# Patient Record
Sex: Female | Born: 1945
Health system: Southern US, Community
[De-identification: ages and names within clinical notes are randomized; demographics above are authoritative.]

## PROBLEM LIST (undated history)

## (undated) DIAGNOSIS — I8390 Asymptomatic varicose veins of unspecified lower extremity: Secondary | ICD-10-CM

## (undated) DIAGNOSIS — Z8489 Family history of other specified conditions: Secondary | ICD-10-CM

## (undated) DIAGNOSIS — Z9289 Personal history of other medical treatment: Secondary | ICD-10-CM

## (undated) DIAGNOSIS — E785 Hyperlipidemia, unspecified: Secondary | ICD-10-CM

## (undated) DIAGNOSIS — M810 Age-related osteoporosis without current pathological fracture: Secondary | ICD-10-CM

## (undated) DIAGNOSIS — I5189 Other ill-defined heart diseases: Secondary | ICD-10-CM

## (undated) DIAGNOSIS — M1990 Unspecified osteoarthritis, unspecified site: Secondary | ICD-10-CM

## (undated) DIAGNOSIS — I10 Essential (primary) hypertension: Secondary | ICD-10-CM

## (undated) DIAGNOSIS — K219 Gastro-esophageal reflux disease without esophagitis: Secondary | ICD-10-CM

## (undated) DIAGNOSIS — T7840XA Allergy, unspecified, initial encounter: Secondary | ICD-10-CM

## (undated) HISTORY — DX: Personal history of other medical treatment: Z92.89

## (undated) HISTORY — DX: Other ill-defined heart diseases: I51.89

## (undated) HISTORY — PX: FRACTURE SURGERY: SHX138

## (undated) HISTORY — DX: Age-related osteoporosis without current pathological fracture: M81.0

## (undated) HISTORY — PX: TOOTH EXTRACTION: SHX859

## (undated) HISTORY — PX: DILATION AND CURETTAGE OF UTERUS: SHX78

## (undated) HISTORY — PX: ABDOMINAL HYSTERECTOMY: SHX81

## (undated) HISTORY — PX: TUBAL LIGATION: SHX77

## (undated) HISTORY — DX: Hyperlipidemia, unspecified: E78.5

## (undated) HISTORY — PX: BACK SURGERY: SHX140

## (undated) HISTORY — DX: Allergy, unspecified, initial encounter: T78.40XA

---

## 1997-12-08 ENCOUNTER — Other Ambulatory Visit: Admission: RE | Admit: 1997-12-08 | Discharge: 1997-12-08 | Payer: Self-pay | Admitting: Gynecology

## 1998-03-08 ENCOUNTER — Ambulatory Visit (HOSPITAL_COMMUNITY): Admission: RE | Admit: 1998-03-08 | Discharge: 1998-03-08 | Payer: Self-pay | Admitting: Gastroenterology

## 2000-01-22 ENCOUNTER — Other Ambulatory Visit: Admission: RE | Admit: 2000-01-22 | Discharge: 2000-01-22 | Payer: Self-pay | Admitting: Gynecology

## 2000-09-19 ENCOUNTER — Encounter: Admission: RE | Admit: 2000-09-19 | Discharge: 2000-09-19 | Payer: Self-pay | Admitting: Family Medicine

## 2000-09-19 ENCOUNTER — Encounter: Payer: Self-pay | Admitting: Family Medicine

## 2000-09-25 ENCOUNTER — Ambulatory Visit (HOSPITAL_COMMUNITY): Admission: RE | Admit: 2000-09-25 | Discharge: 2000-09-25 | Payer: Self-pay | Admitting: Neurosurgery

## 2000-09-25 ENCOUNTER — Encounter: Payer: Self-pay | Admitting: Neurosurgery

## 2000-09-29 ENCOUNTER — Encounter: Payer: Self-pay | Admitting: Neurosurgery

## 2000-09-29 ENCOUNTER — Inpatient Hospital Stay (HOSPITAL_COMMUNITY): Admission: RE | Admit: 2000-09-29 | Discharge: 2000-10-03 | Payer: Self-pay | Admitting: Neurosurgery

## 2001-05-27 ENCOUNTER — Encounter: Payer: Self-pay | Admitting: Gastroenterology

## 2001-05-27 ENCOUNTER — Encounter: Admission: RE | Admit: 2001-05-27 | Discharge: 2001-05-27 | Payer: Self-pay | Admitting: Gastroenterology

## 2002-02-13 IMAGING — CR DG CHEST 2V
2 series · 2 of 2 positions shown · non-contrast
Comparison: none

CLINICAL DATA: Patient has a cough and bronchitis.
 CHEST, TWO VIEWS
 PA and lateral views reveal the heart size to be normal.  Markings are accentuated, particularly in the left base, without active findings.  Old healed fracture noted of the right clavicle. 
 IMPRESSION
 No active disease.

[view not recorded (1 of 2)]
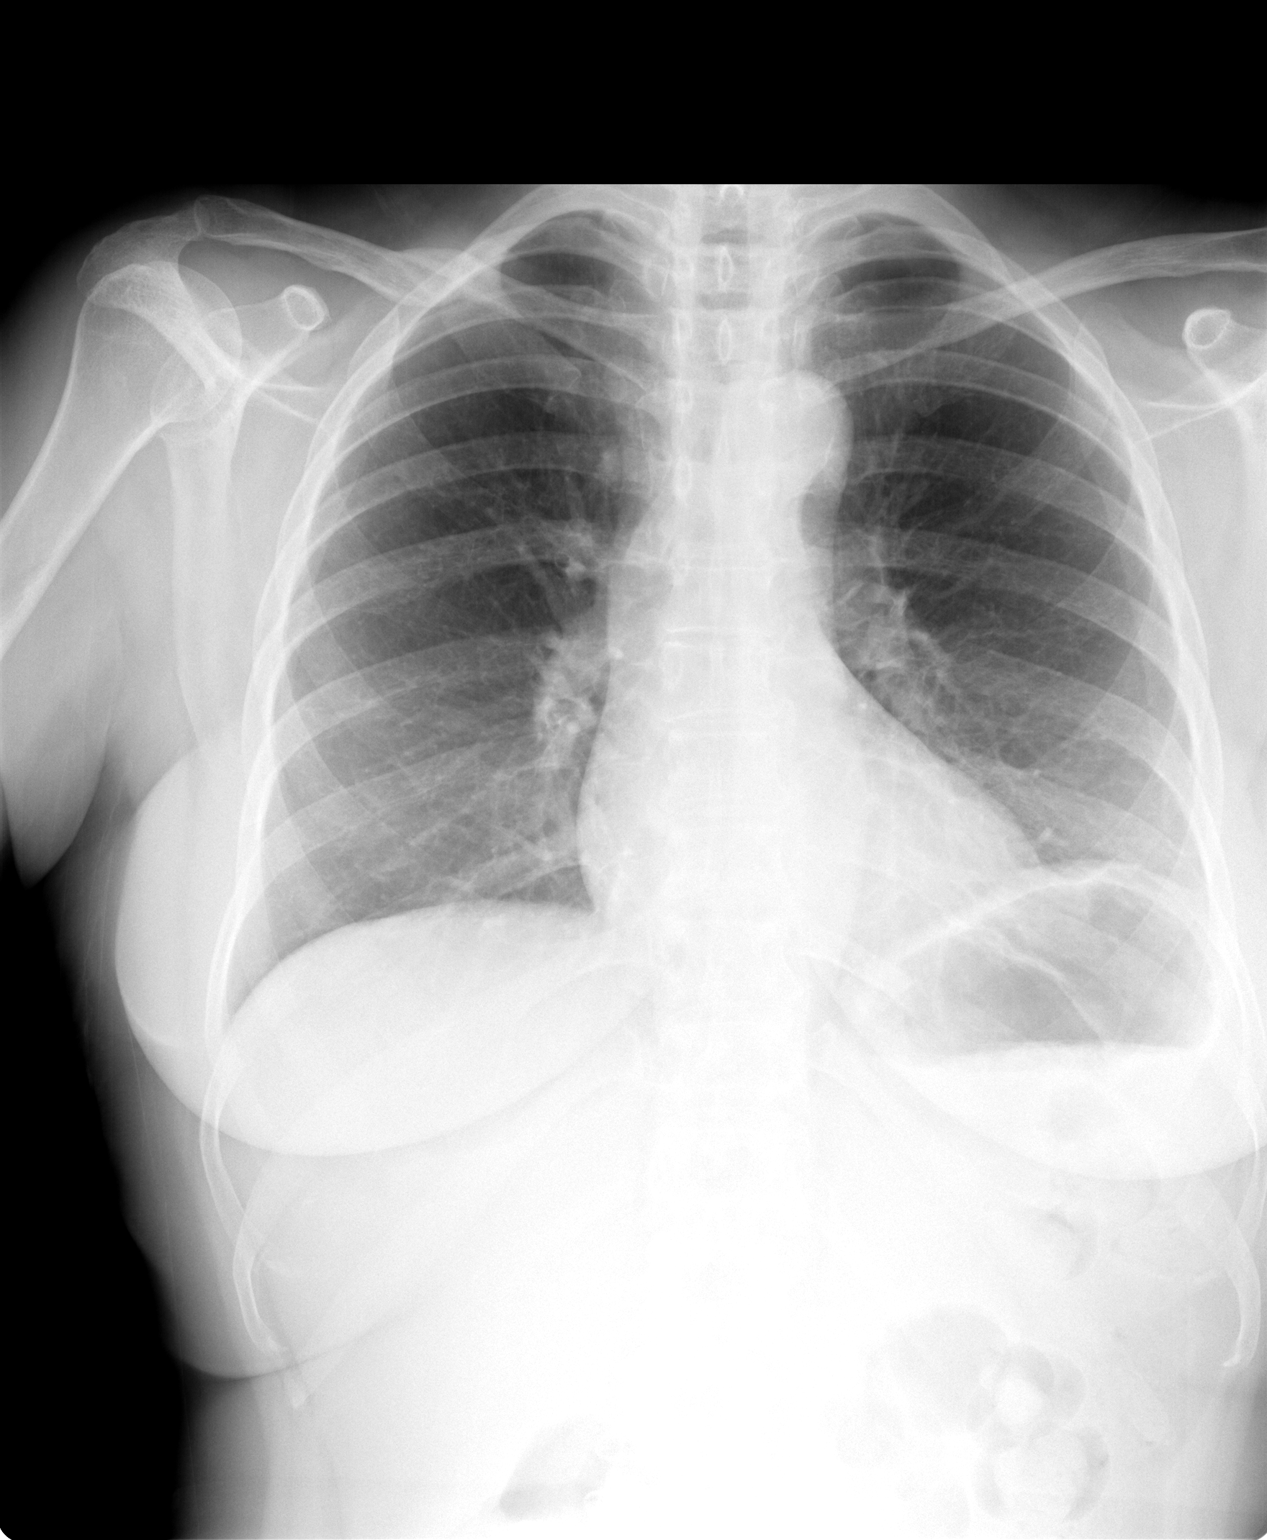

[view not recorded (2 of 2)]
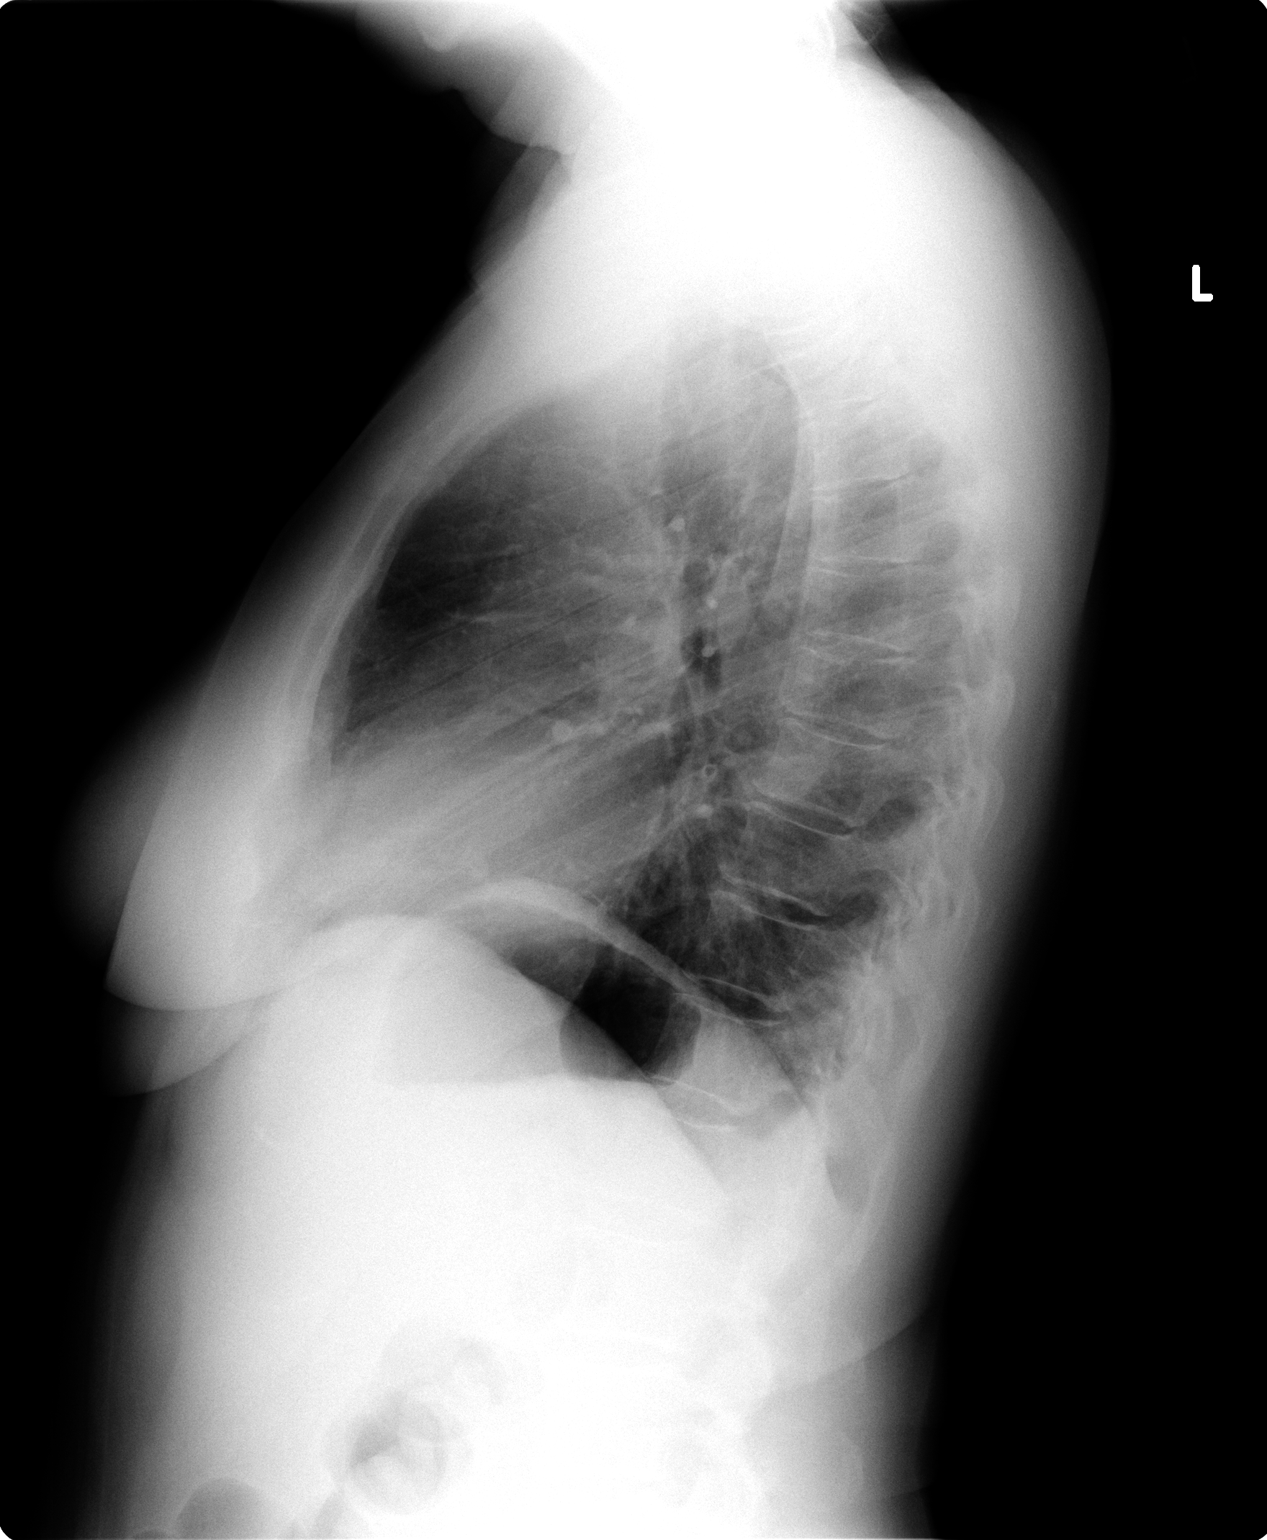

[2 of 2 positions shown; findings below may reference images not displayed]

## 2003-04-11 ENCOUNTER — Other Ambulatory Visit: Admission: RE | Admit: 2003-04-11 | Discharge: 2003-04-11 | Payer: Self-pay | Admitting: Gynecology

## 2003-05-27 ENCOUNTER — Emergency Department (HOSPITAL_COMMUNITY): Admission: EM | Admit: 2003-05-27 | Discharge: 2003-05-27 | Payer: Self-pay | Admitting: Emergency Medicine

## 2003-05-27 ENCOUNTER — Encounter: Payer: Self-pay | Admitting: Emergency Medicine

## 2003-06-01 ENCOUNTER — Emergency Department (HOSPITAL_COMMUNITY): Admission: EM | Admit: 2003-06-01 | Discharge: 2003-06-01 | Payer: Self-pay | Admitting: Emergency Medicine

## 2003-07-15 ENCOUNTER — Encounter: Admission: RE | Admit: 2003-07-15 | Discharge: 2003-08-10 | Payer: Self-pay | Admitting: Nurse Practitioner

## 2003-07-27 ENCOUNTER — Encounter: Admission: RE | Admit: 2003-07-27 | Discharge: 2003-07-27 | Payer: Self-pay | Admitting: Family Medicine

## 2003-10-10 ENCOUNTER — Other Ambulatory Visit: Admission: RE | Admit: 2003-10-10 | Discharge: 2003-10-10 | Payer: Self-pay | Admitting: Gynecology

## 2003-11-03 ENCOUNTER — Encounter: Admission: RE | Admit: 2003-11-03 | Discharge: 2003-11-03 | Payer: Self-pay | Admitting: Family Medicine

## 2004-01-24 ENCOUNTER — Encounter: Admission: RE | Admit: 2004-01-24 | Discharge: 2004-01-24 | Payer: Self-pay | Admitting: Family Medicine

## 2004-04-16 ENCOUNTER — Other Ambulatory Visit: Admission: RE | Admit: 2004-04-16 | Discharge: 2004-04-16 | Payer: Self-pay | Admitting: Gynecology

## 2009-01-02 ENCOUNTER — Inpatient Hospital Stay: Payer: Self-pay | Admitting: *Deleted

## 2009-01-07 ENCOUNTER — Emergency Department: Payer: Self-pay | Admitting: Unknown Physician Specialty

## 2009-01-07 IMAGING — CR DG CHEST 2V
1 series · 2 of 2 positions shown · non-contrast
Comparison: none

REASON FOR EXAM: cp
COMMENTS:

[Series 1: view not recorded · 0.17mm/px · 2 of 2 slices shown]
[im 1/2]
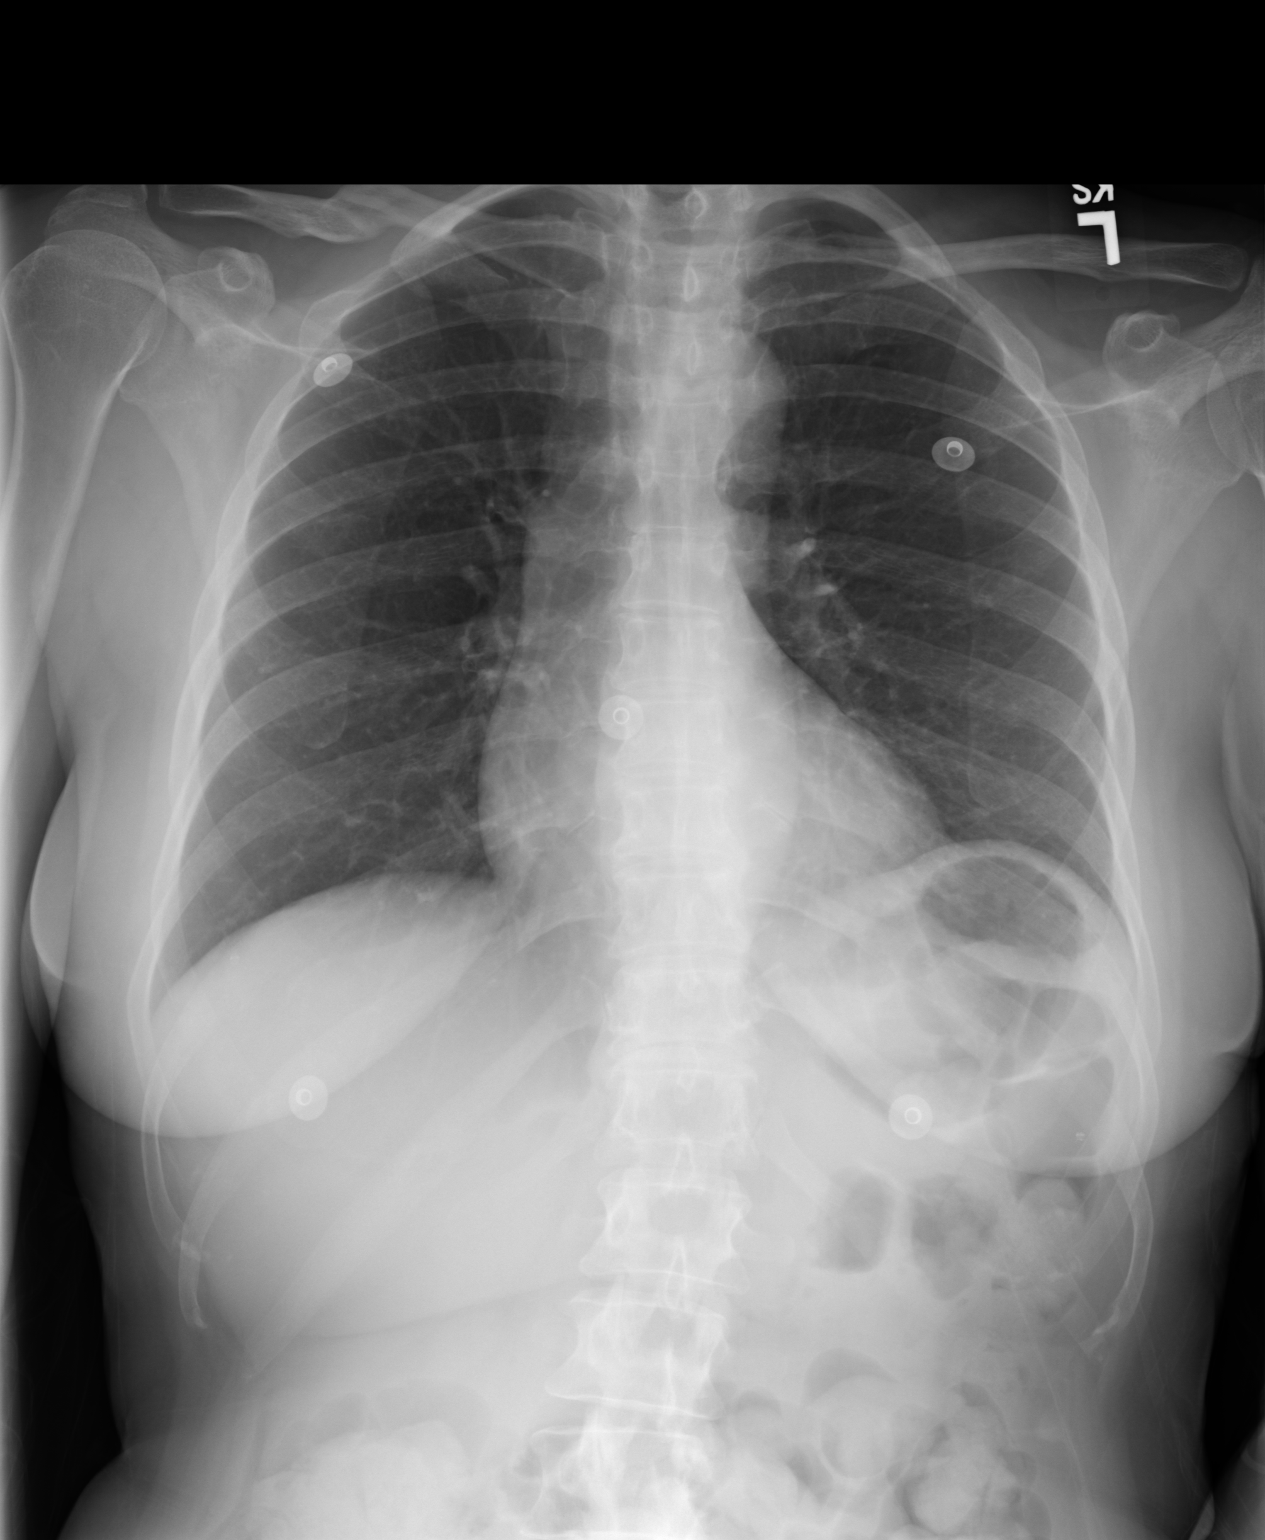
[im 2/2]
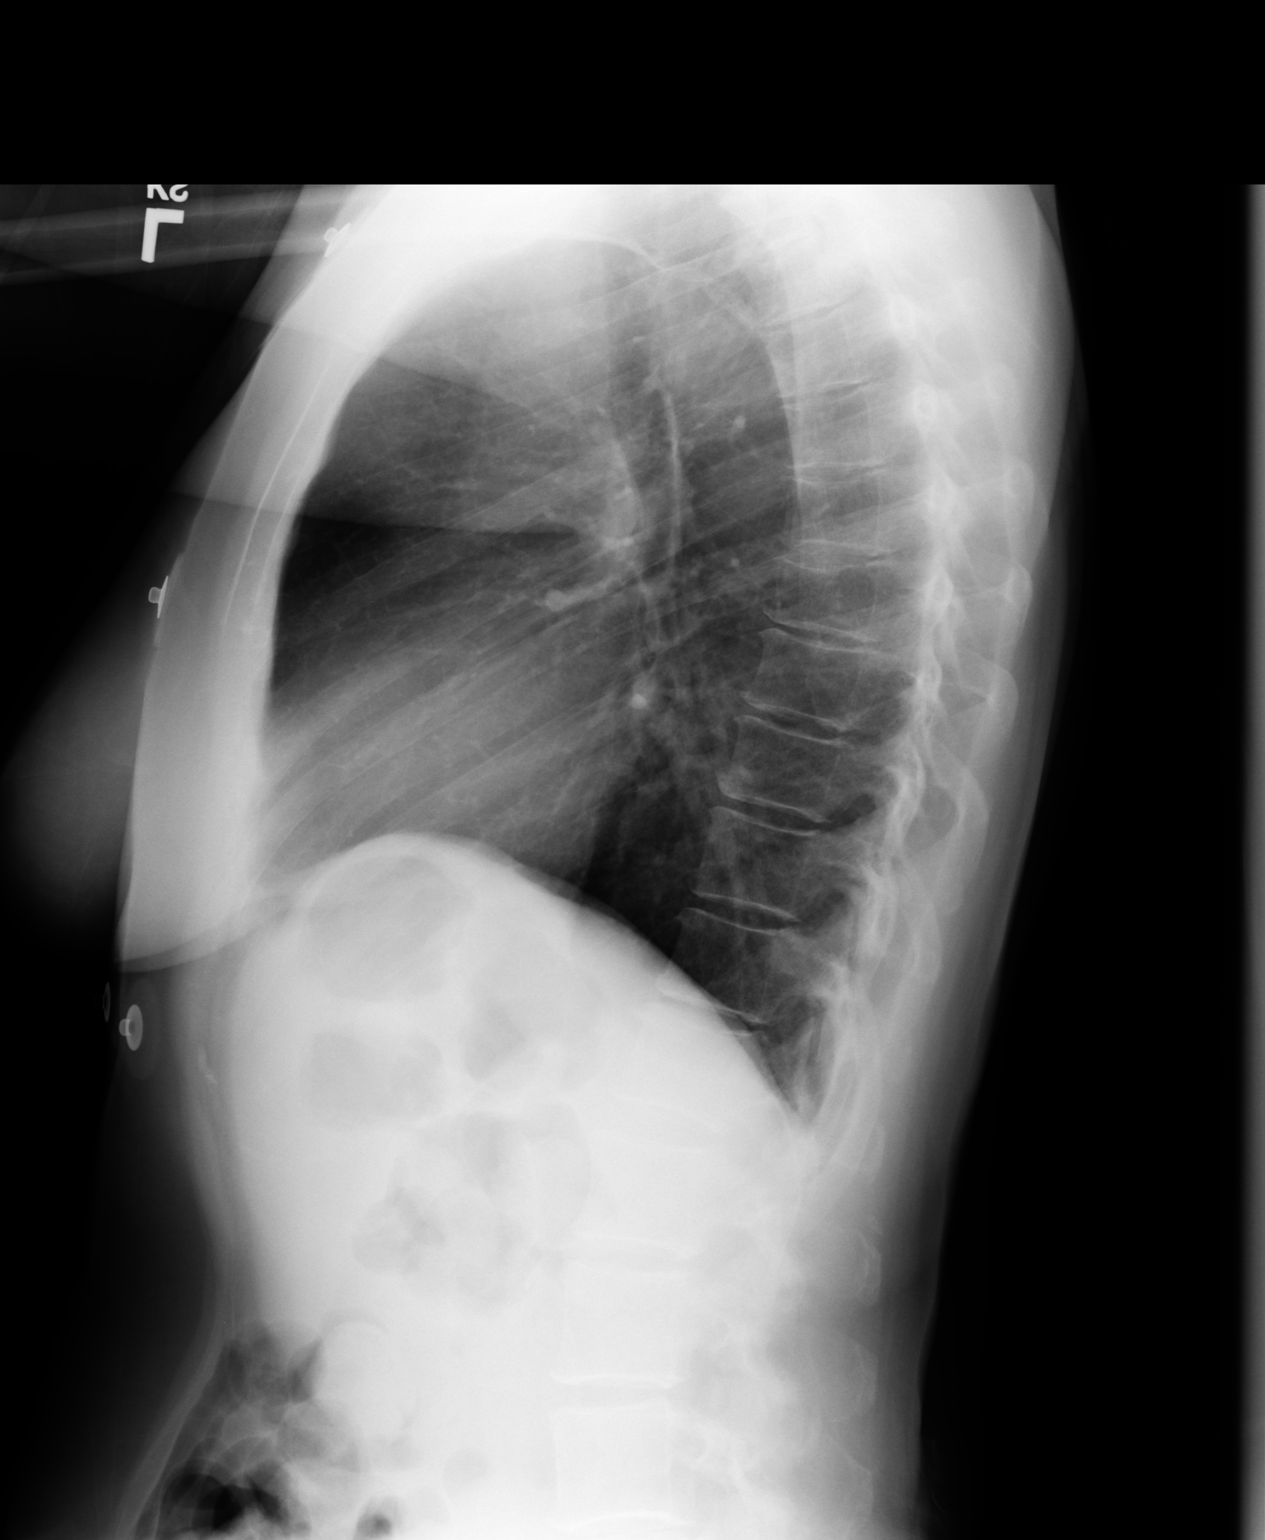

[2 of 2 positions shown; findings below may reference images not displayed]

PROCEDURE:     DXR - DXR CHEST PA (OR AP) AND LATERAL  - [DATE]  [DATE]

RESULT:     There is no previous exam for comparison.

The lungs are clear. The heart and pulmonary vessels are normal. The bony
and mediastinal structures are unremarkable. There is no effusion. There is
no pneumothorax or evidence of congestive failure.
IMPRESSION: No acute cardiopulmonary disease.

## 2009-12-29 IMAGING — CR DG FEMUR 2V*L*
1 series · 4 of 4 positions shown · non-contrast
Comparison: none

REASON FOR EXAM: left upper pain, leg mass
COMMENTS:

PROCEDURE:     DXR - DXR FEMUR LEFT  - [DATE]  [DATE]
RESULT:     AP and lateral views of the left femur demonstrate no evidence
of fracture, dislocation or periosteal reaction. No bony destruction is
evident.

[Series 1: t femur proximal ap left · 0.14mm/px · 4 of 4 slices shown]
[im 1/4]
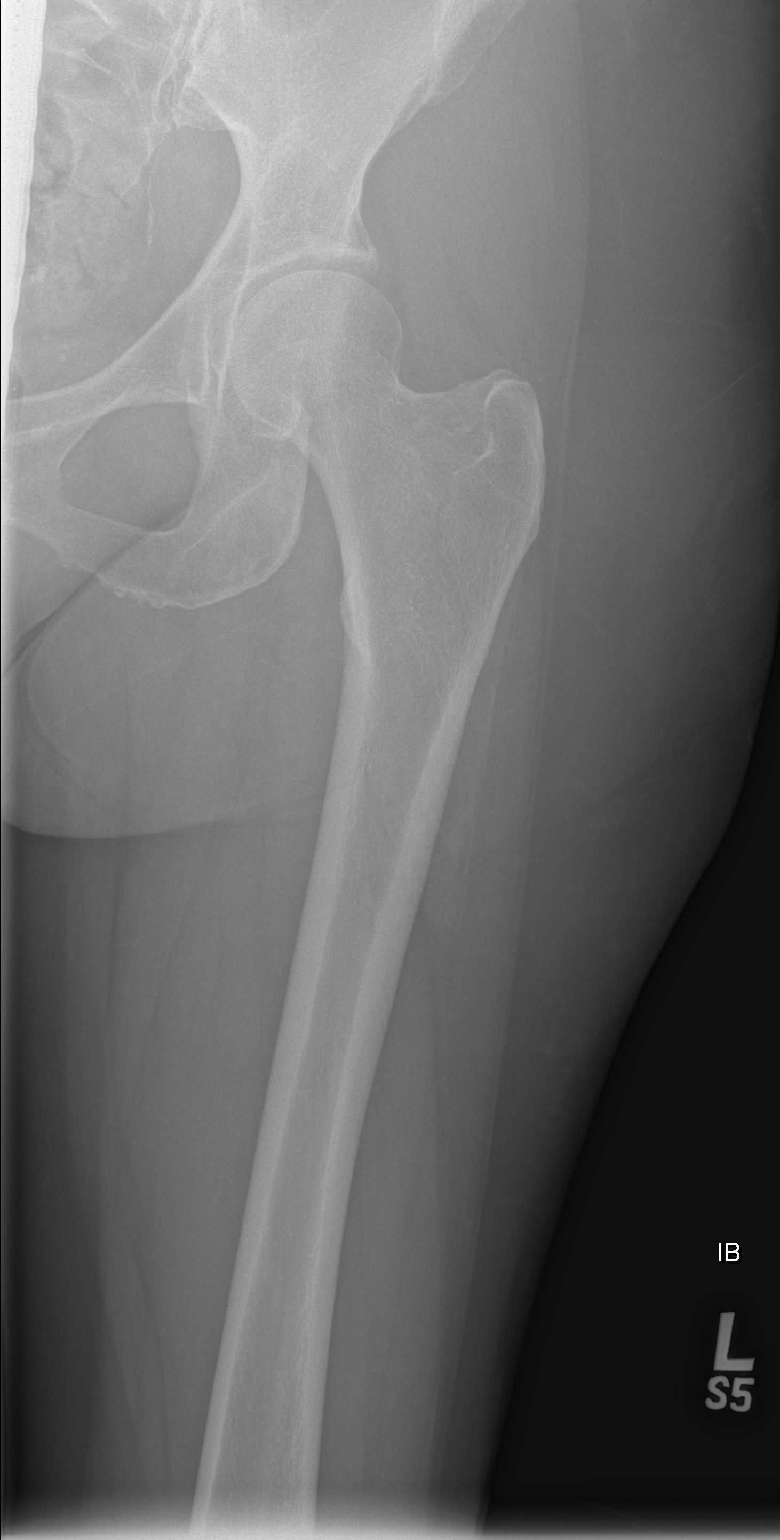
[im 2/4]
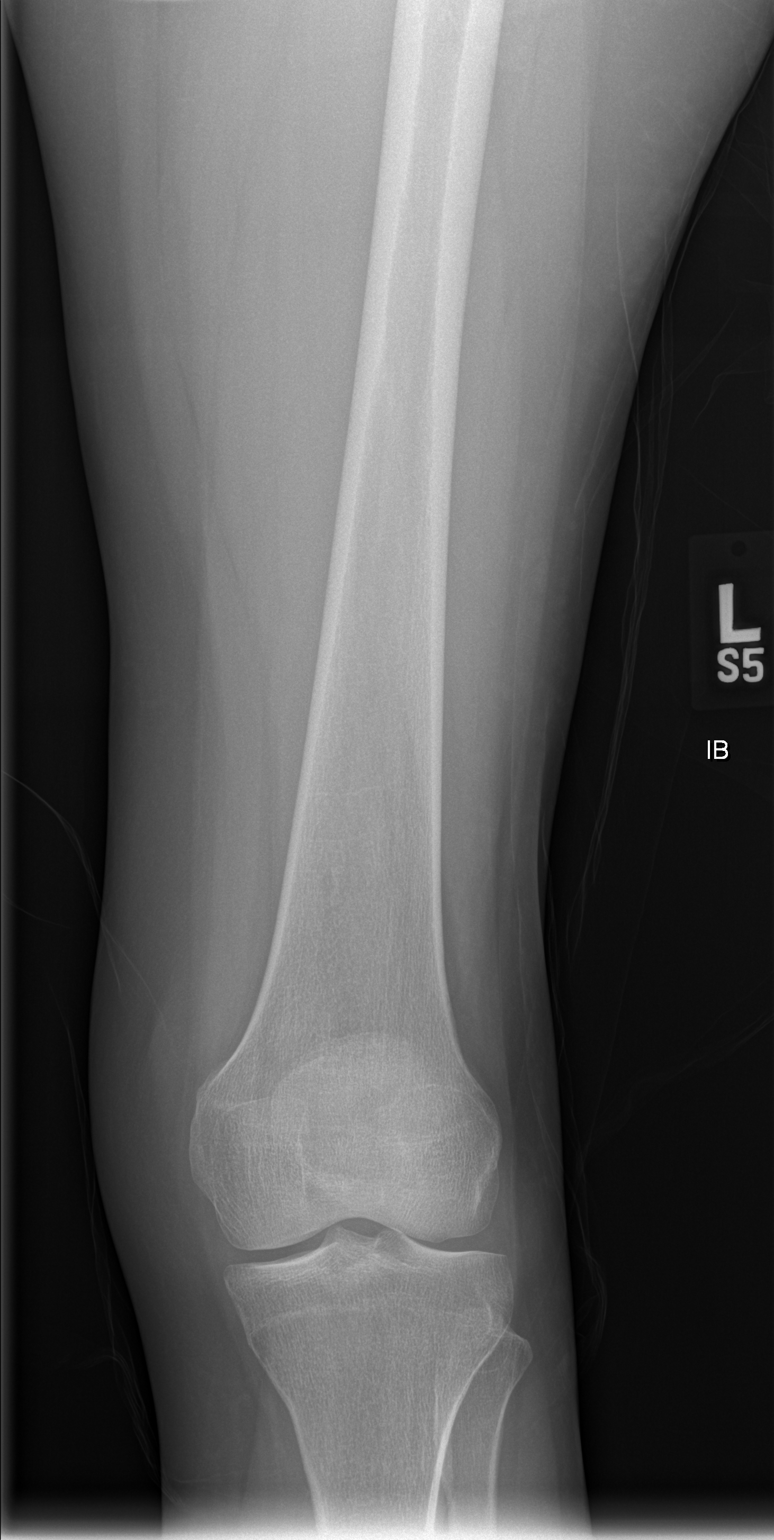
[im 3/4]
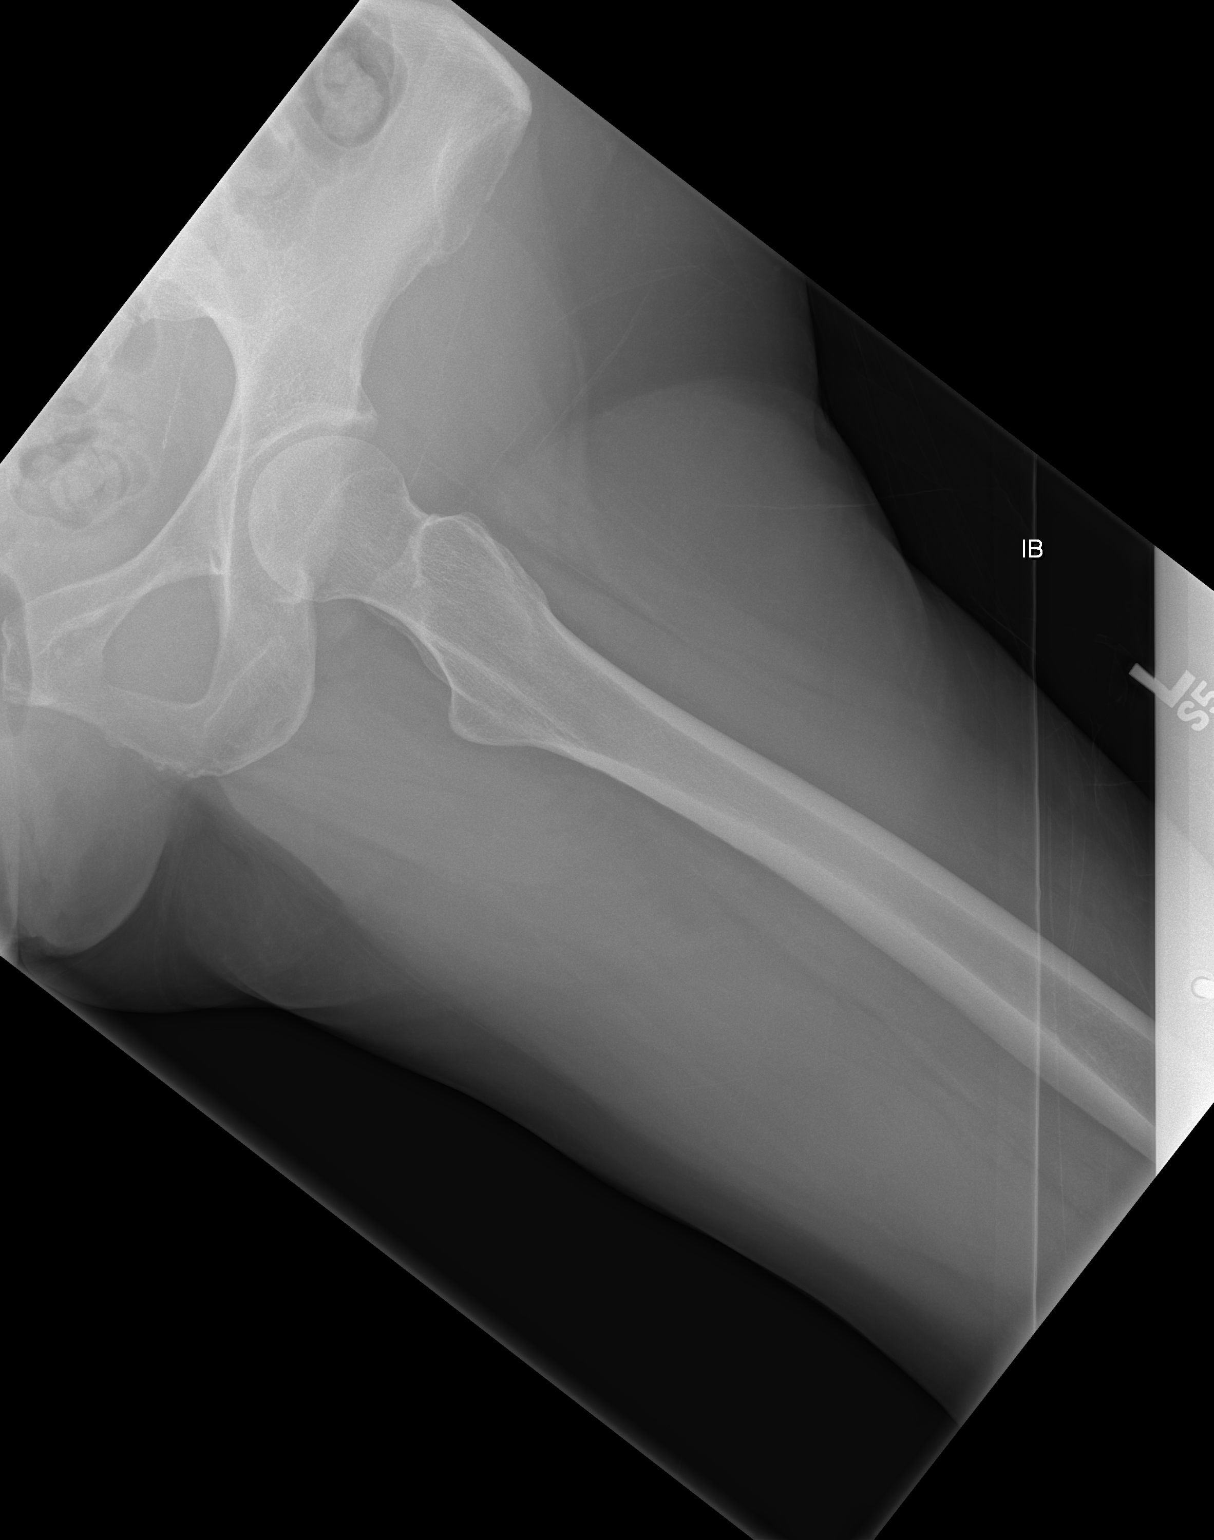
[im 4/4]
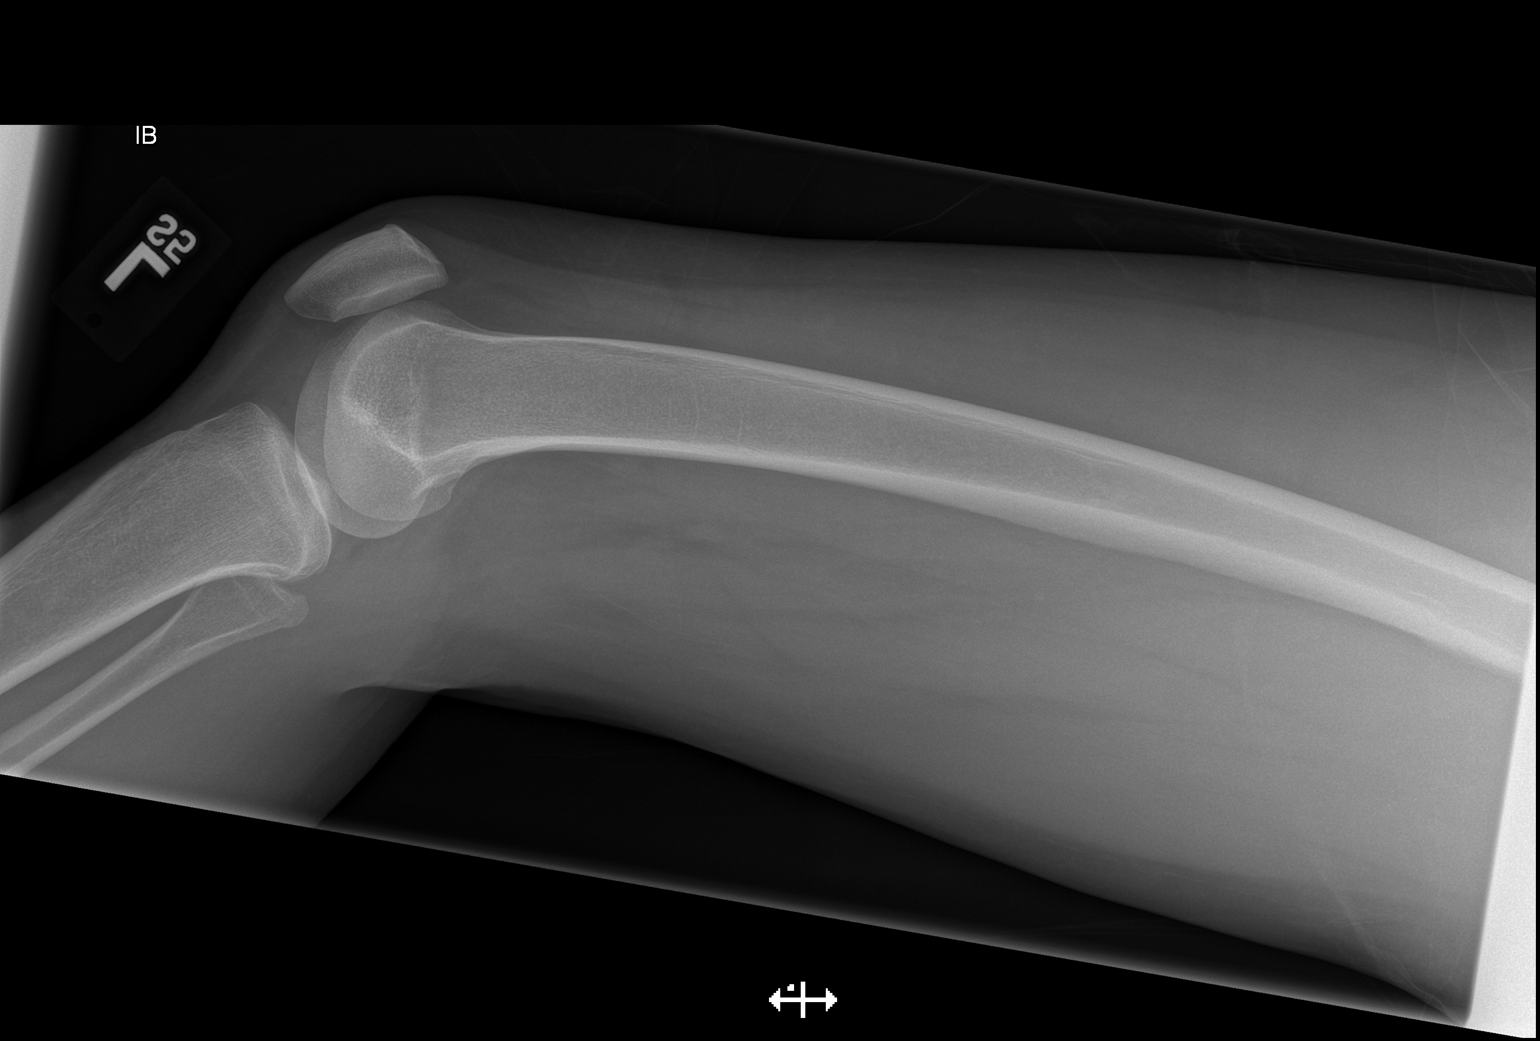

[4 of 4 positions shown; findings below may reference images not displayed]

IMPRESSION: Please see above.

## 2011-09-18 ENCOUNTER — Ambulatory Visit: Payer: Self-pay

## 2013-10-05 ENCOUNTER — Ambulatory Visit: Payer: Self-pay | Admitting: Family Medicine

## 2013-10-15 DIAGNOSIS — M5417 Radiculopathy, lumbosacral region: Secondary | ICD-10-CM | POA: Insufficient documentation

## 2013-10-15 DIAGNOSIS — M545 Low back pain, unspecified: Secondary | ICD-10-CM | POA: Insufficient documentation

## 2014-10-28 DIAGNOSIS — M792 Neuralgia and neuritis, unspecified: Secondary | ICD-10-CM | POA: Insufficient documentation

## 2014-10-28 DIAGNOSIS — M5416 Radiculopathy, lumbar region: Secondary | ICD-10-CM | POA: Insufficient documentation

## 2014-11-17 ENCOUNTER — Ambulatory Visit: Payer: Self-pay | Admitting: Family Medicine

## 2014-11-17 IMAGING — MG MM DIGITAL SCREENING BILAT W/ TOMO W/ CAD
9 of 14 series · 9 of 30 positions shown · non-contrast
Comparison: Previous exam(s).

CLINICAL DATA: Screening.

EXAM:
DIGITAL SCREENING BILATERAL MAMMOGRAM WITH 3D TOMO WITH CAD

[L MLO (1 of 2)]
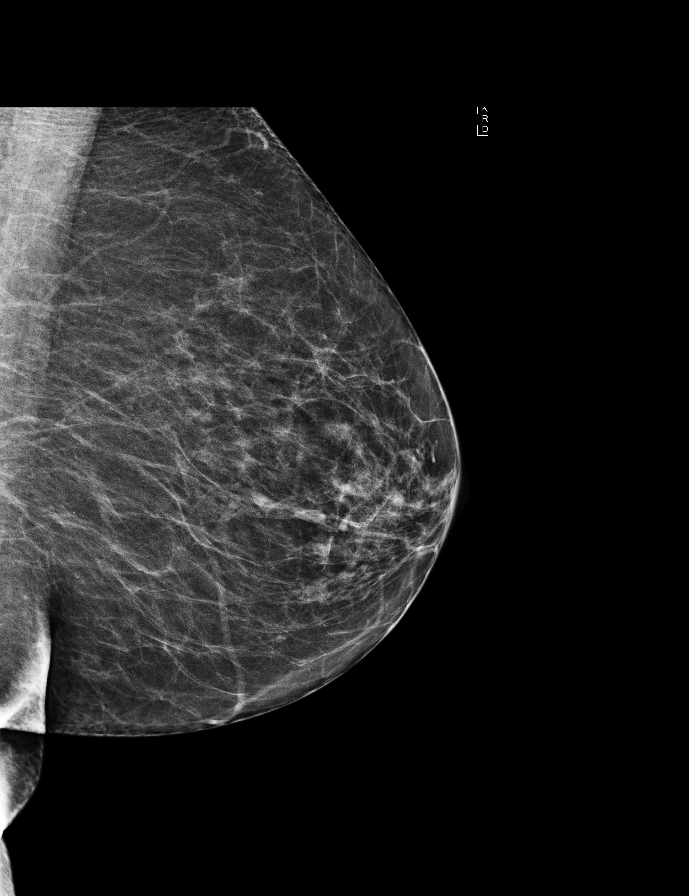

[R MLO (1 of 2)]
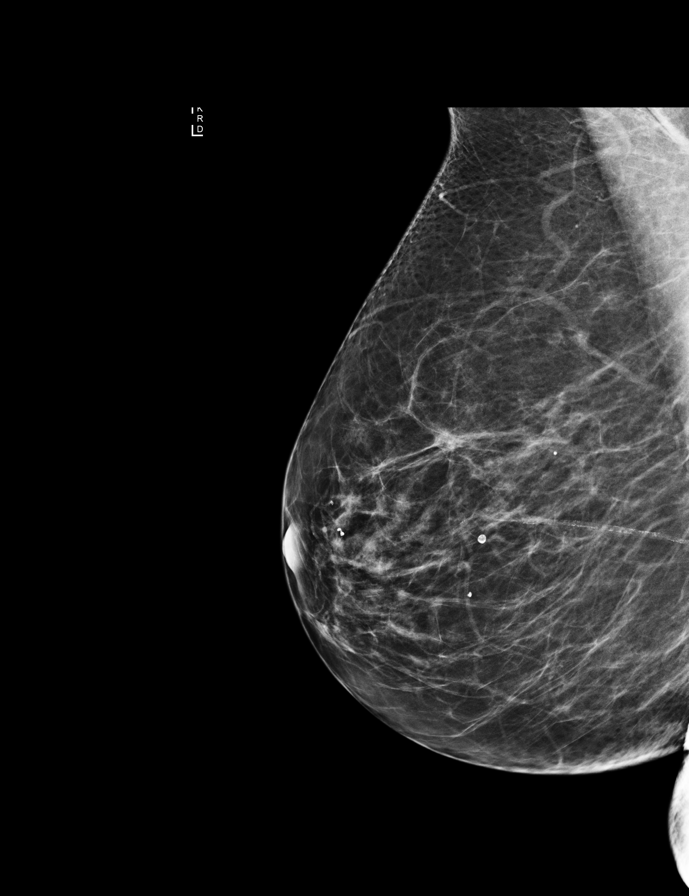

[R MLO synth-2D]
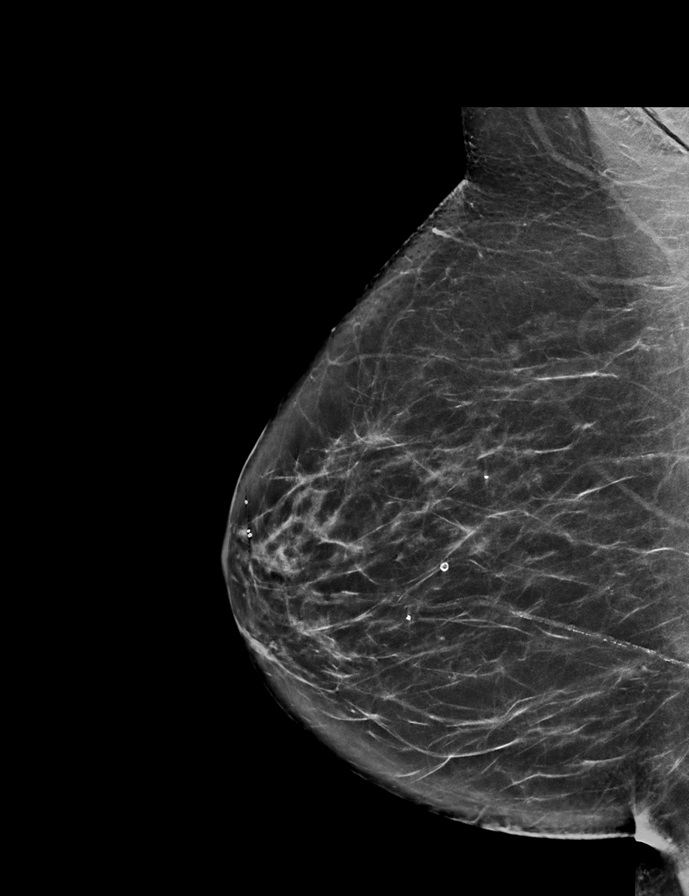

[R CC synth-2D]
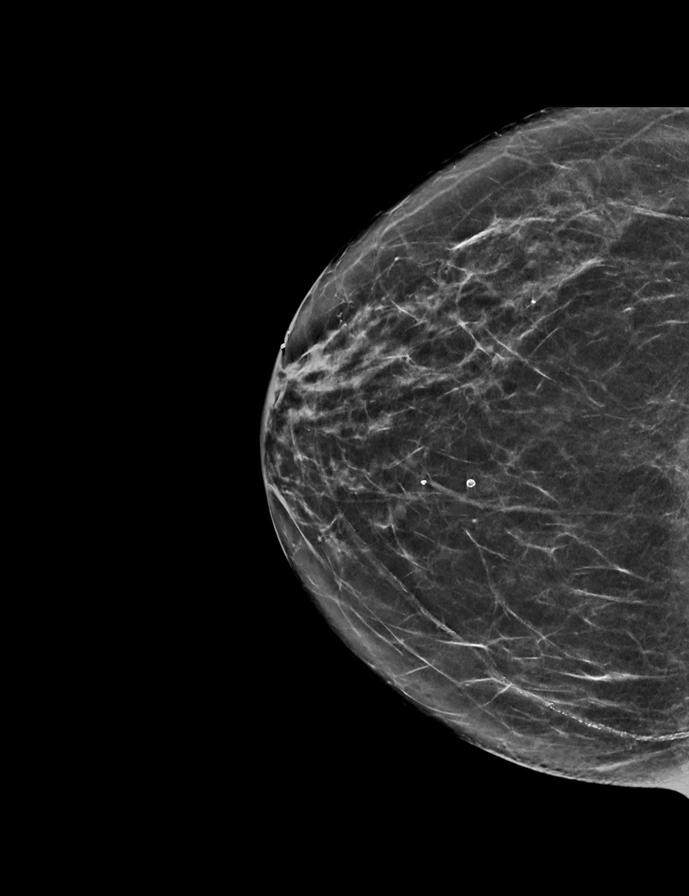

[L MLO (2 of 2)]
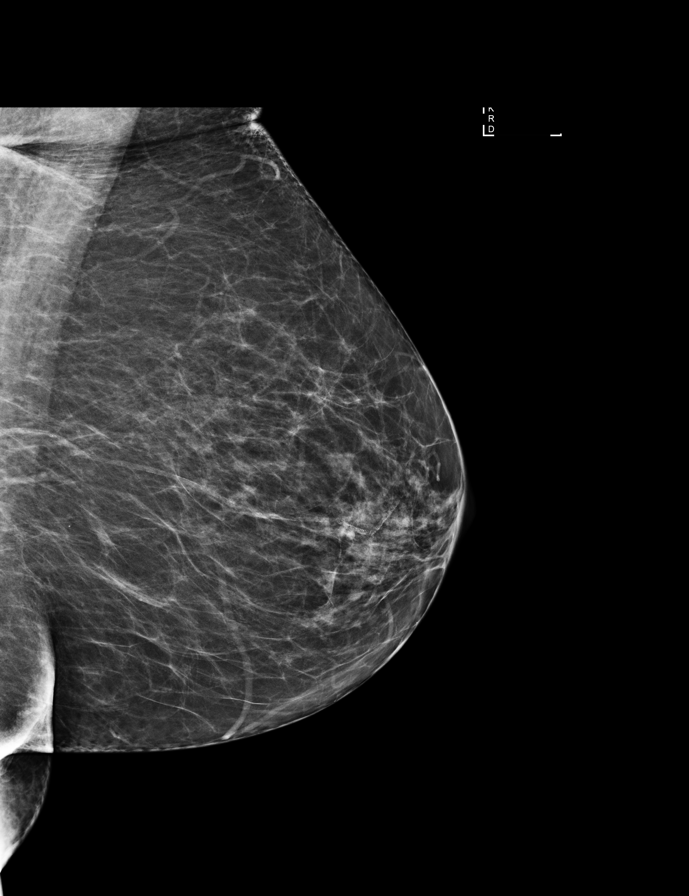

[L CC synth-2D]
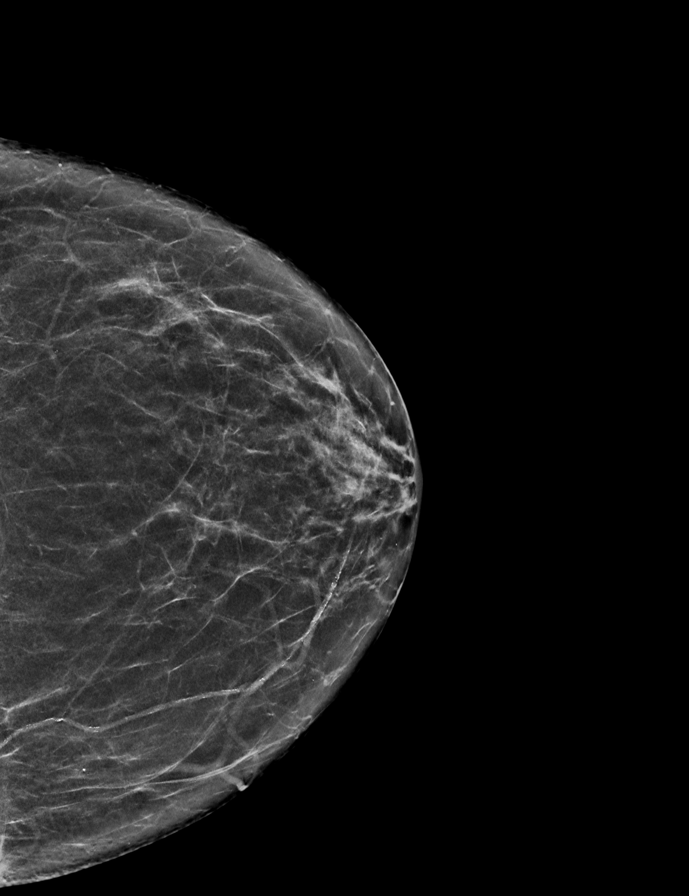

[R MLO (2 of 2)]
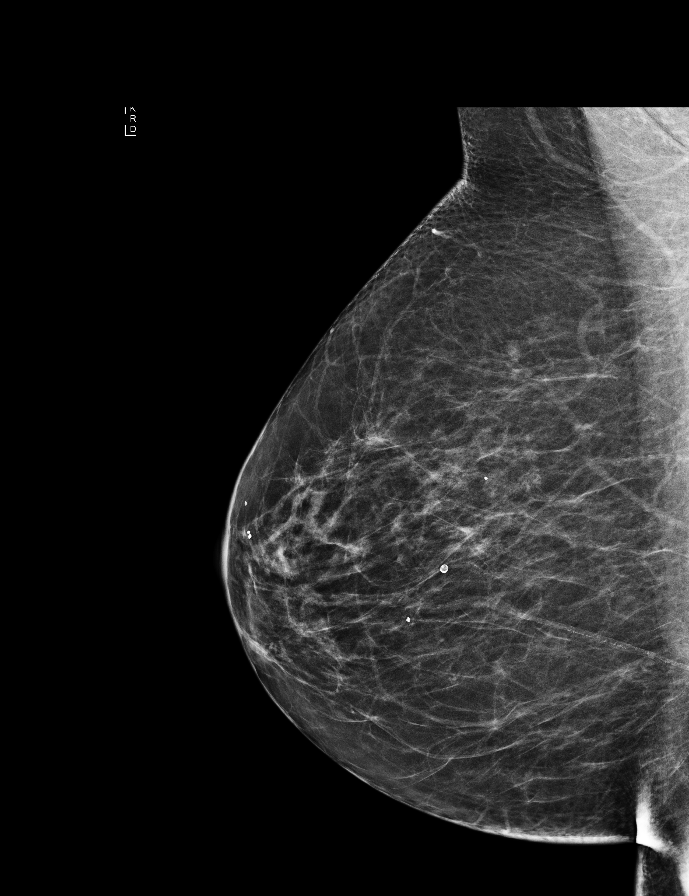

[L MLO synth-2D]
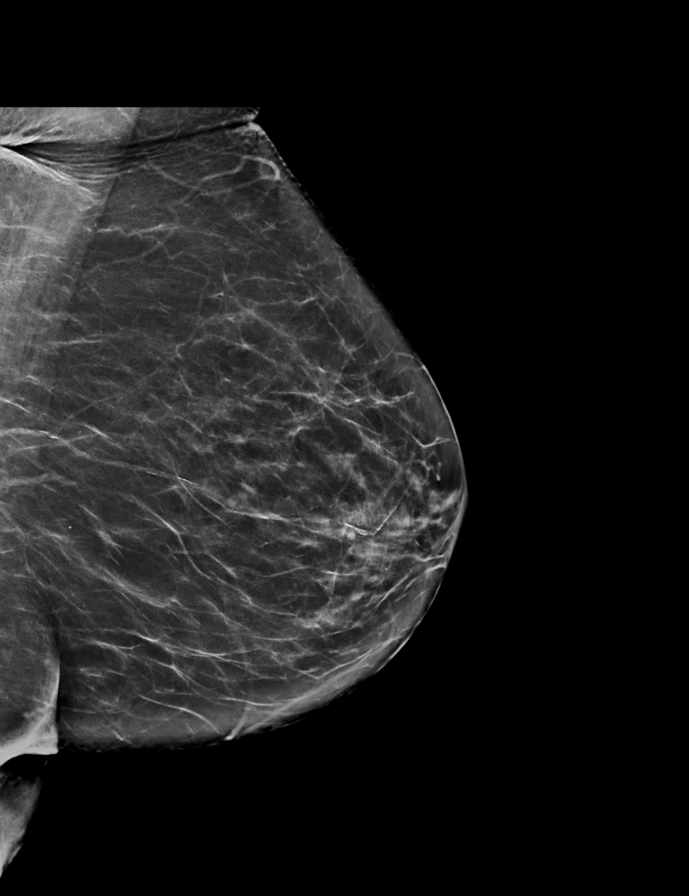

[R CC]
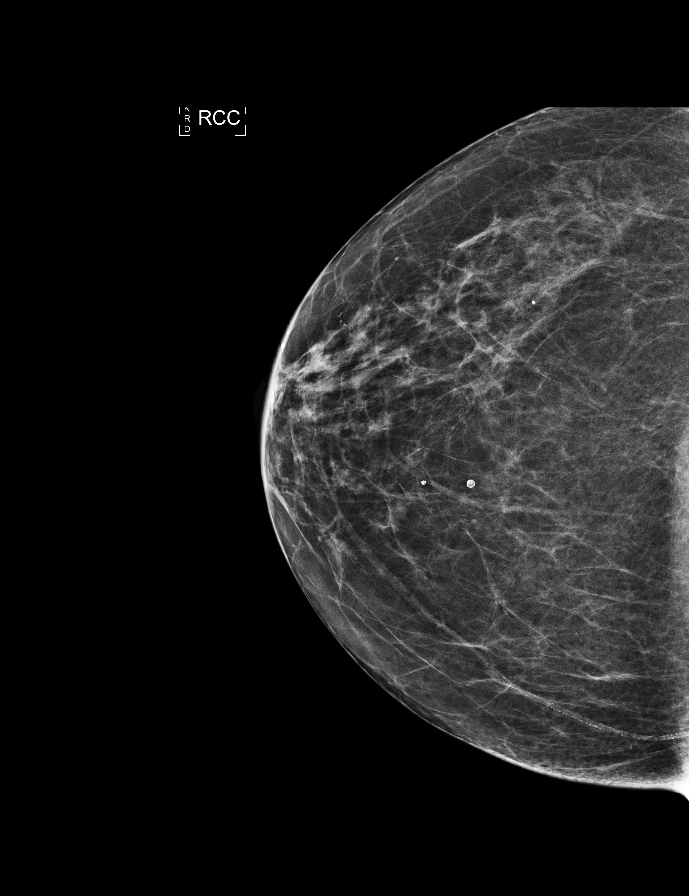

[9 of 30 positions shown; findings below may reference images not displayed]

ACR Breast Density Category b: There are scattered areas of
fibroglandular density.
FINDINGS: There are no findings suspicious for malignancy. Images were
processed with CAD.
IMPRESSION: No mammographic evidence of malignancy. A result letter of this
screening mammogram will be mailed directly to the patient.

RECOMMENDATION:
Screening mammogram in one year. (Code:[F0])

BI-RADS CATEGORY  1: Negative.

## 2015-01-03 ENCOUNTER — Encounter: Payer: Self-pay | Admitting: *Deleted

## 2015-01-03 NOTE — Anesthesia Preprocedure Evaluation (Deleted)

## 2015-01-05 ENCOUNTER — Encounter
Admission: RE | Disposition: A | Discharge status: Home / Self Care | Payer: Self-pay | Source: Ambulatory Visit | Attending: Gastroenterology

## 2015-01-05 ENCOUNTER — Encounter: Payer: Self-pay | Admitting: Anesthesiology

## 2015-01-05 ENCOUNTER — Ambulatory Visit: Payer: PPO | Admitting: Anesthesiology

## 2015-01-05 ENCOUNTER — Ambulatory Visit
Admission: RE | Admit: 2015-01-05 | Discharge: 2015-01-05 | Disposition: A | Discharge status: Home / Self Care | Payer: PPO | Source: Ambulatory Visit | Attending: Gastroenterology | Admitting: Gastroenterology

## 2015-01-05 DIAGNOSIS — Z9889 Other specified postprocedural states: Secondary | ICD-10-CM | POA: Diagnosis not present

## 2015-01-05 DIAGNOSIS — K640 First degree hemorrhoids: Secondary | ICD-10-CM | POA: Diagnosis not present

## 2015-01-05 DIAGNOSIS — Z79899 Other long term (current) drug therapy: Secondary | ICD-10-CM | POA: Insufficient documentation

## 2015-01-05 DIAGNOSIS — Z86010 Personal history of colonic polyps: Secondary | ICD-10-CM | POA: Diagnosis not present

## 2015-01-05 DIAGNOSIS — M1990 Unspecified osteoarthritis, unspecified site: Secondary | ICD-10-CM | POA: Diagnosis not present

## 2015-01-05 DIAGNOSIS — Z7982 Long term (current) use of aspirin: Secondary | ICD-10-CM | POA: Diagnosis not present

## 2015-01-05 DIAGNOSIS — Z1211 Encounter for screening for malignant neoplasm of colon: Secondary | ICD-10-CM | POA: Diagnosis not present

## 2015-01-05 DIAGNOSIS — Z800 Family history of malignant neoplasm of digestive organs: Secondary | ICD-10-CM | POA: Diagnosis not present

## 2015-01-05 DIAGNOSIS — I10 Essential (primary) hypertension: Secondary | ICD-10-CM | POA: Diagnosis not present

## 2015-01-05 DIAGNOSIS — Z90710 Acquired absence of both cervix and uterus: Secondary | ICD-10-CM | POA: Insufficient documentation

## 2015-01-05 DIAGNOSIS — D120 Benign neoplasm of cecum: Secondary | ICD-10-CM | POA: Insufficient documentation

## 2015-01-05 HISTORY — PX: COLONOSCOPY: SHX5424

## 2015-01-05 HISTORY — DX: Asymptomatic varicose veins of unspecified lower extremity: I83.90

## 2015-01-05 HISTORY — DX: Essential (primary) hypertension: I10

## 2015-01-05 HISTORY — DX: Unspecified osteoarthritis, unspecified site: M19.90

## 2015-01-05 SURGERY — COLONOSCOPY
Anesthesia: Monitor Anesthesia Care | Wound class: Contaminated

## 2015-01-05 MED ORDER — LACTATED RINGERS IV SOLN: INTRAVENOUS | Status: DC

## 2015-01-05 MED ORDER — PROPOFOL 10 MG/ML IV BOLUS
INTRAVENOUS | Status: DC | PRN
  Administered 2015-01-05: 80 mg via INTRAVENOUS
  Administered 2015-01-05 (×2): 40 mg via INTRAVENOUS
  Administered 2015-01-05: 20 mg via INTRAVENOUS
  Administered 2015-01-05: 80 mg via INTRAVENOUS
  Administered 2015-01-05: 30 mg via INTRAVENOUS
  Administered 2015-01-05: 40 mg via INTRAVENOUS

## 2015-01-05 MED ORDER — LACTATED RINGERS IV SOLN
INTRAVENOUS | Status: DC
  Administered 2015-01-05 (×3): via INTRAVENOUS

## 2015-01-05 MED ORDER — SIMETHICONE 40 MG/0.6ML PO SUSP
ORAL | Status: DC | PRN
Start: 2015-01-05 — End: 2015-01-05
  Administered 2015-01-05: 11:00:00

## 2015-01-05 MED ORDER — SODIUM CHLORIDE 0.9 % IV SOLN: INTRAVENOUS | Status: DC

## 2015-01-05 MED ORDER — LACTATED RINGERS IV SOLN
INTRAVENOUS | Status: DC
  Administered 2015-01-05: 10:00:00 via INTRAVENOUS

## 2015-01-05 MED ORDER — LIDOCAINE HCL (CARDIAC) 20 MG/ML IV SOLN
INTRAVENOUS | Status: DC | PRN
  Administered 2015-01-05: 30 mg via INTRAVENOUS

## 2015-01-05 SURGICAL SUPPLY — 27 items
CANISTER SUCT 1200ML W/VALVE (MISCELLANEOUS) ×3 IMPLANT
FCP ESCP3.2XJMB 240X2.8X (MISCELLANEOUS) ×1
FORCEPS BIOP RAD 4 LRG CAP 4 (CUTTING FORCEPS) IMPLANT
FORCEPS BIOP RJ4 240 W/NDL (MISCELLANEOUS) ×3
FORCEPS ESCP3.2XJMB 240X2.8X (MISCELLANEOUS) IMPLANT
GOWN CVR UNV OPN BCK APRN NK (MISCELLANEOUS) ×2 IMPLANT
GOWN ISOL THUMB LOOP REG UNIV (MISCELLANEOUS) ×6
HEMOCLIP INSTINCT (CLIP) IMPLANT
INJECTOR VARIJECT VIN23 (MISCELLANEOUS) IMPLANT
KIT CO2 TUBING (TUBING) ×3 IMPLANT
KIT DEFENDO VALVE AND CONN (KITS) IMPLANT
KIT ENDO PROCEDURE OLY (KITS) ×3 IMPLANT
LIGATOR MULTIBAND 6SHOOTER MBL (MISCELLANEOUS) IMPLANT
MARKER SPOT ENDO TATTOO 5ML (MISCELLANEOUS) IMPLANT
PAD GROUND ADULT SPLIT (MISCELLANEOUS) IMPLANT
SNARE SHORT THROW 13M SML OVAL (MISCELLANEOUS) IMPLANT
SNARE SHORT THROW 30M LRG OVAL (MISCELLANEOUS) IMPLANT
SPOT EX ENDOSCOPIC TATTOO (MISCELLANEOUS)
SUCTION POLY TRAP 4CHAMBER (MISCELLANEOUS) IMPLANT
TRAP SUCTION POLY (MISCELLANEOUS) IMPLANT
TUBING CONN 6MMX3.1M (TUBING)
TUBING SUCTION CONN 0.25 STRL (TUBING) IMPLANT
UNDERPAD 30X60 958B10 (PK) (MISCELLANEOUS) IMPLANT
VALVE BIOPSY ENDO (VALVE) IMPLANT
VARIJECT INJECTOR VIN23 (MISCELLANEOUS)
WATER AUXILLARY (MISCELLANEOUS) IMPLANT
WATER STERILE IRR 500ML POUR (IV SOLUTION) ×3 IMPLANT

## 2015-01-05 NOTE — Anesthesia Procedure Notes (Signed)
Procedure Name: MAC Performed by: Porche Steinberger Pre-anesthesia Checklist: Patient identified, Emergency Drugs available, Suction available, Patient being monitored and Timeout performed Patient Re-evaluated:Patient Re-evaluated prior to inductionOxygen Delivery Method: Nasal cannula       

## 2015-01-05 NOTE — Anesthesia Postprocedure Evaluation (Signed)
  Anesthesia Post-op Note  Patient: Sarah Todd  Procedure(s) Performed: Procedure(s): COLONOSCOPY (N/A)  Anesthesia type:MAC  Patient location: PACU  Post pain: Pain level controlled  Post assessment: Post-op Vital signs reviewed, Patient's Cardiovascular Status Stable, Respiratory Function Stable, Patent Airway and No signs of Nausea or vomiting  Post vital signs: Reviewed and stable  Last Vitals:  Filed Vitals:   01/05/15 1124  BP: 107/66  Pulse: 74  Temp:   Resp: 17    Level of consciousness: awake, alert  and patient cooperative  Complications: No apparent anesthesia complications

## 2015-01-05 NOTE — Transfer of Care (Signed)
Immediate Anesthesia Transfer of Care Note  Patient: Sarah Todd  Procedure(s) Performed: Procedure(s): COLONOSCOPY (N/A)  Patient Location: PACU  Anesthesia Type: MAC  Level of Consciousness: awake, alert  and patient cooperative  Airway and Oxygen Therapy: Patient Spontanous Breathing and Patient connected to supplemental oxygen  Post-op Assessment: Post-op Vital signs reviewed, Patient's Cardiovascular Status Stable, Respiratory Function Stable, Patent Airway and No signs of Nausea or vomiting  Post-op Vital Signs: Reviewed and stable  Complications: No apparent anesthesia complications

## 2015-01-05 NOTE — Op Note (Signed)
[facility] Patient Name: Sarah Todd Procedure Date: 01/05/2015 7:20 AM MRN: [id number] Account #: [id number] Date of Birth: 1945/10/09 Admit Type: Outpatient Age: 69 Room: [facility] OR ROOM 01 Gender: Female Note Status: Finalized Procedure:         Colonoscopy Indications:       High risk colon cancer surveillance: Personal history of                     colonic polyps, Family history of colon cancer in a                     first-degree relative Providers:         Lucilla Lame, MD Referring MD:      Jerrell Belfast, MD (Referring MD) Medicines:         Propofol per Anesthesia Complications:     No immediate complications. Procedure:         Pre-Anesthesia Assessment:                    - Prior to the procedure, a History and Physical was                     performed, and patient medications and allergies were                     reviewed. The patient's tolerance of previous anesthesia                     was also reviewed. The risks and benefits of the procedure                     and the sedation options and risks were discussed with the                     patient. All questions were answered, and informed consent                     was obtained. Prior Anticoagulants: The patient has taken                     no previous anticoagulant or antiplatelet agents. ASA                     Grade Assessment: II - A patient with mild systemic                     disease. After reviewing the risks and benefits, the                     patient was deemed in satisfactory condition to undergo                     the procedure.                    After obtaining informed consent, the colonoscope was                     passed under direct vision. Throughout the procedure, the                     patient's blood pressure, pulse, and oxygen saturations  were monitored continuously. The Olympus CF H180AL                     colonoscope  (S#: [id number]) was introduced through the anus                     and advanced to the the cecum, identified by appendiceal                     orifice and ileocecal valve. The colonoscopy was performed                     without difficulty. The patient tolerated the procedure                     well. The quality of the bowel preparation was excellent. Findings:      The perianal and digital rectal examinations were normal.      A 4 mm polyp was found in the cecum. The polyp was sessile. The polyp       was removed with a cold biopsy forceps. Resection and retrieval were       complete.      Non-bleeding internal hemorrhoids were found during retroflexion. The       hemorrhoids were Grade I (internal hemorrhoids that do not prolapse). Impression:        - One 4 mm polyp in the cecum. Resected and retrieved.                    - Non-bleeding internal hemorrhoids. Recommendation:    - Await pathology results.                    - Repeat colonoscopy in 5 years for surveillance. Procedure Code(s): --- Professional ---                    [phone], Colonoscopy, flexible; with biopsy, single or                     multiple Diagnosis Code(s): --- Professional ---                    Z86.010, Personal history of colonic polyps                    Z80.0, Family history of malignant neoplasm of digestive                     organs                    D12.0, Benign neoplasm of cecum CPT copyright 2014 American Medical Association. All rights reserved. The codes documented in this report are preliminary and upon coder review may  be revised to meet current compliance requirements. Lucilla Lame, MD 01/05/2015 11:20:51 AM This report has been signed electronically. Number of Addenda: 0 Note Initiated On: 01/05/2015 7:20 AM Scope Withdrawal Time: 0 hours 6 minutes 33 seconds  Total Procedure Duration: 0 hours 19 minutes 54 seconds       [facility]

## 2015-01-05 NOTE — Anesthesia Preprocedure Evaluation (Signed)
Anesthesia Evaluation  Patient identified by MRN, date of birth, ID band  Reviewed: Allergy & Precautions, H&P , NPO status , Patient's Chart, lab work & pertinent test results  Airway Mallampati: II  TM Distance: >3 FB Neck ROM: full    Dental no notable dental hx.    Pulmonary    Pulmonary exam normal       Cardiovascular Exercise Tolerance: Good hypertension, Normal cardiovascular exam    Neuro/Psych    GI/Hepatic   Endo/Other    Renal/GU      Musculoskeletal  (+) Arthritis -,   Abdominal   Peds  Hematology   Anesthesia Other Findings   Reproductive/Obstetrics                             Anesthesia Physical Anesthesia Plan  ASA: II  Anesthesia Plan: MAC   Post-op Pain Management:    Induction:   Airway Management Planned:   Additional Equipment:   Intra-op Plan:   Post-operative Plan:   Informed Consent: I have reviewed the patients History and Physical, chart, labs and discussed the procedure including the risks, benefits and alternatives for the proposed anesthesia with the patient or authorized representative who has indicated his/her understanding and acceptance.     Plan Discussed with: CRNA  Anesthesia Plan Comments:         Anesthesia Quick Evaluation

## 2015-01-05 NOTE — Discharge Instructions (Addendum)
YOU HAD AN ENDOSCOPIC PROCEDURE TODAY: Refer to the procedure report that was given to you for any specific questions about what was found during the examination.  If the procedure report does not answer your questions, please call your gastroenterologist to clarify.  YOU SHOULD EXPECT: Some feelings of bloating in the abdomen. Passage of more gas than usual.  Walking can help get rid of the air that was put into your GI tract during the procedure and reduce the bloating. If you had a lower endoscopy (such as a colonoscopy or flexible sigmoidoscopy) you may notice spotting of blood in your stool or on the toilet paper.   DIET: Your first meal following the procedure should be a light meal and then it is ok to progress to your normal diet.  A half-sandwich or bowl of soup is an example of a good first meal.  Heavy or fried foods are harder to digest and may make you feel nasueas or bloated.  Drink plenty of fluids but you should avoid alcoholic beverages for 24 hours.  ACTIVITY: Your care partner should take you home directly after the procedure.  You should plan to take it easy, moving slowly for the rest of the day.  You can resume normal activity the day after the procedure however you should NOT DRIVE, make legal decisions or use heavy machinery for 24 hours (because of the sedation medicines used during the test).    SYMPTOMS TO REPORT IMMEDIATELY  A gastroenterologist can be reached at any hour.  Please call your doctor's office for any of the following symptoms:   Following lower endoscopy (colonoscopy, flexible sigmoidoscopy)  Excessive amounts of blood in the stool  Significant tenderness, worsening of abdominal pains  Swelling of the abdomen that is new, acute  Fever of 100 or higher  Following upper endoscopy (EGD, EUS, ERCP)  Vomiting of blood or coffee ground material  New, significant abdominal pain  New, significant chest pain or pain under the shoulder blades  Painful or  persistently difficult swallowing  New shortness of breath  Black, tarry-looking stools  FOLLOW UP: If any biopsies were taken you will be contacted by phone or by letter within the next 1-3 weeks.  Call your gastroenterologist if you have not heard about the biopsies in 3 weeks.   Please also call your gastroenterologist's office with any specific questions about appointments or follow up tests.  General Anesthesia, Care After Refer to this sheet in the next few weeks. These instructions provide you with information on caring for yourself after your procedure. Your health care provider may also give you more specific instructions. Your treatment has been planned according to current medical practices, but problems sometimes occur. Call your health care provider if you have any problems or questions after your procedure. WHAT TO EXPECT AFTER THE PROCEDURE After the procedure, it is typical to experience:  Sleepiness.  Nausea and vomiting. HOME CARE INSTRUCTIONS  For the first 24 hours after general anesthesia:  Have a responsible person with you.  Do not drive a car. If you are alone, do not take public transportation.  Do not drink alcohol.  Do not take medicine that has not been prescribed by your health care provider.  Do not sign important papers or make important decisions.  You may resume a normal diet and activities as directed by your health care provider.  Change bandages (dressings) as directed.  If you have questions or problems that seem related to general anesthesia, call  the hospital and ask for the anesthetist or anesthesiologist on call. SEEK MEDICAL CARE IF:  You have nausea and vomiting that continue the day after anesthesia.  You develop a rash. SEEK IMMEDIATE MEDICAL CARE IF:   You have difficulty breathing.  You have chest pain.  You have any allergic problems. Document Released: 11/25/2000 Document Revised: 08/24/2013 Document Reviewed:  03/04/2013 [facility] Patient Information 2015 [place], [place]. This information is not intended to replace advice given to you by your health care provider. Make sure you discuss any questions you have with your health care provider.

## 2015-01-05 NOTE — H&P (Signed)
@LOGO @  Primary Care Physician:  Margarita Rana, MD Primary Gastroenterologist:  Dr. Allen Norris  Pre-Procedure History & Physical: HPI:  Sarah Todd is a 69 y.o. female is here for an colonoscopy.   Past Medical History  Diagnosis Date  . Hypertension   . Varicose veins     Mild  . Arthritis     hands    Past Surgical History  Procedure Laterality Date  . Abdominal hysterectomy    . Back surgery    . Tubal ligation    . Dilation and curettage of uterus      Prior to Admission medications   Medication Sig Start Date End Date Taking? Authorizing Provider  acetaminophen (TYLENOL) 650 MG CR tablet Take 650 mg by mouth every 4 (four) hours as needed for pain.   Yes Historical Provider, MD  aspirin 81 MG tablet Take 81 mg by mouth daily. AM   Yes Historical Provider, MD  Calcium Citrate-Vitamin D (CALCIUM CITRATE + PO) Take 600 mg by mouth daily. AM   Yes Historical Provider, MD  Cholecalciferol (VITAMIN D-3 PO) Take 1,000 Units by mouth daily. AM   Yes Historical Provider, MD  gabapentin (NEURONTIN) 600 MG tablet Take 600 mg by mouth 3 (three) times daily.   Yes Historical Provider, MD  losartan (COZAAR) 50 MG tablet Take 50 mg by mouth daily. AM   Yes Historical Provider, MD  Multiple Vitamin (MULTIVITAMIN) capsule Take 1 capsule by mouth daily. AM   Yes Historical Provider, MD  nystatin-triamcinolone (MYCOLOG II) cream Apply 1 application topically as needed.   Yes Historical Provider, MD  Omega 3 1000 MG CAPS Take 1,000 mg by mouth daily. AM   Yes Historical Provider, MD  Omega 3 1000 MG CAPS Take 2,000 mg by mouth daily. PM   Yes Historical Provider, MD  vitamin C (ASCORBIC ACID) 500 MG tablet Take 500 mg by mouth daily. AM   Yes Historical Provider, MD    Allergies as of 12/12/2014  . (Not on File)    History reviewed. No pertinent family history.  History   Social History  . Marital Status: Married    Spouse Name: N/A  . Number of Children: N/A  . Years of  Education: N/A   Occupational History  . Not on file.   Social History Main Topics  . Smoking status: Never Smoker   . Smokeless tobacco: Not on file  . Alcohol Use: 0.6 oz/week    1 Glasses of wine per week  . Drug Use: Not on file  . Sexual Activity: Not on file   Other Topics Concern  . Not on file   Social History Narrative    Review of Systems: See HPI, otherwise negative ROS  Physical Exam: BP 123/74 mmHg  Pulse 81  Temp(Src) 97.5 F (36.4 C) (Temporal)  Resp 16  Ht 5' 1.75" (1.568 m)  Wt 139 lb (63.05 kg)  BMI 25.64 kg/m2  SpO2 98% General:   Alert,  pleasant and cooperative in NAD Head:  Normocephalic and atraumatic. Neck:  Supple; no masses or thyromegaly. Lungs:  Clear throughout to auscultation.    Heart:  Regular rate and rhythm. Abdomen:  Soft, nontender and nondistended. Normal bowel sounds, without guarding, and without rebound.   Neurologic:  Alert and  oriented x4;  grossly normal neurologically.  Impression/Plan: Sarah Todd is here for an colonoscopy to be performed for Family history of colon cancerand personal history of colon polyps  Risks, benefits, limitations,  and alternatives regarding  colonoscopy have been reviewed with the patient.  Questions have been answered.  All parties agreeable.   [facility], MD  01/05/2015, 10:49 AM

## 2015-01-10 ENCOUNTER — Encounter: Payer: Self-pay | Admitting: Gastroenterology

## 2015-02-03 ENCOUNTER — Encounter: Payer: Self-pay | Admitting: Gastroenterology

## 2015-02-06 ENCOUNTER — Ambulatory Visit (INDEPENDENT_AMBULATORY_CARE_PROVIDER_SITE_OTHER): Payer: PPO | Admitting: Family Medicine

## 2015-02-06 ENCOUNTER — Encounter: Payer: Self-pay | Admitting: Family Medicine

## 2015-02-06 VITALS — BP 118/70 | HR 72 | Temp 97.5°F | Resp 16 | Ht 61.75 in | Wt 144.0 lb

## 2015-02-06 DIAGNOSIS — B3731 Acute candidiasis of vulva and vagina: Secondary | ICD-10-CM | POA: Insufficient documentation

## 2015-02-06 DIAGNOSIS — B356 Tinea cruris: Secondary | ICD-10-CM | POA: Insufficient documentation

## 2015-02-06 DIAGNOSIS — B373 Candidiasis of vulva and vagina: Secondary | ICD-10-CM

## 2015-02-06 DIAGNOSIS — M1990 Unspecified osteoarthritis, unspecified site: Secondary | ICD-10-CM | POA: Insufficient documentation

## 2015-02-06 DIAGNOSIS — S46919A Strain of unspecified muscle, fascia and tendon at shoulder and upper arm level, unspecified arm, initial encounter: Secondary | ICD-10-CM | POA: Insufficient documentation

## 2015-02-06 DIAGNOSIS — M810 Age-related osteoporosis without current pathological fracture: Secondary | ICD-10-CM | POA: Insufficient documentation

## 2015-02-06 DIAGNOSIS — I10 Essential (primary) hypertension: Secondary | ICD-10-CM | POA: Insufficient documentation

## 2015-02-06 DIAGNOSIS — J309 Allergic rhinitis, unspecified: Secondary | ICD-10-CM | POA: Insufficient documentation

## 2015-02-06 LAB — POCT WET PREP (WET MOUNT)

## 2015-02-06 MED ORDER — TERCONAZOLE 0.4 % VA CREA: 1.0000 | TOPICAL_CREAM | Freq: Every day | VAGINAL | Status: DC

## 2015-02-06 NOTE — Progress Notes (Signed)
   Subjective:    Patient ID: Sarah Todd, female    DOB: [birth year], 69 y.o.   MRN: [id number]  Vaginal Itching The patient's primary symptoms include genital itching and a genital rash. The patient's pertinent negatives include no genital lesions, genital odor, vaginal bleeding or vaginal discharge. This is a recurrent problem. The current episode started 1 to 4 weeks ago. The problem has been gradually improving. Associated symptoms include dysuria and rash. Pertinent negatives include no back pain, chills, discolored urine, flank pain, frequency, hematuria or nausea. She has tried antifungals for the symptoms. The treatment provided mild relief. She is postmenopausal.  Patient reports that she took her medication Fluconazole #2 as prescribed two weeks ago 01/23/2015. Patient reports still having some itching and burning after Fluconazole and still using Nystatin . Patient reports that she has been using scented baby wipes to clean, pt D/C baby wipes to see if that would improve her symptoms but didn't notice a difference.    Review of Systems  Constitutional: Negative for chills.  Gastrointestinal: Negative for nausea.  Genitourinary: Positive for dysuria. Negative for frequency, hematuria, flank pain and vaginal discharge.  Musculoskeletal: Negative for back pain.  Skin: Positive for rash.       Objective:   Physical Exam  Constitutional: She appears well-developed and well-nourished.  Genitourinary: There is no rash on the right labia. There is no rash on the left labia. Erythema: mild  Vaginal discharge (milky) found.    Blood pressure 118/70, pulse 72, temperature 97.5 F (36.4 C), temperature source Oral, resp. rate 16, height 5' 1.75" (1.568 m), weight 144 lb (65.318 kg), SpO2 97 %.       Assessment & Plan:   1. Candida vaginitis Still with yeast on wet prep. Will try Terazol for 2 weeks and then reassess.  Call if does worsens or does not improve.   - POCT Wet Prep  [employer])

## 2015-03-29 ENCOUNTER — Telehealth: Payer: Self-pay | Admitting: Family Medicine

## 2015-03-29 MED ORDER — KETOCONAZOLE 2 % EX CREA: 1.0000 "application " | TOPICAL_CREAM | Freq: Every day | CUTANEOUS | Status: DC

## 2015-03-29 NOTE — Telephone Encounter (Signed)
Please review below. Thanks!  

## 2015-03-29 NOTE — Telephone Encounter (Signed)
Pt stated that she is still having trouble with the yeast infection under her breast and wanted to know what else she could try. Pt stated she has been using the Nystatin but it would make her raw so she switched to  Neosporin. Pharmacy: [facility]. Pt would like a call back today if possible. Thanks TNP

## 2015-04-27 ENCOUNTER — Other Ambulatory Visit: Payer: Self-pay

## 2015-04-27 DIAGNOSIS — I10 Essential (primary) hypertension: Secondary | ICD-10-CM

## 2015-04-27 MED ORDER — LOSARTAN POTASSIUM 50 MG PO TABS: 50.0000 mg | ORAL_TABLET | Freq: Every day | ORAL | Status: DC

## 2015-05-19 ENCOUNTER — Ambulatory Visit (INDEPENDENT_AMBULATORY_CARE_PROVIDER_SITE_OTHER): Payer: PPO | Admitting: Family Medicine

## 2015-05-19 ENCOUNTER — Encounter: Payer: Self-pay | Admitting: Family Medicine

## 2015-05-19 VITALS — BP 128/80 | HR 84 | Temp 98.2°F | Resp 16 | Wt 144.0 lb

## 2015-05-19 DIAGNOSIS — B354 Tinea corporis: Secondary | ICD-10-CM | POA: Diagnosis not present

## 2015-05-19 DIAGNOSIS — B373 Candidiasis of vulva and vagina: Secondary | ICD-10-CM

## 2015-05-19 DIAGNOSIS — B3731 Acute candidiasis of vulva and vagina: Secondary | ICD-10-CM

## 2015-05-19 MED ORDER — TERCONAZOLE 0.4 % VA CREA: 1.0000 | TOPICAL_CREAM | Freq: Every day | VAGINAL | Status: DC

## 2015-05-19 MED ORDER — KETOCONAZOLE 2 % EX CREA: 1.0000 "application " | TOPICAL_CREAM | Freq: Every day | CUTANEOUS | Status: DC

## 2015-05-19 MED ORDER — FLUCONAZOLE 150 MG PO TABS: 150.0000 mg | ORAL_TABLET | Freq: Once | ORAL | Status: DC

## 2015-05-19 NOTE — Progress Notes (Signed)
Patient ID: Sarah Todd, female   DOB: [birth year], 69 y.o.   MRN: [id number]         Patient: Sarah Todd Female    DOB: 08/02/1946   69 y.o.   MRN: [id number] Visit Date: 05/19/2015  Today's Provider: Margarita Rana, MD   Chief Complaint  Patient presents with  . Urinary Tract Infection   Subjective:    Urinary Tract Infection  This is a new problem. The current episode started in the past 7 days. The problem has been unchanged. There has been no fever. Associated symptoms include frequency. Pertinent negatives include no chills, discharge, flank pain, hematuria, nausea, sweats, urgency or vomiting.  Rash This is a recurrent problem. The affected locations include the chest (Left Breast). She was exposed to nothing. Pertinent negatives include no diarrhea, fatigue, fever or vomiting. Past treatments include antibiotic cream. The treatment provided moderate relief.       Allergies  Allergen Reactions  . Lisinopril Anaphylaxis  . Shrimp [Shellfish Allergy] Anaphylaxis   Previous Medications   ACETAMINOPHEN (TYLENOL) 650 MG CR TABLET    Take 650 mg by mouth every 4 (four) hours as needed for pain.   ASPIRIN 81 MG TABLET    Take 81 mg by mouth daily. AM   CALCIUM CITRATE 200 MG TABS    Take by mouth.   CHOLECALCIFEROL (VITAMIN D-3 PO)    Take 1,000 Units by mouth daily. AM   ESTRADIOL (ESTRACE) 1 MG TABLET    Take by mouth.   GABAPENTIN (NEURONTIN) 600 MG TABLET    Take 600 mg by mouth 3 (three) times daily.   KETOCONAZOLE (NIZORAL) 2 % CREAM    Apply 1 application topically daily.   LOSARTAN (COZAAR) 50 MG TABLET    Take 1 tablet (50 mg total) by mouth daily. AM   MULTIPLE VITAMIN (MULTIVITAMIN) CAPSULE    Take 1 capsule by mouth daily. AM   NYSTATIN-TRIAMCINOLONE (MYCOLOG II) CREAM    Apply 1 application topically as needed.   OMEGA 3 1000 MG CAPS    Take 1,000 mg by mouth daily. AM   VITAMIN C (ASCORBIC ACID) 500 MG TABLET    Take by mouth.    Review of  Systems  Constitutional: Negative for fever, chills, diaphoresis, activity change, appetite change, fatigue and unexpected weight change.  Gastrointestinal: Negative for nausea, vomiting, abdominal pain, diarrhea, constipation, blood in stool, abdominal distention, anal bleeding and rectal pain.  Genitourinary: Positive for dysuria and frequency. Negative for urgency, hematuria, flank pain, decreased urine volume, vaginal bleeding, vaginal discharge, enuresis, difficulty urinating, genital sores, vaginal pain, menstrual problem, pelvic pain and dyspareunia.  Musculoskeletal: Negative for back pain.  Skin: Positive for rash (Yeast infection under her Left Breast.).    Social History  Substance Use Topics  . Smoking status: Never Smoker   . Smokeless tobacco: Never Used  . Alcohol Use: 0.6 oz/week    1 Glasses of wine per week   Objective:   BP 128/80 mmHg  Pulse 84  Temp(Src) 98.2 F (36.8 C) (Oral)  Resp 16  Wt 144 lb (65.318 kg)  Physical Exam  Constitutional: She is oriented to person, place, and time. She appears well-developed and well-nourished.  Genitourinary: Vagina normal. No vaginal discharge found.  Neurological: She is alert and oriented to person, place, and time.   Results for orders placed or performed in visit on 02/06/15  POCT Wet Prep [facility])  Result Value Ref Range   [employer]  Prep POC VAGINAL    WBC, Wet Prep HPF POC NONE    Bacteria Wet Prep HPF POC FEW    BACTERIA WET PREP MORPHOLOGY POC     Clue Cells Wet Prep HPF POC None    Clue Cells Wet Prep Whiff POC     Yeast Wet Prep HPF POC Many    KOH Wet Prep POC     Trichomonas Wet Prep HPF POC NONE        Assessment & Plan:     1. Candida vaginitis KOH  Positive. Will treat as below. Condition is worsening. Will start medication for better control.  - fluconazole (DIFLUCAN) 150 MG tablet; Take 1 tablet (150 mg total) by mouth once.  Dispense: 1 tablet; Refill: 0 - terconazole (TERAZOL 7) 0.4 %  vaginal cream; Place 1 applicator vaginally at bedtime. FOR 14 DAYS  Dispense: 90 g; Refill: 0  2. Tinea corporis Condition is stable. Please continue current medication and  plan of care as noted.  Encourages patient to use regularly.  - ketoconazole (NIZORAL) 2 % cream; Apply 1 application topically daily.  Dispense: 30 g; Refill: 0     Margarita Rana, MD  [facility]

## 2015-06-20 ENCOUNTER — Ambulatory Visit (INDEPENDENT_AMBULATORY_CARE_PROVIDER_SITE_OTHER): Payer: PPO | Admitting: Family Medicine

## 2015-06-20 ENCOUNTER — Encounter: Payer: Self-pay | Admitting: Family Medicine

## 2015-06-20 VITALS — BP 112/60 | HR 84 | Temp 98.2°F | Resp 16 | Wt 146.0 lb

## 2015-06-20 DIAGNOSIS — K5909 Other constipation: Secondary | ICD-10-CM

## 2015-06-20 DIAGNOSIS — B3731 Acute candidiasis of vulva and vagina: Secondary | ICD-10-CM

## 2015-06-20 DIAGNOSIS — L989 Disorder of the skin and subcutaneous tissue, unspecified: Secondary | ICD-10-CM | POA: Insufficient documentation

## 2015-06-20 DIAGNOSIS — K5900 Constipation, unspecified: Secondary | ICD-10-CM | POA: Insufficient documentation

## 2015-06-20 DIAGNOSIS — B373 Candidiasis of vulva and vagina: Secondary | ICD-10-CM | POA: Diagnosis not present

## 2015-06-20 DIAGNOSIS — B3789 Other sites of candidiasis: Secondary | ICD-10-CM | POA: Diagnosis not present

## 2015-06-20 LAB — POCT GLYCOSYLATED HEMOGLOBIN (HGB A1C): Hemoglobin A1C: 5.5

## 2015-06-20 MED ORDER — SENNOSIDES-DOCUSATE SODIUM 8.6-50 MG PO TABS: 1.0000 | ORAL_TABLET | Freq: Two times a day (BID) | ORAL | Status: DC

## 2015-06-20 MED ORDER — NYSTATIN 100000 UNIT/GM EX POWD: CUTANEOUS | Status: DC

## 2015-06-20 MED ORDER — TERCONAZOLE 0.4 % VA CREA: 1.0000 | TOPICAL_CREAM | Freq: Every day | VAGINAL | Status: DC

## 2015-06-20 MED ORDER — MAGNESIUM CITRATE PO SOLN: 0.5000 | Freq: Once | ORAL | Status: DC

## 2015-06-20 NOTE — Progress Notes (Signed)
Patient: Sarah Todd Female    DOB: April 10, 1946   69 y.o.   MRN: [id number] Visit Date: 06/20/2015  Today's Provider: Margarita Rana, MD   Chief Complaint  Patient presents with  . Vaginitis  . Rash   Subjective:    Rash This is a recurrent problem. The current episode started in the past 7 days. The problem has been gradually worsening since onset. Location: Left breast, under her breast.  The rash is characterized by redness, itchiness and dryness. She was exposed to nothing. Pertinent negatives include no fatigue, fever or nail changes. Past treatments include topical steroids (Fluconazole). The treatment provided mild relief.  Vaginal Itching The patient's pertinent negatives include no pelvic pain, vaginal bleeding or vaginal discharge. This is a recurrent problem. The current episode started in the past 7 days. The problem occurs constantly. The problem has been gradually worsening (Did improve,  now recurs. ). Associated symptoms include rash. Pertinent negatives include no dysuria, fever, flank pain, frequency, headaches, hematuria or urgency. Nothing aggravates the symptoms. She uses nothing for contraception. She is postmenopausal.   Also with lesion on her face.      Allergies  Allergen Reactions  . Lisinopril Anaphylaxis  . Shrimp [Shellfish Allergy] Anaphylaxis   Previous Medications   ACETAMINOPHEN (TYLENOL) 650 MG CR TABLET    Take 650 mg by mouth every 4 (four) hours as needed for pain.   ASPIRIN 81 MG TABLET    Take 81 mg by mouth daily. AM   CALCIUM CITRATE 200 MG TABS    Take by mouth.   CHOLECALCIFEROL (VITAMIN D-3 PO)    Take 1,000 Units by mouth daily. AM   ESTRADIOL (ESTRACE) 1 MG TABLET    Take by mouth.   FLUCONAZOLE (DIFLUCAN) 150 MG TABLET    Take 1 tablet (150 mg total) by mouth once.   GABAPENTIN (NEURONTIN) 600 MG TABLET    Take 600 mg by mouth 3 (three) times daily.   KETOCONAZOLE (NIZORAL) 2 % CREAM    Apply 1 application topically  daily.   LOSARTAN (COZAAR) 50 MG TABLET    Take 1 tablet (50 mg total) by mouth daily. AM   MULTIPLE VITAMIN (MULTIVITAMIN) CAPSULE    Take 1 capsule by mouth daily. AM   OMEGA 3 1000 MG CAPS    Take 1,000 mg by mouth daily. AM   TERCONAZOLE (TERAZOL 7) 0.4 % VAGINAL CREAM    Place 1 applicator vaginally at bedtime. FOR 14 DAYS   VITAMIN C (ASCORBIC ACID) 500 MG TABLET    Take by mouth.    Review of Systems  Constitutional: Negative.  Negative for fever and fatigue.  Genitourinary: Negative for dysuria, urgency, frequency, hematuria, flank pain, decreased urine volume, vaginal bleeding, vaginal discharge, enuresis, difficulty urinating, genital sores, vaginal pain, menstrual problem, pelvic pain and dyspareunia.  Skin: Positive for rash. Negative for nail changes, color change, pallor (Under left breast) and wound.  Neurological: Negative for dizziness, light-headedness and headaches.    Social History  Substance Use Topics  . Smoking status: Never Smoker   . Smokeless tobacco: Never Used  . Alcohol Use: 0.6 oz/week    1 Glasses of wine per week   Objective:   BP 112/60 mmHg  Pulse 84  Temp(Src) 98.2 F (36.8 C) (Oral)  Resp 16  Wt 146 lb (66.225 kg)  Physical Exam  Constitutional: She is oriented to person, place, and time. She appears well-developed and  well-nourished.  Genitourinary: Vagina normal. No vaginal discharge found.  KOH was positive again.   Neurological: She is alert and oriented to person, place, and time.  Skin: Skin is warm. Rash (mild under left breast. ) noted.  Scaly lesion on left cheek.   Psychiatric: She has a normal mood and affect. Her behavior is normal. Judgment and thought content normal.      Assessment & Plan:     1. Candidiasis of breast Worsening.   Will change to nystatin powder two times daily and call if worsens or does not improve.  - nystatin (MYCOSTATIN/NYSTOP) 100000 UNIT/GM POWD; Apply to affected area two times daily.  Dispense: 60  g; Refill: 2  2. Candidiasis of vagina Positive again today. - POCT Wet Prep with KOH   3. Recurrent candidiasis of vagina Will retreat for one month.  Consider Boric acid capsules if does not improve.  - POCT glycosylated hemoglobin (Hb A1C) - terconazole (TERAZOL 7) 0.4 % vaginal cream; Place 1 applicator vaginally at bedtime. FOR 30 days  Dispense: 180 g; Refill:  4. Other constipation Chronic problem Will treat as below and call if does not improve.  - magnesium citrate SOLN; Take 148 mLs (0.5 Bottles total) by mouth once. 1/2 and repeat in 6 hours.  Dispense: 195 mL; Refill: 0 - senna-docusate (SENOKOT-S) 8.6-50 MG tablet; Take 1 tablet by mouth 2 (two) times daily.  Dispense: 1 tablet; Refill: 0   5. Skin lesion of face New problem. Will refer.   - Ambulatory referral to Dermatology       Margarita Rana, MD  [facility]

## 2015-07-17 ENCOUNTER — Telehealth: Payer: Self-pay | Admitting: Family Medicine

## 2015-07-17 NOTE — Telephone Encounter (Signed)
Advised pt her pneumonia vaccines are UTD. Renaldo Fiddler, CMA

## 2015-07-17 NOTE — Telephone Encounter (Signed)
Pt wants to know if she has had a pneumonia vaccine and if so when and does she need a booster shot.  Her call back is [phone]  Thanks Con Memos

## 2015-07-19 ENCOUNTER — Telehealth: Payer: Self-pay | Admitting: Family Medicine

## 2015-07-19 NOTE — Telephone Encounter (Signed)
Can try Diflucan. Thanks.

## 2015-07-19 NOTE — Telephone Encounter (Signed)
Pt called saying her yeast infection is still pretty bad, vaginally and under her breast.  She has one dose left of the terconazole cream.  She wants to know if there is anything else stronger she can use or anything else she can do to help it.  Please advise.. [phone]  Thanks Con Memos

## 2015-07-19 NOTE — Telephone Encounter (Signed)
Pt has already tried Diflucan several times.  She needs to come in to reassess.    Thanks,   -Mickel Baas

## 2015-07-20 ENCOUNTER — Ambulatory Visit (INDEPENDENT_AMBULATORY_CARE_PROVIDER_SITE_OTHER): Payer: PPO | Admitting: Family Medicine

## 2015-07-20 ENCOUNTER — Encounter: Payer: Self-pay | Admitting: Family Medicine

## 2015-07-20 VITALS — BP 126/84 | HR 68 | Temp 97.4°F | Resp 16 | Wt 144.0 lb

## 2015-07-20 DIAGNOSIS — B3789 Other sites of candidiasis: Secondary | ICD-10-CM | POA: Insufficient documentation

## 2015-07-20 DIAGNOSIS — N952 Postmenopausal atrophic vaginitis: Secondary | ICD-10-CM | POA: Insufficient documentation

## 2015-07-20 DIAGNOSIS — B3731 Acute candidiasis of vulva and vagina: Secondary | ICD-10-CM

## 2015-07-20 DIAGNOSIS — B373 Candidiasis of vulva and vagina: Secondary | ICD-10-CM

## 2015-07-20 MED ORDER — ESTRADIOL 10 MCG VA TABS: 10.0000 ug | ORAL_TABLET | VAGINAL | Status: DC

## 2015-07-20 NOTE — Progress Notes (Signed)
Patient ID: Sarah Todd, female   DOB: Jun 25, 1946, 69 y.o.   MRN: [id number]       Patient: Sarah Todd Female    DOB: [birth year]   69 y.o.   MRN: [id number] Visit Date: 07/20/2015  Today's Provider: Margarita Rana, MD   Chief Complaint  Patient presents with  . Rash  . Vaginal Discharge   Subjective:    Rash This is a recurrent problem. The affected locations include the chest (Under left breast). The rash is characterized by burning and itchiness. She was exposed to nothing. Past treatments include topical steroids and anti-itch cream. The treatment provided no relief.  Vaginal Discharge The patient's primary symptoms include vaginal discharge (and burning). The patient's pertinent negatives include no genital itching, genital rash or vaginal bleeding. This is a recurrent problem. The current episode started in the past 7 days. The problem has been gradually worsening. Associated symptoms include rash. Pertinent negatives include no frequency, hematuria or urgency. The vaginal discharge was clear. There has been no bleeding. She has not been passing clots. She has not been passing tissue. She is sexually active. She uses hysterectomy for contraception. She is postmenopausal.       Allergies  Allergen Reactions  . Lisinopril Anaphylaxis  . Shrimp [Shellfish Allergy] Anaphylaxis   Previous Medications   ACETAMINOPHEN (TYLENOL) 650 MG CR TABLET    Take 650 mg by mouth every 4 (four) hours as needed for pain.   ASPIRIN 81 MG TABLET    Take 81 mg by mouth daily. AM   CALCIUM CITRATE 200 MG TABS    Take by mouth.   CHOLECALCIFEROL (VITAMIN D-3 PO)    Take 1,000 Units by mouth daily. AM   ESTRADIOL (ESTRACE) 1 MG TABLET    Take by mouth.   FLUCONAZOLE (DIFLUCAN) 150 MG TABLET    Take 1 tablet (150 mg total) by mouth once.   GABAPENTIN (NEURONTIN) 600 MG TABLET    Take 600 mg by mouth 3 (three) times daily.   KETOCONAZOLE (NIZORAL) 2 % CREAM    Apply 1 application  topically daily.   LOSARTAN (COZAAR) 50 MG TABLET    Take 1 tablet (50 mg total) by mouth daily. AM   MAGNESIUM CITRATE SOLN    Take 148 mLs (0.5 Bottles total) by mouth once. 1/2 and repeat in 6 hours.   MULTIPLE VITAMIN (MULTIVITAMIN) CAPSULE    Take 1 capsule by mouth daily. AM   NYSTATIN (MYCOSTATIN/NYSTOP) 100000 UNIT/GM POWD    Apply to affected area two times daily.   OMEGA 3 1000 MG CAPS    Take 1,000 mg by mouth daily. AM   SENNA-DOCUSATE (SENOKOT-S) 8.6-50 MG TABLET    Take 1 tablet by mouth 2 (two) times daily.   VITAMIN C (ASCORBIC ACID) 500 MG TABLET    Take by mouth.    Review of Systems  Constitutional: Negative.   Genitourinary: Positive for vaginal discharge (and burning). Negative for urgency, frequency, hematuria, vaginal bleeding, difficulty urinating, genital sores and vaginal pain.  Skin: Positive for rash. Negative for color change, pallor and wound.    Social History  Substance Use Topics  . Smoking status: Never Smoker   . Smokeless tobacco: Never Used  . Alcohol Use: 0.6 oz/week    1 Glasses of wine per week   Objective:   BP 126/84 mmHg  Pulse 68  Temp(Src) 97.4 F (36.3 C) (Oral)  Resp 16  Wt 144 lb (65.318 kg)  Physical Exam  Constitutional: She is oriented to person, place, and time. She appears well-developed and well-nourished.  Abdominal: Soft. Bowel sounds are normal. She exhibits no distension.  Genitourinary: Vagina normal. No vaginal discharge found.  Neurological: She is alert and oriented to person, place, and time.  Skin: Rash (Improved from previous but stilll with area of erythema  excoriation under left breast.  ) noted.  Psychiatric: She has a normal mood and affect.      Assessment & Plan:     1. Candida vaginitis Did not see any yeast under microscope, Saw normal lactobacillus. No clue cells.  Will send to lab to confirm.  Suspect atrophic vaginitis.  - WET PREP FOR [place], YEAST, CLUE  2. Candidiasis of breast  Will try  witch hazel.    3. Atrophic vaginitis New problem. Now that yeast resolved, still with symptoms. Will treat.  Patient instructed to call back if condition worsens or does not improve.    - Estradiol 10 MCG TABS vaginal tablet; Place 1 tablet (10 mcg total) vaginally 3 (three) times a week.  Dispense: 12 tablet; Refill: [place], MD  [facility]

## 2015-07-22 LAB — WET PREP FOR TRICH, YEAST, CLUE
CLUE CELL EXAM: NEGATIVE
TRICHOMONAS EXAM: NEGATIVE
YEAST EXAM: NEGATIVE

## 2015-07-24 ENCOUNTER — Telehealth: Payer: Self-pay

## 2015-07-24 NOTE — Telephone Encounter (Signed)
-----   Message from Margarita Rana, MD sent at 07/22/2015  8:46 AM EST ----- Normal wet prep as suspected. Proceed with vaginal suppositories as discussed.   Give it a few weeks to see if helps. Thanks.

## 2015-07-24 NOTE — Telephone Encounter (Signed)
LMTCB  Thanks,  -Joseline 

## 2015-07-25 NOTE — Telephone Encounter (Signed)
Advised patient as below.  

## 2015-10-13 DIAGNOSIS — I10 Essential (primary) hypertension: Secondary | ICD-10-CM | POA: Diagnosis not present

## 2015-10-13 DIAGNOSIS — Z6825 Body mass index (BMI) 25.0-25.9, adult: Secondary | ICD-10-CM | POA: Diagnosis not present

## 2015-10-13 DIAGNOSIS — Z79899 Other long term (current) drug therapy: Secondary | ICD-10-CM | POA: Diagnosis not present

## 2015-10-13 DIAGNOSIS — M5416 Radiculopathy, lumbar region: Secondary | ICD-10-CM | POA: Diagnosis not present

## 2015-10-26 ENCOUNTER — Encounter: Payer: Self-pay | Admitting: Family Medicine

## 2015-10-26 ENCOUNTER — Ambulatory Visit (INDEPENDENT_AMBULATORY_CARE_PROVIDER_SITE_OTHER): Payer: PPO | Admitting: Family Medicine

## 2015-10-26 VITALS — BP 102/64 | HR 90 | Temp 98.1°F | Resp 16 | Wt 147.0 lb

## 2015-10-26 DIAGNOSIS — M542 Cervicalgia: Secondary | ICD-10-CM

## 2015-10-26 NOTE — Progress Notes (Signed)
   Subjective:    Patient ID: Sarah Todd, female    DOB: 03/02/1946, 70 y.o.   MRN: [id number]  HPI Ear Pain: Patient presents with left ear pain.  Symptoms include left ear pain. Symptoms began 2 days ago and are unchanged since that time. Patient denies fever and nasal congestion. Ear history: 0 previous ear infections. Patient reports that pain radiates down left side of neck and shoulder. Patient reports she takes Tylenol for pain and reports moderate relief. Patient reports that she put peroxide in her left ear and that helped with cleaning.   Review of Systems  Constitutional: Negative.   HENT: Positive for ear discharge and ear pain.   Musculoskeletal: Positive for myalgias and neck pain.  Skin: Negative.   Neurological: Positive for headaches.      Blood pressure 102/64, pulse 90, temperature 98.1 F (36.7 C), temperature source Oral, resp. rate 16, weight 147 lb (66.679 kg), SpO2 98 %. Objective:   Physical Exam  Constitutional: She appears well-developed and well-nourished.  HENT:  Head: Normocephalic and atraumatic.  Right Ear: External ear normal.  Left Ear: External ear normal.  Turbinates swollen  Musculoskeletal: She exhibits tenderness.  Tender over her left sternocleidomastoid      Assessment & Plan:  1. Neck pain, acute  New problem.  Worsening. Will try tylenol three times daily.  Change pillow.  Patient instructed to call back if condition worsens or does not improve.     Patient was seen and examined by Jerrell Belfast, MD, and note scribed by Lynford Humphrey, Sarah Todd.   I have reviewed the document for accuracy and completeness and I agree with above. Jerrell Belfast, MD   Margarita Rana, MD

## 2015-12-13 ENCOUNTER — Encounter: Payer: Self-pay | Admitting: Family Medicine

## 2015-12-13 ENCOUNTER — Ambulatory Visit (INDEPENDENT_AMBULATORY_CARE_PROVIDER_SITE_OTHER): Payer: PPO | Admitting: Family Medicine

## 2015-12-13 VITALS — BP 136/70 | HR 84 | Temp 98.2°F | Resp 16 | Ht 61.75 in | Wt 149.0 lb

## 2015-12-13 DIAGNOSIS — Z0000 Encounter for general adult medical examination without abnormal findings: Secondary | ICD-10-CM

## 2015-12-13 DIAGNOSIS — Z1231 Encounter for screening mammogram for malignant neoplasm of breast: Secondary | ICD-10-CM | POA: Diagnosis not present

## 2015-12-13 DIAGNOSIS — I10 Essential (primary) hypertension: Secondary | ICD-10-CM | POA: Diagnosis not present

## 2015-12-13 DIAGNOSIS — Z126 Encounter for screening for malignant neoplasm of bladder: Secondary | ICD-10-CM

## 2015-12-13 LAB — POCT URINALYSIS DIPSTICK
BILIRUBIN UA: NEGATIVE
Blood, UA: NEGATIVE
Glucose, UA: NEGATIVE
KETONES UA: NEGATIVE
LEUKOCYTES UA: NEGATIVE
Nitrite, UA: NEGATIVE
PROTEIN UA: NEGATIVE
SPEC GRAV UA: 1.01
Urobilinogen, UA: 0.2
pH, UA: 7.5

## 2015-12-13 NOTE — Progress Notes (Signed)
Patient: Sarah Todd, Female    DOB: 05/08/1946, 70 y.o.   MRN: [id number] Visit Date: 12/13/2015  Today's Provider: Margarita Rana, MD   Chief Complaint  Patient presents with  . Medicare Wellness   Subjective:    Annual wellness visit Sarah Todd is a 70 y.o. female. She feels well. She reports exercising never. She reports she is sleeping fairly well.  Last Mammogram- 11/17/2014- BI-RADS 1 Last BMD- 10/05/2013- Normal. Last Colonoscopy- 01/05/2015- Polyp (tubular adenoma)l Recheck 5 years. -----------------------------------------------------------   Review of Systems  Endocrine: Positive for polyuria.  Genitourinary: Positive for urgency.  Musculoskeletal: Positive for back pain and arthralgias.  Allergic/Immunologic: Positive for food allergies.  All other systems reviewed and are negative.   Social History   Social History  . Marital Status: Married    Spouse Name: Sarah Todd  . Number of Children: 3  . Years of Education: 15   Occupational History  . retired    Social History Main Topics  . Smoking status: Never Smoker   . Smokeless tobacco: Never Used  . Alcohol Use: 0.6 oz/week    1 Glasses of wine per week     Comment: occasionally  . Drug Use: No  . Sexual Activity: Yes   Other Topics Concern  . Not on file   Social History Narrative    Past Medical History  Diagnosis Date  . Hypertension   . Varicose veins     Mild  . Arthritis     hands  . Allergy   . Osteoporosis      Patient Active Problem List   Diagnosis Date Noted  . Candidiasis of breast 07/20/2015  . Atrophic vaginitis 07/20/2015  . Constipation 06/20/2015  . Skin lesion of face 06/20/2015  . Tinea corporis 05/19/2015  . Allergic rhinitis 02/06/2015  . Arthritis 02/06/2015  . BP (high blood pressure) 02/06/2015  . OP (osteoporosis) 02/06/2015  . Shoulder strain 02/06/2015  . Dermatophytosis of groin 02/06/2015  . Candida vaginitis  02/06/2015    Past Surgical History  Procedure Laterality Date  . Abdominal hysterectomy    . Back surgery    . Tubal ligation    . Dilation and curettage of uterus    . Colonoscopy N/A 01/05/2015    Procedure: COLONOSCOPY;  Surgeon: Sarah Lame, MD;  Location: [place];  Service: Gastroenterology;  Laterality: N/A;    Her family history includes Alzheimer's disease in her father; Cancer in her mother and sister; Heart disease in her father; Hypertension in her father.    Previous Medications   ACETAMINOPHEN (TYLENOL) 650 MG CR TABLET    Take 650 mg by mouth every 4 (four) hours as needed for pain.   ASPIRIN 81 MG TABLET    Take 81 mg by mouth daily. 1/2 tab po qd   CALCIUM CITRATE 200 MG TABS    Take 1 tablet by mouth daily.    CHOLECALCIFEROL (VITAMIN D-3 PO)    Take 1,000 Units by mouth daily. AM   ESTRADIOL (ESTRACE) 1 MG TABLET    Take by mouth daily. 1/2 tablet daily   GABAPENTIN (NEURONTIN) 600 MG TABLET    Take 600 mg by mouth 2 (two) times daily.    LOSARTAN (COZAAR) 50 MG TABLET    Take 1 tablet (50 mg total) by mouth daily. AM   MULTIPLE VITAMIN (MULTIVITAMIN) CAPSULE    Take 1 capsule by mouth daily. AM   OMEGA 3  1000 MG CAPS    Take 1,000 mg by mouth daily. AM   VITAMIN C (ASCORBIC ACID) 500 MG TABLET    Take by mouth.    Patient Care Team: Sarah Rana, MD as PCP - General (Family Medicine)     Objective:   Vitals: BP 136/70 mmHg  Pulse 84  Temp(Src) 98.2 F (36.8 C) (Oral)  Resp 16  Ht 5' 1.75" (1.568 m)  Wt 149 lb (67.586 kg)  BMI 27.49 kg/m2  Physical Exam  Constitutional: She is oriented to person, place, and time. She appears well-developed and well-nourished.  HENT:  Head: Normocephalic and atraumatic.  Right Ear: Tympanic membrane, external ear and ear canal normal.  Left Ear: Tympanic membrane, external ear and ear canal normal.  Nose: Nose normal.  Mouth/Throat: Uvula is midline, oropharynx is clear and moist and mucous membranes are  normal.  Eyes: Conjunctivae, EOM and lids are normal. Pupils are equal, round, and reactive to light.  Neck: Trachea normal and normal range of motion. Neck supple. Carotid bruit is not present. No thyroid mass and no thyromegaly present.  Cardiovascular: Normal rate, regular rhythm and normal heart sounds.   Pulmonary/Chest: Effort normal and breath sounds normal.  Abdominal: Soft. Normal appearance and bowel sounds are normal. There is no hepatosplenomegaly. There is no tenderness.  Genitourinary: No breast swelling, tenderness or discharge.  Musculoskeletal: Normal range of motion.  Lymphadenopathy:    She has no cervical adenopathy.    She has no axillary adenopathy.  Neurological: She is alert and oriented to person, place, and time. She has normal strength. No cranial nerve deficit.  Skin: Skin is warm, dry and intact.  Psychiatric: She has a normal mood and affect. Her speech is normal and behavior is normal. Judgment and thought content normal. Cognition and memory are normal.    Activities of Daily Living In your present state of health, do you have any difficulty performing the following activities: 12/13/2015  Hearing? N  Vision? Y  Difficulty concentrating or making decisions? Y  Walking or climbing stairs? N  Dressing or bathing? N  Doing errands, shopping? N    Fall Risk Assessment Fall Risk  12/13/2015 02/06/2015  Falls in the past year? No No     Depression Screen PHQ 2/9 Scores 12/13/2015  PHQ - 2 Score 0    Cognitive Testing - 6-CIT  Correct? Score   What year is it? yes 0 0 or 4  What month is it? yes 0 0 or 3  Memorize:    Sarah Todd,  40,  [address]  [place],      What time is it? (within 1 hour) yes 0 0 or 3  Count backwards from 20 yes 0 0, 2, or 4  Name the months of the year yes 0 0, 2, or 4  Repeat name & address above no 2 0, 2, 4, 6, 8, or 10       TOTAL SCORE  2/28   Interpretation:  Normal  Normal (0-7) Abnormal (8-28)       Assessment  & Plan:     Annual Wellness Visit  Reviewed patient's Family Medical History Reviewed and updated list of patient's medical providers Assessment of cognitive impairment was done Assessed patient's functional ability Established a written schedule for health screening [place] Completed and Reviewed  Exercise Activities and Dietary recommendations Goals    None      Immunization History  Administered Date(s) Administered  .  Influenza,inj,Quad PF,36+ Mos 06/15/2015  . Pneumococcal Conjugate-13 11/17/2014  . Pneumococcal Polysaccharide-23 08/03/2013    Health Maintenance  Topic Date Due  . Hepatitis C Screening  07/05/1946  . TETANUS/TDAP  12/29/1962  . ZOSTAVAX  12/29/2003  . INFLUENZA VACCINE  04/02/2016  . MAMMOGRAM  11/16/2016  . COLONOSCOPY  01/04/2025  . DEXA SCAN  Completed  . PNA vac Low Risk Adult  Completed      Discussed health benefits of physical activity, and encouraged her to engage in regular exercise appropriate for her age and condition.    ------------------------------------------------------------------------------------------------------------  1. Medicare annual wellness visit, subsequent Stable. As above. Encouraged 150 minutes of exercise per week.  2. Bladder cancer screening UA negative. - POCT urinalysis dipstick  Results for orders placed or performed in visit on 12/13/15  POCT urinalysis dipstick  Result Value Ref Range   Color, UA yellow    Clarity, UA clear    Glucose, UA negative    Bilirubin, UA negative    Ketones, UA negative    Spec Grav, UA 1.010    Blood, UA negative    pH, UA 7.5    Protein, UA negative    Urobilinogen, UA 0.2    Nitrite, UA negative    Leukocytes, UA Negative Negative     3. Encounter for screening mammogram for breast cancer Mammo ordered today. - MM DIGITAL SCREENING BILATERAL; Future  4. Essential hypertension Stable. Check labs as below. FU pending results. - CBC  with Differential/Platelet - Comprehensive metabolic panel - TSH    Patient seen and examined by Jerrell Belfast, MD, and note scribed by Renaldo Fiddler, CMA.  I have reviewed the document for accuracy and completeness and I agree with above. Jerrell Belfast, MD   Sarah Rana, MD

## 2015-12-19 ENCOUNTER — Ambulatory Visit
Admission: RE | Admit: 2015-12-19 | Discharge: 2015-12-19 | Disposition: A | Discharge status: Home / Self Care | Payer: PPO | Source: Ambulatory Visit | Attending: Family Medicine | Admitting: Family Medicine

## 2015-12-19 DIAGNOSIS — Z1231 Encounter for screening mammogram for malignant neoplasm of breast: Secondary | ICD-10-CM | POA: Insufficient documentation

## 2015-12-19 IMAGING — MG MM DIGITAL SCREENING BILAT W/ CAD
6 series · 6 of 6 positions shown · non-contrast
Comparison: Previous exam(s).

CLINICAL DATA: Screening.

EXAM:
DIGITAL SCREENING BILATERAL MAMMOGRAM WITH CAD

[L MLO]
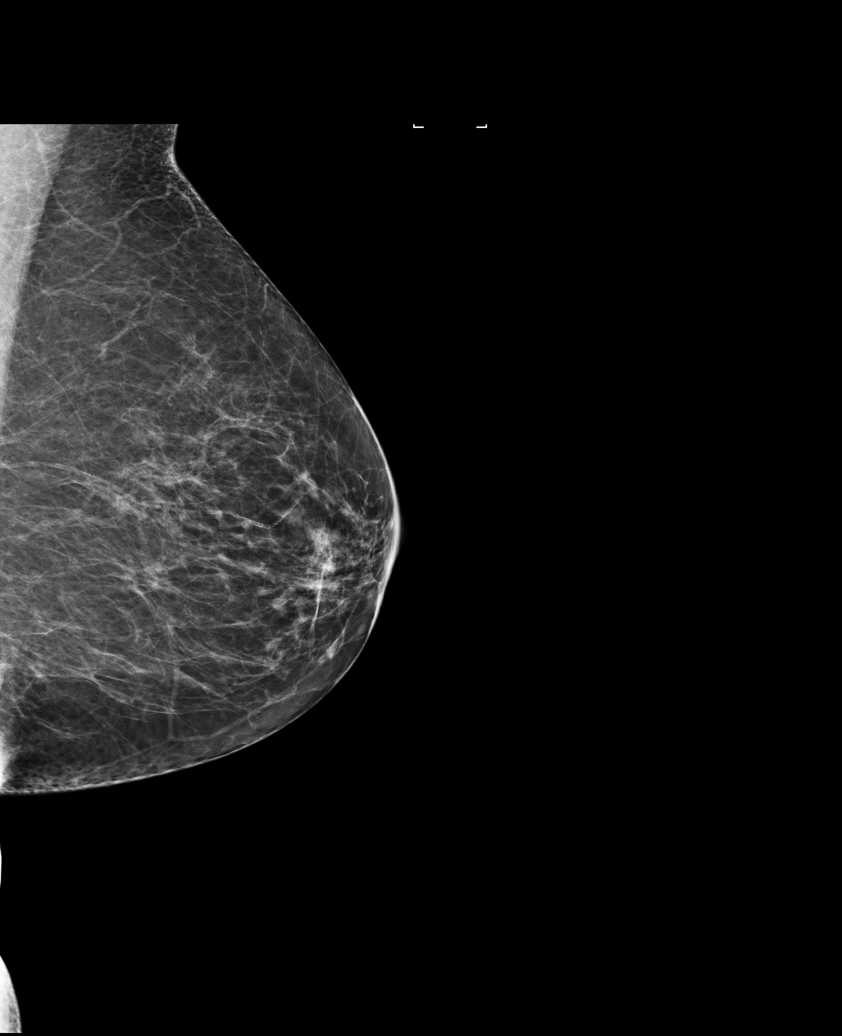

[L CC]
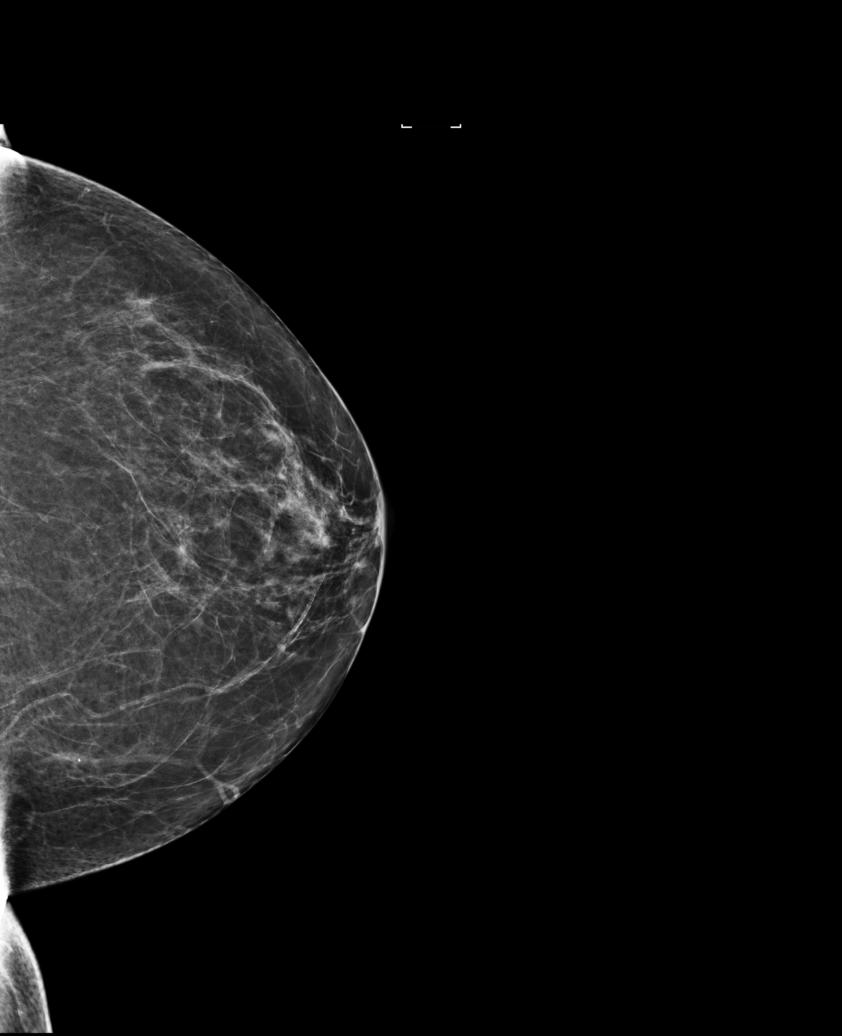

[R MLO (1 of 2)]
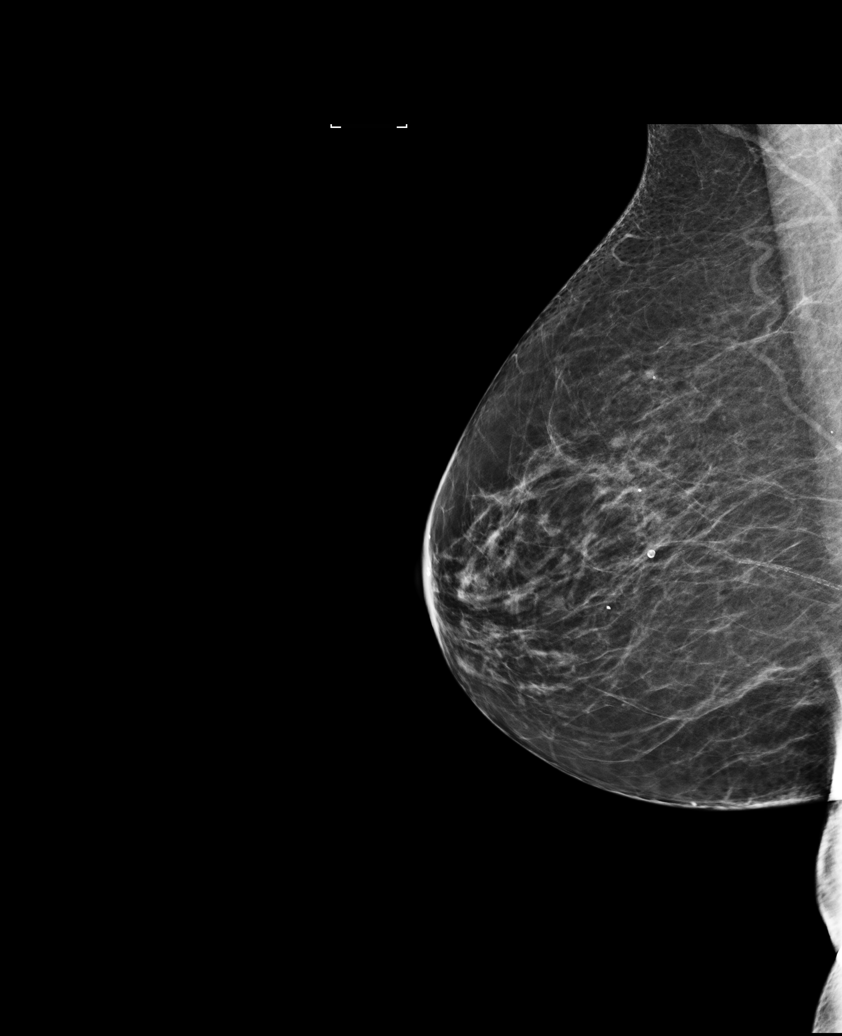

[R CC]
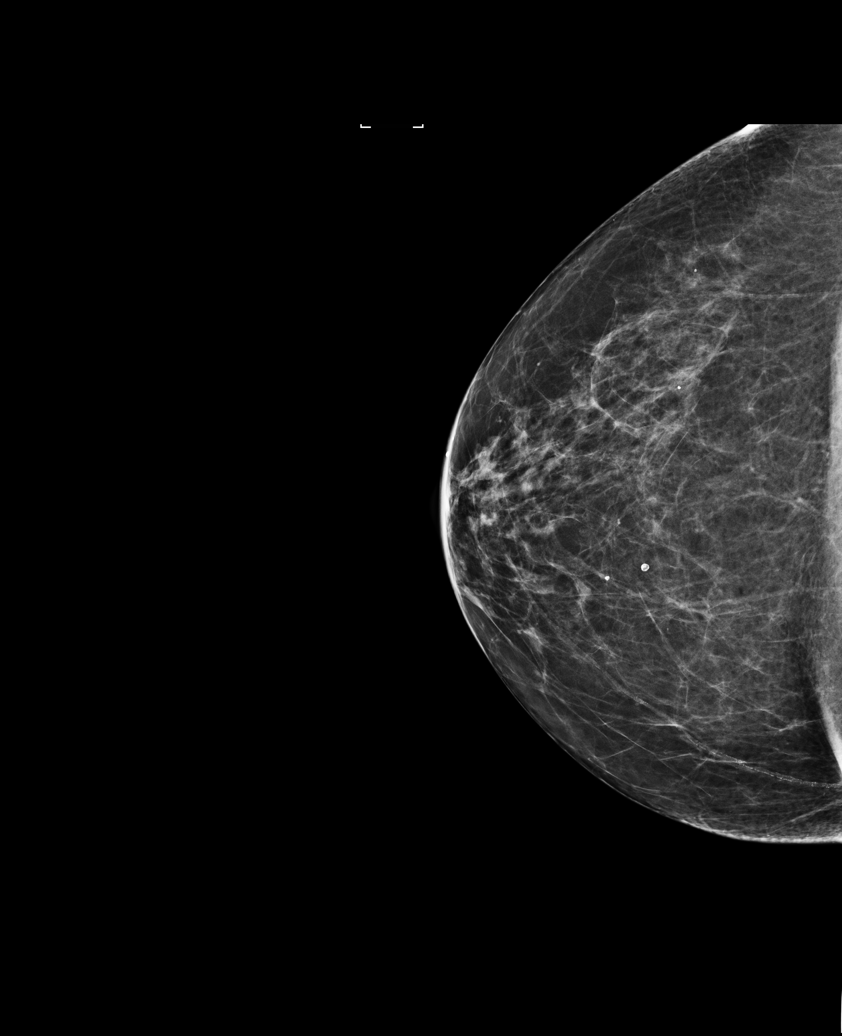

[R MLO (2 of 2)]
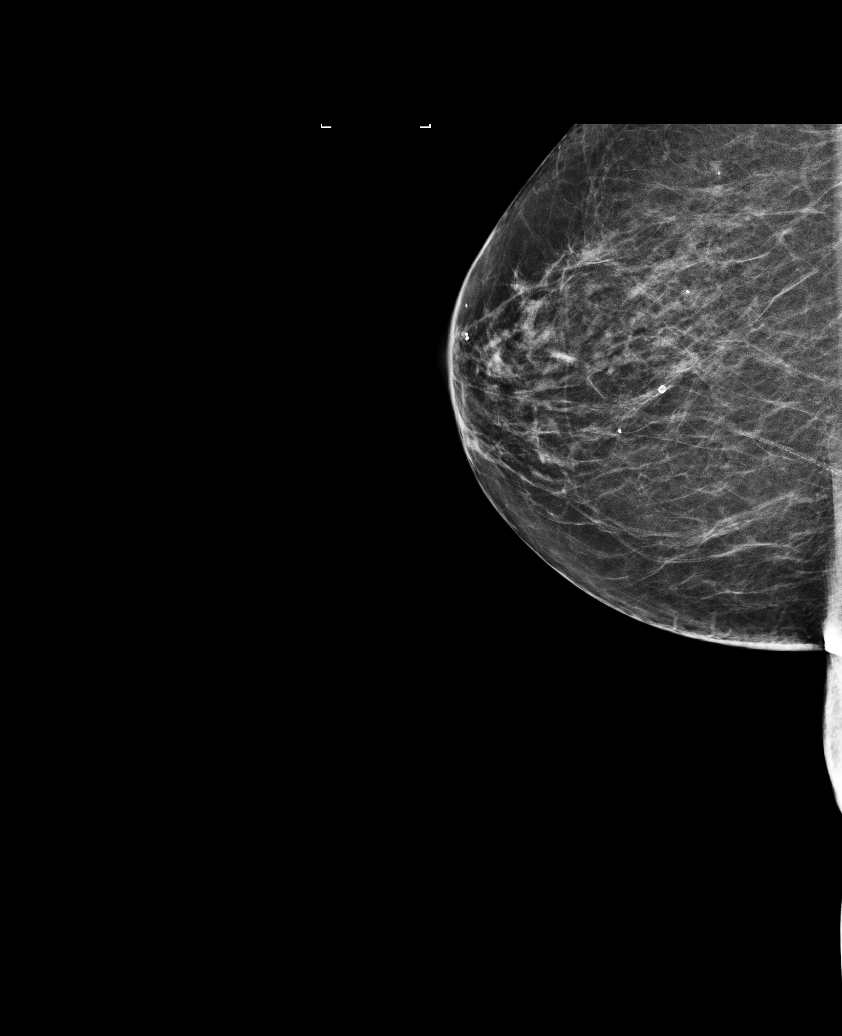

[R XCCM]
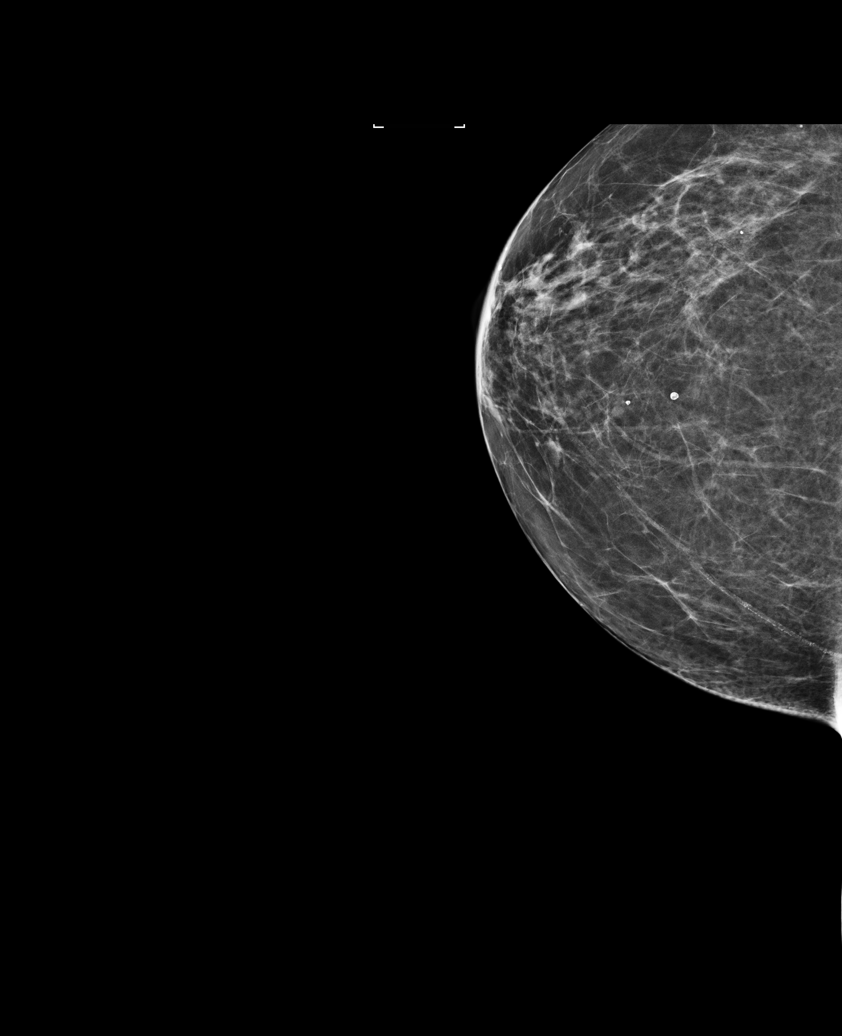

[6 of 6 positions shown; findings below may reference images not displayed]

ACR Breast Density Category b: There are scattered areas of
fibroglandular density.
FINDINGS: There are no findings suspicious for malignancy. Images were
processed with CAD.
IMPRESSION: No mammographic evidence of malignancy. A result letter of this
screening mammogram will be mailed directly to the patient.

RECOMMENDATION:
Screening mammogram in one year. (Code:[US])

BI-RADS CATEGORY  1: Negative.

## 2015-12-26 ENCOUNTER — Encounter: Payer: Self-pay | Admitting: Family Medicine

## 2015-12-26 ENCOUNTER — Ambulatory Visit (INDEPENDENT_AMBULATORY_CARE_PROVIDER_SITE_OTHER): Payer: PPO | Admitting: Family Medicine

## 2015-12-26 ENCOUNTER — Other Ambulatory Visit: Payer: Self-pay | Admitting: Family Medicine

## 2015-12-26 VITALS — BP 124/84 | HR 84 | Temp 98.2°F | Resp 16 | Ht 61.75 in | Wt 148.0 lb

## 2015-12-26 DIAGNOSIS — N952 Postmenopausal atrophic vaginitis: Secondary | ICD-10-CM

## 2015-12-26 DIAGNOSIS — J069 Acute upper respiratory infection, unspecified: Secondary | ICD-10-CM | POA: Diagnosis not present

## 2015-12-26 DIAGNOSIS — Z780 Asymptomatic menopausal state: Secondary | ICD-10-CM

## 2015-12-26 MED ORDER — AMOXICILLIN 500 MG PO CAPS: 1000.0000 mg | ORAL_CAPSULE | Freq: Two times a day (BID) | ORAL | Status: AC

## 2015-12-26 NOTE — Patient Instructions (Addendum)
Start amoxicillin if your symptoms worsen, if you develop a fever, or if not feeling significantly better by the end of the week  Continue OTC Mucinex and try OTC Nasal saline spray.   Upper Respiratory Infection, Adult Most upper respiratory infections (URIs) are a viral infection of the air passages leading to the lungs. A URI affects the nose, throat, and upper air passages. The most common type of URI is nasopharyngitis and is typically referred to as "the common cold." URIs run their course and usually go away on their own. Most of the time, a URI does not require medical attention, but sometimes a bacterial infection in the upper airways can follow a viral infection. This is called a secondary infection. Sinus and middle ear infections are common types of secondary upper respiratory infections. Bacterial pneumonia can also complicate a URI. A URI can worsen asthma and chronic obstructive pulmonary disease (COPD). Sometimes, these complications can require emergency medical care and may be life threatening.  CAUSES Almost all URIs are caused by viruses. A virus is a type of germ and can spread from one person to another.  RISKS FACTORS You may be at risk for a URI if:   You smoke.   You have chronic heart or lung disease.  You have a weakened defense (immune) system.   You are very young or very old.   You have nasal allergies or asthma.  You work in crowded or poorly ventilated areas.  You work in health care facilities or schools. SIGNS AND SYMPTOMS  Symptoms typically develop 2-3 days after you come in contact with a cold virus. Most viral URIs last 7-10 days. However, viral URIs from the influenza virus (flu virus) can last 14-18 days and are typically more severe. Symptoms may include:   Runny or stuffy (congested) nose.   Sneezing.   Cough.   Sore throat.   Headache.   Fatigue.   Fever.   Loss of appetite.   Pain in your forehead, behind your eyes,  and over your cheekbones (sinus pain).  Muscle aches.  DIAGNOSIS  Your health care provider may diagnose a URI by:  Physical exam.  Tests to check that your symptoms are not due to another condition such as:  Strep throat.  Sinusitis.  Pneumonia.  Asthma. TREATMENT  A URI goes away on its own with time. It cannot be cured with medicines, but medicines may be prescribed or recommended to relieve symptoms. Medicines may help:  Reduce your fever.  Reduce your cough.  Relieve nasal congestion. HOME CARE INSTRUCTIONS   Take medicines only as directed by your health care provider.   Gargle warm saltwater or take cough drops to comfort your throat as directed by your health care provider.  Use a warm mist humidifier or inhale steam from a shower to increase air moisture. This may make it easier to breathe.  Drink enough fluid to keep your urine clear or pale yellow.   Eat soups and other clear broths and maintain good nutrition.   Rest as needed.   Return to work when your temperature has returned to normal or as your health care provider advises. You may need to stay home longer to avoid infecting others. You can also use a face mask and careful hand washing to prevent spread of the virus.  Increase the usage of your inhaler if you have asthma.   Do not use any tobacco products, including cigarettes, chewing tobacco, or electronic cigarettes. If you need  help quitting, ask your health care provider. PREVENTION  The best way to protect yourself from getting a cold is to practice good hygiene.   Avoid oral or hand contact with people with cold symptoms.   Wash your hands often if contact occurs.  There is no clear evidence that vitamin C, vitamin E, echinacea, or exercise reduces the chance of developing a cold. However, it is always recommended to get plenty of rest, exercise, and practice good nutrition.  SEEK MEDICAL CARE IF:   You are getting worse rather than  better.   Your symptoms are not controlled by medicine.   You have chills.  You have worsening shortness of breath.  You have brown or red mucus.  You have yellow or brown nasal discharge.  You have pain in your face, especially when you bend forward.  You have a fever.  You have swollen neck glands.  You have pain while swallowing.  You have white areas in the back of your throat. SEEK IMMEDIATE MEDICAL CARE IF:   You have severe or persistent:  Headache.  Ear pain.  Sinus pain.  Chest pain.  You have chronic lung disease and any of the following:  Wheezing.  Prolonged cough.  Coughing up blood.  A change in your usual mucus.  You have a stiff neck.  You have changes in your:  Vision.  Hearing.  Thinking.  Mood. MAKE SURE YOU:   Understand these instructions.  Will watch your condition.  Will get help right away if you are not doing well or get worse.   This information is not intended to replace advice given to you by your health care provider. Make sure you discuss any questions you have with your health care provider.   Document Released: 02/12/2001 Document Revised: 01/03/2015 Document Reviewed: 11/24/2013 Elsevier Interactive Patient Education [employer].

## 2015-12-26 NOTE — Progress Notes (Signed)
Patient: Sarah Todd Female    DOB: 1946-07-18   70 y.o.   MRN: [id number] Visit Date: 12/26/2015  Today's Provider: Lelon Huh, MD   Chief Complaint  Patient presents with  . Cough   Subjective:    Cough This is a new problem. The current episode started in the past 7 days (3 days ago). The problem occurs constantly. The cough is productive of sputum. Associated symptoms include ear congestion, ear pain, headaches, myalgias, nasal congestion, postnasal drip, rhinorrhea and a sore throat. Pertinent negatives include no chest pain, chills, fever, heartburn, hemoptysis, rash, shortness of breath, sweats or wheezing. The symptoms are aggravated by exercise. Treatments tried: mucinex. The treatment provided moderate relief. There is no history of asthma, bronchiectasis, bronchitis, COPD, emphysema, environmental allergies or pneumonia.  Started feeling dizzy around 12/21/15. Cough and sore throat started Saturday 12/23/2015. Has symptoms of headache, sore throat, weakness, cough (productive) ear congestion, and sinus pressure. Has taken mucinex with moderate relief.    Allergies  Allergen Reactions  . Lisinopril Anaphylaxis  . Shrimp [Shellfish Allergy] Anaphylaxis   Previous Medications   ACETAMINOPHEN (TYLENOL) 650 MG CR TABLET    Take 650 mg by mouth every 4 (four) hours as needed for pain.   ASPIRIN 81 MG TABLET    Take 81 mg by mouth daily. 1/2 tab po qd   CALCIUM CITRATE 200 MG TABS    Take 1 tablet by mouth daily.    CHOLECALCIFEROL (VITAMIN D-3 PO)    Take 1,000 Units by mouth daily. AM   ESTRADIOL (ESTRACE) 1 MG TABLET    TAKE ONE (1) TABLET BY MOUTH EVERY DAY   GABAPENTIN (NEURONTIN) 600 MG TABLET    Take 600 mg by mouth 2 (two) times daily.    LOSARTAN (COZAAR) 50 MG TABLET    Take 1 tablet (50 mg total) by mouth daily. AM   MULTIPLE VITAMIN (MULTIVITAMIN) CAPSULE    Take 1 capsule by mouth daily. AM   OMEGA 3 1000 MG CAPS    Take 1,000 mg by mouth daily. AM   VITAMIN C (ASCORBIC ACID) 500 MG TABLET    Take by mouth.    Review of Systems  Constitutional: Positive for fatigue. Negative for fever, chills and appetite change.  HENT: Positive for congestion, ear pain, postnasal drip, rhinorrhea, sinus pressure and sore throat.   Respiratory: Positive for cough. Negative for hemoptysis, chest tightness, shortness of breath and wheezing.   Cardiovascular: Negative for chest pain and palpitations.  Gastrointestinal: Negative for heartburn, nausea, vomiting and abdominal pain.  Musculoskeletal: Positive for myalgias.  Skin: Negative for rash.  Allergic/Immunologic: Negative for environmental allergies.  Neurological: Positive for light-headedness and headaches. Negative for weakness.    Social History  Substance Use Topics  . Smoking status: Never Smoker   . Smokeless tobacco: Never Used  . Alcohol Use: 0.6 oz/week    1 Glasses of wine per week     Comment: occasionally   Objective:   BP 124/84 mmHg  Pulse 84  Temp(Src) 98.2 F (36.8 C) (Oral)  Resp 16  Ht 5' 1.75" (1.568 m)  Wt 148 lb (67.132 kg)  BMI 27.30 kg/m2  SpO2 97%  Physical Exam  General Appearance:    Alert, cooperative, no distress  HENT:   bilateral TM normal without fluid or infection, neck without nodes, throat normal without erythema or exudate, sinuses nontender, post nasal drip noted and nasal mucosa pale and congested  Eyes:  PERRL, conjunctiva/corneas clear, EOM's intact       Lungs:     Clear to auscultation bilaterally, respirations unlabored  Heart:    Regular rate and rhythm  Neurologic:   Awake, alert, oriented x 3. No apparent focal neurological           defect.           Assessment & Plan:     1. URI, acute Counseled regarding signs and symptoms of viral and bacterial respiratory infections. Advised start amoxocillin prescription that was given to her if she develops any sign of bacterial infection, or if current symptoms last longer than 10 days.          Lelon Huh, MD  [facility]

## 2016-01-01 DIAGNOSIS — H25013 Cortical age-related cataract, bilateral: Secondary | ICD-10-CM | POA: Diagnosis not present

## 2016-02-01 ENCOUNTER — Telehealth: Payer: Self-pay | Admitting: Family Medicine

## 2016-02-01 DIAGNOSIS — I10 Essential (primary) hypertension: Secondary | ICD-10-CM

## 2016-02-01 NOTE — Telephone Encounter (Signed)
OK to reorder. Make sure she gets done soon. Thanks.

## 2016-02-01 NOTE — Telephone Encounter (Signed)
Had ordered CBC, CMP, TSH for HTN. Okay to reorder? Renaldo Fiddler, CMA

## 2016-02-01 NOTE — Telephone Encounter (Signed)
Pt states she has her CPE in April and did not have labs done within 30 days.  Pt is requesting another lab slip to have labs done.  CB#[phone] or [phone]

## 2016-02-01 NOTE — Telephone Encounter (Signed)
Printed off labs slip and informed pt. Renaldo Fiddler, CMA

## 2016-02-05 DIAGNOSIS — I10 Essential (primary) hypertension: Secondary | ICD-10-CM | POA: Diagnosis not present

## 2016-02-06 ENCOUNTER — Telehealth: Payer: Self-pay

## 2016-02-06 LAB — CBC WITH DIFFERENTIAL/PLATELET
BASOS ABS: 0 10*3/uL (ref 0.0–0.2)
BASOS: 1 %
EOS (ABSOLUTE): 0.1 10*3/uL (ref 0.0–0.4)
Eos: 2 %
Hematocrit: 39.4 % (ref 34.0–46.6)
Hemoglobin: 13.3 g/dL (ref 11.1–15.9)
IMMATURE GRANS (ABS): 0 10*3/uL (ref 0.0–0.1)
IMMATURE GRANULOCYTES: 0 %
LYMPHS: 64 %
Lymphocytes Absolute: 3.5 10*3/uL — ABNORMAL HIGH (ref 0.7–3.1)
MCH: 32.2 pg (ref 26.6–33.0)
MCHC: 33.8 g/dL (ref 31.5–35.7)
MCV: 95 fL (ref 79–97)
Monocytes Absolute: 0.2 10*3/uL (ref 0.1–0.9)
Monocytes: 4 %
NEUTROS PCT: 29 %
Neutrophils Absolute: 1.6 10*3/uL (ref 1.4–7.0)
PLATELETS: 318 10*3/uL (ref 150–379)
RBC: 4.13 x10E6/uL (ref 3.77–5.28)
RDW: 13.4 % (ref 12.3–15.4)
WBC: 5.4 10*3/uL (ref 3.4–10.8)

## 2016-02-06 LAB — COMPREHENSIVE METABOLIC PANEL
ALT: 16 IU/L (ref 0–32)
AST: 20 IU/L (ref 0–40)
Albumin/Globulin Ratio: 1.4 (ref 1.2–2.2)
Albumin: 4.4 g/dL (ref 3.5–4.8)
Alkaline Phosphatase: 44 IU/L (ref 39–117)
BUN/Creatinine Ratio: 22 (ref 12–28)
BUN: 13 mg/dL (ref 8–27)
Bilirubin Total: 0.3 mg/dL (ref 0.0–1.2)
CALCIUM: 10.2 mg/dL (ref 8.7–10.3)
CHLORIDE: 96 mmol/L (ref 96–106)
CO2: 25 mmol/L (ref 18–29)
Creatinine, Ser: 0.6 mg/dL (ref 0.57–1.00)
GFR, EST AFRICAN AMERICAN: 105 mL/min/{1.73_m2} (ref >59)
GFR, EST NON AFRICAN AMERICAN: 91 mL/min/{1.73_m2} (ref >59)
GLUCOSE: 91 mg/dL (ref 65–99)
Globulin, Total: 3.2 g/dL (ref 1.5–4.5)
Potassium: 4.7 mmol/L (ref 3.5–5.2)
Sodium: 137 mmol/L (ref 134–144)
TOTAL PROTEIN: 7.6 g/dL (ref 6.0–8.5)

## 2016-02-06 LAB — TSH: TSH: 4.01 u[IU]/mL (ref 0.450–4.500)

## 2016-02-06 NOTE — Telephone Encounter (Signed)
LMTCB 02/06/2016  Thanks,   -Mickel Baas

## 2016-02-06 NOTE — Telephone Encounter (Signed)
-----   Message from Margarita Rana, MD sent at 02/06/2016  7:32 AM EDT ----- Labs stable. Thanks.

## 2016-02-07 NOTE — Telephone Encounter (Signed)
Pt advised.   Thanks,   -Bufford Helms  

## 2016-04-19 ENCOUNTER — Other Ambulatory Visit: Payer: Self-pay | Admitting: Family Medicine

## 2016-04-19 DIAGNOSIS — I10 Essential (primary) hypertension: Secondary | ICD-10-CM

## 2016-04-19 NOTE — Telephone Encounter (Signed)
Pt saw you for acute visit on 12/26/2015. Has not followed up with new provider, and does not have FU scheduled. Renaldo Fiddler, CMA

## 2016-05-17 ENCOUNTER — Ambulatory Visit (INDEPENDENT_AMBULATORY_CARE_PROVIDER_SITE_OTHER): Payer: PPO | Admitting: Physician Assistant

## 2016-05-17 ENCOUNTER — Encounter: Payer: Self-pay | Admitting: Physician Assistant

## 2016-05-17 VITALS — BP 110/70 | HR 72 | Temp 97.7°F | Resp 16 | Wt 142.4 lb

## 2016-05-17 DIAGNOSIS — H6123 Impacted cerumen, bilateral: Secondary | ICD-10-CM

## 2016-05-17 DIAGNOSIS — I10 Essential (primary) hypertension: Secondary | ICD-10-CM

## 2016-05-17 MED ORDER — LOSARTAN POTASSIUM 50 MG PO TABS: 50.0000 mg | ORAL_TABLET | Freq: Every morning | ORAL | 1 refills | Status: DC

## 2016-05-17 NOTE — Progress Notes (Signed)
Patient: Sarah Todd Female    DOB: Mar 25, 1946   70 y.o.   MRN: [id number] Visit Date: 05/17/2016  Today's Provider: Mar Daring, PA-C   Chief Complaint  Patient presents with  . Medication Refill    Bp medication refill   Subjective:    HPI  Hypertension, follow-up:  BP Readings from Last 3 Encounters:  05/17/16 110/70  12/26/15 124/84  12/13/15 136/70   She reports excellent compliance with treatment. She is not having side effects.  She is not exercising. Very active. She is not adherent to low salt diet.   Outside blood pressures are n/a. She is experiencing none.  Patient denies chest pain, chest pressure/discomfort, exertional chest pressure/discomfort, fatigue, irregular heart beat, lower extremity edema and palpitations.   Cardiovascular risk factors include advanced age (older than 81 for men, 48 for women) and hypertension.       Weight trend: stable Wt Readings from Last 3 Encounters:  05/17/16 142 lb 6.4 oz (64.6 kg)  12/26/15 148 lb (67.1 kg)  12/13/15 149 lb (67.6 kg)    Current diet: in general, a "healthy" diet   Needs refill on the Losartan. 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  She does report having an increased itching of her ears bilaterally. She would like to have her ears checked today to make sure there is no other cause for itching.    Allergies  Allergen Reactions  . Lisinopril Anaphylaxis  . Shrimp [Shellfish Allergy] Anaphylaxis     Current Outpatient Prescriptions:  .  acetaminophen (TYLENOL) 650 MG CR tablet, Take 650 mg by mouth every 4 (four) hours as needed for pain., Disp: , Rfl:  .  aspirin 81 MG tablet, Take 81 mg by mouth daily. 1/2 tab po qd, Disp: , Rfl:  .  Calcium Citrate 200 MG TABS, Take 1 tablet by mouth daily. , Disp: , Rfl:  .  Cholecalciferol (VITAMIN D-3 PO), Take 1,000  Units by mouth daily. AM, Disp: , Rfl:  .  estradiol (ESTRACE) 1 MG tablet, TAKE ONE (1) TABLET BY MOUTH EVERY DAY, Disp: 30 tablet, Rfl: 5 .  gabapentin (NEURONTIN) 600 MG tablet, Take 600 mg by mouth 2 (two) times daily. , Disp: , Rfl:  .  losartan (COZAAR) 50 MG tablet, TAKE ONE TABLET BY MOUTH EVERY MORNING, Disp: 90 tablet, Rfl: 1 .  Multiple Vitamin (MULTIVITAMIN) capsule, Take 1 capsule by mouth daily. AM, Disp: , Rfl:  .  Omega 3 1000 MG CAPS, Take 1,000 mg by mouth daily. AM, Disp: , Rfl:  .  vitamin C (ASCORBIC ACID) 500 MG tablet, Take by mouth., Disp: , Rfl:   Review of Systems  Constitutional: Negative.   HENT: Negative for ear discharge, ear pain (itching), sinus pressure and sore throat.   Respiratory: Negative.   Cardiovascular: Negative.     Social History  Substance Use Topics  . Smoking status: Never Smoker  . Smokeless tobacco: Never Used  . Alcohol use 0.6 oz/week    1 Glasses of wine per week     Comment: occasionally   Objective:   BP 110/70 (BP Location: Left Arm, Patient Position: Sitting, Cuff Size: Normal)   Pulse 72   Temp 97.7 F (36.5 C) (Oral)   Resp 16   Wt 142 lb 6.4 oz (64.6 kg)   BMI 26.26 kg/m   Physical Exam  Constitutional: She appears well-developed and well-nourished. No  distress.  HENT:  Head: Normocephalic and atraumatic.  Right Ear: Tympanic membrane, external ear and ear canal normal.  Left Ear: Tympanic membrane, external ear and ear canal normal.  Nose: Nose normal.  Mouth/Throat: Uvula is midline, oropharynx is clear and moist and mucous membranes are normal.  Cerumen buildup noted bilaterally. Following successful ear lavage tympanic membranes were visualized and are both normal.  Neck: Normal range of motion. Neck supple. No JVD present. No tracheal deviation present. No thyromegaly present.  Cardiovascular: Normal rate, regular rhythm and normal heart sounds.  Exam reveals no gallop and no friction rub.   No murmur  heard. Pulmonary/Chest: Effort normal and breath sounds normal. No respiratory distress. She has no wheezes. She has no rales.  Lymphadenopathy:    She has no cervical adenopathy.  Skin: She is not diaphoretic.  Vitals reviewed.     Assessment & Plan:     1. Essential hypertension Stable. Diagnosis pulled for medication refill. Continue current medical treatment plan. - losartan (COZAAR) 50 MG tablet; Take 1 tablet (50 mg total) by mouth every morning.  Dispense: 90 tablet; Refill: 1  2. Cerumen impaction, bilateral Ear lavage was successful. She does continue to have some dryness in the external canal. We discussed different treatment methods to help with the itching. If this does not resolve with conservative therapy she is to call the office and we may consider adding Diprolene ointment. - Ear Lavage       Mar Daring, PA-C  [facility]

## 2016-05-17 NOTE — Patient Instructions (Signed)
DASH Eating Plan  DASH stands for "Dietary Approaches to Stop Hypertension." The DASH eating plan is a healthy eating plan that has been shown to reduce high blood pressure (hypertension). Additional health benefits may include reducing the risk of type 2 diabetes mellitus, heart disease, and stroke. The DASH eating plan may also help with weight loss.  WHAT DO I NEED TO KNOW ABOUT THE DASH EATING PLAN?  For the DASH eating plan, you will follow these general guidelines:  · Choose foods with a percent daily value for sodium of less than 5% (as listed on the food label).  · Use salt-free seasonings or herbs instead of table salt or sea salt.  · Check with your health care provider or pharmacist before using salt substitutes.  · Eat lower-sodium products, often labeled as "lower sodium" or "no salt added."  · Eat fresh foods.  · Eat more vegetables, fruits, and low-fat dairy products.  · Choose whole grains. Look for the word "whole" as the first word in the ingredient list.  · Choose fish and skinless chicken or turkey more often than red meat. Limit fish, poultry, and meat to 6 oz (170 g) each day.  · Limit sweets, desserts, sugars, and sugary drinks.  · Choose heart-healthy fats.  · Limit cheese to 1 oz (28 g) per day.  · Eat more home-cooked food and less restaurant, buffet, and fast food.  · Limit fried foods.  · Cook foods using methods other than frying.  · Limit canned vegetables. If you do use them, rinse them well to decrease the sodium.  · When eating at a restaurant, ask that your food be prepared with less salt, or no salt if possible.  WHAT FOODS CAN I EAT?  Seek help from a dietitian for individual calorie needs.  Grains  Whole grain or whole wheat bread. Brown rice. Whole grain or whole wheat pasta. Quinoa, bulgur, and whole grain cereals. Low-sodium cereals. Corn or whole wheat flour tortillas. Whole grain cornbread. Whole grain crackers. Low-sodium crackers.  Vegetables  Fresh or frozen vegetables  (raw, steamed, roasted, or grilled). Low-sodium or reduced-sodium tomato and vegetable juices. Low-sodium or reduced-sodium tomato sauce and paste. Low-sodium or reduced-sodium canned vegetables.   Fruits  All fresh, canned (in natural juice), or frozen fruits.  Meat and Other Protein Products  Ground beef (85% or leaner), grass-fed beef, or beef trimmed of fat. Skinless chicken or turkey. Ground chicken or turkey. Pork trimmed of fat. All fish and seafood. Eggs. Dried beans, peas, or lentils. Unsalted nuts and seeds. Unsalted canned beans.  Dairy  Low-fat dairy products, such as skim or 1% milk, 2% or reduced-fat cheeses, low-fat ricotta or cottage cheese, or plain low-fat yogurt. Low-sodium or reduced-sodium cheeses.  Fats and Oils  Tub margarines without trans fats. Light or reduced-fat mayonnaise and salad dressings (reduced sodium). Avocado. Safflower, olive, or canola oils. Natural peanut or almond butter.  Other  Unsalted popcorn and pretzels.  The items listed above may not be a complete list of recommended foods or beverages. Contact your dietitian for more options.  WHAT FOODS ARE NOT RECOMMENDED?  Grains  White bread. White pasta. White rice. Refined cornbread. Bagels and croissants. Crackers that contain trans fat.  Vegetables  Creamed or fried vegetables. Vegetables in a cheese sauce. Regular canned vegetables. Regular canned tomato sauce and paste. Regular tomato and vegetable juices.  Fruits  Dried fruits. Canned fruit in light or heavy syrup. Fruit juice.  Meat and Other Protein   Products  Fatty cuts of meat. Ribs, chicken wings, bacon, sausage, bologna, salami, chitterlings, fatback, hot dogs, bratwurst, and packaged luncheon meats. Salted nuts and seeds. Canned beans with salt.  Dairy  Whole or 2% milk, cream, half-and-half, and cream cheese. Whole-fat or sweetened yogurt. Full-fat cheeses or blue cheese. Nondairy creamers and whipped toppings. Processed cheese, cheese spreads, or cheese  curds.  Condiments  Onion and garlic salt, seasoned salt, table salt, and sea salt. Canned and packaged gravies. Worcestershire sauce. Tartar sauce. Barbecue sauce. Teriyaki sauce. Soy sauce, including reduced sodium. Steak sauce. Fish sauce. Oyster sauce. Cocktail sauce. Horseradish. Ketchup and mustard. Meat flavorings and tenderizers. Bouillon cubes. Hot sauce. Tabasco sauce. Marinades. Taco seasonings. Relishes.  Fats and Oils  Butter, stick margarine, lard, shortening, ghee, and bacon fat. Coconut, palm kernel, or palm oils. Regular salad dressings.  Other  Pickles and olives. Salted popcorn and pretzels.  The items listed above may not be a complete list of foods and beverages to avoid. Contact your dietitian for more information.  WHERE CAN I FIND MORE INFORMATION?  National Heart, Lung, and Blood Institute: www.nhlbi.nih.gov/health/health-topics/topics/dash/     This information is not intended to replace advice given to you by your health care provider. Make sure you discuss any questions you have with your health care provider.     Document Released: 08/08/2011 Document Revised: 09/09/2014 Document Reviewed: 06/23/2013  Elsevier Interactive Patient Education ©2016 [employer]

## 2016-06-03 DIAGNOSIS — H524 Presbyopia: Secondary | ICD-10-CM | POA: Diagnosis not present

## 2016-08-08 ENCOUNTER — Ambulatory Visit (INDEPENDENT_AMBULATORY_CARE_PROVIDER_SITE_OTHER): Payer: PPO | Admitting: Physician Assistant

## 2016-08-08 ENCOUNTER — Encounter: Payer: Self-pay | Admitting: Physician Assistant

## 2016-08-08 VITALS — BP 120/72 | HR 88 | Temp 97.5°F | Resp 16 | Wt 139.0 lb

## 2016-08-08 DIAGNOSIS — M7711 Lateral epicondylitis, right elbow: Secondary | ICD-10-CM | POA: Diagnosis not present

## 2016-08-08 MED ORDER — MELOXICAM 15 MG PO TABS: 15.0000 mg | ORAL_TABLET | Freq: Every day | ORAL | 0 refills | Status: DC

## 2016-08-08 NOTE — Patient Instructions (Signed)
Tennis Elbow Tennis elbow is puffiness (inflammation) of the outer tendons of your forearm close to your elbow. Your tendons attach your muscles to your bones. Tennis elbow can happen in any sport or job in which you use your elbow too much. It is caused by doing the same motion over and over. Tennis elbow can cause:  Pain and tenderness in your forearm and the outer part of your elbow.  A burning feeling. This runs from your elbow through your arm.  Weak grip in your hands. Follow these instructions at home: Activity  Rest your elbow and wrist as told by your doctor. Try to avoid any activities that caused the problem until your doctor says that you can do them again.  If a physical therapist teaches you exercises, do all of them as told.  If you lift an object, lift it with your palm facing up. This is easier on your elbow. Lifestyle  If your tennis elbow is caused by sports, check your equipment and make sure that:  You are using it correctly.  It fits you well.  If your tennis elbow is caused by work, take breaks often, if you are able. Talk with your manager about doing your work in a way that is safe for you.  If your tennis elbow is caused by computer use, talk with your manager about any changes that can be made to your work setup. General instructions  If told, apply ice to the painful area:  Put ice in a plastic bag.  Place a towel between your skin and the bag.  Leave the ice on for 20 minutes, 2-3 times per day.  Take medicines only as told by your doctor.  If you were given a brace, wear it as told by your doctor.  Keep all follow-up visits as told by your doctor. This is important. Contact a doctor if:  Your pain does not get better with treatment.  Your pain gets worse.  You have weakness in your forearm, hand, or fingers.  You cannot feel your forearm, hand, or fingers. This information is not intended to replace advice given to you by your health  care provider. Make sure you discuss any questions you have with your health care provider. Document Released: 02/06/2010 Document Revised: 04/18/2016 Document Reviewed: 08/15/2014 Elsevier Interactive Patient Education  2017 [employer].  

## 2016-08-08 NOTE — Progress Notes (Signed)
Patient: Sarah Todd Female    DOB: 1945/11/23   70 y.o.   MRN: [id number] Visit Date: 08/08/2016  Today's Provider: Mar Daring, PA-C   Chief Complaint  Patient presents with  . Elbow Pain   Subjective:    HPI   Elbow Pain Pt c/o right elbow for for about 2 weeks. There was no known injury to the area. The pain does radiate down the right arm, but does not go into the hand. Pt does report there is some swelling, but denies bruising. Pt reports the pain is an "ache". 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 Pt has also tried Tylenol Arthritis, with some short term relief. Pt rates the pain as being a 6 or 7/10 on a pain scale. Pain is constant, but changes in sharpness and severity.    Allergies  Allergen Reactions  . Lisinopril Anaphylaxis  . Shrimp [Shellfish Allergy] Anaphylaxis     Current Outpatient Prescriptions:  .  acetaminophen (TYLENOL) 650 MG CR tablet, Take 650 mg by mouth every 4 (four) hours as needed for pain., Disp: , Rfl:  .  aspirin 81 MG tablet, Take 81 mg by mouth daily. 1/2 tab po qd, Disp: , Rfl:  .  Calcium Citrate 200 MG TABS, Take 1 tablet by mouth daily. , Disp: , Rfl:  .  Cholecalciferol (VITAMIN D-3 PO), Take 1,000 Units by mouth daily. AM, Disp: , Rfl:  .  estradiol (ESTRACE) 1 MG tablet, TAKE ONE (1) TABLET BY MOUTH EVERY DAY, Disp: 30 tablet, Rfl: 5 .  gabapentin (NEURONTIN) 600 MG tablet, Take 600 mg by mouth 2 (two) times daily. , Disp: , Rfl:  .  losartan (COZAAR) 50 MG tablet, Take 1 tablet (50 mg total) by mouth every morning., Disp: 90 tablet, Rfl: 1 .  Multiple Vitamin (MULTIVITAMIN) capsule, Take 1 capsule by mouth daily. AM, Disp: , Rfl:  .  Omega 3 1000 MG CAPS, Take 1,000 mg by mouth daily. AM, Disp: , Rfl:  .  vitamin C (ASCORBIC ACID) 500 MG tablet, Take by mouth., Disp: , Rfl:   Review of Systems  Constitutional: Negative for activity change, appetite change,  chills, diaphoresis, fatigue, fever and unexpected weight change.  Respiratory: Negative for cough, shortness of breath and wheezing.   Cardiovascular: Negative for chest pain, palpitations and leg swelling.  Musculoskeletal: Positive for arthralgias (right elbow) and joint swelling. Negative for myalgias.  Neurological: Negative for weakness and numbness.    Social History  Substance Use Topics  . Smoking status: Never Smoker  . Smokeless tobacco: Never Used  . Alcohol use No   Objective:   BP 120/72 (BP Location: Left Arm, Patient Position: Sitting, Cuff Size: Normal)   Pulse 88   Temp 97.5 F (36.4 C) (Oral)   Resp 16   Wt 139 lb (63 kg)   BMI 25.63 kg/m   Physical Exam  Constitutional: She appears well-developed and well-nourished. No distress.  Neck: Normal range of motion. Neck supple.  Cardiovascular: Normal rate, regular rhythm and normal heart sounds.  Exam reveals no gallop and no friction rub.   No murmur heard. Pulmonary/Chest: Effort normal and breath sounds normal. No respiratory distress. She has no wheezes. She has no rales.  Musculoskeletal:       Right elbow: She exhibits swelling. She exhibits normal range of motion and no effusion. Tenderness found. Lateral epicondyle tenderness noted.  Left elbow: Normal.       Right wrist: Normal.       Left wrist: Normal.  Skin: She is not diaphoretic.  Vitals reviewed.     Assessment & Plan:     1. Right lateral epicondylitis Being that she has only used Tylenol and only uses intermittently I will switch therapy to meloxicam as below. Also discussed using a tennis elbow brace. Patient may also apply ice to the area. She is to call the office if symptoms do not improve with the two-week treatment and we will consider cortisone injection at that time. - meloxicam (MOBIC) 15 MG tablet; Take 1 tablet (15 mg total) by mouth daily.  Dispense: 15 tablet; Refill: 0     Patient seen and examined by Fenton Malling,  PA, and note scribed by Renaldo Fiddler, CMA.   Mar Daring, PA-C  [facility]

## 2016-09-05 ENCOUNTER — Ambulatory Visit: Payer: PPO | Admitting: Physician Assistant

## 2016-09-10 ENCOUNTER — Ambulatory Visit (INDEPENDENT_AMBULATORY_CARE_PROVIDER_SITE_OTHER): Payer: PPO | Admitting: Physician Assistant

## 2016-09-10 ENCOUNTER — Encounter: Payer: Self-pay | Admitting: Physician Assistant

## 2016-09-10 VITALS — BP 130/80 | HR 84 | Temp 98.1°F | Resp 16 | Wt 138.0 lb

## 2016-09-10 DIAGNOSIS — L233 Allergic contact dermatitis due to drugs in contact with skin: Secondary | ICD-10-CM

## 2016-09-10 MED ORDER — PREDNISONE 10 MG (21) PO TBPK: ORAL_TABLET | ORAL | 0 refills | Status: DC

## 2016-09-10 NOTE — Patient Instructions (Addendum)
6 day prednisone taper that you will take as directed on package instructions. May use topical benadryl cream with hydrocortisone cream as needed for itch. May take oral benadryl 25mg  at bedtime for itching as needed.  Contact Dermatitis Introduction Dermatitis is redness, soreness, and swelling (inflammation) of the skin. Contact dermatitis is a reaction to certain substances that touch the skin. There are two types of contact dermatitis:  Irritant contact dermatitis. This type is caused by something that irritates your skin, such as dry hands from washing them too much. This type does not require previous exposure to the substance for a reaction to occur. This type is more common.  Allergic contact dermatitis. This type is caused by a substance that you are allergic to, such as a nickel allergy or poison ivy. This type only occurs if you have been exposed to the substance (allergen) before. Upon a repeat exposure, your body reacts to the substance. This type is less common. What are the causes? Many different substances can cause contact dermatitis. Irritant contact dermatitis is most commonly caused by exposure to:  Makeup.  Soaps.  Detergents.  Bleaches.  Acids.  Metal salts, such as nickel. Allergic contact dermatitis is most commonly caused by exposure to:  Poisonous plants.  Chemicals.  Jewelry.  Latex.  Medicines.  Preservatives in products, such as clothing. What increases the risk? This condition is more likely to develop in:  People who have jobs that expose them to irritants or allergens.  People who have certain medical conditions, such as asthma or eczema. What are the signs or symptoms? Symptoms of this condition may occur anywhere on your body where the irritant has touched you or is touched by you. Symptoms include:  Dryness or flaking.  Redness.  Cracks.  Itching.  Pain or a burning feeling.  Blisters.  Drainage of small amounts of blood or  clear fluid from skin cracks. With allergic contact dermatitis, there may also be swelling in areas such as the eyelids, mouth, or genitals. How is this diagnosed? This condition is diagnosed with a medical history and physical exam. A patch skin test may be performed to help determine the cause. If the condition is related to your job, you may need to see an occupational medicine specialist. How is this treated? Treatment for this condition includes figuring out what caused the reaction and protecting your skin from further contact. Treatment may also include:  Steroid creams or ointments. Oral steroid medicines may be needed in more severe cases.  Antibiotics or antibacterial ointments, if a skin infection is present.  Antihistamine lotion or an antihistamine taken by mouth to ease itching.  A bandage (dressing). Follow these instructions at home: [place] your skin as needed.  Apply cool compresses to the affected areas.  Try taking a bath with:  Epsom salts. Follow the instructions on the packaging. You can get these at your local pharmacy or grocery store.  Baking soda. Pour a small amount into the bath as directed by your health care provider.  Colloidal oatmeal. Follow the instructions on the packaging. You can get this at your local pharmacy or grocery store.  Try applying baking soda paste to your skin. Stir water into baking soda until it reaches a paste-like consistency.  Do not scratch your skin.  Bathe less frequently, such as every other day.  Bathe in lukewarm water. Avoid using hot water. Medicines  Take or apply over-the-counter and prescription medicines only as told by your health  care provider.  If you were prescribed an antibiotic medicine, take or apply your antibiotic as told by your health care provider. Do not stop using the antibiotic even if your condition starts to improve. General instructions  Keep all follow-up visits as told by  your health care provider. This is important.  Avoid the substance that caused your reaction. If you do not know what caused it, keep a journal to try to track what caused it. Write down:  What you eat.  What cosmetic products you use.  What you drink.  What you wear in the affected area. This includes jewelry.  If you were given a dressing, take care of it as told by your health care provider. This includes when to change and remove it. Contact a health care provider if:  Your condition does not improve with treatment.  Your condition gets worse.  You have signs of infection such as swelling, tenderness, redness, soreness, or warmth in the affected area.  You have a fever.  You have new symptoms. Get help right away if:  You have a severe headache, neck pain, or neck stiffness.  You vomit.  You feel very sleepy.  You notice red streaks coming from the affected area.  Your bone or joint underneath the affected area becomes painful after the skin has healed.  The affected area turns darker.  You have difficulty breathing. This information is not intended to replace advice given to you by your health care provider. Make sure you discuss any questions you have with your health care provider. Document Released: 08/16/2000 Document Revised: 01/25/2016 Document Reviewed: 01/04/2015  2017 Elsevier

## 2016-09-10 NOTE — Progress Notes (Signed)
Patient: Sarah Todd Female    DOB: 29-Apr-1946   71 y.o.   MRN: [id number] Visit Date: 09/10/2016  Today's Provider: Mar Daring, PA-C   Chief Complaint  Patient presents with  . Follow-up   Subjective:    HPI  Follow up for right elbow pain  The patient was last seen for this 4 weeks ago. Changes made at last visit include start meloxicam.  She reports excellent compliance with treatment. She feels that condition is Unchanged. She is not having side effects.  ------------------------------------------------------------------------------------  Patient c/o of rash and itching on right elbow and arm for about one week. Patient reports she put on aspercreme on elbow to help with pain and then covered with a brace. Patient reports skin was irritated right after putting the brace on. Patient reports she has used several OTC creams to help with rash and itching reports no improvement. Patient reports rash is spreading and denies pain.      Allergies  Allergen Reactions  . Lisinopril Anaphylaxis  . Shrimp [Shellfish Allergy] Anaphylaxis     Current Outpatient Prescriptions:  .  acetaminophen (TYLENOL) 650 MG CR tablet, Take 650 mg by mouth every 4 (four) hours as needed for pain., Disp: , Rfl:  .  aspirin 81 MG tablet, Take 81 mg by mouth daily. 1/2 tab po qd, Disp: , Rfl:  .  Calcium Citrate 200 MG TABS, Take 1 tablet by mouth daily. , Disp: , Rfl:  .  Cholecalciferol (VITAMIN D-3 PO), Take 1,000 Units by mouth daily. AM, Disp: , Rfl:  .  estradiol (ESTRACE) 1 MG tablet, TAKE ONE (1) TABLET BY MOUTH EVERY DAY, Disp: 30 tablet, Rfl: 5 .  gabapentin (NEURONTIN) 600 MG tablet, Take 600 mg by mouth 2 (two) times daily. , Disp: , Rfl:  .  losartan (COZAAR) 50 MG tablet, Take 1 tablet (50 mg total) by mouth every morning., Disp: 90 tablet, Rfl: 1 .  Multiple Vitamin (MULTIVITAMIN) capsule, Take 1 capsule by mouth daily. AM, Disp: , Rfl:  .  Omega 3 1000 MG  CAPS, Take 1,000 mg by mouth daily. AM, Disp: , Rfl:  .  vitamin C (ASCORBIC ACID) 500 MG tablet, Take by mouth., Disp: , Rfl:  .  meloxicam (MOBIC) 15 MG tablet, Take 1 tablet (15 mg total) by mouth daily. (Patient not taking: Reported on 09/10/2016), Disp: 15 tablet, Rfl: 0  Review of Systems  Constitutional: Negative.   Respiratory: Negative.   Cardiovascular: Negative.   Gastrointestinal: Negative.   Musculoskeletal: Positive for arthralgias.  Skin: Positive for rash.    Social History  Substance Use Topics  . Smoking status: Never Smoker  . Smokeless tobacco: Never Used  . Alcohol use No   Objective:   BP 130/80 (BP Location: Left Arm, Patient Position: Sitting, Cuff Size: Normal)   Pulse 84   Temp 98.1 F (36.7 C) (Oral)   Resp 16   Wt 138 lb (62.6 kg)   BMI 25.45 kg/m   Physical Exam  Constitutional: She appears well-developed and well-nourished. No distress.  Cardiovascular: Normal rate, regular rhythm and normal heart sounds.  Exam reveals no gallop and no friction rub.   No murmur heard. Pulmonary/Chest: Effort normal and breath sounds normal. No respiratory distress. She has no wheezes. She has no rales.  Skin: She is not diaphoretic.     Vitals reviewed.     Assessment & Plan:     1. Allergic contact dermatitis  due to drugs in contact with skin I will give a 6 day prednisone taper for the contact dermatitis secondary to Aspercreme application. Patient is to wash the brace with soap and water and allow to dry before reapplying. Patient may use Benadryl topical cream with hydrocortisone cream for local itch. Patient may also use oral Benadryl at bedtime for itch. Patient was advised of drowsiness precautions with oral Benadryl. I will see her back in approximately 2 weeks to allow for this to clear and consider cortisone injection for the right lateral epicondylitis that she is continuing to have. - predniSONE (STERAPRED UNI-PAK 21 TAB) 10 MG (21) TBPK tablet; Take  as directed on package instructions  Dispense: 21 tablet; Refill: 0       Mar Daring, PA-C  [facility]

## 2016-09-13 DIAGNOSIS — L218 Other seborrheic dermatitis: Secondary | ICD-10-CM | POA: Diagnosis not present

## 2016-10-02 ENCOUNTER — Encounter: Payer: Self-pay | Admitting: Physician Assistant

## 2016-10-02 ENCOUNTER — Ambulatory Visit (INDEPENDENT_AMBULATORY_CARE_PROVIDER_SITE_OTHER): Payer: PPO | Admitting: Physician Assistant

## 2016-10-02 VITALS — BP 122/70 | HR 60 | Resp 16 | Wt 136.0 lb

## 2016-10-02 DIAGNOSIS — M7711 Lateral epicondylitis, right elbow: Secondary | ICD-10-CM

## 2016-10-02 MED ORDER — MELOXICAM 15 MG PO TABS: 15.0000 mg | ORAL_TABLET | Freq: Every day | ORAL | 1 refills | Status: DC

## 2016-10-02 MED ORDER — METHYLPREDNISOLONE ACETATE 40 MG/ML IJ SUSP
40.0000 mg | Freq: Once | INTRAMUSCULAR | Status: AC
  Administered 2016-10-02: 40 mg via INTRA_ARTICULAR

## 2016-10-02 NOTE — Patient Instructions (Signed)
Elbow Injection, Care After Refer to this sheet in the next few weeks. These instructions provide you with information about caring for yourself after your procedure. Your health care provider may also give you more specific instructions. Your treatment has been planned according to current medical practices, but problems sometimes occur. Call your health care provider if you have any problems or questions after your procedure. What can I expect after the procedure? After the procedure, it is common to have:  Soreness.  Warmth.  Swelling. You may have more pain, swelling, and warmth than you did before the injection. This reaction may last for about one day. Follow these instructions at home: Bathing  If you were given a bandage (dressing), keep it dry until your health care provider says it can be removed. Ask your health care provider when you can start showering or taking a bath. Managing pain, stiffness, and swelling  If directed, apply ice to the injection area:  Put ice in a plastic bag.  Place a towel between your skin and the bag.  Leave the ice on for 20 minutes, 2-3 times per day.  Do not apply heat to your elbow.  Raise the injection area above the level of your heart while you are sitting or lying down. Activity  Avoid strenuous activities for as long as directed by your health care provider. Ask your health care provider when you can return to your normal activities. General instructions  Take medicines only as directed by your health care provider.  Do not take aspirin or other over-the-counter medicines unless your health care provider says you can.  Check your injection site every day for signs of infection. Watch for:  Redness, swelling, or pain.  Fluid, blood, or pus.  Follow your health care provider's instructions about dressing changes and removal. Contact a health care provider if:  You have symptoms at your injection site that last longer than two  days after your procedure.  You have redness, swelling, or pain in your injection area.  You have fluid, blood, or pus coming from your injection site.  You have warmth in your injection area.  You have a fever.  Your pain is not controlled with medicine. Get help right away if:  Your elbow turns very red.  Your elbow becomes very swollen.  Your elbow pain is severe. This information is not intended to replace advice given to you by your health care provider. Make sure you discuss any questions you have with your health care provider. Document Released: 09/09/2014 Document Revised: 04/24/2016 Document Reviewed: 06/29/2014 Elsevier Interactive Patient Education  2017 Elsevier Inc  

## 2016-10-02 NOTE — Progress Notes (Signed)
Patient: Sarah Todd Female    DOB: 04/05/1946   71 y.o.   MRN: [id number] Visit Date: 10/02/2016  Today's Provider: Mar Daring, PA-C   Chief Complaint  Patient presents with  . Follow-up   Subjective:    HPI  Follow up for right lateral epicondylitis  The patient was last seen for this 1 months ago. Changes made at last visit include start meloxicam.  She reports excellent compliance with treatment. She feels that condition is Unchanged. Patient reports she still has to wear a brace. Patient reports that pain did improve while on meloxicam.  She is not having side effects.   ------------------------------------------------------------------------------------     Allergies  Allergen Reactions  . Lisinopril Anaphylaxis  . Shrimp [Shellfish Allergy] Anaphylaxis  . Aspercreme [Trolamine Salicylate] Rash   Patient Active Problem List   Diagnosis Date Noted  . Candidiasis of breast 07/20/2015  . Atrophic vaginitis 07/20/2015  . Constipation 06/20/2015  . Skin lesion of face 06/20/2015  . Tinea corporis 05/19/2015  . Allergic rhinitis 02/06/2015  . Arthritis 02/06/2015  . BP (high blood pressure) 02/06/2015  . OP (osteoporosis) 02/06/2015  . Shoulder strain 02/06/2015  . Dermatophytosis of groin 02/06/2015  . Candida vaginitis 02/06/2015    Current Outpatient Prescriptions:  .  acetaminophen (TYLENOL) 650 MG CR tablet, Take 650 mg by mouth every 4 (four) hours as needed for pain., Disp: , Rfl:  .  aspirin 81 MG tablet, Take 81 mg by mouth daily. 1/2 tab po qd, Disp: , Rfl:  .  Calcium Citrate 200 MG TABS, Take 1 tablet by mouth daily. , Disp: , Rfl:  .  Cholecalciferol (VITAMIN D-3 PO), Take 1,000 Units by mouth daily. AM, Disp: , Rfl:  .  estradiol (ESTRACE) 1 MG tablet, TAKE ONE (1) TABLET BY MOUTH EVERY DAY, Disp: 30 tablet, Rfl: 5 .  gabapentin (NEURONTIN) 600 MG tablet, Take 600 mg by mouth 2 (two) times daily. , Disp: , Rfl:  .   losartan (COZAAR) 50 MG tablet, Take 1 tablet (50 mg total) by mouth every morning., Disp: 90 tablet, Rfl: 1 .  Multiple Vitamin (MULTIVITAMIN) capsule, Take 1 capsule by mouth daily. AM, Disp: , Rfl:  .  Omega 3 1000 MG CAPS, Take 1,000 mg by mouth daily. AM, Disp: , Rfl:  .  vitamin C (ASCORBIC ACID) 500 MG tablet, Take by mouth., Disp: , Rfl:  .  meloxicam (MOBIC) 15 MG tablet, Take 1 tablet (15 mg total) by mouth daily. (Patient not taking: Reported on 10/02/2016), Disp: 15 tablet, Rfl: 0  Review of Systems  Constitutional: Negative.   Respiratory: Negative.   Cardiovascular: Negative.   Musculoskeletal: Positive for arthralgias.  Neurological: Negative.     Social History  Substance Use Topics  . Smoking status: Never Smoker  . Smokeless tobacco: Never Used  . Alcohol use No   Objective:   BP 122/70 (BP Location: Left Arm, Patient Position: Sitting, Cuff Size: Normal)   Pulse 60   Resp 16   Wt 136 lb (61.7 kg)   BMI 25.08 kg/m   Physical Exam  Constitutional: She appears well-developed and well-nourished. No distress.  Neck: Normal range of motion. Neck supple.  Cardiovascular: Normal rate, regular rhythm and normal heart sounds.  Exam reveals no gallop and no friction rub.   No murmur heard. Pulmonary/Chest: Effort normal and breath sounds normal. No respiratory distress. She has no wheezes. She has no rales.  Musculoskeletal:  Right elbow: She exhibits decreased range of motion and swelling. She exhibits no effusion. Tenderness found. Lateral epicondyle tenderness noted.  Skin: She is not diaphoretic.  Vitals reviewed.      Assessment & Plan:     1. Right lateral epicondylitis Cortisone injection given today to right lateral epicondylitis (see procedure note below). Meloxicam refilled for her to start tomorrow if needed. Tylenol today. Ice if needed. After care instructions printed for patient. I will see her back in 4 weeks if needed. - meloxicam (MOBIC) 15 MG  tablet; Take 1 tablet (15 mg total) by mouth daily.  Dispense: 30 tablet; Refill: 1 - methylPREDNISolone acetate (DEPO-MEDROL) injection 40 mg; Inject 1 mL (40 mg total) into the articular space once.  Procedure Note: Benefits, risks (including infection, tattooing, adipose dimpling, and tendon rupture) and alternatives were explained to the patient. All questions were sought and answered.  Patient agreed to continue and verbal consent was obtained.   A steroid injection was performed on right extensor tendons of the right elbow using 2cc of 1% plain Xyloocaine and 40 mg of depo-medrol. This was well tolerated. A dry dressing was applied. After care instructions were printed and given to patient.       Mar Daring, PA-C  [facility]

## 2016-10-31 ENCOUNTER — Encounter: Payer: Self-pay | Admitting: Physician Assistant

## 2016-10-31 ENCOUNTER — Ambulatory Visit (INDEPENDENT_AMBULATORY_CARE_PROVIDER_SITE_OTHER): Payer: PPO | Admitting: Physician Assistant

## 2016-10-31 VITALS — BP 110/66 | HR 86 | Temp 97.4°F | Wt 135.2 lb

## 2016-10-31 DIAGNOSIS — M7711 Lateral epicondylitis, right elbow: Secondary | ICD-10-CM

## 2016-10-31 MED ORDER — MELOXICAM 15 MG PO TABS: 15.0000 mg | ORAL_TABLET | Freq: Every day | ORAL | 1 refills | Status: DC

## 2016-10-31 NOTE — Patient Instructions (Signed)
Tennis Elbow Tennis elbow (lateral epicondylitis) is inflammation of the outer tendons of your forearm close to your elbow. Your tendons attach your muscles to your bones. The outer tendons of your forearm are used to extend your wrist, and they attach on the outside part of your elbow. Tennis elbow is often found in people who play tennis, but anyone may get the condition from repeatedly extending the wrist or turning the forearm. What are the causes? This condition is caused by repeatedly extending your wrist and using your hands. It can result from sports or work that requires repetitive forearm movements. Tennis elbow may also be caused by an injury. What increases the risk? You have a higher risk of developing tennis elbow if you play tennis or another racquet sport. You also have a higher risk if you frequently use your hands for work. This condition is also more likely to develop in:  Musicians.  Carpenters, painters, and plumbers.  Cooks.  Cashiers.  People who work in factories.  Construction workers.  Butchers.  People who use computers. What are the signs or symptoms? Symptoms of this condition include:  Pain and tenderness in your forearm and the outer part of your elbow. You may only feel the pain when you use your arm, or you may feel it even when you are not using your arm.  A burning feeling that runs from your elbow through your arm.  Weak grip in your hands. How is this diagnosed? This condition may be diagnosed by medical history and physical exam. You may also have other tests, including:  X-rays.  MRI. How is this treated? Your health care provider will recommend lifestyle adjustments, such as resting and icing your arm. Treatment may also include:  Medicines for inflammation. This may include shots of cortisone if your pain continues.  Physical therapy. This may include massage or exercises.  An elbow brace. Surgery may eventually be recommended if  your pain does not go away with treatment. Follow these instructions at home: Activity  Rest your elbow and wrist as directed by your health care provider. Try to avoid any activities that caused the problem until your health care provider says that you can do them again.  If a physical therapist teaches you exercises, do all of them as directed.  If you lift an object, lift it with your palm facing upward. This lowers the stress on your elbow. Lifestyle  If your tennis elbow is caused by sports, check your equipment and make sure that:  You are using it correctly.  It is the best fit for you.  If your tennis elbow is caused by work, take breaks frequently, if you are able. Talk with your manager about how to best perform tasks in a way that is safe.  If your tennis elbow is caused by computer use, talk with your manager about any changes that can be made to your work environment. General instructions  If directed, apply ice to the painful area:  Put ice in a plastic bag.  Place a towel between your skin and the bag.  Leave the ice on for 20 minutes, 2-3 times per day.  Take medicines only as directed by your health care provider.  If you were given a brace, wear it as directed by your health care provider.  Keep all follow-up visits as directed by your health care provider. This is important. Contact a health care provider if:  Your pain does not get better with treatment.    Your pain gets worse.  You have numbness or weakness in your forearm, hand, or fingers. This information is not intended to replace advice given to you by your health care provider. Make sure you discuss any questions you have with your health care provider. Document Released: 08/19/2005 Document Revised: 04/18/2016 Document Reviewed: 08/15/2014 Elsevier Interactive Patient Education  2017 Elsevier Inc.  

## 2016-10-31 NOTE — Progress Notes (Signed)
Patient: Sarah Todd Female    DOB: [birth year]   71 y.o.   MRN: [id number] Visit Date: 10/31/2016  Today's Provider: Mar Daring, PA-C   Chief Complaint  Patient presents with  . Follow-up   Subjective:    HPI  Follow up for right elbow pain  The patient was last seen for this 4 weeks ago. Changes made at last visit include pt was given an injection of cortisone and advised to continue with meloxicam 15 mg daily.  She reports excellent compliance with treatment. She feels that condition is Improved, but she still feels the elbow pain on occasion.  She is not having side effects.  ------------------------------------------------------------------------------------    Allergies  Allergen Reactions  . Lisinopril Anaphylaxis  . Shrimp [Shellfish Allergy] Anaphylaxis  . Aspercreme [Trolamine Salicylate] Rash     Current Outpatient Prescriptions:  .  acetaminophen (TYLENOL) 650 MG CR tablet, Take 650 mg by mouth every 4 (four) hours as needed for pain., Disp: , Rfl:  .  aspirin 81 MG tablet, Take 81 mg by mouth daily. 1/2 tab po qd, Disp: , Rfl:  .  Calcium Citrate 200 MG TABS, Take 1 tablet by mouth daily. , Disp: , Rfl:  .  Cholecalciferol (VITAMIN D-3 PO), Take 1,000 Units by mouth daily. AM, Disp: , Rfl:  .  estradiol (ESTRACE) 1 MG tablet, TAKE ONE (1) TABLET BY MOUTH EVERY DAY, Disp: 30 tablet, Rfl: 5 .  gabapentin (NEURONTIN) 600 MG tablet, Take 600 mg by mouth 2 (two) times daily. , Disp: , Rfl:  .  losartan (COZAAR) 50 MG tablet, Take 1 tablet (50 mg total) by mouth every morning., Disp: 90 tablet, Rfl: 1 .  meloxicam (MOBIC) 15 MG tablet, Take 1 tablet (15 mg total) by mouth daily., Disp: 30 tablet, Rfl: 1 .  Multiple Vitamin (MULTIVITAMIN) capsule, Take 1 capsule by mouth daily. AM, Disp: , Rfl:  .  Omega 3 1000 MG CAPS, Take 1,000 mg by mouth daily. AM, Disp: , Rfl:  .  vitamin C (ASCORBIC ACID) 500 MG tablet, Take by mouth., Disp: , Rfl:    Review of Systems  Constitutional: Negative.   Respiratory: Negative.   Cardiovascular: Negative.   Musculoskeletal: Positive for arthralgias and joint swelling.  Neurological: Negative for weakness and numbness.    Social History  Substance Use Topics  . Smoking status: Never Smoker  . Smokeless tobacco: Never Used  . Alcohol use No   Objective:   BP 110/66 (BP Location: Left Arm, Patient Position: Sitting, Cuff Size: Normal)   Pulse 86   Temp 97.4 F (36.3 C) (Oral)   Wt 135 lb 3.2 oz (61.3 kg)   SpO2 99%   BMI 24.93 kg/m  Vitals:   10/31/16 1352  BP: 110/66  Pulse: 86  Temp: 97.4 F (36.3 C)  TempSrc: Oral  SpO2: 99%  Weight: 135 lb 3.2 oz (61.3 kg)     Physical Exam  Constitutional: She appears well-developed and well-nourished. No distress.  Neck: Normal range of motion. Neck supple.  Cardiovascular: Normal rate, regular rhythm and normal heart sounds.  Exam reveals no gallop and no friction rub.   No murmur heard. Pulmonary/Chest: Effort normal and breath sounds normal. No respiratory distress. She has no wheezes. She has no rales.  Musculoskeletal:       Right shoulder: Normal.       Right elbow: She exhibits normal range of motion, no swelling, no effusion, no  deformity and no laceration. Tenderness found. Lateral epicondyle tenderness noted.       Right wrist: Normal.  Skin: She is not diaphoretic.  Vitals reviewed.     Assessment & Plan:     1. Right lateral epicondylitis Since patient is still having symptoms following cortisone injection and continued meloxicam I will refer to Orthopedics for further evaluation. Prefer [facility] if they are accepting her insurance. Continue meloxicam for now. Call if symptoms worsen. - Ambulatory referral to Orthopedic Surgery - meloxicam (MOBIC) 15 MG tablet; Take 1 tablet (15 mg total) by mouth daily.  Dispense: 30 tablet; Refill: Sim[place], PA-C  [facility]

## 2016-11-07 ENCOUNTER — Other Ambulatory Visit: Payer: Self-pay

## 2016-11-07 DIAGNOSIS — M7711 Lateral epicondylitis, right elbow: Secondary | ICD-10-CM | POA: Diagnosis not present

## 2016-11-07 DIAGNOSIS — M25529 Pain in unspecified elbow: Secondary | ICD-10-CM | POA: Insufficient documentation

## 2016-11-07 NOTE — Progress Notes (Signed)
error 

## 2016-11-22 DIAGNOSIS — Z79899 Other long term (current) drug therapy: Secondary | ICD-10-CM | POA: Diagnosis not present

## 2016-11-22 DIAGNOSIS — M792 Neuralgia and neuritis, unspecified: Secondary | ICD-10-CM | POA: Diagnosis not present

## 2016-11-22 DIAGNOSIS — M5416 Radiculopathy, lumbar region: Secondary | ICD-10-CM | POA: Diagnosis not present

## 2016-11-25 DIAGNOSIS — M7711 Lateral epicondylitis, right elbow: Secondary | ICD-10-CM | POA: Diagnosis not present

## 2016-12-13 ENCOUNTER — Encounter: Payer: Self-pay | Admitting: Physician Assistant

## 2016-12-13 ENCOUNTER — Ambulatory Visit (INDEPENDENT_AMBULATORY_CARE_PROVIDER_SITE_OTHER): Payer: PPO | Admitting: Physician Assistant

## 2016-12-13 VITALS — BP 120/80 | HR 92 | Temp 98.1°F | Resp 16 | Ht 62.0 in | Wt 134.4 lb

## 2016-12-13 DIAGNOSIS — M7711 Lateral epicondylitis, right elbow: Secondary | ICD-10-CM | POA: Diagnosis not present

## 2016-12-13 DIAGNOSIS — Z1239 Encounter for other screening for malignant neoplasm of breast: Secondary | ICD-10-CM

## 2016-12-13 DIAGNOSIS — Z780 Asymptomatic menopausal state: Secondary | ICD-10-CM | POA: Diagnosis not present

## 2016-12-13 DIAGNOSIS — I10 Essential (primary) hypertension: Secondary | ICD-10-CM

## 2016-12-13 DIAGNOSIS — Z0000 Encounter for general adult medical examination without abnormal findings: Secondary | ICD-10-CM | POA: Diagnosis not present

## 2016-12-13 DIAGNOSIS — Z1231 Encounter for screening mammogram for malignant neoplasm of breast: Secondary | ICD-10-CM | POA: Diagnosis not present

## 2016-12-13 DIAGNOSIS — Z136 Encounter for screening for cardiovascular disorders: Secondary | ICD-10-CM | POA: Diagnosis not present

## 2016-12-13 DIAGNOSIS — Z13220 Encounter for screening for lipoid disorders: Secondary | ICD-10-CM | POA: Diagnosis not present

## 2016-12-13 MED ORDER — LOSARTAN POTASSIUM 50 MG PO TABS: 50.0000 mg | ORAL_TABLET | Freq: Every morning | ORAL | 5 refills | Status: DC

## 2016-12-13 MED ORDER — MELOXICAM 15 MG PO TABS: 15.0000 mg | ORAL_TABLET | Freq: Every day | ORAL | 3 refills | Status: DC

## 2016-12-13 MED ORDER — ESTRADIOL 1 MG PO TABS: ORAL_TABLET | ORAL | 5 refills | Status: DC

## 2016-12-13 NOTE — Patient Instructions (Signed)
Health Maintenance for Postmenopausal Women Menopause is a normal process in which your reproductive ability comes to an end. This process happens gradually over a span of months to years, usually between the ages of 33 and 38. Menopause is complete when you have missed 12 consecutive menstrual periods. It is important to talk with your health care provider about some of the most common conditions that affect postmenopausal women, such as heart disease, cancer, and bone loss (osteoporosis). Adopting a healthy lifestyle and getting preventive care can help to promote your health and wellness. Those actions can also lower your chances of developing some of these common conditions. What should I know about menopause? During menopause, you may experience a number of symptoms, such as:  Moderate-to-severe hot flashes.  Night sweats.  Decrease in sex drive.  Mood swings.  Headaches.  Tiredness.  Irritability.  Memory problems.  Insomnia. Choosing to treat or not to treat menopausal changes is an individual decision that you make with your health care provider. What should I know about hormone replacement therapy and supplements? Hormone therapy products are effective for treating symptoms that are associated with menopause, such as hot flashes and night sweats. Hormone replacement carries certain risks, especially as you become older. If you are thinking about using estrogen or estrogen with progestin treatments, discuss the benefits and risks with your health care provider. What should I know about heart disease and stroke? Heart disease, heart attack, and stroke become more likely as you age. This may be due, in part, to the hormonal changes that your body experiences during menopause. These can affect how your body processes dietary fats, triglycerides, and cholesterol. Heart attack and stroke are both medical emergencies. There are many things that you can do to help prevent heart disease  and stroke:  Have your blood pressure checked at least every 1-2 years. High blood pressure causes heart disease and increases the risk of stroke.  If you are 48-61 years old, ask your health care provider if you should take aspirin to prevent a heart attack or a stroke.  Do not use any tobacco products, including cigarettes, chewing tobacco, or electronic cigarettes. If you need help quitting, ask your health care provider.  It is important to eat a healthy diet and maintain a healthy weight.  Be sure to include plenty of vegetables, fruits, low-fat dairy products, and lean protein.  Avoid eating foods that are high in solid fats, added sugars, or salt (sodium).  Get regular exercise. This is one of the most important things that you can do for your health.  Try to exercise for at least 150 minutes each week. The type of exercise that you do should increase your heart rate and make you sweat. This is known as moderate-intensity exercise.  Try to do strengthening exercises at least twice each week. Do these in addition to the moderate-intensity exercise.  Know your numbers.Ask your health care provider to check your cholesterol and your blood glucose. Continue to have your blood tested as directed by your health care provider. What should I know about cancer screening? There are several types of cancer. Take the following steps to reduce your risk and to catch any cancer development as early as possible. Breast Cancer  Practice breast self-awareness.  This means understanding how your breasts normally appear and feel.  It also means doing regular breast self-exams. Let your health care provider know about any changes, no matter how small.  If you are 40 or older,  have a clinician do a breast exam (clinical breast exam or CBE) every year. Depending on your age, family history, and medical history, it may be recommended that you also have a yearly breast X-ray (mammogram).  If you  have a family history of breast cancer, talk with your health care provider about genetic screening.  If you are at high risk for breast cancer, talk with your health care provider about having an MRI and a mammogram every year.  Breast cancer (BRCA) gene test is recommended for women who have family members with BRCA-related cancers. Results of the assessment will determine the need for genetic counseling and BRCA1 and for BRCA2 testing. BRCA-related cancers include these types:  Breast. This occurs in males or females.  Ovarian.  Tubal. This may also be called fallopian tube cancer.  Cancer of the abdominal or pelvic lining (peritoneal cancer).  Prostate.  Pancreatic. Cervical, Uterine, and Ovarian Cancer  Your health care provider may recommend that you be screened regularly for cancer of the pelvic organs. These include your ovaries, uterus, and vagina. This screening involves a pelvic exam, which includes checking for microscopic changes to the surface of your cervix (Pap test).  For women ages 21-65, health care providers may recommend a pelvic exam and a Pap test every three years. For women ages 23-65, they may recommend the Pap test and pelvic exam, combined with testing for human papilloma virus (HPV), every five years. Some types of HPV increase your risk of cervical cancer. Testing for HPV may also be done on women of any age who have unclear Pap test results.  Other health care providers may not recommend any screening for nonpregnant women who are considered low risk for pelvic cancer and have no symptoms. Ask your health care provider if a screening pelvic exam is right for you.  If you have had past treatment for cervical cancer or a condition that could lead to cancer, you need Pap tests and screening for cancer for at least 20 years after your treatment. If Pap tests have been discontinued for you, your risk factors (such as having a new sexual partner) need to be reassessed  to determine if you should start having screenings again. Some women have medical problems that increase the chance of getting cervical cancer. In these cases, your health care provider may recommend that you have screening and Pap tests more often.  If you have a family history of uterine cancer or ovarian cancer, talk with your health care provider about genetic screening.  If you have vaginal bleeding after reaching menopause, tell your health care provider.  There are currently no reliable tests available to screen for ovarian cancer. Lung Cancer  Lung cancer screening is recommended for adults 99-83 years old who are at high risk for lung cancer because of a history of smoking. A yearly low-dose CT scan of the lungs is recommended if you:  Currently smoke.  Have a history of at least 30 pack-years of smoking and you currently smoke or have quit within the past 15 years. A pack-year is smoking an average of one pack of cigarettes per day for one year. Yearly screening should:  Continue until it has been 15 years since you quit.  Stop if you develop a health problem that would prevent you from having lung cancer treatment. Colorectal Cancer  This type of cancer can be detected and can often be prevented.  Routine colorectal cancer screening usually begins at age 72 and continues  through age 75.  If you have risk factors for colon cancer, your health care provider may recommend that you be screened at an earlier age.  If you have a family history of colorectal cancer, talk with your health care provider about genetic screening.  Your health care provider may also recommend using home test kits to check for hidden blood in your stool.  A small camera at the end of a tube can be used to examine your colon directly (sigmoidoscopy or colonoscopy). This is done to check for the earliest forms of colorectal cancer.  Direct examination of the colon should be repeated every 5-10 years until  age 75. However, if early forms of precancerous polyps or small growths are found or if you have a family history or genetic risk for colorectal cancer, you may need to be screened more often. Skin Cancer  Check your skin from head to toe regularly.  Monitor any moles. Be sure to tell your health care provider:  About any new moles or changes in moles, especially if there is a change in a mole's shape or color.  If you have a mole that is larger than the size of a pencil eraser.  If any of your family members has a history of skin cancer, especially at a young age, talk with your health care provider about genetic screening.  Always use sunscreen. Apply sunscreen liberally and repeatedly throughout the day.  Whenever you are outside, protect yourself by wearing long sleeves, pants, a wide-brimmed hat, and sunglasses. What should I know about osteoporosis? Osteoporosis is a condition in which bone destruction happens more quickly than new bone creation. After menopause, you may be at an increased risk for osteoporosis. To help prevent osteoporosis or the bone fractures that can happen because of osteoporosis, the following is recommended:  If you are 19-50 years old, get at least 1,000 mg of calcium and at least 600 mg of vitamin D per day.  If you are older than age 50 but younger than age 70, get at least 1,200 mg of calcium and at least 600 mg of vitamin D per day.  If you are older than age 70, get at least 1,200 mg of calcium and at least 800 mg of vitamin D per day. Smoking and excessive alcohol intake increase the risk of osteoporosis. Eat foods that are rich in calcium and vitamin D, and do weight-bearing exercises several times each week as directed by your health care provider. What should I know about how menopause affects my mental health? Depression may occur at any age, but it is more common as you become older. Common symptoms of depression include:  Low or sad  mood.  Changes in sleep patterns.  Changes in appetite or eating patterns.  Feeling an overall lack of motivation or enjoyment of activities that you previously enjoyed.  Frequent crying spells. Talk with your health care provider if you think that you are experiencing depression. What should I know about immunizations? It is important that you get and maintain your immunizations. These include:  Tetanus, diphtheria, and pertussis (Tdap) booster vaccine.  Influenza every year before the flu season begins.  Pneumonia vaccine.  Shingles vaccine. Your health care provider may also recommend other immunizations. This information is not intended to replace advice given to you by your health care provider. Make sure you discuss any questions you have with your health care provider. Document Released: 10/11/2005 Document Revised: 03/08/2016 Document Reviewed: 05/23/2015 [employer] Interactive Patient   Education  2017 [employer]  

## 2016-12-13 NOTE — Progress Notes (Signed)
Patient: Sarah Todd, Female    DOB: November 28, 1945, 71 y.o.   MRN: [id number] Visit Date: 12/13/2016  Today's Provider: Mar Daring, PA-C   Chief Complaint  Patient presents with  . Medicare Wellness   Subjective:    Annual wellness visit Sarah Todd is a 71 y.o. female. She feels well. She reports exercising none, but she is very active.  She reports she is sleeping well. She reports that she got her Tdap vaccine at the [facility] hospital 10/2016 and she is getting her blood check more frequently.  Last AWE:12/13/15 Mammogram: 12/19/15 BI-RADS 1 BMD: 10/05/13 Normal Colonoscopy:01/05/15 Polyp Recheck in 5 years -----------------------------------------------------------  Review of Systems  Constitutional: Negative.   HENT: Positive for sinus pressure (some with pollen).   Eyes: Negative.   Respiratory: Negative.   Cardiovascular: Negative.   Gastrointestinal: Positive for constipation.  Endocrine: Positive for polyuria.  Genitourinary: Negative.   Musculoskeletal: Positive for arthralgias and back pain.  Skin: Negative.   Allergic/Immunologic: Positive for food allergies.  Neurological: Negative.   Hematological: Bruises/bleeds easily.  Psychiatric/Behavioral: Negative.     Social History   Social History  . Marital status: Married    Spouse name: Sarah Todd  . Number of children: 3  . Years of education: 15   Occupational History  . retired    Social History Main Topics  . Smoking status: Never Smoker  . Smokeless tobacco: Never Used  . Alcohol use No  . Drug use: No  . Sexual activity: Yes   Other Topics Concern  . Not on file   Social History Narrative  . No narrative on file    Past Medical History:  Diagnosis Date  . Allergy   . Arthritis    hands  . Hypertension   . Osteoporosis   . Varicose veins    Mild     Patient Active Problem List   Diagnosis Date Noted  . Candidiasis of breast 07/20/2015  .  Atrophic vaginitis 07/20/2015  . Constipation 06/20/2015  . Skin lesion of face 06/20/2015  . Tinea corporis 05/19/2015  . Allergic rhinitis 02/06/2015  . Arthritis 02/06/2015  . BP (high blood pressure) 02/06/2015  . OP (osteoporosis) 02/06/2015  . Shoulder strain 02/06/2015  . Dermatophytosis of groin 02/06/2015  . Candida vaginitis 02/06/2015    Past Surgical History:  Procedure Laterality Date  . ABDOMINAL HYSTERECTOMY    . BACK SURGERY    . COLONOSCOPY N/A 01/05/2015   Procedure: COLONOSCOPY;  Surgeon: Lucilla Lame, MD;  Location: [facility];  Service: Gastroenterology;  Laterality: N/A;  . DILATION AND CURETTAGE OF UTERUS    . TUBAL LIGATION      Her family history includes Alzheimer's disease in her father; Cancer in her mother and sister; Heart disease in her father; Hypertension in her father.      Current Outpatient Prescriptions:  .  acetaminophen (TYLENOL) 650 MG CR tablet, Take 650 mg by mouth every 4 (four) hours as needed for pain., Disp: , Rfl:  .  aspirin 81 MG tablet, Take 81 mg by mouth daily. 1/2 tab po qd, Disp: , Rfl:  .  Calcium Citrate 200 MG TABS, Take 1 tablet by mouth daily. , Disp: , Rfl:  .  Cholecalciferol (VITAMIN D-3 PO), Take 1,000 Units by mouth daily. AM, Disp: , Rfl:  .  estradiol (ESTRACE) 1 MG tablet, TAKE ONE (1) TABLET BY MOUTH EVERY DAY, Disp: 30 tablet, Rfl: 5 .  gabapentin (NEURONTIN) 600 MG tablet, Take 600 mg by mouth 2 (two) times daily. , Disp: , Rfl:  .  losartan (COZAAR) 50 MG tablet, Take 1 tablet (50 mg total) by mouth every morning., Disp: 90 tablet, Rfl: 1 .  Multiple Vitamin (MULTIVITAMIN) capsule, Take 1 capsule by mouth daily. AM, Disp: , Rfl:  .  Omega 3 1000 MG CAPS, Take 1,000 mg by mouth daily. AM, Disp: , Rfl:  .  vitamin C (ASCORBIC ACID) 500 MG tablet, Take by mouth., Disp: , Rfl:  .  meloxicam (MOBIC) 15 MG tablet, Take 1 tablet (15 mg total) by mouth daily. (Patient not taking: Reported on 12/13/2016), Disp: 30  tablet, Rfl: 1  Patient Care Team: Mar Daring, PA-C as PCP - General (Family Medicine)     Objective:   Vitals: BP 120/80 (BP Location: Right Arm, Patient Position: Sitting, Cuff Size: Normal)   Pulse 92   Temp 98.1 F (36.7 C) (Oral)   Resp 16   Ht 5\' 2"  (1.575 m)   Wt 134 lb 6.4 oz (61 kg)   BMI 24.58 kg/m   Physical Exam  Constitutional: She is oriented to person, place, and time. She appears well-developed and well-nourished. No distress.  HENT:  Head: Normocephalic and atraumatic.  Right Ear: Hearing, tympanic membrane, external ear and ear canal normal.  Left Ear: Hearing, tympanic membrane, external ear and ear canal normal.  Nose: Nose normal.  Mouth/Throat: Uvula is midline, oropharynx is clear and moist and mucous membranes are normal. No oropharyngeal exudate.  Eyes: Conjunctivae and EOM are normal. Pupils are equal, round, and reactive to light. Right eye exhibits no discharge. Left eye exhibits no discharge. No scleral icterus.  Neck: Normal range of motion. Neck supple. No JVD present. Carotid bruit is not present. No tracheal deviation present. No thyromegaly present.  Cardiovascular: Normal rate, regular rhythm, normal heart sounds and intact distal pulses.  Exam reveals no gallop and no friction rub.   No murmur heard. Pulmonary/Chest: Effort normal and breath sounds normal. No respiratory distress. She has no wheezes. She has no rales. She exhibits no tenderness. Right breast exhibits no inverted nipple, no mass, no nipple discharge, no skin change and no tenderness. Left breast exhibits no inverted nipple, no mass, no nipple discharge, no skin change and no tenderness. Breasts are symmetrical.  Abdominal: Soft. Bowel sounds are normal. She exhibits no distension and no mass. There is no tenderness. There is no rebound and no guarding.  Musculoskeletal: Normal range of motion. She exhibits no edema or tenderness.  Lymphadenopathy:    She has no cervical  adenopathy.  Neurological: She is alert and oriented to person, place, and time.  Skin: Skin is warm and dry. No rash noted. She is not diaphoretic.  Psychiatric: She has a normal mood and affect. Her behavior is normal. Judgment and thought content normal.  Vitals reviewed.   Activities of Daily Living In your present state of health, do you have any difficulty performing the following activities: 12/13/2016  Hearing? N  Vision? Y  Difficulty concentrating or making decisions? Y  Walking or climbing stairs? Y  Dressing or bathing? N  Doing errands, shopping? N  Some recent data might be hidden    Fall Risk Assessment Fall Risk  12/13/2016 12/13/2015 02/06/2015  Falls in the past year? No No No     Depression Screen PHQ 2/9 Scores 12/13/2016 12/13/2015  PHQ - 2 Score 0 0  PHQ- 9 Score 2 -  Cognitive Testing - 6-CIT  Correct? Score   What year is it? yes 0 0 or 4  What month is it? yes 0 0 or 3  Memorize:    Pia Mau,  40,  [place],      What time is it? (within 1 hour) yes 0 0 or 3  Count backwards from 20 yes 0 0, 2, or 4  Name the months of the year yes 0 0, 2, or 4  Repeat name & address above no 6 0, 2, 4, 6, 8, or 10       TOTAL SCORE  06/28   Interpretation:  Normal  Normal (0-7) Abnormal (8-28)   Audit-C Alcohol Use Screening  Question Answer Points  How often do you have alcoholic drink? never 0  On days you do drink alcohol, how many drinks do you typically consume? 0 0  How oftey will you drink 6 or more in a total? never 0  Total Score:  0   A score of 3 or more in women, and 4 or more in men indicates increased risk for alcohol abuse, EXCEPT if all of the points are from question 1.     Assessment & Plan:     Annual Wellness Visit  Reviewed patient's Family Medical History Reviewed and updated list of patient's medical providers Assessment of cognitive impairment was done Assessed patient's functional ability Established a written  schedule for health screening [place] Completed and Reviewed  Exercise Activities and Dietary recommendations Goals    None      Immunization History  Administered Date(s) Administered  . Influenza,inj,Quad PF,36+ Mos 06/15/2015  . Pneumococcal Conjugate-13 11/17/2014  . Pneumococcal Polysaccharide-23 08/03/2013    Health Maintenance  Topic Date Due  . Hepatitis C Screening  1945-11-19  . TETANUS/TDAP  12/29/1962  . INFLUENZA VACCINE  05/03/2017 (Originally 04/02/2017)  . MAMMOGRAM  12/18/2017  . COLONOSCOPY  01/04/2025  . DEXA SCAN  Completed  . PNA vac Low Risk Adult  Completed     Discussed health benefits of physical activity, and encouraged her to engage in regular exercise appropriate for her age and condition.    1. Medicare annual wellness visit, subsequent Normal physical exam for age. All immunizations are up to date.   2. Breast cancer screening Breast exam today was normal. There is no family history of breast cancer. She does perform regular self breast exams. Mammogram was ordered as below. Information for [facility] Breast clinic was given to patient so she may schedule her mammogram at her convenience. - MM Digital Screening; Future  3. Essential hypertension Stable. Diagnosis pulled for medication refill. Continue current medical treatment plan. Will check labs as below and f/u pending results. - CBC w/Diff/Platelet - Comprehensive Metabolic Panel (CMET) - TSH - Lipid Profile - HgB A1c - losartan (COZAAR) 50 MG tablet; Take 1 tablet (50 mg total) by mouth every morning.  Dispense: 30 tablet; Refill: 5  4. Encounter for lipid screening for cardiovascular disease - Lipid Profile  5. Post-menopausal Stable. Diagnosis pulled for medication refill. Continue current medical treatment plan. - estradiol (ESTRACE) 1 MG tablet; TAKE ONE (1) TABLET BY MOUTH EVERY DAY  Dispense: 30 tablet; Refill: 5  6. Right lateral epicondylitis Stable.  Followed by Dr. Mack Guise. Diagnosis pulled for medication refill. Continue current medical treatment plan. - meloxicam (MOBIC) 15 MG tablet; Take 1 tablet (15 mg total) by mouth daily.  Dispense: 30 tablet; Refill: 3  ------------------------------------------------------------------------------------------------------------  Mar Daring, PA-C  [facility]

## 2016-12-17 DIAGNOSIS — M25521 Pain in right elbow: Secondary | ICD-10-CM | POA: Diagnosis not present

## 2016-12-17 DIAGNOSIS — M7711 Lateral epicondylitis, right elbow: Secondary | ICD-10-CM | POA: Diagnosis not present

## 2016-12-27 DIAGNOSIS — M25521 Pain in right elbow: Secondary | ICD-10-CM | POA: Diagnosis not present

## 2016-12-27 DIAGNOSIS — M7711 Lateral epicondylitis, right elbow: Secondary | ICD-10-CM | POA: Diagnosis not present

## 2016-12-31 DIAGNOSIS — M25521 Pain in right elbow: Secondary | ICD-10-CM | POA: Diagnosis not present

## 2016-12-31 DIAGNOSIS — M7711 Lateral epicondylitis, right elbow: Secondary | ICD-10-CM | POA: Diagnosis not present

## 2017-01-07 DIAGNOSIS — M7711 Lateral epicondylitis, right elbow: Secondary | ICD-10-CM | POA: Diagnosis not present

## 2017-01-07 DIAGNOSIS — M25521 Pain in right elbow: Secondary | ICD-10-CM | POA: Diagnosis not present

## 2017-01-09 DIAGNOSIS — M25521 Pain in right elbow: Secondary | ICD-10-CM | POA: Diagnosis not present

## 2017-01-09 DIAGNOSIS — M7711 Lateral epicondylitis, right elbow: Secondary | ICD-10-CM | POA: Diagnosis not present

## 2017-01-10 ENCOUNTER — Ambulatory Visit
Admission: RE | Admit: 2017-01-10 | Discharge: 2017-01-10 | Disposition: A | Discharge status: Home / Self Care | Payer: PPO | Source: Ambulatory Visit | Attending: Physician Assistant | Admitting: Physician Assistant

## 2017-01-10 ENCOUNTER — Telehealth: Payer: Self-pay

## 2017-01-10 DIAGNOSIS — Z1239 Encounter for other screening for malignant neoplasm of breast: Secondary | ICD-10-CM

## 2017-01-10 DIAGNOSIS — Z1231 Encounter for screening mammogram for malignant neoplasm of breast: Secondary | ICD-10-CM | POA: Insufficient documentation

## 2017-01-10 IMAGING — MG MM DIGITAL SCREENING BILAT W/ TOMO W/ CAD
9 of 12 series · 9 of 28 positions shown · non-contrast
Comparison: Previous exam(s).

CLINICAL DATA: Screening.

EXAM:
2D DIGITAL SCREENING BILATERAL MAMMOGRAM WITH CAD AND ADJUNCT TOMO

[L CC synth-2D]
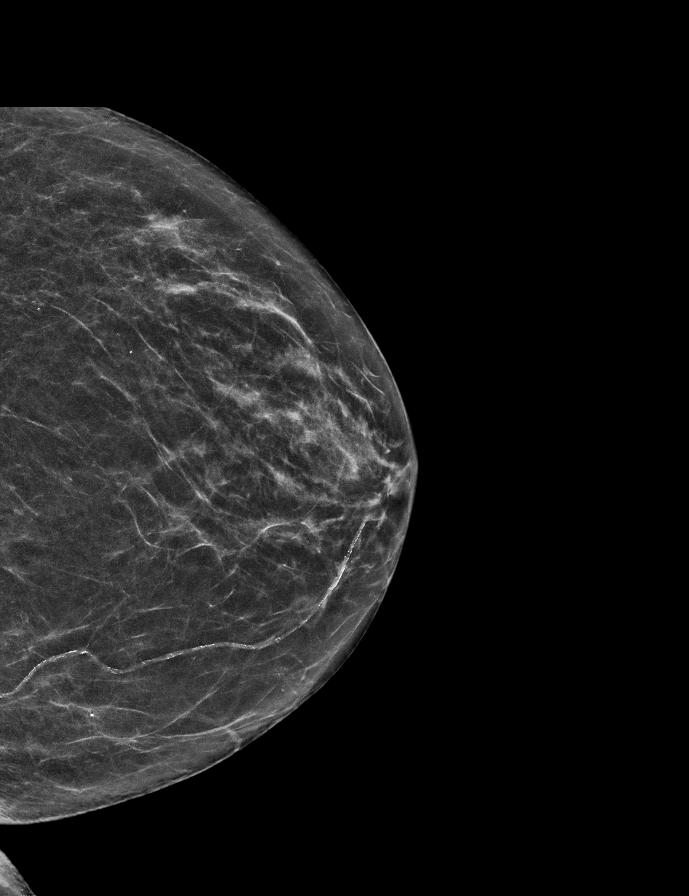

[L CC]
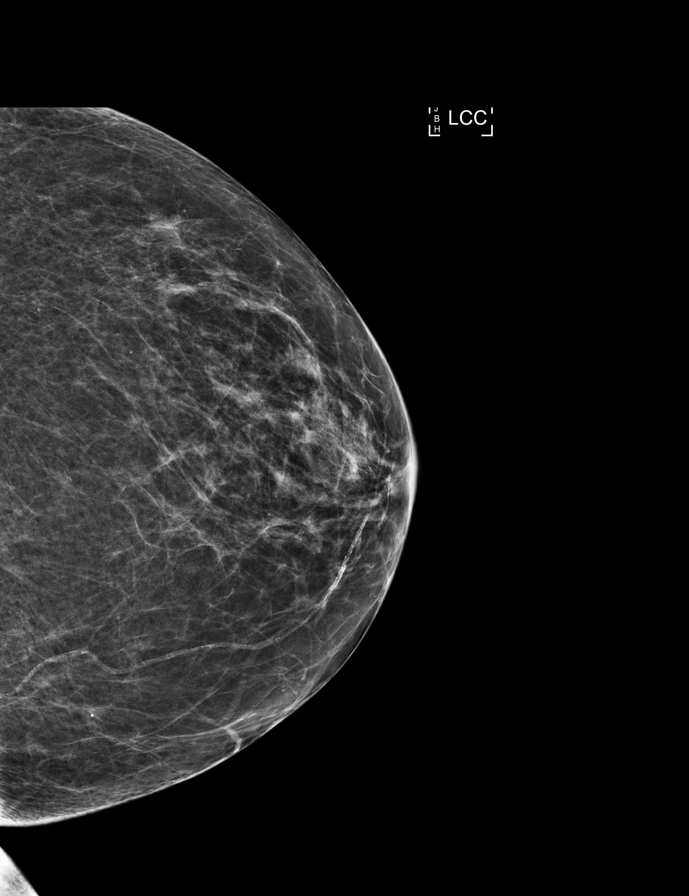

[R CC synth-2D]
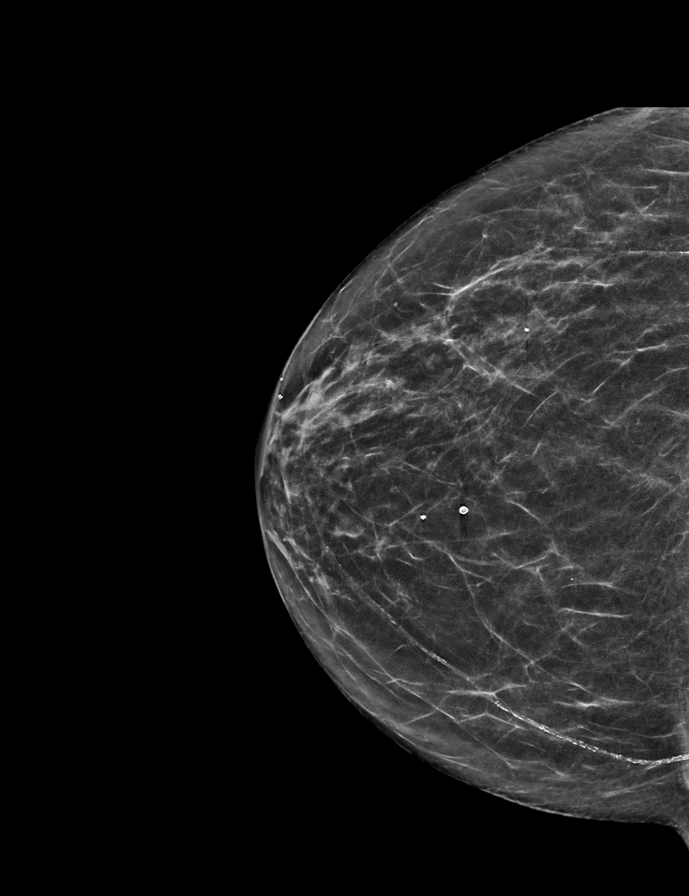

[R MLO synth-2D]
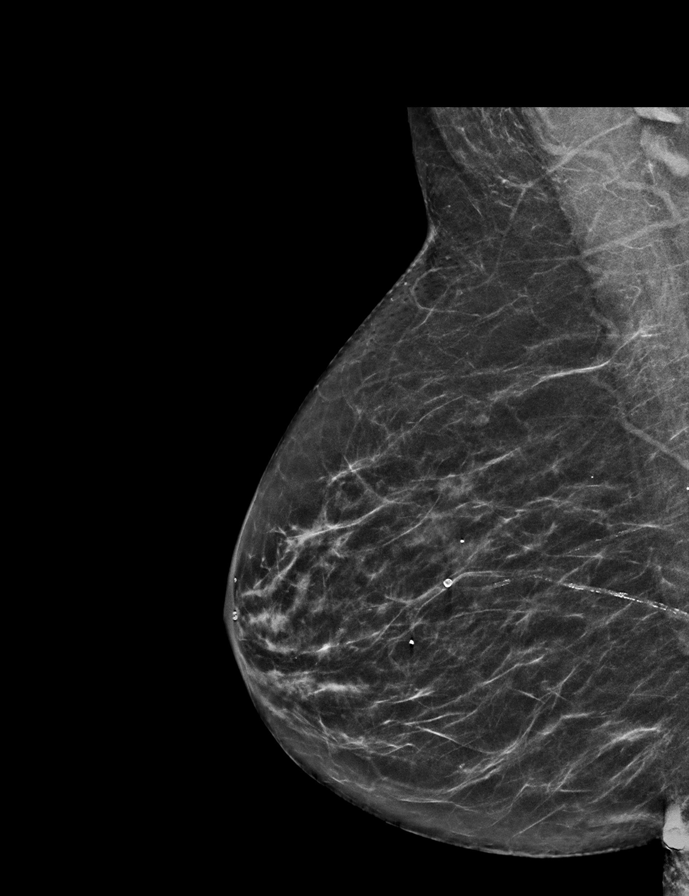

[R CC]
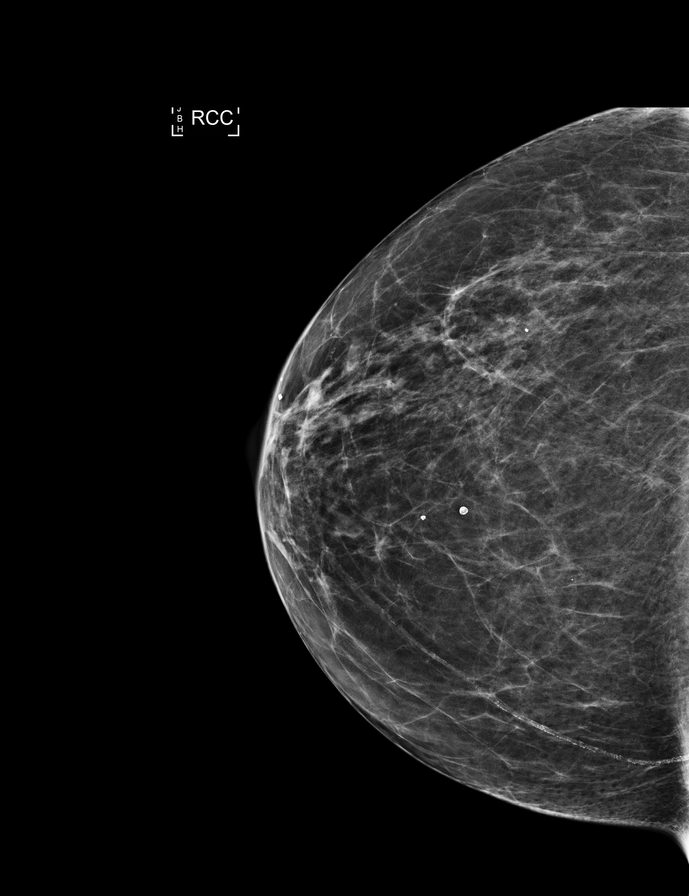

[L MLO synth-2D]
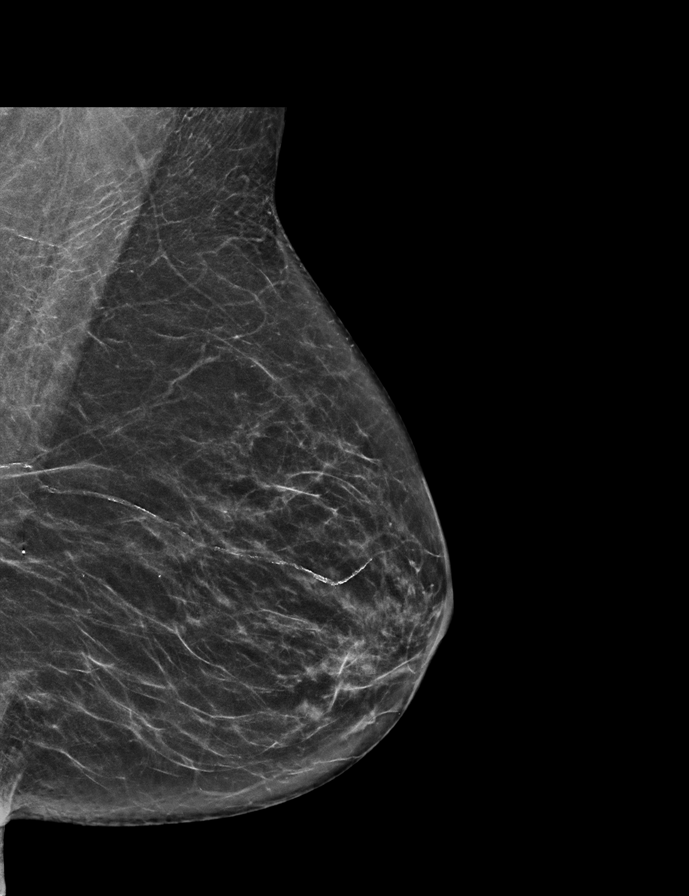

[R MLO]
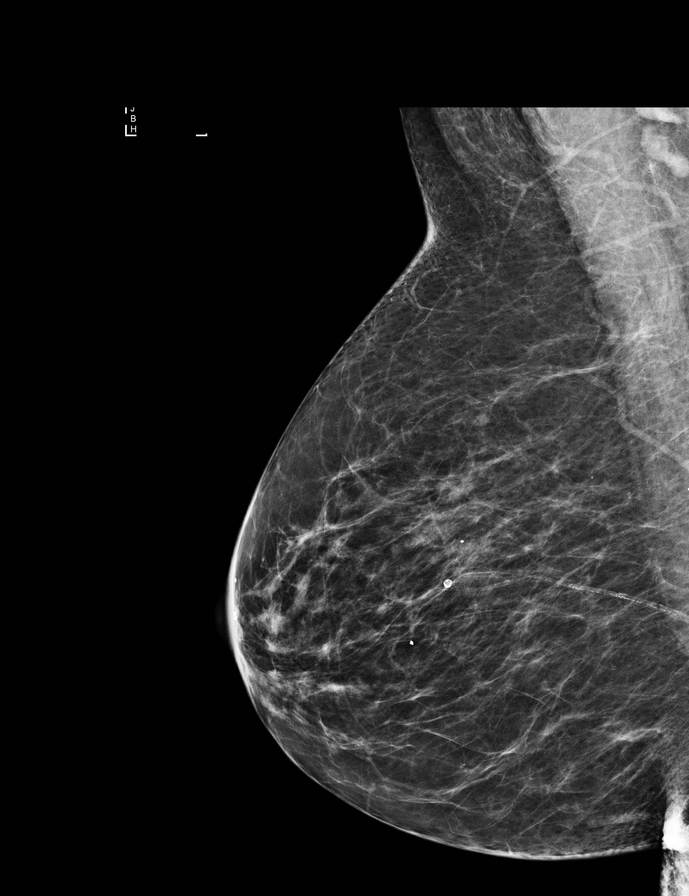

[L MLO]
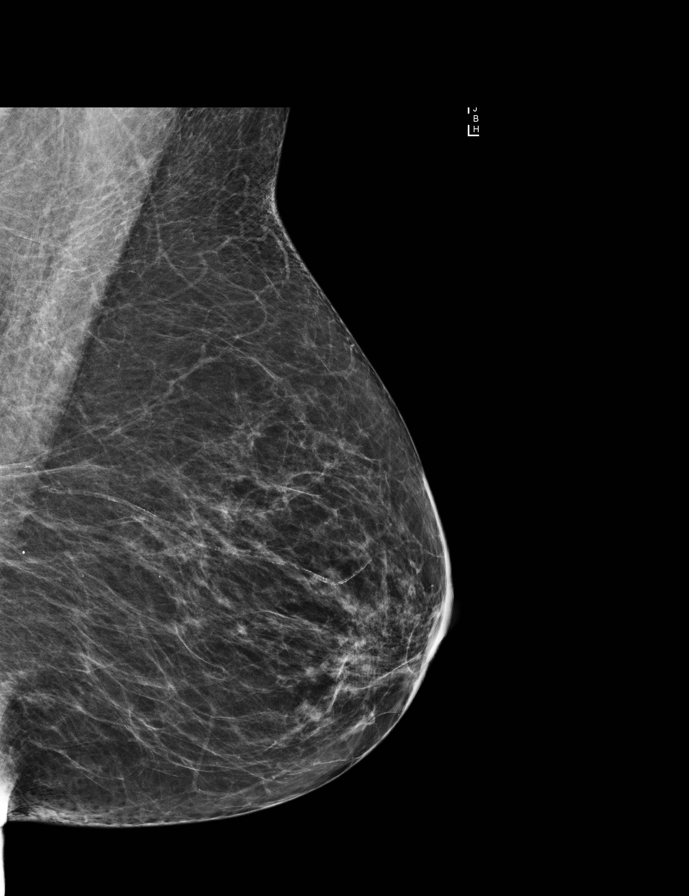

[R CC tomo · tomo slice 26/51.0]
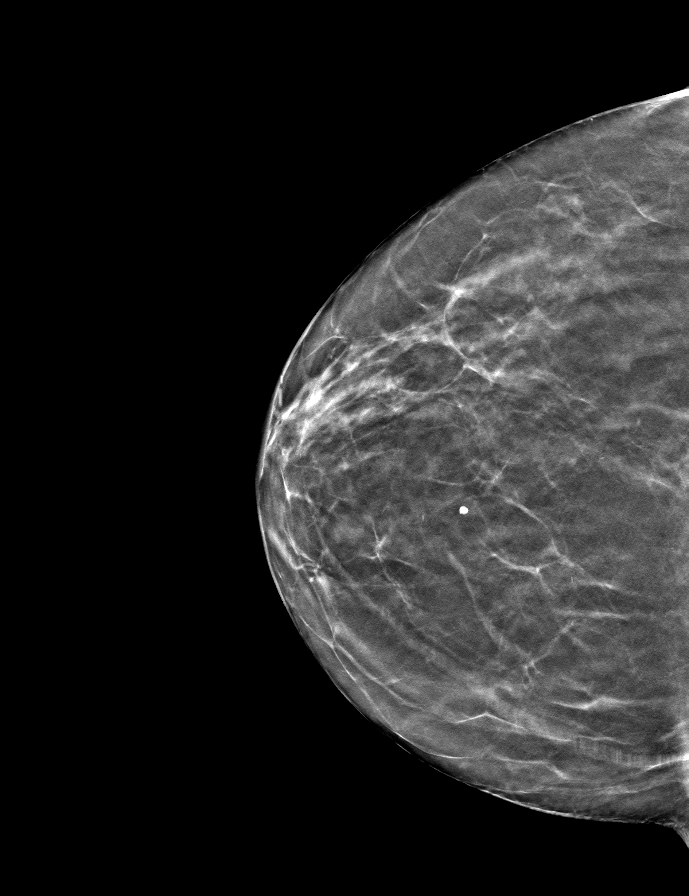

[9 of 28 positions shown; findings below may reference images not displayed]

ACR Breast Density Category b: There are scattered areas of
fibroglandular density.
FINDINGS: There are no findings suspicious for malignancy. Images were
processed with CAD.
IMPRESSION: No mammographic evidence of malignancy. A result letter of this
screening mammogram will be mailed directly to the patient.

RECOMMENDATION:
Screening mammogram in one year. (Code:[33])

BI-RADS CATEGORY  1: Negative.

## 2017-01-10 NOTE — Telephone Encounter (Signed)
lmtcb Emily Drozdowski, CMA  

## 2017-01-10 NOTE — Telephone Encounter (Signed)
-----   Message from Mar Daring, [place] sent at 01/10/2017  4:27 PM EDT ----- Normal mammogram. Repeat screening in one year.

## 2017-01-13 DIAGNOSIS — M25521 Pain in right elbow: Secondary | ICD-10-CM | POA: Diagnosis not present

## 2017-01-13 DIAGNOSIS — M7711 Lateral epicondylitis, right elbow: Secondary | ICD-10-CM | POA: Diagnosis not present

## 2017-01-13 NOTE — Telephone Encounter (Signed)
She would need an appt but she can try OTC omeprazole (prilosec) prior to see if that helps. It is probably from her having to take anti-inflammatories for her elbow

## 2017-01-13 NOTE — Telephone Encounter (Signed)
Patient advised as directed below. She will get her labs done this week.  She reports that she is having indigestion. She got Miralax to see if she was constipated. She reports went to the bathroom three times today but normal stool. She reports she get the pain in the epigastric region.sometimes with eating and feels better sometimes after she eats. She reports using equate anti acid otc medication and has tried Zantac but still feels the same.she wants to know what provider advises or if she needs an appointment.  Thanks,  -Ellarae Nevitt

## 2017-01-13 NOTE — Telephone Encounter (Signed)
LMTCB  Thanks,  -Nickholas Goldston 

## 2017-01-13 NOTE — Telephone Encounter (Signed)
Patient reports she will take omeprazole and call if symptoms are not improving or worsening for an appointment. sd

## 2017-01-14 DIAGNOSIS — Z136 Encounter for screening for cardiovascular disorders: Secondary | ICD-10-CM | POA: Diagnosis not present

## 2017-01-14 DIAGNOSIS — I10 Essential (primary) hypertension: Secondary | ICD-10-CM | POA: Diagnosis not present

## 2017-01-14 DIAGNOSIS — Z13220 Encounter for screening for lipoid disorders: Secondary | ICD-10-CM | POA: Diagnosis not present

## 2017-01-15 ENCOUNTER — Telehealth: Payer: Self-pay

## 2017-01-15 ENCOUNTER — Ambulatory Visit (INDEPENDENT_AMBULATORY_CARE_PROVIDER_SITE_OTHER): Payer: PPO | Admitting: Physician Assistant

## 2017-01-15 ENCOUNTER — Encounter: Payer: Self-pay | Admitting: Physician Assistant

## 2017-01-15 VITALS — BP 100/66 | HR 76 | Temp 97.6°F | Resp 16 | Wt 132.2 lb

## 2017-01-15 DIAGNOSIS — K2960 Other gastritis without bleeding: Secondary | ICD-10-CM

## 2017-01-15 DIAGNOSIS — T39395A Adverse effect of other nonsteroidal anti-inflammatory drugs [NSAID], initial encounter: Secondary | ICD-10-CM

## 2017-01-15 LAB — CBC WITH DIFFERENTIAL/PLATELET
BASOS: 0 %
Basophils Absolute: 0 10*3/uL (ref 0.0–0.2)
EOS (ABSOLUTE): 0.2 10*3/uL (ref 0.0–0.4)
EOS: 3 %
HEMATOCRIT: 37.3 % (ref 34.0–46.6)
Hemoglobin: 12.2 g/dL (ref 11.1–15.9)
IMMATURE GRANULOCYTES: 0 %
Immature Grans (Abs): 0 10*3/uL (ref 0.0–0.1)
LYMPHS ABS: 2.9 10*3/uL (ref 0.7–3.1)
Lymphs: 51 %
MCH: 30.2 pg (ref 26.6–33.0)
MCHC: 32.7 g/dL (ref 31.5–35.7)
MCV: 92 fL (ref 79–97)
MONOS ABS: 0.3 10*3/uL (ref 0.1–0.9)
Monocytes: 6 %
NEUTROS ABS: 2.2 10*3/uL (ref 1.4–7.0)
Neutrophils: 40 %
Platelets: 364 10*3/uL (ref 150–379)
RBC: 4.04 x10E6/uL (ref 3.77–5.28)
RDW: 12.8 % (ref 12.3–15.4)
WBC: 5.6 10*3/uL (ref 3.4–10.8)

## 2017-01-15 LAB — COMPREHENSIVE METABOLIC PANEL
A/G RATIO: 1.5 (ref 1.2–2.2)
ALT: 12 IU/L (ref 0–32)
AST: 17 IU/L (ref 0–40)
Albumin: 4.3 g/dL (ref 3.5–4.8)
Alkaline Phosphatase: 56 IU/L (ref 39–117)
BUN/Creatinine Ratio: 16 (ref 12–28)
BUN: 11 mg/dL (ref 8–27)
Bilirubin Total: 0.2 mg/dL (ref 0.0–1.2)
CALCIUM: 9.5 mg/dL (ref 8.7–10.3)
CO2: 23 mmol/L (ref 18–29)
Chloride: 100 mmol/L (ref 96–106)
Creatinine, Ser: 0.67 mg/dL (ref 0.57–1.00)
GFR calc Af Amer: 101 mL/min/{1.73_m2} (ref >59)
GFR, EST NON AFRICAN AMERICAN: 87 mL/min/{1.73_m2} (ref >59)
GLOBULIN, TOTAL: 2.9 g/dL (ref 1.5–4.5)
Glucose: 95 mg/dL (ref 65–99)
POTASSIUM: 4.4 mmol/L (ref 3.5–5.2)
SODIUM: 139 mmol/L (ref 134–144)
Total Protein: 7.2 g/dL (ref 6.0–8.5)

## 2017-01-15 LAB — HEMOGLOBIN A1C
ESTIMATED AVERAGE GLUCOSE: 114 mg/dL
HEMOGLOBIN A1C: 5.6 % (ref 4.8–5.6)

## 2017-01-15 LAB — LIPID PANEL
Chol/HDL Ratio: 2.6 ratio (ref 0.0–4.4)
Cholesterol, Total: 176 mg/dL (ref 100–199)
HDL: 69 mg/dL (ref >39)
LDL CALC: 79 mg/dL (ref 0–99)
Triglycerides: 138 mg/dL (ref 0–149)
VLDL CHOLESTEROL CAL: 28 mg/dL (ref 5–40)

## 2017-01-15 LAB — TSH: TSH: 4.18 u[IU]/mL (ref 0.450–4.500)

## 2017-01-15 MED ORDER — PANTOPRAZOLE SODIUM 40 MG PO TBEC: 40.0000 mg | DELAYED_RELEASE_TABLET | Freq: Every day | ORAL | 3 refills | Status: DC

## 2017-01-15 NOTE — Telephone Encounter (Signed)
-----   Message from Mar Daring, PA-C sent at 01/15/2017  8:13 AM EDT ----- All labs are within normal limits and stable.  Thanks! -JB

## 2017-01-15 NOTE — Progress Notes (Signed)
Patient: Sarah Todd Female    DOB: 1946-08-25   71 y.o.   MRN: [id number] Visit Date: 01/15/2017  Today's Provider: Mar Daring, PA-C   Chief Complaint  Patient presents with  . Gastroesophageal Reflux   Subjective:    HPI GERD: Paitent complains of heartburn. This has been associated with abdominal bloating, belching and heartburn.  She denies chest pain, choking on food and cough. Symptoms have been present for several weeks. She denies dysphagia.  She has not lost weight. She denies melena, hematochezia, hematemesis, and coffee ground emesis. Denies bowel changes, bloody stools, melena or hematochezia. Medical therapy in the past has included antacids, pt reports she is taking Ranitidine 75 mg 1 tablet PRN. Gets better with food. She has been on long term NSAIDs and steroids for right lateral epicondylitis. She has been on them off and on since 08/2016.     Allergies  Allergen Reactions  . Lisinopril Anaphylaxis  . Shrimp [Shellfish Allergy] Anaphylaxis  . Aspercreme [Trolamine Salicylate] Rash     Current Outpatient Prescriptions:  .  acetaminophen (TYLENOL) 650 MG CR tablet, Take 650 mg by mouth every 4 (four) hours as needed for pain., Disp: , Rfl:  .  aspirin 81 MG tablet, Take 81 mg by mouth daily. 1/2 tab po qd, Disp: , Rfl:  .  Calcium Citrate 200 MG TABS, Take 1 tablet by mouth daily. , Disp: , Rfl:  .  Cholecalciferol (VITAMIN D-3 PO), Take 1,000 Units by mouth daily. AM, Disp: , Rfl:  .  estradiol (ESTRACE) 1 MG tablet, TAKE ONE (1) TABLET BY MOUTH EVERY DAY, Disp: 30 tablet, Rfl: 5 .  gabapentin (NEURONTIN) 600 MG tablet, Take 600 mg by mouth 2 (two) times daily. , Disp: , Rfl:  .  losartan (COZAAR) 50 MG tablet, Take 1 tablet (50 mg total) by mouth every morning., Disp: 30 tablet, Rfl: 5 .  Multiple Vitamin (MULTIVITAMIN) capsule, Take 1 capsule by mouth daily. AM, Disp: , Rfl:  .  Omega 3 1000 MG CAPS, Take 1,000 mg by mouth daily. AM, Disp:  , Rfl:  .  vitamin C (ASCORBIC ACID) 500 MG tablet, Take by mouth., Disp: , Rfl:  .  meloxicam (MOBIC) 15 MG tablet, Take 1 tablet (15 mg total) by mouth daily. (Patient not taking: Reported on 01/15/2017), Disp: 30 tablet, Rfl: 3  Review of Systems  Constitutional: Negative.   Respiratory: Negative.   Cardiovascular: Negative.   Gastrointestinal: Positive for abdominal distention and abdominal pain. Negative for anal bleeding, blood in stool, constipation, diarrhea, nausea, rectal pain and vomiting.  Neurological: Negative.     Social History  Substance Use Topics  . Smoking status: Never Smoker  . Smokeless tobacco: Never Used  . Alcohol use No   Objective:   BP 100/66 (BP Location: Left Arm, Patient Position: Sitting, Cuff Size: Normal)   Pulse 76   Temp 97.6 F (36.4 C) (Oral)   Resp 16   Wt 132 lb 3.2 oz (60 kg)   SpO2 99%   BMI 24.18 kg/m  Vitals:   01/15/17 1329  BP: 100/66  Pulse: 76  Resp: 16  Temp: 97.6 F (36.4 C)  TempSrc: Oral  SpO2: 99%  Weight: 132 lb 3.2 oz (60 kg)     Physical Exam  Constitutional: She is oriented to person, place, and time. She appears well-developed and well-nourished. No distress.  Cardiovascular: Normal rate, regular rhythm and normal heart sounds.  Exam  reveals no gallop and no friction rub.   No murmur heard. Pulmonary/Chest: Effort normal and breath sounds normal. No respiratory distress. She has no wheezes. She has no rales.  Abdominal: Soft. Normal appearance and bowel sounds are normal. She exhibits no distension and no mass. There is no hepatosplenomegaly. There is generalized tenderness (generalized tenderness but most tender in epigastrum). There is no rebound, no guarding and no CVA tenderness.  Neurological: She is alert and oriented to person, place, and time.  Skin: Skin is warm and dry. She is not diaphoretic.  Vitals reviewed.      Assessment & Plan:     1. NSAID induced gastritis Highly suspicious of NSAID  induced gastritis due to recently taking these medications for epicondylitis over the last 5-6 months. She is no longer taking and using Tylenol. Advised her to not take any NSAIDs at this time. Protonix given as below. She is to call if symptoms worsen and was given strict return precautions.  - pantoprazole (PROTONIX) 40 MG tablet; Take 1 tablet (40 mg total) by mouth daily.  Dispense: 30 tablet; Refill: [place], PA-C  [facility]

## 2017-01-15 NOTE — Telephone Encounter (Signed)
Patient advised as below.  

## 2017-01-15 NOTE — Patient Instructions (Signed)
Gastritis, Adult  Gastritis is inflammation of the stomach. There are two kinds of gastritis:  · Acute gastritis. This kind develops suddenly.  · Chronic gastritis. This kind lasts for a long time.    Gastritis happens when the lining of the stomach becomes weak or gets damaged. Without treatment, gastritis can lead to stomach bleeding and ulcers.  What are the causes?  This condition may be caused by:  · An infection.  · Drinking too much alcohol.  · Certain medicines.  · Having too much acid in the stomach.  · A disease of the intestines or stomach.  · Stress.    What are the signs or symptoms?  Symptoms of this condition include:  · Pain or a burning in the upper abdomen.  · Nausea.  · Vomiting.  · An uncomfortable feeling of fullness after eating.    In some cases, there are no symptoms.  How is this diagnosed?  This condition may be diagnosed with:  · A description of your symptoms.  · A physical exam.  · Tests. These can include:  ? Blood tests.  ? Stool tests.  ? A test in which a thin, flexible instrument with a light and camera on the end is passed down the esophagus and into the stomach (upper endoscopy).  ? A test in which a sample of tissue is taken for testing (biopsy).    How is this treated?  This condition may be treated with medicines. If the condition is caused by a bacterial infection, you may be given antibiotic medicines. If it is caused by too much acid in the stomach, you may get medicines called H2 blockers, proton pump inhibitors, or antacids. Treatment may also involve stopping the use of certain medicines, such as aspirin, ibuprofen, or other nonsteroidal anti-inflammatory drugs (NSAIDs).  Follow these instructions at home:  · Take over-the-counter and prescription medicines only as told by your health care provider.  · If you were prescribed an antibiotic, take it as told by your health care provider. Do not stop taking the antibiotic even if you start to feel better.  · Drink enough  fluid to keep your urine clear or pale yellow.  · Eat small, frequent meals instead of large meals.  Contact a health care provider if:  · Your symptoms get worse.  · Your symptoms return after treatment.  Get help right away if:  · You vomit blood or material that looks like coffee grounds.  · You have black or dark red stools.  · You are unable to keep fluids down.  · Your abdominal pain gets worse.  · You have a fever.  · You do not feel better after 1 week.  This information is not intended to replace advice given to you by your health care provider. Make sure you discuss any questions you have with your health care provider.  Document Released: 08/13/2001 Document Revised: 04/17/2016 Document Reviewed: 05/13/2015  Elsevier Interactive Patient Education © 2017 Elsevier Inc.

## 2017-02-05 ENCOUNTER — Ambulatory Visit (INDEPENDENT_AMBULATORY_CARE_PROVIDER_SITE_OTHER): Payer: PPO | Admitting: Physician Assistant

## 2017-02-05 ENCOUNTER — Encounter: Payer: Self-pay | Admitting: Physician Assistant

## 2017-02-05 VITALS — BP 110/66 | HR 94 | Temp 98.2°F | Resp 16 | Wt 134.0 lb

## 2017-02-05 DIAGNOSIS — J0100 Acute maxillary sinusitis, unspecified: Secondary | ICD-10-CM | POA: Diagnosis not present

## 2017-02-05 MED ORDER — AMOXICILLIN-POT CLAVULANATE 875-125 MG PO TABS: 1.0000 | ORAL_TABLET | Freq: Two times a day (BID) | ORAL | 0 refills | Status: DC

## 2017-02-05 NOTE — Patient Instructions (Signed)

## 2017-02-05 NOTE — Progress Notes (Signed)
Patient: Sarah Todd Female    DOB: [birth year]   71 y.o.   MRN: [id number] Visit Date: 02/05/2017  Today's Provider: Mar Daring, PA-C   Chief Complaint  Patient presents with  . URI   Subjective:    URI   This is a new problem. The current episode started 1 to 4 weeks ago (URI symptoms for 16 days. She reports that she went to a [place] trip to [place] and was feeling well and on that week she started with allergy symptoms.). The problem has been gradually worsening. There has been no fever. Associated symptoms include congestion, coughing, headaches and sinus pain. Pertinent negatives include no abdominal pain, chest pain, ear pain, sore throat or wheezing. She has tried antihistamine and decongestant (three differet types of Mucinex and Benadryl, home remedy.) for the symptoms. The treatment provided no relief.      Allergies  Allergen Reactions  . Lisinopril Anaphylaxis  . Shrimp [Shellfish Allergy] Anaphylaxis  . Aspercreme [Trolamine Salicylate] Rash     Current Outpatient Prescriptions:  .  acetaminophen (TYLENOL) 650 MG CR tablet, Take 650 mg by mouth every 4 (four) hours as needed for pain., Disp: , Rfl:  .  aspirin 81 MG tablet, Take 81 mg by mouth daily. 1/2 tab po qd, Disp: , Rfl:  .  Calcium Citrate 200 MG TABS, Take 1 tablet by mouth daily. , Disp: , Rfl:  .  Cholecalciferol (VITAMIN D-3 PO), Take 1,000 Units by mouth daily. AM, Disp: , Rfl:  .  estradiol (ESTRACE) 1 MG tablet, TAKE ONE (1) TABLET BY MOUTH EVERY DAY, Disp: 30 tablet, Rfl: 5 .  gabapentin (NEURONTIN) 600 MG tablet, Take 600 mg by mouth 2 (two) times daily. , Disp: , Rfl:  .  losartan (COZAAR) 50 MG tablet, Take 1 tablet (50 mg total) by mouth every morning., Disp: 30 tablet, Rfl: 5 .  Multiple Vitamin (MULTIVITAMIN) capsule, Take 1 capsule by mouth daily. AM, Disp: , Rfl:  .  Omega 3 1000 MG CAPS, Take 1,000 mg by mouth daily. AM, Disp: , Rfl:  .  pantoprazole (PROTONIX) 40 MG  tablet, Take 1 tablet (40 mg total) by mouth daily., Disp: 30 tablet, Rfl: 3 .  vitamin C (ASCORBIC ACID) 500 MG tablet, Take by mouth., Disp: , Rfl:   Review of Systems  Constitutional: Positive for fatigue. Negative for chills.  HENT: Positive for congestion, postnasal drip, sinus pain and sinus pressure. Negative for ear pain and sore throat.   Respiratory: Positive for cough. Negative for chest tightness and wheezing.   Cardiovascular: Negative for chest pain.  Gastrointestinal: Negative for abdominal pain.  Neurological: Positive for dizziness (a little) and headaches.    Social History  Substance Use Topics  . Smoking status: Never Smoker  . Smokeless tobacco: Never Used  . Alcohol use No   Objective:   BP 110/66 (BP Location: Left Arm, Patient Position: Sitting, Cuff Size: Normal)   Pulse 94   Temp 98.2 F (36.8 C) (Oral)   Resp 16   Wt 134 lb (60.8 kg)   SpO2 97%   BMI 24.51 kg/m    Physical Exam  Constitutional: She appears well-developed and well-nourished. No distress.  HENT:  Head: Normocephalic and atraumatic.  Right Ear: Hearing, tympanic membrane, external ear and ear canal normal.  Left Ear: Hearing, tympanic membrane, external ear and ear canal normal.  Nose: Right sinus exhibits maxillary sinus tenderness. Right sinus exhibits no frontal  sinus tenderness. Left sinus exhibits maxillary sinus tenderness. Left sinus exhibits no frontal sinus tenderness.  Mouth/Throat: Uvula is midline, oropharynx is clear and moist and mucous membranes are normal. No oropharyngeal exudate.  Neck: Normal range of motion. Neck supple. No tracheal deviation present. No thyromegaly present.  Cardiovascular: Normal rate, regular rhythm and normal heart sounds.  Exam reveals no gallop and no friction rub.   No murmur heard. Pulmonary/Chest: Effort normal and breath sounds normal. No stridor. No respiratory distress. She has no wheezes. She has no rales.  Lymphadenopathy:    She has  no cervical adenopathy.  Skin: She is not diaphoretic.  Vitals reviewed.     Assessment & Plan:     1. Acute maxillary sinusitis, recurrence not specified Worsening symptoms that have not responded to OTC medications. Will give augmentin as below. Continue allergy medications. Stay well hydrated and get plenty of rest. Call if no symptom improvement or if symptoms worsen. - amoxicillin-clavulanate (AUGMENTIN) 875-125 MG tablet; Take 1 tablet by mouth 2 (two) times daily.  Dispense: 20 tablet; Refill: 0       Mar Daring, PA-C  [facility]

## 2017-02-07 ENCOUNTER — Telehealth: Payer: Self-pay | Admitting: Physician Assistant

## 2017-02-07 DIAGNOSIS — B379 Candidiasis, unspecified: Secondary | ICD-10-CM

## 2017-02-07 DIAGNOSIS — T3695XA Adverse effect of unspecified systemic antibiotic, initial encounter: Principal | ICD-10-CM

## 2017-02-07 MED ORDER — FLUCONAZOLE 150 MG PO TABS: 150.0000 mg | ORAL_TABLET | Freq: Once | ORAL | 0 refills | Status: AC

## 2017-02-07 NOTE — Telephone Encounter (Signed)
Please review. Thanks!  

## 2017-02-07 NOTE — Telephone Encounter (Signed)
Pt states she has been taking an antibiotic for a sinus infection and is have some itching in her vaginal area.  Pt is requesting a Rx to help with this.  [facility].  QD#[phone]/[id number]

## 2017-02-07 NOTE — Telephone Encounter (Signed)
Diflucan sent to [facility] 

## 2017-02-24 DIAGNOSIS — M7711 Lateral epicondylitis, right elbow: Secondary | ICD-10-CM | POA: Diagnosis not present

## 2017-02-24 DIAGNOSIS — M25521 Pain in right elbow: Secondary | ICD-10-CM | POA: Diagnosis not present

## 2017-02-27 DIAGNOSIS — M25521 Pain in right elbow: Secondary | ICD-10-CM | POA: Diagnosis not present

## 2017-02-27 DIAGNOSIS — M7711 Lateral epicondylitis, right elbow: Secondary | ICD-10-CM | POA: Diagnosis not present

## 2017-03-06 ENCOUNTER — Ambulatory Visit (INDEPENDENT_AMBULATORY_CARE_PROVIDER_SITE_OTHER): Payer: PPO | Admitting: Physician Assistant

## 2017-03-06 ENCOUNTER — Encounter: Payer: Self-pay | Admitting: Physician Assistant

## 2017-03-06 VITALS — BP 112/62 | HR 80 | Temp 97.5°F | Resp 16 | Wt 133.0 lb

## 2017-03-06 DIAGNOSIS — N898 Other specified noninflammatory disorders of vagina: Secondary | ICD-10-CM

## 2017-03-06 DIAGNOSIS — L298 Other pruritus: Secondary | ICD-10-CM | POA: Diagnosis not present

## 2017-03-06 MED ORDER — HYDROCORTISONE 2.5 % EX CREA: TOPICAL_CREAM | Freq: Two times a day (BID) | CUTANEOUS | 0 refills | Status: DC

## 2017-03-06 NOTE — Patient Instructions (Signed)

## 2017-03-06 NOTE — Progress Notes (Signed)
Patient: Sarah Todd Female    DOB: 1946/07/11   71 y.o.   MRN: [id number] Visit Date: 03/06/2017  Today's Provider: Trinna Post, PA-C   Chief Complaint  Patient presents with  . Vaginitis    Recurrent; started about two days ago.   Subjective:      Sarah Todd is a 71 y/o woman with history of recurrent vaginal candidiasis, abx use in past month presenting today with genital itching. Pertinents below. Has used old terconazole cream without relief. Has been to dermatologist Dr. Marolyn Hammock at [place] and gotten hydrocortison 2.5% for her external genital area, which she said provided relief but she is out. She also has a history of using pH balancing wipes after bathroom usage.  Vaginal Itching  The patient's primary symptoms include genital itching. The patient's pertinent negatives include no genital lesions, genital odor, genital rash, missed menses, pelvic pain, vaginal bleeding or vaginal discharge. This is a recurrent problem. The current episode started in the past 7 days. The problem has been gradually improving. The problem affects the right side. She is not pregnant. Pertinent negatives include no abdominal pain, anorexia, back pain, chills, constipation, diarrhea, discolored urine, dysuria, fever, flank pain, frequency, headaches, hematuria, joint pain, joint swelling, nausea, painful intercourse, rash, sore throat, urgency or vomiting. She has tried antifungals for the symptoms. The treatment provided moderate relief.         Allergies  Allergen Reactions  . Lisinopril Anaphylaxis  . Shrimp [Shellfish Allergy] Anaphylaxis  . Aspercreme [Trolamine Salicylate] Rash     Current Outpatient Prescriptions:  .  acetaminophen (TYLENOL) 650 MG CR tablet, Take 650 mg by mouth every 4 (four) hours as needed for pain., Disp: , Rfl:  .  amoxicillin-clavulanate (AUGMENTIN) 875-125 MG tablet, Take 1 tablet by mouth 2 (two) times daily., Disp: 20 tablet, Rfl:  0 .  aspirin 81 MG tablet, Take 81 mg by mouth daily. 1/2 tab po qd, Disp: , Rfl:  .  Calcium Citrate 200 MG TABS, Take 1 tablet by mouth daily. , Disp: , Rfl:  .  Cholecalciferol (VITAMIN D-3 PO), Take 1,000 Units by mouth daily. AM, Disp: , Rfl:  .  estradiol (ESTRACE) 1 MG tablet, TAKE ONE (1) TABLET BY MOUTH EVERY DAY, Disp: 30 tablet, Rfl: 5 .  gabapentin (NEURONTIN) 600 MG tablet, Take 600 mg by mouth 2 (two) times daily. , Disp: , Rfl:  .  losartan (COZAAR) 50 MG tablet, Take 1 tablet (50 mg total) by mouth every morning., Disp: 30 tablet, Rfl: 5 .  Multiple Vitamin (MULTIVITAMIN) capsule, Take 1 capsule by mouth daily. AM, Disp: , Rfl:  .  Omega 3 1000 MG CAPS, Take 1,000 mg by mouth daily. AM, Disp: , Rfl:  .  pantoprazole (PROTONIX) 40 MG tablet, Take 1 tablet (40 mg total) by mouth daily., Disp: 30 tablet, Rfl: 3 .  vitamin C (ASCORBIC ACID) 500 MG tablet, Take by mouth., Disp: , Rfl:   Review of Systems  Constitutional: Negative for chills and fever.  HENT: Negative for sore throat.   Gastrointestinal: Negative for abdominal pain, anorexia, constipation, diarrhea, nausea and vomiting.  Genitourinary: Negative.  Negative for dysuria, flank pain, frequency, hematuria, missed menses, pelvic pain, urgency and vaginal discharge.  Musculoskeletal: Negative for back pain and joint pain.  Skin: Negative for rash.  Neurological: Negative for headaches.    Social History  Substance Use Topics  . Smoking status: Never Smoker  . Smokeless  tobacco: Never Used  . Alcohol use No   Objective:   BP 112/62 (BP Location: Left Arm, Patient Position: Sitting, Cuff Size: Normal)   Pulse 80   Temp (!) 97.5 F (36.4 C) (Oral)   Resp 16   Wt 133 lb (60.3 kg)   BMI 24.33 kg/m  Vitals:   03/06/17 1337  BP: 112/62  Pulse: 80  Resp: 16  Temp: (!) 97.5 F (36.4 C)  TempSrc: Oral  Weight: 133 lb (60.3 kg)     Physical Exam  Constitutional: She is oriented to person, place, and time.  She appears well-developed and well-nourished.  Genitourinary: There is no rash, tenderness, lesion or injury on the right labia. There is no rash, tenderness, lesion or injury on the left labia. Cervix exhibits no motion tenderness, no discharge and no friability. No erythema, tenderness or bleeding in the vagina. No foreign body in the vagina. No signs of injury around the vagina. Vaginal discharge found.  Genitourinary Comments: No external lesions or rash. Possibly some vaginal discharge.  Neurological: She is alert and oriented to person, place, and time.  Skin: Skin is warm and dry.        Assessment & Plan:     1. Vaginal itching  NuSwab sent. Terconazole cream provided no relief. Difficult to determine if she has vaginal discharge or if its vaginal cream inserted. Refilled hydrocortisone cream as this is what dermatologist prescribed her for vaginal itching. Will refill with precautions to use sparingly to avoid skin thinning. Requested dermatologic records from [place]. Discontinue if worsening. Discontinue pH wipes. May need to go back to dermatology.  - NuSwab Vaginitis (VG) - hydrocortisone 2.5 % cream; Apply topically 2 (two) times daily. Two times daily externally vaginally for 5 days, then only for flares.  Dispense: 30 g; Refill: 0  Return if symptoms worsen or fail to improve.  The entirety of the information documented in the History of Present Illness, Review of Systems and Physical Exam were personally obtained by me. Portions of this information were initially documented by Ashley Royalty, CMA and reviewed by me for thoroughness and accuracy.         Trinna Post, PA-C  [facility]

## 2017-03-13 LAB — NUSWAB VAGINITIS (VG)
Candida albicans, NAA: POSITIVE — AB
Candida glabrata, NAA: NEGATIVE
Trich vag by NAA: NEGATIVE

## 2017-03-14 ENCOUNTER — Telehealth: Payer: Self-pay

## 2017-03-14 DIAGNOSIS — B3731 Acute candidiasis of vulva and vagina: Secondary | ICD-10-CM

## 2017-03-14 DIAGNOSIS — B373 Candidiasis of vulva and vagina: Secondary | ICD-10-CM

## 2017-03-14 MED ORDER — FLUCONAZOLE 150 MG PO TABS: ORAL_TABLET | ORAL | 0 refills | Status: DC

## 2017-03-14 NOTE — Telephone Encounter (Signed)
Advised patient of results. Patient reports that she would prefer the pill instead of the cream. She states that it has gotten a little better, but thinks the pill will completely resolve the issue. She uses [employer], [employer]. Thanks!

## 2017-03-14 NOTE — Telephone Encounter (Signed)
Patient advised.

## 2017-03-14 NOTE — Telephone Encounter (Signed)
Diflucan sent to [facility] 

## 2017-03-14 NOTE — Telephone Encounter (Signed)
-----   Message from Trinna Post, [place] sent at 03/14/2017  8:22 AM EDT ----- NuSwab came back positive for yeast. We can do diflucan the pill, but with patient's history of recurrent yeast infection, I would recommend the vaginal cream terconazole. What patient wishes I'll send in, thanks.

## 2017-03-17 DIAGNOSIS — H2513 Age-related nuclear cataract, bilateral: Secondary | ICD-10-CM | POA: Diagnosis not present

## 2017-03-20 DIAGNOSIS — H0012 Chalazion right lower eyelid: Secondary | ICD-10-CM | POA: Diagnosis not present

## 2017-03-25 DIAGNOSIS — H0012 Chalazion right lower eyelid: Secondary | ICD-10-CM | POA: Diagnosis not present

## 2017-04-16 DIAGNOSIS — H0011 Chalazion right upper eyelid: Secondary | ICD-10-CM | POA: Diagnosis not present

## 2017-04-25 DIAGNOSIS — H0011 Chalazion right upper eyelid: Secondary | ICD-10-CM | POA: Diagnosis not present

## 2017-05-22 DIAGNOSIS — H0011 Chalazion right upper eyelid: Secondary | ICD-10-CM | POA: Diagnosis not present

## 2017-06-18 ENCOUNTER — Telehealth: Payer: Self-pay | Admitting: Physician Assistant

## 2017-06-18 NOTE — Telephone Encounter (Signed)
ROI ([facility]) faxed to [facility].

## 2017-07-01 ENCOUNTER — Ambulatory Visit (INDEPENDENT_AMBULATORY_CARE_PROVIDER_SITE_OTHER): Payer: PPO | Admitting: Physician Assistant

## 2017-07-01 VITALS — BP 120/70 | HR 72 | Temp 97.5°F | Resp 16 | Wt 133.0 lb

## 2017-07-01 DIAGNOSIS — J0140 Acute pansinusitis, unspecified: Secondary | ICD-10-CM

## 2017-07-01 MED ORDER — AZITHROMYCIN 250 MG PO TABS: ORAL_TABLET | ORAL | 0 refills | Status: DC

## 2017-07-01 NOTE — Progress Notes (Signed)
Patient: Sarah Todd Female    DOB: 09-01-1946   71 y.o.   MRN: [id number] Visit Date: 07/01/2017  Today's Provider: Mar Daring, PA-C   Chief Complaint  Patient presents with  . Ear Pain   Subjective:    HPI Patient here today C/O ear pain sharp pain and had some sore throat. Patient reports that she has tried peroxide to flush out for ear wax. Patient reports that pain is worse when swallowing. Patient reports that she does have some sinus pressure and headache. Patient reports that she does have frequent sinus drainage. She has been around a sick contact. Symptoms started last week and have been worsening.    Allergies  Allergen Reactions  . Lisinopril Anaphylaxis  . Shrimp [Shellfish Allergy] Anaphylaxis  . Aspercreme [Trolamine Salicylate] Rash     Current Outpatient Prescriptions:  .  acetaminophen (TYLENOL) 650 MG CR tablet, Take 650 mg by mouth every 4 (four) hours as needed for pain., Disp: , Rfl:  .  aspirin 81 MG tablet, Take 81 mg by mouth daily. 1/2 tab po qd, Disp: , Rfl:  .  Calcium Citrate 200 MG TABS, Take 1 tablet by mouth daily. , Disp: , Rfl:  .  Cholecalciferol (VITAMIN D-3 PO), Take 1,000 Units by mouth daily. AM, Disp: , Rfl:  .  estradiol (ESTRACE) 1 MG tablet, TAKE ONE (1) TABLET BY MOUTH EVERY DAY, Disp: 30 tablet, Rfl: 5 .  gabapentin (NEURONTIN) 600 MG tablet, Take 600 mg by mouth 2 (two) times daily. , Disp: , Rfl:  .  losartan (COZAAR) 50 MG tablet, Take 1 tablet (50 mg total) by mouth every morning., Disp: 30 tablet, Rfl: 5 .  Multiple Vitamin (MULTIVITAMIN) capsule, Take 1 capsule by mouth daily. AM, Disp: , Rfl:  .  Omega 3 1000 MG CAPS, Take 1,000 mg by mouth daily. AM, Disp: , Rfl:  .  triamcinolone ointment (KENALOG) 0.1 %, Apply 1 application topically 2 (two) times daily., Disp: , Rfl:  .  vitamin C (ASCORBIC ACID) 500 MG tablet, Take by mouth., Disp: , Rfl:   Review of Systems  Constitutional: Negative.   HENT:  Positive for congestion, ear pain, postnasal drip, rhinorrhea, sinus pain, sinus pressure and sore throat. Negative for ear discharge and hearing loss.   Respiratory: Negative for cough, chest tightness and shortness of breath.   Cardiovascular: Negative.   Gastrointestinal: Negative.   Neurological: Positive for headaches. Negative for dizziness.    Social History  Substance Use Topics  . Smoking status: Never Smoker  . Smokeless tobacco: Never Used  . Alcohol use No   Objective:   BP 120/70 (BP Location: Left Arm, Patient Position: Sitting, Cuff Size: Normal)   Pulse 72   Temp (!) 97.5 F (36.4 C) (Oral)   Resp 16   Wt 133 lb (60.3 kg)   SpO2 99%   BMI 24.33 kg/m  Vitals:   07/01/17 1106  BP: 120/70  Pulse: 72  Resp: 16  Temp: (!) 97.5 F (36.4 C)  TempSrc: Oral  SpO2: 99%  Weight: 133 lb (60.3 kg)     Physical Exam  Constitutional: She appears well-developed and well-nourished. No distress.  HENT:  Head: Normocephalic and atraumatic.  Right Ear: Hearing, tympanic membrane, external ear and ear canal normal.  Left Ear: Hearing, tympanic membrane, external ear and ear canal normal.  Nose: Right sinus exhibits maxillary sinus tenderness and frontal sinus tenderness. Left sinus exhibits maxillary sinus  tenderness and frontal sinus tenderness.  Mouth/Throat: Uvula is midline, oropharynx is clear and moist and mucous membranes are normal. No oropharyngeal exudate.  Neck: Normal range of motion. Neck supple. No tracheal deviation present. No thyromegaly present.  Cardiovascular: Normal rate, regular rhythm and normal heart sounds.  Exam reveals no gallop and no friction rub.   No murmur heard. Pulmonary/Chest: Effort normal and breath sounds normal. No stridor. No respiratory distress. She has no wheezes. She has no rales.  Lymphadenopathy:    She has no cervical adenopathy.  Skin: She is not diaphoretic.  Vitals reviewed.      Assessment & Plan:     1. Acute  pansinusitis, recurrence not specified Worsening symptoms that have not responded to OTC medications. Will give azithromycin as below. Continue allergy medications. Stay well hydrated and get plenty of rest. Call if no symptom improvement or if symptoms worsen. - azithromycin (ZITHROMAX) 250 MG tablet; Take 2 tablets PO on day one, and one tablet PO daily thereafter until completed.  Dispense: 6 tablet; Refill: 0       Mar Daring, PA-C  [facility]

## 2017-07-01 NOTE — Patient Instructions (Signed)

## 2017-07-04 ENCOUNTER — Telehealth: Payer: Self-pay | Admitting: Physician Assistant

## 2017-07-04 DIAGNOSIS — J0140 Acute pansinusitis, unspecified: Secondary | ICD-10-CM

## 2017-07-04 MED ORDER — AZITHROMYCIN 250 MG PO TABS: ORAL_TABLET | ORAL | 0 refills | Status: DC

## 2017-07-04 NOTE — Telephone Encounter (Signed)
Please Review.  Thanks,  -Tamika Shropshire 

## 2017-07-04 NOTE — Telephone Encounter (Signed)
Patient advised.  Thanks,  -Joseline 

## 2017-07-04 NOTE — Telephone Encounter (Signed)
Pt states she was seen on Tuesday and is still having sinus congestion.  Pt is asking if she can get a refill on th Rx.  [facility].  CB#[phone]/[id number]

## 2017-07-04 NOTE — Telephone Encounter (Signed)
Sent refill

## 2017-07-16 ENCOUNTER — Other Ambulatory Visit: Payer: Self-pay | Admitting: Physician Assistant

## 2017-07-16 DIAGNOSIS — I10 Essential (primary) hypertension: Secondary | ICD-10-CM

## 2017-07-18 ENCOUNTER — Telehealth: Payer: Self-pay | Admitting: Physician Assistant

## 2017-07-18 DIAGNOSIS — H9203 Otalgia, bilateral: Secondary | ICD-10-CM

## 2017-07-18 MED ORDER — NEOMYCIN-POLYMYXIN-HC 3.5-10000-1 OT SOLN: 3.0000 [drp] | Freq: Three times a day (TID) | OTIC | 0 refills | Status: DC

## 2017-07-18 NOTE — Telephone Encounter (Signed)
Please advise. Thank Edrick Kins, RMA

## 2017-07-18 NOTE — Telephone Encounter (Signed)
Pt stated that she has taken 2 rounds of azithromycin (ZITHROMAX) 250 MG tablet and her ear pain hasn't improved. Pt is requesting a different antibiotic to be sent to [place]. Please advise. Thanks TNP

## 2017-07-18 NOTE — Telephone Encounter (Signed)
Is it only ear pain? If so I will give her drops instead of systemic.

## 2017-07-18 NOTE — Telephone Encounter (Signed)
Ok lets try ear drops. These have been sent to [place]

## 2017-07-18 NOTE — Telephone Encounter (Signed)
Pt states it is just ear pain and pain radiates some on the side of her [employer], RMA

## 2017-07-18 NOTE — Telephone Encounter (Signed)
Husband advised as below-Anastasiya V Hopkins, RMA

## 2017-09-18 ENCOUNTER — Encounter: Payer: Self-pay | Admitting: Physician Assistant

## 2017-09-18 ENCOUNTER — Ambulatory Visit: Payer: PPO | Admitting: Physician Assistant

## 2017-09-18 VITALS — BP 120/70 | HR 90 | Temp 97.9°F | Resp 16 | Wt 135.8 lb

## 2017-09-18 DIAGNOSIS — R350 Frequency of micturition: Secondary | ICD-10-CM | POA: Diagnosis not present

## 2017-09-18 DIAGNOSIS — N3281 Overactive bladder: Secondary | ICD-10-CM

## 2017-09-18 LAB — POCT URINALYSIS DIPSTICK
Bilirubin, UA: NEGATIVE
Glucose, UA: NEGATIVE
KETONES UA: NEGATIVE
LEUKOCYTES UA: NEGATIVE
NITRITE UA: NEGATIVE
PH UA: 7.5 (ref 5.0–8.0)
PROTEIN UA: NEGATIVE
RBC UA: NEGATIVE
SPEC GRAV UA: 1.01 (ref 1.010–1.025)
Urobilinogen, UA: 0.2 E.U./dL

## 2017-09-18 MED ORDER — OXYBUTYNIN CHLORIDE ER 5 MG PO TB24: 5.0000 mg | ORAL_TABLET | Freq: Every day | ORAL | 0 refills | Status: DC

## 2017-09-18 NOTE — Progress Notes (Signed)
Patient: Sarah Todd Female    DOB: May 11, 1946   72 y.o.   MRN: [id number] Visit Date: 09/18/2017  Today's Provider: Mar Daring, PA-C   Chief Complaint  Patient presents with  . Urinary Frequency   Subjective:    Urinary Frequency   This is a new problem. The current episode started more than 1 month ago. The problem occurs intermittently. The problem has been gradually worsening. The quality of the pain is described as burning. The patient is experiencing no pain. She is sexually active. Associated symptoms include frequency and urgency. Pertinent negatives include no chills, discharge, flank pain, hematuria or nausea. She has tried nothing for the symptoms.      Allergies  Allergen Reactions  . Lisinopril Anaphylaxis  . Shrimp [Shellfish Allergy] Anaphylaxis  . Aspercreme [Trolamine Salicylate] Rash     Current Outpatient Medications:  .  acetaminophen (TYLENOL) 650 MG CR tablet, Take 650 mg by mouth every 4 (four) hours as needed for pain., Disp: , Rfl:  .  aspirin 81 MG tablet, Take 81 mg by mouth daily. 1/2 tab po qd, Disp: , Rfl:  .  Calcium Citrate 200 MG TABS, Take 1 tablet by mouth daily. , Disp: , Rfl:  .  Cholecalciferol (VITAMIN D-3 PO), Take 1,000 Units by mouth daily. AM, Disp: , Rfl:  .  estradiol (ESTRACE) 1 MG tablet, TAKE ONE (1) TABLET BY MOUTH EVERY DAY, Disp: 30 tablet, Rfl: 5 .  gabapentin (NEURONTIN) 600 MG tablet, Take 600 mg by mouth 2 (two) times daily. , Disp: , Rfl:  .  losartan (COZAAR) 50 MG tablet, TAKE ONE TABLET BY MOUTH EVERY MORNING, Disp: 30 tablet, Rfl: 5 .  Multiple Vitamin (MULTIVITAMIN) capsule, Take 1 capsule by mouth daily. AM, Disp: , Rfl:  .  Omega 3 1000 MG CAPS, Take 1,000 mg by mouth daily. AM, Disp: , Rfl:  .  vitamin C (ASCORBIC ACID) 500 MG tablet, Take by mouth., Disp: , Rfl:   Review of Systems  Constitutional: Negative for chills.  Respiratory: Negative.   Cardiovascular: Negative.     Gastrointestinal: Negative for abdominal pain and nausea.  Genitourinary: Positive for enuresis (sometimes ), frequency and urgency. Negative for difficulty urinating, dysuria, flank pain and hematuria.  Musculoskeletal: Negative for back pain.    Social History   Tobacco Use  . Smoking status: Never Smoker  . Smokeless tobacco: Never Used  Substance Use Topics  . Alcohol use: No    Alcohol/week: 0.6 oz    Types: 1 Glasses of wine per week   Objective:   BP 120/70 (BP Location: Left Arm, Patient Position: Sitting, Cuff Size: Normal)   Pulse 90   Temp 97.9 F (36.6 C) (Oral)   Resp 16   Wt 135 lb 12.8 oz (61.6 kg)   BMI 24.84 kg/m    Physical Exam  Constitutional: She is oriented to person, place, and time. She appears well-developed and well-nourished. No distress.  Cardiovascular: Normal rate, regular rhythm and normal heart sounds. Exam reveals no gallop and no friction rub.  No murmur heard. Pulmonary/Chest: Effort normal and breath sounds normal. No respiratory distress. She has no wheezes. She has no rales.  Abdominal: Soft. Normal appearance and bowel sounds are normal. She exhibits no distension and no mass. There is no hepatosplenomegaly. There is no tenderness. There is no rebound, no guarding and no CVA tenderness.  Neurological: She is alert and oriented to person, place, and time.  Skin: Skin is warm and dry. She is not diaphoretic.  Vitals reviewed.     Assessment & Plan:     1. OAB (overactive bladder) Will start Ditropan as below. Patient may increase to 2 tabs PO at bedtime for symptom control if needed. I will see her back in April for her CPE and we will re-evaluate then. - oxybutynin (DITROPAN-XL) 5 MG 24 hr tablet; Take 1 tablet (5 mg total) by mouth at bedtime.  Dispense: 90 tablet; Refill: 0  2. Frequent urination UA normal.  - POCT urinalysis dipstick       Mar Daring, PA-C  [facility]

## 2017-09-18 NOTE — Patient Instructions (Signed)
Overactive Bladder, Adult Overactive bladder is a group of urinary symptoms. With overactive bladder, you may suddenly feel the need to pass urine (urinate) right away. After feeling this sudden urge, you might also leak urine if you cannot get to the bathroom fast enough (urinary incontinence). These symptoms might interfere with your daily work or social activities. Overactive bladder symptoms may also wake you up at night. Overactive bladder affects the nerve signals between your bladder and your brain. Your bladder may get the signal to empty before it is full. Very sensitive muscles can also make your bladder squeeze too soon. What are the causes? Many things can cause an overactive bladder. Possible causes include:  Urinary tract infection.  Infection of nearby tissues, such as the prostate.  Prostate enlargement.  Being pregnant with twins or more (multiples).  Surgery on the uterus or urethra.  Bladder stones, inflammation, or tumors.  Drinking too much caffeine or alcohol.  Certain medicines, especially those that you take to help your body get rid of extra fluid (diuretics) by increasing urine production.  Muscle or nerve weakness, especially from: ? A spinal cord injury. ? Stroke. ? Multiple sclerosis. ? Parkinson disease.  Diabetes. This can cause a high urine volume that fills the bladder so quickly that the normal urge to urinate is triggered very strongly.  Constipation. A buildup of too much stool can put pressure on your bladder.  What increases the risk? You may be at greater risk for overactive bladder if you:  Are an older adult.  Smoke.  Are going through menopause.  Have prostate problems.  Have a neurological disease, such as stroke, dementia, Parkinson disease, or multiple sclerosis (MS).  Eat or drink things that irritate the bladder. These include alcohol, spicy food, and caffeine.  Are overweight or obese.  What are the signs or  symptoms? The signs and symptoms of an overactive bladder include:  Sudden, strong urges to urinate.  Leaking urine.  Urinating eight or more times per day.  Waking up to urinate two or more times per night.  How is this diagnosed? Your health care provider may suspect overactive bladder based on your symptoms. The health care provider will do a physical exam and take your medical history. Blood or urine tests may also be done. For example, you might need to have a bladder function test to check how well you can hold your urine. You might also need to see a health care provider who specializes in the urinary tract (urologist). How is this treated? Treatment for overactive bladder depends on the cause of your condition and whether it is mild or severe. Certain treatments can be done in your health care provider's office or clinic. You can also make lifestyle changes at home. Options include: Behavioral Treatments  Biofeedback. A specialist uses sensors to help you become aware of your body's signals.  Keeping a daily log of when you need to urinate and what happens after the urge. This may help you manage your condition.  Bladder training. This helps you learn to control the urge to urinate by following a schedule that directs you to urinate at regular intervals (timed voiding). At first, you might have to wait a few minutes after feeling the urge. In time, you should be able to schedule bathroom visits an hour or more apart.  Kegel exercises. These are exercises to strengthen the pelvic floor muscles, which support the bladder. Toning these muscles can help you control urination, even if your  bladder muscles are overactive. A specialist will teach you how to do these exercises correctly. They require daily practice.  Weight loss. If you are obese or overweight, losing weight might relieve your symptoms of overactive bladder. Talk to your health care provider about losing weight and whether  there is a specific program or method that would work best for you.  Diet change. This might help if constipation is making your overactive bladder worse. Your health care provider or a dietitian can explain ways to change what you eat to ease constipation. You might also need to consume less alcohol and caffeine or drink other fluids at different times of the day.  Stopping smoking.  Wearing pads to absorb leakage while you wait for other treatments to take effect. Physical Treatments  Electrical stimulation. Electrodes send gentle pulses of electricity to strengthen the nerves or muscles that help to control the bladder. Sometimes, the electrodes are placed outside of the body. In other cases, they might be placed inside the body (implanted). This treatment can take several months to have an effect.  Supportive devices. Women may need a plastic device that fits into the vagina and supports the bladder (pessary). Medicines Several medicines can help treat overactive bladder and are usually used along with other treatments. Some are injected into the muscles involved in urination. Others come in pill form. Your health care provider may prescribe:  Antispasmodics. These medicines block the signals that the nerves send to the bladder. This keeps the bladder from releasing urine at the wrong time.  Tricyclic antidepressants. These types of antidepressants also relax bladder muscles.  Surgery  You may have a device implanted to help manage the nerve signals that indicate when you need to urinate.  You may have surgery to implant electrodes for electrical stimulation.  Sometimes, very severe cases of overactive bladder require surgery to change the shape of the bladder. Follow these instructions at home:  Take medicines only as directed by your health care provider.  Use any implants or a pessary as directed by your health care provider.  Make any diet or lifestyle changes that are  recommended by your health care provider. These might include: ? Drinking less fluid or drinking at different times of the day. If you need to urinate often during the night, you may need to stop drinking fluids early in the evening. ? Cutting down on caffeine or alcohol. Both can make an overactive bladder worse. Caffeine is found in coffee, tea, and sodas. ? Doing Kegel exercises to strengthen muscles. ? Losing weight if you need to. ? Eating a healthy and balanced diet to prevent constipation.  Keep a journal or log to track how much and when you drink and also when you feel the need to urinate. This will help your health care provider to monitor your condition. Contact a health care provider if:  Your symptoms do not get better after treatment.  Your pain and discomfort are getting worse.  You have more frequent urges to urinate.  You have a fever. Get help right away if: You are not able to control your bladder at all. This information is not intended to replace advice given to you by your health care provider. Make sure you discuss any questions you have with your health care provider. Document Released: 06/15/2009 Document Revised: 01/25/2016 Document Reviewed: 01/12/2014 Elsevier Interactive Patient Education  2018 [employer] Oxybutynin extended-release tablets What is this medicine? OXYBUTYNIN (ox i BYOO ti nin) is used to   treat overactive bladder. This medicine reduces the amount of bathroom visits. It may also help to control wetting accidents. This medicine may be used for other purposes; ask your health care provider or pharmacist if you have questions. COMMON BRAND NAME(S): Ditropan XL What should I tell my health care provider before I take this medicine? They need to know if you have any of these conditions: -autonomic neuropathy -dementia -difficulty passing urine -glaucoma -intestinal obstruction -kidney disease -liver disease -myasthenia gravis -Parkinson's  disease -an unusual or allergic reaction to oxybutynin, other medicines, foods, dyes, or preservatives -pregnant or trying to get pregnant -breast-feeding How should I use this medicine? Take this medicine by mouth with a glass of water. Swallow whole, do not crush, cut, or chew. Follow the directions on the prescription label. You can take this medicine with or without food. Take your doses at regular intervals. Do not take your medicine more often than directed. Talk to your pediatrician regarding the use of this medicine in children. Special care may be needed. While this drug may be prescribed for children as young as 6 years for selected conditions, precautions do apply. Overdosage: If you think you have taken too much of this medicine contact a poison control center or emergency room at once. NOTE: This medicine is only for you. Do not share this medicine with others. What if I miss a dose? If you miss a dose, take it as soon as you can. If it is almost time for your next dose, take only that dose. Do not take double or extra doses. What may interact with this medicine? -antihistamines for allergy, cough and cold -atropine -certain medicines for bladder problems like oxybutynin, tolterodine -certain medicines for Parkinson's disease like benztropine, trihexyphenidyl -certain medicines for stomach problems like dicyclomine, hyoscyamine -certain medicines for travel sickness like scopolamine -clarithromycin -erythromycin -ipratropium -medicines for fungal infections, like fluconazole, itraconazole, ketoconazole or voriconazole This list may not describe all possible interactions. Give your health care provider a list of all the medicines, herbs, non-prescription drugs, or dietary supplements you use. Also tell them if you smoke, drink alcohol, or use illegal drugs. Some items may interact with your medicine. What should I watch for while using this medicine? It may take a few weeks to  notice the full benefit from this medicine. You may need to limit your intake tea, coffee, caffeinated sodas, and alcohol. These drinks may make your symptoms worse. You may get drowsy or dizzy. Do not drive, use machinery, or do anything that needs mental alertness until you know how this medicine affects you. Do not stand or sit up quickly, especially if you are an older patient. This reduces the risk of dizzy or fainting spells. Alcohol may interfere with the effect of this medicine. Avoid alcoholic drinks. Your mouth may get dry. Chewing sugarless gum or sucking hard candy, and drinking plenty of water may help. Contact your doctor if the problem does not go away or is severe. This medicine may cause dry eyes and blurred vision. If you wear contact lenses, you may feel some discomfort. Lubricating drops may help. See your eyecare professional if the problem does not go away or is severe. You may notice the shells of the tablets in your stool from time to time. This is normal. Avoid extreme heat. This medicine can cause you to sweat less than normal. Your body temperature could increase to dangerous levels, which may lead to heat stroke. What side effects may I notice from receiving   this medicine? Side effects that you should report to your doctor or health care professional as soon as possible: -allergic reactions like skin rash, itching or hives, swelling of the face, lips, or tongue -agitation -breathing problems -confusion -fever -flushing (reddening of the skin) -hallucinations -memory loss -pain or difficulty passing urine -palpitations -unusually weak or tired Side effects that usually do not require medical attention (report to your doctor or health care professional if they continue or are bothersome): -constipation -headache -sexual difficulties (impotence) This list may not describe all possible side effects. Call your doctor for medical advice about side effects. You may report  side effects to FDA at 1-800-FDA-1088. Where should I keep my medicine? Keep out of the reach of children. Store at room temperature between 15 and 30 degrees C (59 and 86 degrees F). Protect from moisture and humidity. Throw away any unused medicine after the expiration date. NOTE: This sheet is a summary. It may not cover all possible information. If you have questions about this medicine, talk to your doctor, pharmacist, or health care provider.  2018 Elsevier/Gold Standard (2013-11-04 10:57:06)  

## 2017-10-21 DIAGNOSIS — H04203 Unspecified epiphora, bilateral lacrimal glands: Secondary | ICD-10-CM | POA: Diagnosis not present

## 2017-10-21 DIAGNOSIS — H5713 Ocular pain, bilateral: Secondary | ICD-10-CM | POA: Diagnosis not present

## 2017-11-05 ENCOUNTER — Other Ambulatory Visit: Payer: Self-pay | Admitting: Physician Assistant

## 2017-11-05 DIAGNOSIS — Z780 Asymptomatic menopausal state: Secondary | ICD-10-CM

## 2017-11-28 DIAGNOSIS — M7062 Trochanteric bursitis, left hip: Secondary | ICD-10-CM | POA: Insufficient documentation

## 2017-11-28 DIAGNOSIS — M5416 Radiculopathy, lumbar region: Secondary | ICD-10-CM | POA: Diagnosis not present

## 2017-11-28 DIAGNOSIS — M792 Neuralgia and neuritis, unspecified: Secondary | ICD-10-CM | POA: Diagnosis not present

## 2017-12-01 ENCOUNTER — Ambulatory Visit (INDEPENDENT_AMBULATORY_CARE_PROVIDER_SITE_OTHER): Payer: PPO | Admitting: Physician Assistant

## 2017-12-01 ENCOUNTER — Encounter: Payer: Self-pay | Admitting: Physician Assistant

## 2017-12-01 VITALS — BP 144/76 | HR 74 | Temp 98.3°F | Resp 16 | Wt 142.0 lb

## 2017-12-01 DIAGNOSIS — J3089 Other allergic rhinitis: Secondary | ICD-10-CM

## 2017-12-01 DIAGNOSIS — M25552 Pain in left hip: Secondary | ICD-10-CM | POA: Diagnosis not present

## 2017-12-01 NOTE — Patient Instructions (Addendum)
Xyzal daily  Coricidin (decongestant for high blood pressure) Flonase two sprays each nostril daily    Allergic Rhinitis, Adult Allergic rhinitis is an allergic reaction that affects the mucous membrane inside the nose. It causes sneezing, a runny or stuffy nose, and the feeling of mucus going down the back of the throat (postnasal drip). Allergic rhinitis can be mild to severe. There are two types of allergic rhinitis:  Seasonal. This type is also called hay fever. It happens only during certain seasons.  Perennial. This type can happen at any time of the year.  What are the causes? This condition happens when the body's defense system (immune system) responds to certain harmless substances called allergens as though they were germs.  Seasonal allergic rhinitis is triggered by pollen, which can come from grasses, trees, and weeds. Perennial allergic rhinitis may be caused by:  House dust mites.  Pet dander.  Mold spores.  What are the signs or symptoms? Symptoms of this condition include:  Sneezing.  Runny or stuffy nose (nasal congestion).  Postnasal drip.  Itchy nose.  Tearing of the eyes.  Trouble sleeping.  Daytime sleepiness.  How is this diagnosed? This condition may be diagnosed based on:  Your medical history.  A physical exam.  Tests to check for related conditions, such as: ? Asthma. ? Pink eye. ? Ear infection. ? Upper respiratory infection.  Tests to find out which allergens trigger your symptoms. These may include skin or blood tests.  How is this treated? There is no cure for this condition, but treatment can help control symptoms. Treatment may include:  Taking medicines that block allergy symptoms, such as antihistamines. Medicine may be given as a shot, nasal spray, or pill.  Avoiding the allergen.  Desensitization. This treatment involves getting ongoing shots until your body becomes less sensitive to the allergen. This treatment may be  done if other treatments do not help.  If taking medicine and avoiding the allergen does not work, new, stronger medicines may be prescribed.  Follow these instructions at home:  Find out what you are allergic to. Common allergens include smoke, dust, and pollen.  Avoid the things you are allergic to. These are some things you can do to help avoid allergens: ? Replace carpet with wood, tile, or vinyl flooring. Carpet can trap dander and dust. ? Do not smoke. Do not allow smoking in your home. ? Change your heating and air conditioning filter at least once a month. ? During allergy season:  Keep windows closed as much as possible.  Plan outdoor activities when pollen counts are lowest. This is usually during the evening hours.  When coming indoors, change clothing and shower before sitting on furniture or bedding.  Take over-the-counter and prescription medicines only as told by your health care provider.  Keep all follow-up visits as told by your health care provider. This is important. Contact a health care provider if:  You have a fever.  You develop a persistent cough.  You make whistling sounds when you breathe (you wheeze).  Your symptoms interfere with your normal daily activities. Get help right away if:  You have shortness of breath. Summary  This condition can be managed by taking medicines as directed and avoiding allergens.  Contact your health care provider if you develop a persistent cough or fever.  During allergy season, keep windows closed as much as possible. This information is not intended to replace advice given to you by your health care provider. Make  sure you discuss any questions you have with your health care provider. Document Released: 05/14/2001 Document Revised: 09/26/2016 Document Reviewed: 09/26/2016 Elsevier Interactive Patient Education  [employer].

## 2017-12-01 NOTE — Progress Notes (Signed)
[place]  Chief Complaint  Patient presents with  . URI    Subjective:    Patient ID: Sarah Todd, female    DOB: 1946-07-11, 73 y.o.   MRN: [id number]  Upper Respiratory Infection: Sarah Todd is a  72 y.o. female with a past medical history significant for allergiescomplaining of symptoms of a URI, possible sinusitis. Symptoms include left ear pain. Onset of symptoms was 3 days ago, gradually worsening since that time. She also c/o congestion, cough described as nonproductive, lightheadedness, nasal congestion, shortness of breath, sinus pressure and sore throat for the past 4 days .  She is drinking plenty of fluids. Evaluation to date: none. Treatment to date: antihistamines and decongestants. The treatment has provided no relief. She has taken Xyzal for two days.  Also complains of left hip pain ongoing for several months. She has had visit with her spine surgeon who does not address her other joints and recommended she follow up with her PCP or orthopedics. She had tenderness previously and pain with ROM. She describes more pain when sitting or lying on her left hip. She does not have weakness, denies falls or injuries or prior surgeries on this hip.   Review of Systems  Constitutional: Positive for chills, diaphoresis and fatigue. Negative for activity change, appetite change, fever and unexpected weight change.  HENT: Positive for congestion, ear pain (Left ear pain), rhinorrhea, sinus pressure, sinus pain, sore throat and voice change. Negative for ear discharge, postnasal drip and trouble swallowing.   Eyes: Negative.   Respiratory: Positive for cough and shortness of breath. Negative for apnea, choking, chest tightness, wheezing and stridor.   Gastrointestinal: Negative.   Musculoskeletal: Positive for neck pain and neck stiffness.  Neurological: Positive for headaches. Negative for dizziness and light-headedness.        Objective:   BP (!) 144/76 (BP Location: Right Arm, Patient Position: Sitting, Cuff Size: Normal)   Pulse 74   Temp 98.3 F (36.8 C) (Oral)   Resp 16   Wt 142 lb (64.4 kg)   SpO2 96%   BMI 25.97 kg/m   Patient Active Problem List   Diagnosis Date Noted  . Pain in elbow 11/07/2016  . Candidiasis of breast 07/20/2015  . Atrophic vaginitis 07/20/2015  . Constipation 06/20/2015  . Skin lesion of face 06/20/2015  . Tinea corporis 05/19/2015  . Allergic rhinitis 02/06/2015  . Arthritis 02/06/2015  . BP (high blood pressure) 02/06/2015  . OP (osteoporosis) 02/06/2015  . Shoulder strain 02/06/2015  . Dermatophytosis of groin 02/06/2015  . Candida vaginitis 02/06/2015    Outpatient Encounter Medications as of 12/01/2017  Medication Sig Note  . acetaminophen (TYLENOL) 650 MG CR tablet Take 650 mg by mouth every 4 (four) hours as needed for pain.   Marland Kitchen aspirin 81 MG tablet Take 81 mg by mouth daily. 1/2 tab po qd   . Calcium Citrate 200 MG TABS Take 1 tablet by mouth daily.  02/06/2015: Received from: [facility]  . Cholecalciferol (VITAMIN D-3 PO) Take 1,000 Units by mouth daily. AM   . estradiol (ESTRACE) 1 MG tablet TAKE ONE TABLET BY MOUTH EVERY DAY   . gabapentin (NEURONTIN) 600 MG tablet Take 600 mg by mouth 2 (two) times daily.    Marland Kitchen losartan (COZAAR) 50 MG tablet TAKE ONE TABLET BY MOUTH EVERY MORNING   . Multiple Vitamin (MULTIVITAMIN) capsule Take 1 capsule by mouth daily. AM   . Omega 3 1000  MG CAPS Take 1,000 mg by mouth daily. AM   . vitamin C (ASCORBIC ACID) 500 MG tablet Take by mouth. 02/06/2015: Received from: [facility]  . oxybutynin (DITROPAN-XL) 5 MG 24 hr tablet Take 1 tablet (5 mg total) by mouth at bedtime. (Patient not taking: Reported on 12/01/2017)    No facility-administered encounter medications on file as of 12/01/2017.     Allergies  Allergen Reactions  . Lisinopril Anaphylaxis  . Shrimp [Shellfish Allergy] Anaphylaxis  .  Aspercreme [Trolamine Salicylate] Rash       Physical Exam  Constitutional: She is oriented to person, place, and time. She appears well-developed and well-nourished.  HENT:  Right Ear: Tympanic membrane and external ear normal.  Left Ear: Tympanic membrane and external ear normal.  Mouth/Throat: Oropharynx is clear and moist. No oropharyngeal exudate.  Neck: Neck supple.  Cardiovascular: Normal rate and regular rhythm.  Pulmonary/Chest: Effort normal and breath sounds normal.  Lymphadenopathy:    She has no cervical adenopathy.  Neurological: She is alert and oriented to person, place, and time.  Skin: Skin is warm and dry.  Psychiatric: She has a normal mood and affect. Her behavior is normal.       Assessment & Plan:  1. Allergic rhinitis due to other allergic trigger, unspecified seasonality  Counseled on course of allergic rhinitis. Should be taking daily 2nd gen antihistamine for at least two weeks to have an effect and then continue this medication. May also take flonase. May add coricidin for decongestant.   2. Left hip pain  Likely trochanteric bursitis, will schedule injection with Tawanna Sat, her PCP.  Return for hip injection .   The entirety of the information documented in the History of Present Illness, Review of Systems and Physical Exam were personally obtained by me. Portions of this information were initially documented by Ashley Royalty, CMA and reviewed by me for thoroughness and accuracy.

## 2017-12-05 ENCOUNTER — Ambulatory Visit: Payer: Self-pay | Admitting: Family Medicine

## 2017-12-08 ENCOUNTER — Encounter: Payer: Self-pay | Admitting: Physician Assistant

## 2017-12-08 ENCOUNTER — Ambulatory Visit (INDEPENDENT_AMBULATORY_CARE_PROVIDER_SITE_OTHER): Payer: PPO | Admitting: Physician Assistant

## 2017-12-08 VITALS — BP 130/80 | HR 84 | Temp 97.8°F | Resp 16 | Ht 62.0 in | Wt 141.0 lb

## 2017-12-08 DIAGNOSIS — M7062 Trochanteric bursitis, left hip: Secondary | ICD-10-CM

## 2017-12-08 MED ORDER — METHYLPREDNISOLONE ACETATE 80 MG/ML IJ SUSP
80.0000 mg | Freq: Once | INTRAMUSCULAR | Status: AC
Start: 2017-12-08 — End: 2017-12-08
  Administered 2017-12-08: 80 mg via INTRA_ARTICULAR

## 2017-12-08 NOTE — Progress Notes (Signed)
Patient: Sarah Todd Female    DOB: [birth year]   72 y.o.   MRN: [id number] Visit Date: 12/08/2017  Today's Provider: Mar Daring, PA-C   Chief Complaint  Patient presents with  . Hip Pain   Subjective:    HPI Patient here today C/O left hip pain ongoing for several months. Patient reports pain is worse when sitting or lying on her left hip. Patient denies any weakness, denies falls or injuries.     Allergies  Allergen Reactions  . Lisinopril Anaphylaxis  . Shrimp [Shellfish Allergy] Anaphylaxis  . Aspercreme [Trolamine Salicylate] Rash     Current Outpatient Medications:  .  acetaminophen (TYLENOL) 650 MG CR tablet, Take 650 mg by mouth every 4 (four) hours as needed for pain., Disp: , Rfl:  .  aspirin 81 MG tablet, Take 81 mg by mouth daily. 1/2 tab po qd, Disp: , Rfl:  .  Calcium Citrate 200 MG TABS, Take 1 tablet by mouth daily. , Disp: , Rfl:  .  Cholecalciferol (VITAMIN D-3 PO), Take 1,000 Units by mouth daily. AM, Disp: , Rfl:  .  estradiol (ESTRACE) 1 MG tablet, TAKE ONE TABLET BY MOUTH EVERY DAY, Disp: 30 tablet, Rfl: 5 .  gabapentin (NEURONTIN) 600 MG tablet, Take 600 mg by mouth 2 (two) times daily. , Disp: , Rfl:  .  losartan (COZAAR) 50 MG tablet, TAKE ONE TABLET BY MOUTH EVERY MORNING, Disp: 30 tablet, Rfl: 5 .  Multiple Vitamin (MULTIVITAMIN) capsule, Take 1 capsule by mouth daily. AM, Disp: , Rfl:  .  Omega 3 1000 MG CAPS, Take 1,000 mg by mouth daily. AM, Disp: , Rfl:  .  oxybutynin (DITROPAN-XL) 5 MG 24 hr tablet, Take 1 tablet (5 mg total) by mouth at bedtime., Disp: 90 tablet, Rfl: 0 .  vitamin C (ASCORBIC ACID) 500 MG tablet, Take by mouth., Disp: , Rfl:   Review of Systems  Constitutional: Negative.   Respiratory: Negative.   Cardiovascular: Negative.   Musculoskeletal: Positive for arthralgias (left hip pain).    Social History   Tobacco Use  . Smoking status: Never Smoker  . Smokeless tobacco: Never Used  Substance Use  Topics  . Alcohol use: No    Alcohol/week: 0.6 oz    Types: 1 Glasses of wine per week   Objective:   BP 130/80 (BP Location: Left Arm, Patient Position: Sitting, Cuff Size: Normal)   Pulse 84   Temp 97.8 F (36.6 C) (Oral)   Resp 16   Ht 5\' 2"  (1.575 m)   Wt 141 lb (64 kg)   SpO2 99%   BMI 25.79 kg/m  Vitals:   12/08/17 1440  BP: 130/80  Pulse: 84  Resp: 16  Temp: 97.8 F (36.6 C)  TempSrc: Oral  SpO2: 99%  Weight: 141 lb (64 kg)  Height: 5\' 2"  (1.575 m)     Physical Exam  Constitutional: She appears well-developed and well-nourished.  Musculoskeletal:       Right hip: Normal.       Left hip: She exhibits tenderness and bony tenderness (point tender over left greater trochanter). She exhibits normal range of motion, normal strength, no swelling, no crepitus, no deformity and no laceration.  Vitals reviewed.      Assessment & Plan:     1. Trochanteric bursitis of left hip Left hip steroid injection given, see procedure note below, and tolerated well. Information given for after care instructions. She is to call  if no improvement.  - methylPREDNISolone acetate (DEPO-MEDROL) injection 80 mg  Procedure Note: Benefits, risks (including infection, tattooing, adipose dimpling, and tendon rupture) and alternatives were explained to the patient. All questions were sought and answered.  Patient agreed to continue and verbal consent was obtained.   An aspiration and steroid injection was performed on left hip using 4cc of 1% plain Xyloocaine and 80 mg of depo-medrol. There was no bleeding. Hemostasis was intact. A dry dressing was applied. The procedure was well tolerated.       Mar Daring, PA-C  [facility]

## 2017-12-08 NOTE — Patient Instructions (Signed)
Hip Injection, Care After Refer to this sheet in the next few weeks. These instructions provide you with information about caring for yourself after your procedure. Your health care provider may also give you more specific instructions. Your treatment has been planned according to current medical practices, but problems sometimes occur. Call your health care provider if you have any problems or questions after your procedure. What can I expect after the procedure? After the procedure, it is common to have:  Soreness.  Warmth.  Swelling.  You may have more pain, swelling, and warmth than you did before the injection. This reaction may last for about one day. Follow these instructions at home: Bathing  If you were given a bandage (dressing), keep it dry until your health care provider says it can be removed. Ask your health care provider when you can start showering or taking a bath. (12-24 hours can be removed) Managing pain, stiffness, and swelling  If directed, apply ice to the injection area: ? Put ice in a plastic bag. ? Place a towel between your skin and the bag. ? Leave the ice on for 20 minutes, 2-3 times per day.  Do not apply heat to your hip.  Raise the injection area above the level of your heart while you are sitting or lying down. Activity  Avoid strenuous activities for as long as directed by your health care provider. Ask your health care provider when you can return to your normal activities. General instructions  Take medicines only as directed by your health care provider.  Do not take aspirin or other over-the-counter medicines unless your health care provider says you can.  Check your injection site every day for signs of infection. Watch for: ? Redness, swelling, or pain. ? Fluid, blood, or pus.  Follow your health care provider's instructions about dressing changes and removal. Contact a health care provider if:  You have symptoms at your injection site  that last longer than two days after your procedure.  You have redness, swelling, or pain in your injection area.  You have fluid, blood, or pus coming from your injection site.  You have warmth in your injection area.  You have a fever.  Your pain is not controlled with medicine. Get help right away if:  Your hip turns very red.  Your hip becomes very swollen.  Your hip pain is severe. This information is not intended to replace advice given to you by your health care provider. Make sure you discuss any questions you have with your health care provider. Document Released: 09/09/2014 Document Revised: 04/24/2016 Document Reviewed: 06/29/2014 Elsevier Interactive Patient Education  2018 [place].  Hip Bursitis Hip bursitis is inflammation of a fluid-filled sac (bursa) in the hip joint. The bursa protects the bones in the hip joint from rubbing against each other. Hip bursitis can cause mild to moderate pain, and symptoms often come and go over time. What are the causes? This condition may be caused by:  Injury to the hip.  Overuse of the muscles that surround the hip joint.  Arthritis or gout.  Diabetes.  Thyroid disease.  Cold weather.  Infection.  In some cases, the cause may not be known. What are the signs or symptoms? Symptoms of this condition may include:  Mild or moderate pain in the hip area. Pain may get worse with movement.  Tenderness and swelling of the hip, especially on the outer side of the hip.  Symptoms may come and go. If the  bursa becomes infected, you may have the following symptoms:  Fever.  Red skin and a feeling of warmth in the hip area.  How is this diagnosed? This condition may be diagnosed based on:  A physical exam.  Your medical history.  X-rays.  Removal of fluid from your inflamed bursa for testing (biopsy).  You may be sent to a health care provider who specializes in bone diseases (orthopedist) or a provider who  specializes in joint inflammation (rheumatologist). How is this treated? This condition is treated by resting, raising (elevating), and applying pressure(compression) to the injured area. In some cases, this may be enough to make your symptoms go away. Treatment may also include:  Crutches.  Antibiotic medicine.  Draining fluid out of the bursa to help relieve swelling.  Injecting medicine that helps to reduce inflammation (cortisone).  Follow these instructions at home: Medicines  Take over-the-counter and prescription medicines only as told by your health care provider.  Do not drive or operate heavy machinery while taking prescription pain medicine, or as told by your health care provider.  If you were prescribed an antibiotic, take it as told by your health care provider. Do not stop taking the antibiotic even if you start to feel better. Activity  Return to your normal activities as told by your health care provider. Ask your health care provider what activities are safe for you.  Rest and protect your hip as much as possible until your pain and swelling get better. General instructions  Wear compression wraps only as told by your health care provider.  Elevate your hip above the level of your heart as much as you can without pain. To do this, try putting a pillow under your hips while you lie down.  Do not use your hip to support your body weight until your health care provider says that you can. Use crutches as told by your health care provider.  Gently massage and stretch your injured area as often as is comfortable.  Keep all follow-up visits as told by your health care provider. This is important. How is this prevented?  Exercise regularly, as told by your health care provider.  Warm up and stretch before being active.  Cool down and stretch after being active.  If an activity irritates your hip or causes pain, avoid the activity as much as possible.  Avoid  sitting down for long periods at a time. Contact a health care provider if:  You have a fever.  You develop new symptoms.  You have difficulty walking or doing everyday activities.  You have pain that gets worse or does not get better with medicine.  You develop red skin or a feeling of warmth in your hip area. Get help right away if:  You cannot move your hip.  You have severe pain. This information is not intended to replace advice given to you by your health care provider. Make sure you discuss any questions you have with your health care provider. Document Released: 02/08/2002 Document Revised: 01/25/2016 Document Reviewed: 03/21/2015 Elsevier Interactive Patient Education  [employer].

## 2017-12-11 ENCOUNTER — Other Ambulatory Visit: Payer: Self-pay | Admitting: Physician Assistant

## 2017-12-11 DIAGNOSIS — I10 Essential (primary) hypertension: Secondary | ICD-10-CM

## 2017-12-18 ENCOUNTER — Ambulatory Visit (INDEPENDENT_AMBULATORY_CARE_PROVIDER_SITE_OTHER): Payer: PPO

## 2017-12-18 ENCOUNTER — Encounter: Payer: Self-pay | Admitting: Physician Assistant

## 2017-12-18 ENCOUNTER — Other Ambulatory Visit: Payer: Self-pay | Admitting: Physician Assistant

## 2017-12-18 ENCOUNTER — Ambulatory Visit (INDEPENDENT_AMBULATORY_CARE_PROVIDER_SITE_OTHER): Payer: PPO | Admitting: Physician Assistant

## 2017-12-18 VITALS — BP 136/80 | HR 80 | Temp 97.5°F | Ht 62.0 in | Wt 138.0 lb

## 2017-12-18 DIAGNOSIS — Z131 Encounter for screening for diabetes mellitus: Secondary | ICD-10-CM | POA: Diagnosis not present

## 2017-12-18 DIAGNOSIS — Z8249 Family history of ischemic heart disease and other diseases of the circulatory system: Secondary | ICD-10-CM | POA: Diagnosis not present

## 2017-12-18 DIAGNOSIS — N952 Postmenopausal atrophic vaginitis: Secondary | ICD-10-CM

## 2017-12-18 DIAGNOSIS — Z1231 Encounter for screening mammogram for malignant neoplasm of breast: Secondary | ICD-10-CM

## 2017-12-18 DIAGNOSIS — Z136 Encounter for screening for cardiovascular disorders: Secondary | ICD-10-CM

## 2017-12-18 DIAGNOSIS — M810 Age-related osteoporosis without current pathological fracture: Secondary | ICD-10-CM

## 2017-12-18 DIAGNOSIS — Z13220 Encounter for screening for lipoid disorders: Secondary | ICD-10-CM

## 2017-12-18 DIAGNOSIS — Z1159 Encounter for screening for other viral diseases: Secondary | ICD-10-CM | POA: Diagnosis not present

## 2017-12-18 DIAGNOSIS — I10 Essential (primary) hypertension: Secondary | ICD-10-CM

## 2017-12-18 DIAGNOSIS — Z800 Family history of malignant neoplasm of digestive organs: Secondary | ICD-10-CM | POA: Diagnosis not present

## 2017-12-18 DIAGNOSIS — Z1239 Encounter for other screening for malignant neoplasm of breast: Secondary | ICD-10-CM

## 2017-12-18 DIAGNOSIS — Z0000 Encounter for general adult medical examination without abnormal findings: Secondary | ICD-10-CM

## 2017-12-18 MED ORDER — HYDROCORTISONE 2.5 % EX CREA: TOPICAL_CREAM | Freq: Two times a day (BID) | CUTANEOUS | 0 refills | Status: DC

## 2017-12-18 NOTE — Progress Notes (Addendum)
Subjective:   Sarah Todd is a 72 y.o. female who presents for Medicare Annual (Subsequent) preventive examination.  Review of Systems:  N/A  Cardiac Risk Factors include: advanced age (>39men, >76 women);dyslipidemia     Objective:     Vitals: BP 136/80 (BP Location: Left Arm)   Pulse 80   Temp (!) 97.5 F (36.4 C) (Oral)   Ht 5\' 2"  (1.575 m)   Wt 138 lb (62.6 kg)   BMI 25.24 kg/m   Body mass index is 25.24 kg/m.  Advanced Directives 12/18/2017 12/13/2016 05/17/2016 12/13/2015 02/06/2015  Does Patient Have a Medical Advance Directive? Yes Yes Yes Yes Yes  Type of Advance Directive Living will - Living will Living will Living will    Tobacco Social History   Tobacco Use  Smoking Status Never Smoker  Smokeless Tobacco Never Used     Counseling given: Not Answered   Clinical Intake:  Pre-visit preparation completed: Yes  Pain : No/denies pain Pain Score: 0-No pain     Nutritional Status: BMI 25 -29 Overweight Nutritional Risks: None Diabetes: No  How often do you need to have someone help you when you read instructions, pamphlets, or other written materials from your doctor or pharmacy?: 1 - Never  Interpreter Needed?: No  Information entered by :: [facility], LPN  Past Medical History:  Diagnosis Date  . Allergy   . Arthritis    hands  . Hypertension   . Osteoporosis   . Varicose veins    Mild   Past Surgical History:  Procedure Laterality Date  . ABDOMINAL HYSTERECTOMY    . BACK SURGERY    . COLONOSCOPY N/A 01/05/2015   Procedure: COLONOSCOPY;  Surgeon: Lucilla Lame, MD;  Location: [place];  Service: Gastroenterology;  Laterality: N/A;  . DILATION AND CURETTAGE OF UTERUS    . TUBAL LIGATION     Family History  Problem Relation Age of Onset  . Cancer Mother        colon cancer  . Alzheimer's disease Father   . Heart disease Father   . Hypertension Father   . Cancer Sister        uterine   Social History    Socioeconomic History  . Marital status: Married    Spouse name: Lashanna Angelo  . Number of children: 3  . Years of education: 53  . Highest education level: Some college, no degree  Occupational History  . Occupation: retired  Scientific laboratory technician  . Financial resource strain: Not hard at all  . Food insecurity:    Worry: Never true    Inability: Never true  . Transportation needs:    Medical: No    Non-medical: No  Tobacco Use  . Smoking status: Never Smoker  . Smokeless tobacco: Never Used  Substance and Sexual Activity  . Alcohol use: No    Alcohol/week: 0.6 oz    Types: 1 Glasses of wine per week  . Drug use: No  . Sexual activity: Yes  Lifestyle  . Physical activity:    Days per week: Not on file    Minutes per session: Not on file  . Stress: Not at all  Relationships  . Social connections:    Talks on phone: Not on file    Gets together: Not on file    Attends religious service: Not on file    Active member of club or organization: Not on file    Attends meetings of clubs or organizations:  Not on file    Relationship status: Not on file  Other Topics Concern  . Not on file  Social History Narrative  . Not on file    Outpatient Encounter Medications as of 12/18/2017  Medication Sig  . acetaminophen (TYLENOL) 650 MG CR tablet Take 650 mg by mouth every 4 (four) hours as needed for pain.  Marland Kitchen aspirin 81 MG tablet Take 81 mg by mouth daily. 1/2 tab po qd  . Calcium Citrate 200 MG TABS Take 1 tablet by mouth daily.   . Cholecalciferol (VITAMIN D-3 PO) Take 1,000 Units by mouth daily. AM  . estradiol (ESTRACE) 1 MG tablet TAKE ONE TABLET BY MOUTH EVERY DAY  . gabapentin (NEURONTIN) 600 MG tablet Take 600 mg by mouth 2 (two) times daily.   Marland Kitchen losartan (COZAAR) 50 MG tablet TAKE ONE TABLET BY MOUTH EVERY MORNING  . Multiple Vitamin (MULTIVITAMIN) capsule Take 1 capsule by mouth daily. AM  . Omega 3 1000 MG CAPS Take 1,000 mg by mouth daily. AM  . oxybutynin  (DITROPAN-XL) 5 MG 24 hr tablet Take 1 tablet (5 mg total) by mouth at bedtime.  . vitamin C (ASCORBIC ACID) 500 MG tablet Take by mouth.   No facility-administered encounter medications on file as of 12/18/2017.     Activities of Daily Living In your present state of health, do you have any difficulty performing the following activities: 12/18/2017  Hearing? N  Vision? N  Difficulty concentrating or making decisions? Y  Walking or climbing stairs? N  Dressing or bathing? N  Doing errands, shopping? N  Preparing Food and eating ? N  Using the Toilet? N  In the past six months, have you accidently leaked urine? N  Do you have problems with loss of bowel control? N  Managing your Medications? N  Managing your Finances? N  Housekeeping or managing your Housekeeping? N  Some recent data might be hidden    Patient Care Team: Mar Daring, PA-C as PCP - General (Family Medicine) Arelia Sneddon, [place] as Consulting Physician (Optometry) Jovita Gamma, MD as Consulting Physician (Neurosurgery)    Assessment:   This is a routine wellness examination for Sarah Todd.  Exercise Activities and Dietary recommendations Current Exercise Habits: The patient does not participate in regular exercise at present, Exercise limited by: None identified  Goals    . DIET - INCREASE WATER INTAKE     Recommend increasing water intake to 6 glasses a day.        Fall Risk Fall Risk  12/18/2017 12/13/2016 12/13/2015 02/06/2015  Falls in the past year? No No No No   Is the patient's home free of loose throw rugs in walkways, pet beds, electrical cords, etc?   yes      Grab bars in the bathroom? no      Handrails on the stairs?   yes      Adequate lighting?   yes  Timed Get Up and Go performed: N/A  Depression Screen PHQ 2/9 Scores 12/18/2017 12/18/2017 12/13/2016 12/13/2015  PHQ - 2 Score 0 0 0 0  PHQ- 9 Score 4 - 2 -     Cognitive Function     6CIT Screen 12/18/2017  What Year? 0 points    What month? 0 points  What time? 0 points  Count back from 20 0 points  Months in reverse 0 points  Repeat phrase 0 points  Total Score 0    Immunization History  Administered Date(s) Administered  .  Influenza,inj,Quad PF,6+ Mos 06/15/2015  . Influenza-Unspecified 05/13/2017  . Pneumococcal Conjugate-13 11/17/2014  . Pneumococcal Polysaccharide-23 08/03/2013    Qualifies for Shingles Vaccine? Due for Shingles vaccine. Declined my offer to administer today. Education has been provided regarding the importance of this vaccine. Pt has been advised to call her insurance company to determine her out of pocket expense. Advised she may also receive this vaccine at her local pharmacy or Health Dept. Verbalized acceptance and understanding.  Screening Tests Health Maintenance  Topic Date Due  . Hepatitis C Screening  [birth year]  . TETANUS/TDAP  12/29/1962  . INFLUENZA VACCINE  04/02/2018  . MAMMOGRAM  01/11/2019  . COLONOSCOPY  01/04/2025  . DEXA SCAN  Completed  . PNA vac Low Risk Adult  Completed    Cancer Screenings: Lung: Low Dose CT Chest recommended if Age 33-80 years, 30 pack-year currently smoking OR have quit w/in 15years. Patient does not qualify. Breast:  Up to date on Mammogram? Yes   Up to date of Bone Density/Dexa? Yes Colorectal: Up to date  Additional Screenings:  Hepatitis C Screening: Pt declines today.      Plan:  I have personally reviewed and addressed the Medicare Annual Wellness questionnaire and have noted the following in the patient's chart:  A. Medical and social history B. Use of alcohol, tobacco or illicit drugs  C. Current medications and supplements D. Functional ability and status E.  Nutritional status F.  Physical activity G. Advance directives H. List of other physicians I.  Hospitalizations, surgeries, and ER visits in previous 12 months J.  [place] such as hearing and vision if needed, cognitive and  depression L. Referrals and appointments - none  In addition, I have reviewed and discussed with patient certain preventive protocols, quality metrics, and best practice recommendations. A written personalized care plan for preventive services as well as general preventive health recommendations were provided to patient.  See attached scanned questionnaire for additional information.   Signed,  Fabio Neighbors, LPN Nurse Health Advisor   Nurse Recommendations: Pt declined the tetanus vaccine and Hep C lab today. Pt states she has had a tetanus vaccine at the hospital in the last year or two. Request records to be scanned into chart.

## 2017-12-18 NOTE — Patient Instructions (Signed)
Health Maintenance for Postmenopausal Women Menopause is a normal process in which your reproductive ability comes to an end. This process happens gradually over a span of months to years, usually between the ages of 37 and 105. Menopause is complete when you have missed 12 consecutive menstrual periods. It is important to talk with your health care provider about some of the most common conditions that affect postmenopausal women, such as heart disease, cancer, and bone loss (osteoporosis). Adopting a healthy lifestyle and getting preventive care can help to promote your health and wellness. Those actions can also lower your chances of developing some of these common conditions. What should I know about menopause? During menopause, you may experience a number of symptoms, such as:  Moderate-to-severe hot flashes.  Night sweats.  Decrease in sex drive.  Mood swings.  Headaches.  Tiredness.  Irritability.  Memory problems.  Insomnia.  Choosing to treat or not to treat menopausal changes is an individual decision that you make with your health care provider. What should I know about hormone replacement therapy and supplements? Hormone therapy products are effective for treating symptoms that are associated with menopause, such as hot flashes and night sweats. Hormone replacement carries certain risks, especially as you become older. If you are thinking about using estrogen or estrogen with progestin treatments, discuss the benefits and risks with your health care provider. What should I know about heart disease and stroke? Heart disease, heart attack, and stroke become more likely as you age. This may be due, in part, to the hormonal changes that your body experiences during menopause. These can affect how your body processes dietary fats, triglycerides, and cholesterol. Heart attack and stroke are both medical emergencies. There are many things that you can do to help prevent heart disease  and stroke:  Have your blood pressure checked at least every 1-2 years. High blood pressure causes heart disease and increases the risk of stroke.  If you are 19-58 years old, ask your health care provider if you should take aspirin to prevent a heart attack or a stroke.  Do not use any tobacco products, including cigarettes, chewing tobacco, or electronic cigarettes. If you need help quitting, ask your health care provider.  It is important to eat a healthy diet and maintain a healthy weight. ? Be sure to include plenty of vegetables, fruits, low-fat dairy products, and lean protein. ? Avoid eating foods that are high in solid fats, added sugars, or salt (sodium).  Get regular exercise. This is one of the most important things that you can do for your health. ? Try to exercise for at least 150 minutes each week. The type of exercise that you do should increase your heart rate and make you sweat. This is known as moderate-intensity exercise. ? Try to do strengthening exercises at least twice each week. Do these in addition to the moderate-intensity exercise.  Know your numbers.Ask your health care provider to check your cholesterol and your blood glucose. Continue to have your blood tested as directed by your health care provider.  What should I know about cancer screening? There are several types of cancer. Take the following steps to reduce your risk and to catch any cancer development as early as possible. Breast Cancer  Practice breast self-awareness. ? This means understanding how your breasts normally appear and feel. ? It also means doing regular breast self-exams. Let your health care provider know about any changes, no matter how small.  If you are 40  or older, have a clinician do a breast exam (clinical breast exam or CBE) every year. Depending on your age, family history, and medical history, it may be recommended that you also have a yearly breast X-ray (mammogram).  If you  have a family history of breast cancer, talk with your health care provider about genetic screening.  If you are at high risk for breast cancer, talk with your health care provider about having an MRI and a mammogram every year.  Breast cancer (BRCA) gene test is recommended for women who have family members with BRCA-related cancers. Results of the assessment will determine the need for genetic counseling and BRCA1 and for BRCA2 testing. BRCA-related cancers include these types: ? Breast. This occurs in males or females. ? Ovarian. ? Tubal. This may also be called fallopian tube cancer. ? Cancer of the abdominal or pelvic lining (peritoneal cancer). ? Prostate. ? Pancreatic.  Cervical, Uterine, and Ovarian Cancer Your health care provider may recommend that you be screened regularly for cancer of the pelvic organs. These include your ovaries, uterus, and vagina. This screening involves a pelvic exam, which includes checking for microscopic changes to the surface of your cervix (Pap test).  For women ages 21-65, health care providers may recommend a pelvic exam and a Pap test every three years. For women ages 49-65, they may recommend the Pap test and pelvic exam, combined with testing for human papilloma virus (HPV), every five years. Some types of HPV increase your risk of cervical cancer. Testing for HPV may also be done on women of any age who have unclear Pap test results.  Other health care providers may not recommend any screening for nonpregnant women who are considered low risk for pelvic cancer and have no symptoms. Ask your health care provider if a screening pelvic exam is right for you.  If you have had past treatment for cervical cancer or a condition that could lead to cancer, you need Pap tests and screening for cancer for at least 20 years after your treatment. If Pap tests have been discontinued for you, your risk factors (such as having a new sexual partner) need to be  reassessed to determine if you should start having screenings again. Some women have medical problems that increase the chance of getting cervical cancer. In these cases, your health care provider may recommend that you have screening and Pap tests more often.  If you have a family history of uterine cancer or ovarian cancer, talk with your health care provider about genetic screening.  If you have vaginal bleeding after reaching menopause, tell your health care provider.  There are currently no reliable tests available to screen for ovarian cancer.  Lung Cancer Lung cancer screening is recommended for adults 29-50 years old who are at high risk for lung cancer because of a history of smoking. A yearly low-dose CT scan of the lungs is recommended if you:  Currently smoke.  Have a history of at least 30 pack-years of smoking and you currently smoke or have quit within the past 15 years. A pack-year is smoking an average of one pack of cigarettes per day for one year.  Yearly screening should:  Continue until it has been 15 years since you quit.  Stop if you develop a health problem that would prevent you from having lung cancer treatment.  Colorectal Cancer  This type of cancer can be detected and can often be prevented.  Routine colorectal cancer screening usually begins at  age 42 and continues through age 45.  If you have risk factors for colon cancer, your health care provider may recommend that you be screened at an earlier age.  If you have a family history of colorectal cancer, talk with your health care provider about genetic screening.  Your health care provider may also recommend using home test kits to check for hidden blood in your stool.  A small camera at the end of a tube can be used to examine your colon directly (sigmoidoscopy or colonoscopy). This is done to check for the earliest forms of colorectal cancer.  Direct examination of the colon should be repeated every  5-10 years until age 71. However, if early forms of precancerous polyps or small growths are found or if you have a family history or genetic risk for colorectal cancer, you may need to be screened more often.  Skin Cancer  Check your skin from head to toe regularly.  Monitor any moles. Be sure to tell your health care provider: ? About any new moles or changes in moles, especially if there is a change in a mole's shape or color. ? If you have a mole that is larger than the size of a pencil eraser.  If any of your family members has a history of skin cancer, especially at a young age, talk with your health care provider about genetic screening.  Always use sunscreen. Apply sunscreen liberally and repeatedly throughout the day.  Whenever you are outside, protect yourself by wearing long sleeves, pants, a wide-brimmed hat, and sunglasses.  What should I know about osteoporosis? Osteoporosis is a condition in which bone destruction happens more quickly than new bone creation. After menopause, you may be at an increased risk for osteoporosis. To help prevent osteoporosis or the bone fractures that can happen because of osteoporosis, the following is recommended:  If you are 46-71 years old, get at least 1,000 mg of calcium and at least 600 mg of vitamin D per day.  If you are older than age 55 but younger than age 65, get at least 1,200 mg of calcium and at least 600 mg of vitamin D per day.  If you are older than age 54, get at least 1,200 mg of calcium and at least 800 mg of vitamin D per day.  Smoking and excessive alcohol intake increase the risk of osteoporosis. Eat foods that are rich in calcium and vitamin D, and do weight-bearing exercises several times each week as directed by your health care provider. What should I know about how menopause affects my mental health? Depression may occur at any age, but it is more common as you become older. Common symptoms of depression  include:  Low or sad mood.  Changes in sleep patterns.  Changes in appetite or eating patterns.  Feeling an overall lack of motivation or enjoyment of activities that you previously enjoyed.  Frequent crying spells.  Talk with your health care provider if you think that you are experiencing depression. What should I know about immunizations? It is important that you get and maintain your immunizations. These include:  Tetanus, diphtheria, and pertussis (Tdap) booster vaccine.  Influenza every year before the flu season begins.  Pneumonia vaccine.  Shingles vaccine.  Your health care provider may also recommend other immunizations. This information is not intended to replace advice given to you by your health care provider. Make sure you discuss any questions you have with your health care provider. Document Released: 10/11/2005  Document Revised: 03/08/2016 Document Reviewed: 05/23/2015 Elsevier Interactive Patient Education  [employer].

## 2017-12-18 NOTE — Patient Instructions (Signed)
Sarah Todd , Thank you for taking time to come for your Medicare Wellness Visit. I appreciate your ongoing commitment to your health goals. Please review the following plan we discussed and let me know if I can assist you in the future.   Screening recommendations/referrals: Colonoscopy: Up to date Mammogram: Up to date Bone Density: Up to date Recommended yearly ophthalmology/optometry visit for glaucoma screening and checkup Recommended yearly dental visit for hygiene and checkup  Vaccinations: Influenza vaccine: Up to date Pneumococcal vaccine: Up to date Tdap vaccine: Pt declines today.  Shingles vaccine: Pt declines today.     Advanced directives: Please bring a copy of your POA (Power of Attorney) and/or Living Will to your next appointment.   Conditions/risks identified: Recommend increasing water intake to 6 glasses a day.   Next appointment: 2:20 PM today with Fenton Malling.   Preventive Care 57 Years and Older, Female Preventive care refers to lifestyle choices and visits with your health care provider that can promote health and wellness. What does preventive care include?  A yearly physical exam. This is also called an annual well check.  Dental exams once or twice a year.  Routine eye exams. Ask your health care provider how often you should have your eyes checked.  Personal lifestyle choices, including:  Daily care of your teeth and gums.  Regular physical activity.  Eating a healthy diet.  Avoiding tobacco and drug use.  Limiting alcohol use.  Practicing safe sex.  Taking low-dose aspirin every day.  Taking vitamin and mineral supplements as recommended by your health care provider. What happens during an annual well check? The services and screenings done by your health care provider during your annual well check will depend on your age, overall health, lifestyle risk factors, and family history of disease. Counseling  Your health care  provider may ask you questions about your:  Alcohol use.  Tobacco use.  Drug use.  Emotional well-being.  Home and relationship well-being.  Sexual activity.  Eating habits.  History of falls.  Memory and ability to understand (cognition).  Work and work Statistician.  Reproductive health. Screening  You may have the following tests or measurements:  Height, weight, and BMI.  Blood pressure.  Lipid and cholesterol levels. These may be checked every 5 years, or more frequently if you are over 81 years old.  Skin check.  Lung cancer screening. You may have this screening every year starting at age 23 if you have a 30-pack-year history of smoking and currently smoke or have quit within the past 15 years.  Fecal occult blood test (FOBT) of the stool. You may have this test every year starting at age 51.  Flexible sigmoidoscopy or colonoscopy. You may have a sigmoidoscopy every 5 years or a colonoscopy every 10 years starting at age 43.  Hepatitis C blood test.  Hepatitis B blood test.  Sexually transmitted disease (STD) testing.  Diabetes screening. This is done by checking your blood sugar (glucose) after you have not eaten for a while (fasting). You may have this done every 1-3 years.  Bone density scan. This is done to screen for osteoporosis. You may have this done starting at age 30.  Mammogram. This may be done every 1-2 years. Talk to your health care provider about how often you should have regular mammograms. Talk with your health care provider about your test results, treatment options, and if necessary, the need for more tests. Vaccines  Your health care provider may recommend  certain vaccines, such as:  Influenza vaccine. This is recommended every year.  Tetanus, diphtheria, and acellular pertussis (Tdap, Td) vaccine. You may need a Td booster every 10 years.  Zoster vaccine. You may need this after age 23.  Pneumococcal 13-valent conjugate (PCV13)  vaccine. One dose is recommended after age 70.  Pneumococcal polysaccharide (PPSV23) vaccine. One dose is recommended after age 30. Talk to your health care provider about which screenings and vaccines you need and how often you need them. This information is not intended to replace advice given to you by your health care provider. Make sure you discuss any questions you have with your health care provider. Document Released: 09/15/2015 Document Revised: 05/08/2016 Document Reviewed: 06/20/2015 Elsevier Interactive Patient Education  2017 [place] Prevention in the Home Falls can cause injuries. They can happen to people of all ages. There are many things you can do to make your home safe and to help prevent falls. What can I do on the outside of my home?  Regularly fix the edges of walkways and driveways and fix any cracks.  Remove anything that might make you trip as you walk through a door, such as a raised step or threshold.  Trim any bushes or trees on the path to your home.  Use bright outdoor lighting.  Clear any walking paths of anything that might make someone trip, such as rocks or tools.  Regularly check to see if handrails are loose or broken. Make sure that both sides of any steps have handrails.  Any raised decks and porches should have guardrails on the edges.  Have any leaves, snow, or ice cleared regularly.  Use sand or salt on walking paths during winter.  Clean up any spills in your garage right away. This includes oil or grease spills. What can I do in the bathroom?  Use night lights.  Install grab bars by the toilet and in the tub and shower. Do not use towel bars as grab bars.  Use non-skid mats or decals in the tub or shower.  If you need to sit down in the shower, use a plastic, non-slip stool.  Keep the floor dry. Clean up any water that spills on the floor as soon as it happens.  Remove soap buildup in the tub or shower  regularly.  Attach bath mats securely with double-sided non-slip rug tape.  Do not have throw rugs and other things on the floor that can make you trip. What can I do in the bedroom?  Use night lights.  Make sure that you have a light by your bed that is easy to reach.  Do not use any sheets or blankets that are too big for your bed. They should not hang down onto the floor.  Have a firm chair that has side arms. You can use this for support while you get dressed.  Do not have throw rugs and other things on the floor that can make you trip. What can I do in the kitchen?  Clean up any spills right away.  Avoid walking on wet floors.  Keep items that you use a lot in easy-to-reach places.  If you need to reach something above you, use a strong step stool that has a grab bar.  Keep electrical cords out of the way.  Do not use floor polish or wax that makes floors slippery. If you must use wax, use non-skid floor wax.  Do not have throw rugs and other  things on the floor that can make you trip. What can I do with my stairs?  Do not leave any items on the stairs.  Make sure that there are handrails on both sides of the stairs and use them. Fix handrails that are broken or loose. Make sure that handrails are as long as the stairways.  Check any carpeting to make sure that it is firmly attached to the stairs. Fix any carpet that is loose or worn.  Avoid having throw rugs at the top or bottom of the stairs. If you do have throw rugs, attach them to the floor with carpet tape.  Make sure that you have a light switch at the top of the stairs and the bottom of the stairs. If you do not have them, ask someone to add them for you. What else can I do to help prevent falls?  Wear shoes that:  Do not have high heels.  Have rubber bottoms.  Are comfortable and fit you well.  Are closed at the toe. Do not wear sandals.  If you use a stepladder:  Make sure that it is fully  opened. Do not climb a closed stepladder.  Make sure that both sides of the stepladder are locked into place.  Ask someone to hold it for you, if possible.  Clearly mark and make sure that you can see:  Any grab bars or handrails.  First and last steps.  Where the edge of each step is.  Use tools that help you move around (mobility aids) if they are needed. These include:  Canes.  Walkers.  Scooters.  Crutches.  Turn on the lights when you go into a dark area. Replace any light bulbs as soon as they burn out.  Set up your furniture so you have a clear path. Avoid moving your furniture around.  If any of your floors are uneven, fix them.  If there are any pets around you, be aware of where they are.  Review your medicines with your doctor. Some medicines can make you feel dizzy. This can increase your chance of falling. Ask your doctor what other things that you can do to help prevent falls. This information is not intended to replace advice given to you by your health care provider. Make sure you discuss any questions you have with your health care provider. Document Released: 06/15/2009 Document Revised: 01/25/2016 Document Reviewed: 09/23/2014 Elsevier Interactive Patient Education  2017 [employer].

## 2017-12-18 NOTE — Progress Notes (Signed)
Patient: Sarah Todd, Female    DOB: [birth year], 72 y.o.   MRN: [id number] Visit Date: 12/18/2017  Today's Provider: Mar Daring, PA-C   Chief Complaint  Patient presents with  . Annual Exam  . Hypertension   Subjective:    Annual wellness visit Aurore Redinger is a 72 y.o. female. She feels well. She reports exercising occasionally. She reports she is sleeping well.  Mammogram- 12/31/2016. Normal. Colonoscopy- 01/05/2015. Repeat in 5 years. BMD- Normal. Repeat in 2 years.    Hypertension, follow-up:  BP Readings from Last 3 Encounters:  12/18/17 136/80  12/08/17 130/80  12/01/17 (!) 144/76    She was last seen for hypertension 1 years ago.  BP at that visit was 120/80. Management since that visit includes no changes. She reports good compliance with treatment. She is not having side effects.  She is exercising. She is adherent to low salt diet.   Outside blood pressures are checked occasionally. She is experiencing none.  Patient denies exertional chest pressure/discomfort, lower extremity edema and palpitations.     Weight trend: stable Wt Readings from Last 3 Encounters:  12/18/17 138 lb (62.6 kg)  12/08/17 141 lb (64 kg)  12/01/17 142 lb (64.4 kg)    Current diet: well balanced  Review of Systems  Constitutional: Negative.   HENT: Negative.   Eyes: Negative.   Respiratory: Negative.   Cardiovascular: Negative.   Gastrointestinal: Positive for constipation.  Endocrine: Negative.   Genitourinary: Negative.   Musculoskeletal: Negative.   Skin: Negative.   Allergic/Immunologic: Negative.   Neurological: Negative.   Hematological: Negative.   Psychiatric/Behavioral: Negative.     Social History   Socioeconomic History  . Marital status: Married    Spouse name: Joyell Emami  . Number of children: 3  . Years of education: 69  . Highest education level: Some college, no degree  Occupational History  . Occupation:  retired  Scientific laboratory technician  . Financial resource strain: Not hard at all  . Food insecurity:    Worry: Never true    Inability: Never true  . Transportation needs:    Medical: No    Non-medical: No  Tobacco Use  . Smoking status: Never Smoker  . Smokeless tobacco: Never Used  Substance and Sexual Activity  . Alcohol use: No    Alcohol/week: 0.6 oz    Types: 1 Glasses of wine per week  . Drug use: No  . Sexual activity: Yes  Lifestyle  . Physical activity:    Days per week: Not on file    Minutes per session: Not on file  . Stress: Not at all  Relationships  . Social connections:    Talks on phone: Not on file    Gets together: Not on file    Attends religious service: Not on file    Active member of club or organization: Not on file    Attends meetings of clubs or organizations: Not on file    Relationship status: Not on file  . Intimate partner violence:    Fear of current or ex partner: Not on file    Emotionally abused: Not on file    Physically abused: Not on file    Forced sexual activity: Not on file  Other Topics Concern  . Not on file  Social History Narrative  . Not on file    Past Medical History:  Diagnosis Date  . Allergy   . Arthritis  hands  . Hypertension   . Osteoporosis   . Varicose veins    Mild     Patient Active Problem List   Diagnosis Date Noted  . Pain in elbow 11/07/2016  . Candidiasis of breast 07/20/2015  . Atrophic vaginitis 07/20/2015  . Constipation 06/20/2015  . Skin lesion of face 06/20/2015  . Tinea corporis 05/19/2015  . Allergic rhinitis 02/06/2015  . Arthritis 02/06/2015  . BP (high blood pressure) 02/06/2015  . OP (osteoporosis) 02/06/2015  . Shoulder strain 02/06/2015  . Dermatophytosis of groin 02/06/2015  . Candida vaginitis 02/06/2015    Past Surgical History:  Procedure Laterality Date  . ABDOMINAL HYSTERECTOMY    . BACK SURGERY    . COLONOSCOPY N/A 01/05/2015   Procedure: COLONOSCOPY;  Surgeon: Lucilla Lame, MD;  Location: [facility];  Service: Gastroenterology;  Laterality: N/A;  . DILATION AND CURETTAGE OF UTERUS    . TUBAL LIGATION      Her family history includes Alzheimer's disease in her father; Cancer in her mother and sister; Heart disease in her father; Hypertension in her father.      Current Outpatient Medications:  .  acetaminophen (TYLENOL) 650 MG CR tablet, Take 650 mg by mouth every 4 (four) hours as needed for pain., Disp: , Rfl:  .  aspirin 81 MG tablet, Take 81 mg by mouth daily. 1/2 tab po qd, Disp: , Rfl:  .  Calcium Citrate 200 MG TABS, Take 1 tablet by mouth daily. , Disp: , Rfl:  .  Cholecalciferol (VITAMIN D-3 PO), Take 1,000 Units by mouth daily. AM, Disp: , Rfl:  .  estradiol (ESTRACE) 1 MG tablet, TAKE ONE TABLET BY MOUTH EVERY DAY, Disp: 30 tablet, Rfl: 5 .  gabapentin (NEURONTIN) 600 MG tablet, Take 600 mg by mouth 2 (two) times daily. , Disp: , Rfl:  .  losartan (COZAAR) 50 MG tablet, TAKE ONE TABLET BY MOUTH EVERY MORNING, Disp: 30 tablet, Rfl: 5 .  Multiple Vitamin (MULTIVITAMIN) capsule, Take 1 capsule by mouth daily. AM, Disp: , Rfl:  .  Omega 3 1000 MG CAPS, Take 1,000 mg by mouth daily. AM, Disp: , Rfl:  .  oxybutynin (DITROPAN-XL) 5 MG 24 hr tablet, Take 1 tablet (5 mg total) by mouth at bedtime., Disp: 90 tablet, Rfl: 0 .  vitamin C (ASCORBIC ACID) 500 MG tablet, Take by mouth., Disp: , Rfl:   Patient Care Team: Mar Daring, PA-C as PCP - General (Family Medicine) Arelia Sneddon, OD as Consulting Physician (Optometry) Jovita Gamma, MD as Consulting Physician (Neurosurgery)     Objective:    Vitals: BP 136/80 (BP Location: Left Arm)    Pulse 80    Temp 97.5 F (36.4 C) (Oral)    Ht 5\' 2"  (1.575 m)    Wt 138 lb (62.6 kg)    BMI 25.24 kg/m    BSA 1.65 m  Physical Exam  Constitutional: She is oriented to person, place, and time. She appears well-developed and well-nourished. No distress.  HENT:  Head:  Normocephalic and atraumatic.  Right Ear: Hearing, tympanic membrane, external ear and ear canal normal.  Left Ear: Hearing, tympanic membrane, external ear and ear canal normal.  Nose: Nose normal.  Mouth/Throat: Uvula is midline, oropharynx is clear and moist and mucous membranes are normal. No oropharyngeal exudate.  Eyes: Pupils are equal, round, and reactive to light. Conjunctivae and EOM are normal. Right eye exhibits no discharge. Left eye exhibits no discharge. No scleral icterus.  Neck: Normal range of motion. Neck supple. No JVD present. Carotid bruit is not present. No tracheal deviation present. No thyromegaly present.  Cardiovascular: Normal rate, regular rhythm, normal heart sounds and intact distal pulses. Exam reveals no gallop and no friction rub.  No murmur heard. Pulmonary/Chest: Effort normal and breath sounds normal. No respiratory distress. She has no wheezes. She has no rales. She exhibits no tenderness. Right breast exhibits no inverted nipple, no mass, no nipple discharge, no skin change and no tenderness. Left breast exhibits no inverted nipple, no mass, no nipple discharge, no skin change and no tenderness. No breast swelling, tenderness, discharge or bleeding. Breasts are symmetrical.  Abdominal: Soft. Bowel sounds are normal. She exhibits no distension and no mass. There is no tenderness. There is no rebound and no guarding.  Musculoskeletal: Normal range of motion. She exhibits no edema or tenderness.  Lymphadenopathy:    She has no cervical adenopathy.  Neurological: She is alert and oriented to person, place, and time.  Skin: Skin is warm and dry. No rash noted. She is not diaphoretic.  Psychiatric: She has a normal mood and affect. Her behavior is normal. Judgment and thought content normal.  Vitals reviewed.   Activities of Daily Living In your present state of health, do you have any difficulty performing the following activities: 12/18/2017  Hearing? N    Vision? N  Difficulty concentrating or making decisions? Y  Walking or climbing stairs? N  Dressing or bathing? N  Doing errands, shopping? N  Preparing Food and eating ? N  Using the Toilet? N  In the past six months, have you accidently leaked urine? N  Do you have problems with loss of bowel control? N  Managing your Medications? N  Managing your Finances? N  Housekeeping or managing your Housekeeping? N  Some recent data might be hidden    Fall Risk Assessment Fall Risk  12/18/2017 12/13/2016 12/13/2015 02/06/2015  Falls in the past year? No No No No     Depression Screen PHQ 2/9 Scores 12/18/2017 12/18/2017 12/13/2016 12/13/2015  PHQ - 2 Score 0 0 0 0  PHQ- 9 Score 4 - 2 -    Cognitive Testing - 6-CIT 6CIT Screen 12/18/2017  What Year? 0 points  What month? 0 points  What time? 0 points  Count back from 20 0 points  Months in reverse 0 points  Repeat phrase 0 points  Total Score 0       Assessment & Plan:     Annual Wellness Visit  Reviewed patient's Family Medical History Reviewed and updated list of patient's medical providers Assessment of cognitive impairment was done Assessed patient's functional ability Established a written schedule for health screening [place] Completed and Reviewed  Exercise Activities and Dietary recommendations Goals    . DIET - INCREASE WATER INTAKE     Recommend increasing water intake to 6 glasses a day.        Immunization History  Administered Date(s) Administered  . Influenza,inj,Quad PF,6+ Mos 06/15/2015  . Influenza-Unspecified 05/13/2017  . Pneumococcal Conjugate-13 11/17/2014  . Pneumococcal Polysaccharide-23 08/03/2013    Health Maintenance  Topic Date Due  . Hepatitis C Screening  1946-08-11  . TETANUS/TDAP  12/29/1962  . INFLUENZA VACCINE  04/02/2018  . MAMMOGRAM  01/11/2019  . COLONOSCOPY  01/04/2025  . DEXA SCAN  Completed  . PNA vac Low Risk Adult  Completed     Discussed  health benefits of physical activity, and encouraged  her to engage in regular exercise appropriate for her age and condition.   1. Essential hypertension Stable on Losartan 50mg . Will check labs as below and f/u pending results. - CBC w/Diff/Platelet - Comprehensive Metabolic Panel (CMET) - Lipid Profile - TSH - HgB A1c  2. Age-related osteoporosis without current pathological fracture Previously on Fosamax and completed 5 years treatment. Last BMD was in 2015 and normal. Patient declines repeat BMD this year. Will continue Vit D and calcium supplementation and weight bearing exercises. Will check labs as below and f/u pending results. - CBC w/Diff/Platelet - Comprehensive Metabolic Panel (CMET)  3. Breast cancer screening Mammogram already ordered for 12/2017.   4. Family history of colon cancer in mother Most recent colonoscopy was in 2016. To be repeated in 5 years. No complaints.   5. Family history of cardiovascular disease In father. Will check labs as below and f/u pending results. - Lipid Profile - HgB A1c  6. Encounter for lipid screening for cardiovascular disease  - Lipid Profile  7. Diabetes mellitus screening  - HgB A1c  8. Need for hepatitis C screening test  - Hepatitis C Antibody  9. Atrophic vaginitis Stable. Diagnosis pulled for medication refill. Continue current medical treatment plan. - hydrocortisone 2.5 % cream; Apply topically 2 (two) times daily.  Dispense: 30 g; Refill: 0   Mar Daring, PA-C  [facility]

## 2017-12-19 LAB — CBC WITH DIFFERENTIAL/PLATELET
Basophils Absolute: 0 10*3/uL (ref 0.0–0.2)
Basos: 0 %
EOS (ABSOLUTE): 0.1 10*3/uL (ref 0.0–0.4)
EOS: 1 %
HEMATOCRIT: 36.8 % (ref 34.0–46.6)
HEMOGLOBIN: 12.9 g/dL (ref 11.1–15.9)
IMMATURE GRANS (ABS): 0 10*3/uL (ref 0.0–0.1)
IMMATURE GRANULOCYTES: 0 %
LYMPHS ABS: 3.2 10*3/uL — AB (ref 0.7–3.1)
Lymphs: 38 %
MCH: 31.4 pg (ref 26.6–33.0)
MCHC: 35.1 g/dL (ref 31.5–35.7)
MCV: 90 fL (ref 79–97)
MONOCYTES: 5 %
Monocytes Absolute: 0.4 10*3/uL (ref 0.1–0.9)
NEUTROS PCT: 56 %
Neutrophils Absolute: 4.6 10*3/uL (ref 1.4–7.0)
Platelets: 311 10*3/uL (ref 150–379)
RBC: 4.11 x10E6/uL (ref 3.77–5.28)
RDW: 13.5 % (ref 12.3–15.4)
WBC: 8.4 10*3/uL (ref 3.4–10.8)

## 2017-12-19 LAB — TSH: TSH: 2.82 u[IU]/mL (ref 0.450–4.500)

## 2017-12-19 LAB — COMPREHENSIVE METABOLIC PANEL
ALBUMIN: 4.7 g/dL (ref 3.5–4.8)
ALT: 18 IU/L (ref 0–32)
AST: 23 IU/L (ref 0–40)
Albumin/Globulin Ratio: 1.7 (ref 1.2–2.2)
Alkaline Phosphatase: 66 IU/L (ref 39–117)
BUN / CREAT RATIO: 16 (ref 12–28)
BUN: 10 mg/dL (ref 8–27)
Bilirubin Total: 0.4 mg/dL (ref 0.0–1.2)
CALCIUM: 9.9 mg/dL (ref 8.7–10.3)
CO2: 27 mmol/L (ref 20–29)
CREATININE: 0.61 mg/dL (ref 0.57–1.00)
Chloride: 94 mmol/L — ABNORMAL LOW (ref 96–106)
GFR calc Af Amer: 104 mL/min/{1.73_m2} (ref >59)
GFR, EST NON AFRICAN AMERICAN: 90 mL/min/{1.73_m2} (ref >59)
GLOBULIN, TOTAL: 2.7 g/dL (ref 1.5–4.5)
Glucose: 94 mg/dL (ref 65–99)
Potassium: 4.1 mmol/L (ref 3.5–5.2)
SODIUM: 136 mmol/L (ref 134–144)
Total Protein: 7.4 g/dL (ref 6.0–8.5)

## 2017-12-19 LAB — LIPID PANEL
CHOL/HDL RATIO: 2.6 ratio (ref 0.0–4.4)
Cholesterol, Total: 201 mg/dL — ABNORMAL HIGH (ref 100–199)
HDL: 78 mg/dL (ref >39)
LDL CALC: 96 mg/dL (ref 0–99)
TRIGLYCERIDES: 136 mg/dL (ref 0–149)
VLDL CHOLESTEROL CAL: 27 mg/dL (ref 5–40)

## 2017-12-19 LAB — HEMOGLOBIN A1C
ESTIMATED AVERAGE GLUCOSE: 108 mg/dL
HEMOGLOBIN A1C: 5.4 % (ref 4.8–5.6)

## 2017-12-19 LAB — HEPATITIS C ANTIBODY: Hep C Virus Ab: <0.1 s/co ratio (ref 0.0–0.9)

## 2017-12-23 ENCOUNTER — Telehealth: Payer: Self-pay | Admitting: Physician Assistant

## 2017-12-23 NOTE — Telephone Encounter (Signed)
Pt called saying she will be getting a copy of her Tetanus vaccine from employee health and she will bring it to our office so we will have a copy for her files  Thanks teri

## 2018-01-15 ENCOUNTER — Ambulatory Visit
Admission: RE | Admit: 2018-01-15 | Discharge: 2018-01-15 | Disposition: A | Discharge status: Home / Self Care | Payer: PPO | Source: Ambulatory Visit | Attending: Physician Assistant | Admitting: Physician Assistant

## 2018-01-15 DIAGNOSIS — Z1231 Encounter for screening mammogram for malignant neoplasm of breast: Secondary | ICD-10-CM | POA: Insufficient documentation

## 2018-01-15 IMAGING — MG MM DIGITAL SCREENING BILAT W/ TOMO W/ CAD
8 series · 9 of 24 positions shown · non-contrast
Comparison: Previous exam(s).

CLINICAL DATA: Screening.

EXAM:
DIGITAL SCREENING BILATERAL MAMMOGRAM WITH TOMO AND CAD

[R MLO synth-2D]
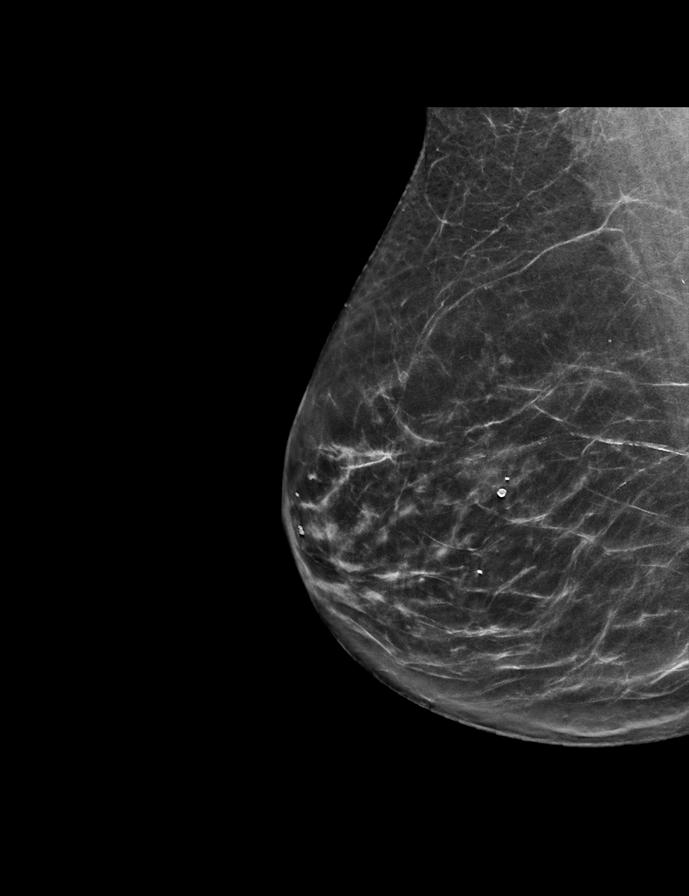

[L MLO synth-2D]
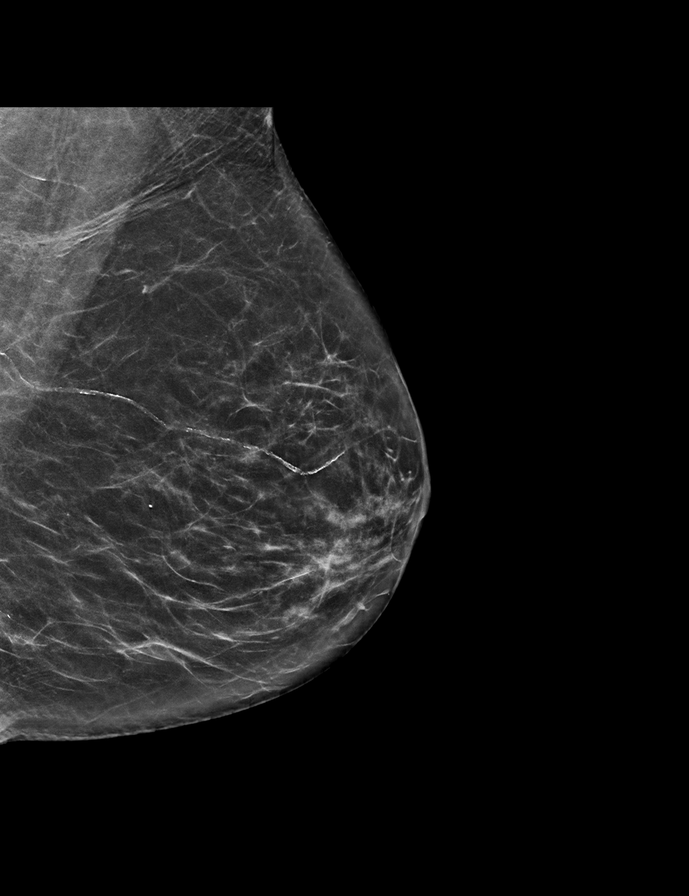

[L CC synth-2D]
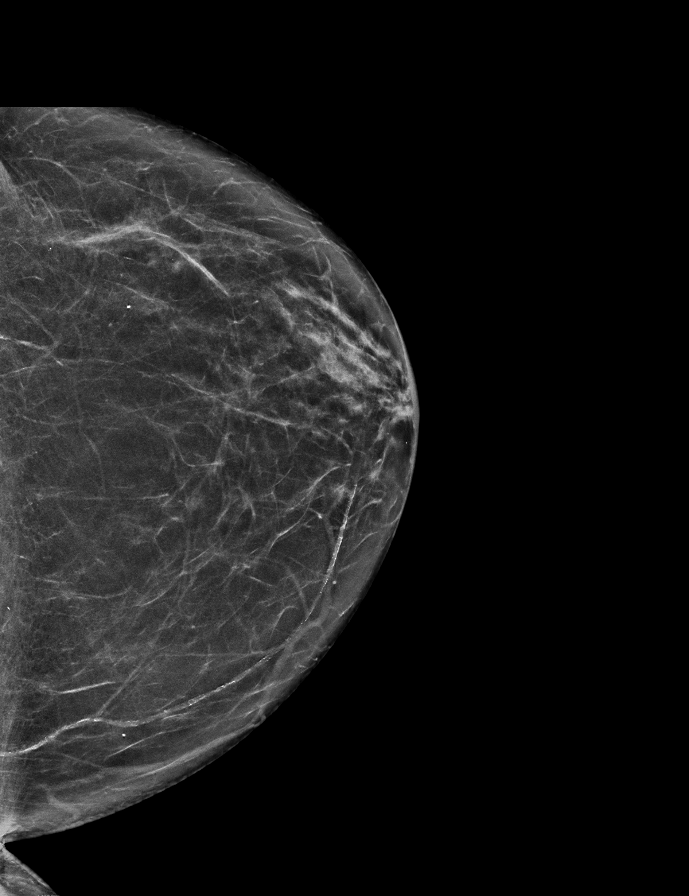

[R CC synth-2D]
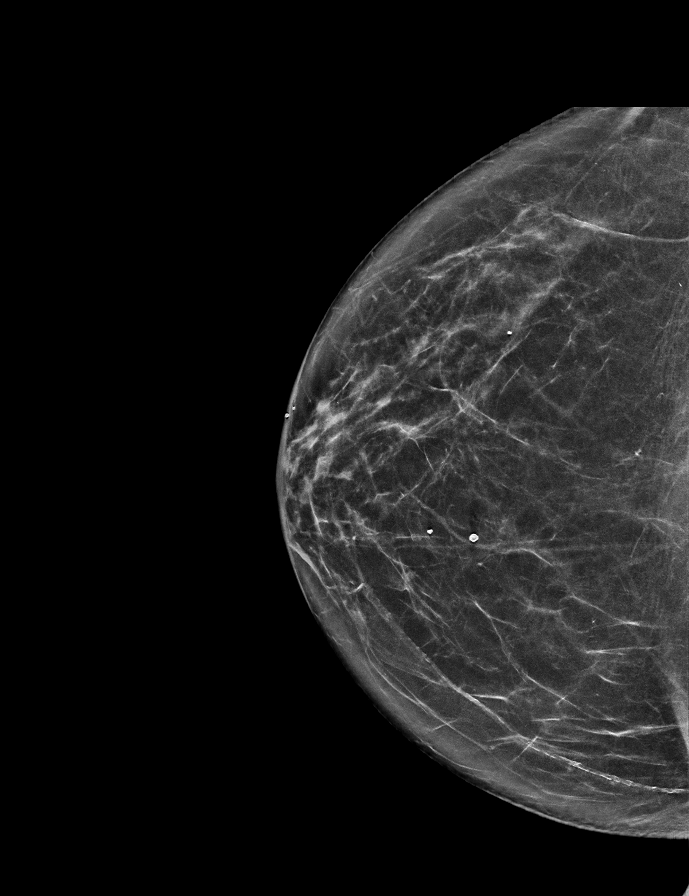

[R MLO tomo · 2 of 65 frames shown]
[frame 21/65]
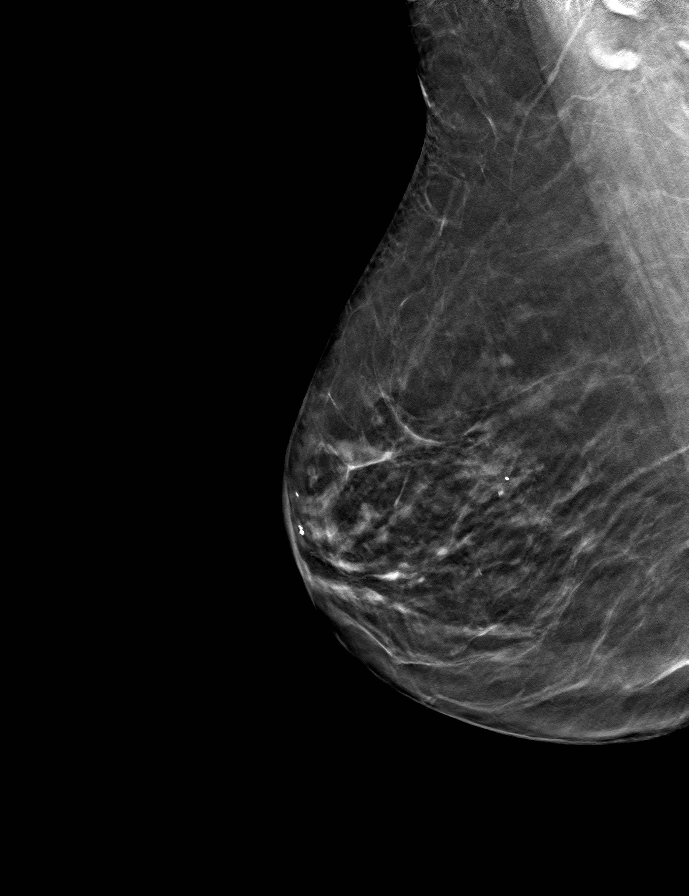
[frame 33/65]
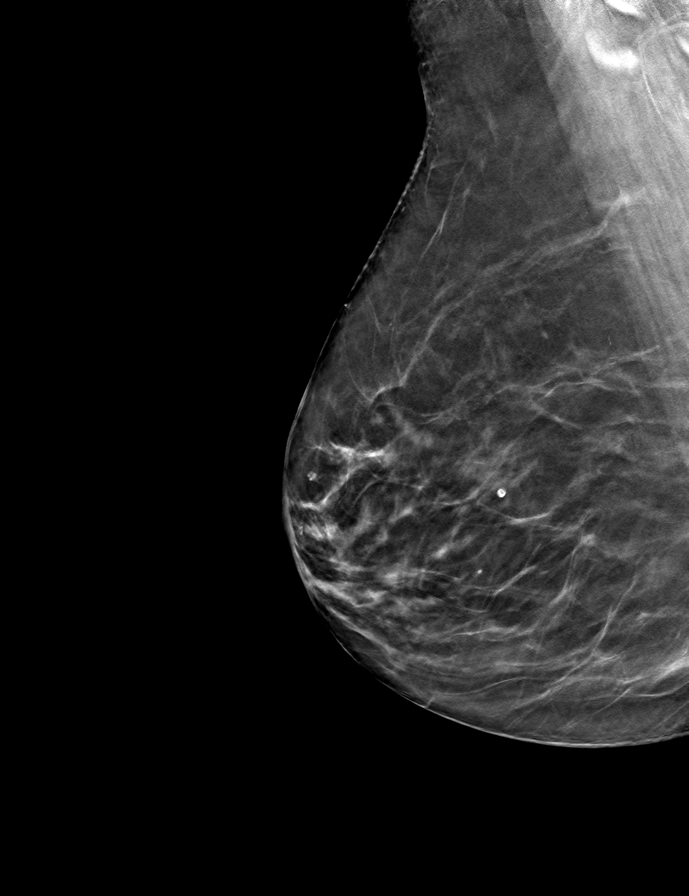

[L MLO tomo · tomo slice 35/68.0]
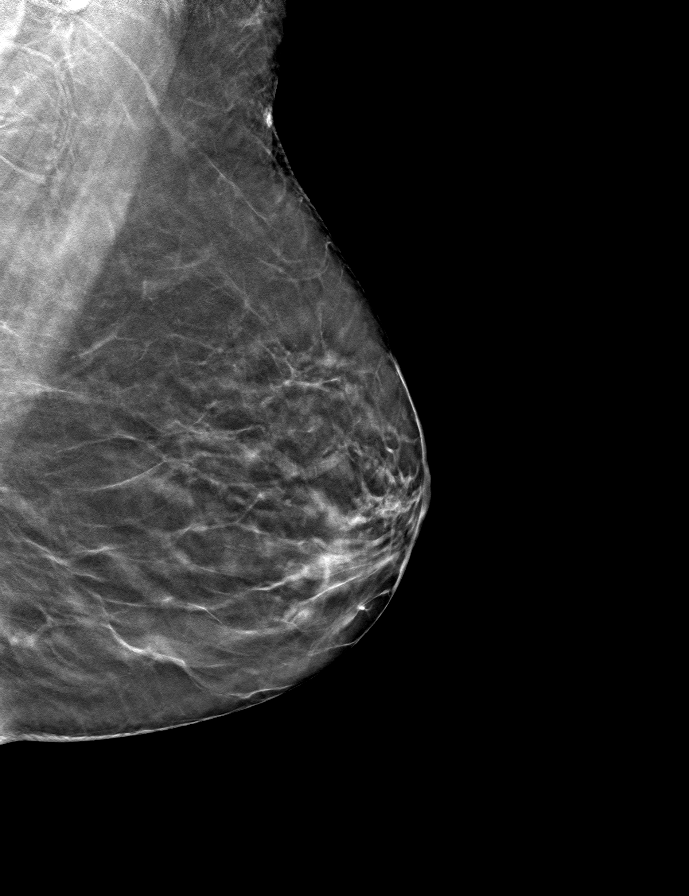

[L CC tomo · tomo slice 31/60.0]
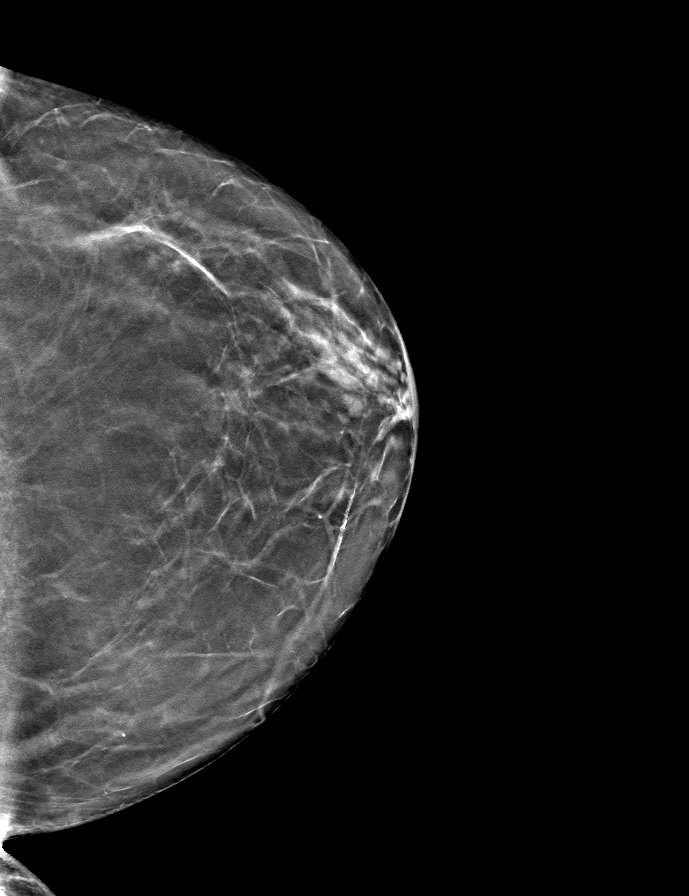

[R CC tomo · tomo slice 31/60.0]
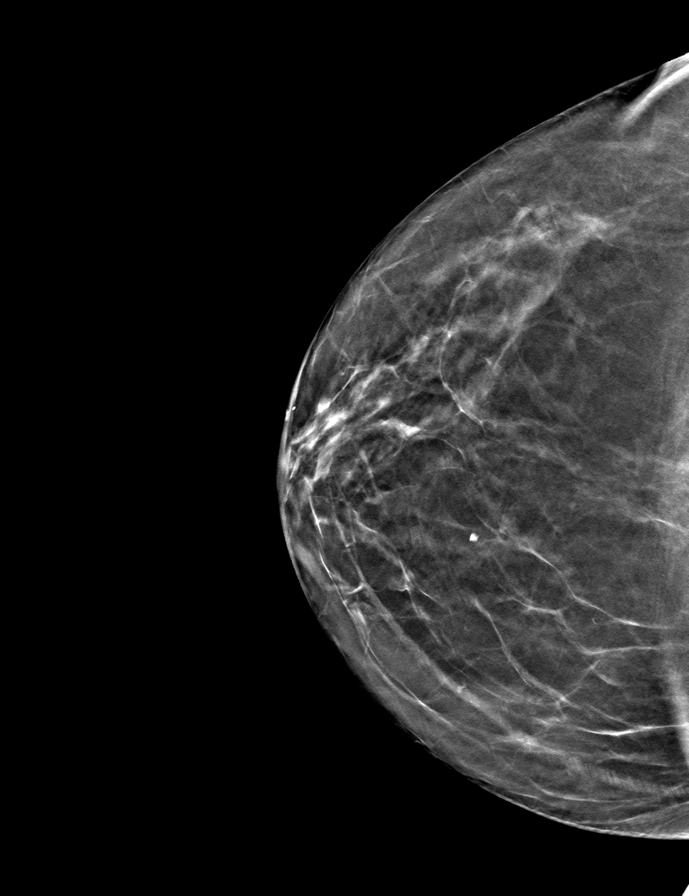

[9 of 24 positions shown; findings below may reference images not displayed]

ACR Breast Density Category b: There are scattered areas of
fibroglandular density.
FINDINGS: There are no findings suspicious for malignancy. Images were
processed with CAD.
IMPRESSION: No mammographic evidence of malignancy. A result letter of this
screening mammogram will be mailed directly to the patient.

RECOMMENDATION:
Screening mammogram in one year. (Code:[TQ])

BI-RADS CATEGORY  1: Negative.

## 2018-02-12 DIAGNOSIS — H5713 Ocular pain, bilateral: Secondary | ICD-10-CM | POA: Diagnosis not present

## 2018-02-25 DIAGNOSIS — H02883 Meibomian gland dysfunction of right eye, unspecified eyelid: Secondary | ICD-10-CM | POA: Diagnosis not present

## 2018-03-11 DIAGNOSIS — H02883 Meibomian gland dysfunction of right eye, unspecified eyelid: Secondary | ICD-10-CM | POA: Diagnosis not present

## 2018-04-29 DIAGNOSIS — H40003 Preglaucoma, unspecified, bilateral: Secondary | ICD-10-CM | POA: Diagnosis not present

## 2018-05-19 DIAGNOSIS — H60543 Acute eczematoid otitis externa, bilateral: Secondary | ICD-10-CM | POA: Diagnosis not present

## 2018-05-19 DIAGNOSIS — H6123 Impacted cerumen, bilateral: Secondary | ICD-10-CM | POA: Diagnosis not present

## 2018-06-04 DIAGNOSIS — H60549 Acute eczematoid otitis externa, unspecified ear: Secondary | ICD-10-CM | POA: Diagnosis not present

## 2018-06-06 ENCOUNTER — Other Ambulatory Visit: Payer: Self-pay | Admitting: Physician Assistant

## 2018-06-06 DIAGNOSIS — I10 Essential (primary) hypertension: Secondary | ICD-10-CM

## 2018-06-30 DIAGNOSIS — J019 Acute sinusitis, unspecified: Secondary | ICD-10-CM | POA: Diagnosis not present

## 2018-07-22 ENCOUNTER — Other Ambulatory Visit: Payer: Self-pay | Admitting: Physician Assistant

## 2018-07-22 DIAGNOSIS — N951 Menopausal and female climacteric states: Secondary | ICD-10-CM

## 2018-07-22 MED ORDER — ESTRADIOL 0.5 MG PO TABS: 0.5000 mg | ORAL_TABLET | Freq: Every day | ORAL | 1 refills | Status: DC

## 2018-07-22 NOTE — Progress Notes (Signed)
Updated estradiol Rx. Patient decreased dose to 0.5mg  daily

## 2018-09-29 DIAGNOSIS — H40003 Preglaucoma, unspecified, bilateral: Secondary | ICD-10-CM | POA: Diagnosis not present

## 2018-10-05 ENCOUNTER — Other Ambulatory Visit: Payer: Self-pay

## 2018-10-05 DIAGNOSIS — H40003 Preglaucoma, unspecified, bilateral: Secondary | ICD-10-CM | POA: Diagnosis not present

## 2018-10-05 NOTE — Patient Outreach (Signed)
[facility]) Care Management  10/05/2018  Aneita Kiger [facility] 11/28/1945 [id number]   Medication Adherence call to Mrs. Estanislado Emms patient answer but did not want to provide with her date of birth and hung up phone she also said her doctor should provide with this information.patient is a HTA  Ins. Pharmacy said  last pick up was on 09/10/18 for a 30 days supply.   [place] Management Direct Dial [phone]  Fax [phone] [email or web address]

## 2018-11-21 ENCOUNTER — Other Ambulatory Visit: Payer: Self-pay | Admitting: Physician Assistant

## 2018-11-21 DIAGNOSIS — N951 Menopausal and female climacteric states: Secondary | ICD-10-CM

## 2018-11-25 DIAGNOSIS — M5416 Radiculopathy, lumbar region: Secondary | ICD-10-CM | POA: Diagnosis not present

## 2018-11-25 DIAGNOSIS — M792 Neuralgia and neuritis, unspecified: Secondary | ICD-10-CM | POA: Diagnosis not present

## 2018-11-25 DIAGNOSIS — Z79899 Other long term (current) drug therapy: Secondary | ICD-10-CM | POA: Diagnosis not present

## 2018-12-07 ENCOUNTER — Other Ambulatory Visit: Payer: Self-pay | Admitting: Physician Assistant

## 2018-12-07 DIAGNOSIS — Z1231 Encounter for screening mammogram for malignant neoplasm of breast: Secondary | ICD-10-CM

## 2018-12-09 ENCOUNTER — Other Ambulatory Visit: Payer: Self-pay | Admitting: Physician Assistant

## 2018-12-09 DIAGNOSIS — I10 Essential (primary) hypertension: Secondary | ICD-10-CM

## 2018-12-21 ENCOUNTER — Ambulatory Visit: Payer: Self-pay

## 2019-02-04 DIAGNOSIS — H60549 Acute eczematoid otitis externa, unspecified ear: Secondary | ICD-10-CM | POA: Diagnosis not present

## 2019-02-04 DIAGNOSIS — H6123 Impacted cerumen, bilateral: Secondary | ICD-10-CM | POA: Diagnosis not present

## 2019-02-10 ENCOUNTER — Other Ambulatory Visit: Payer: Self-pay | Admitting: Physician Assistant

## 2019-02-10 DIAGNOSIS — N952 Postmenopausal atrophic vaginitis: Secondary | ICD-10-CM

## 2019-02-10 MED ORDER — HYDROCORTISONE 2.5 % EX CREA: TOPICAL_CREAM | Freq: Two times a day (BID) | CUTANEOUS | 0 refills | Status: DC

## 2019-02-10 NOTE — Telephone Encounter (Signed)
Pt called would like a refill on   Hydrocortisone 2.5 %  She uses this when she has irritation on her vaginal area  [facility]  CB#  [phone]  Thanks [employer]

## 2019-02-12 DIAGNOSIS — M47816 Spondylosis without myelopathy or radiculopathy, lumbar region: Secondary | ICD-10-CM | POA: Insufficient documentation

## 2019-02-12 DIAGNOSIS — M5416 Radiculopathy, lumbar region: Secondary | ICD-10-CM | POA: Diagnosis not present

## 2019-02-12 DIAGNOSIS — M546 Pain in thoracic spine: Secondary | ICD-10-CM | POA: Diagnosis not present

## 2019-02-12 DIAGNOSIS — M4155 Other secondary scoliosis, thoracolumbar region: Secondary | ICD-10-CM | POA: Diagnosis not present

## 2019-02-12 DIAGNOSIS — M5136 Other intervertebral disc degeneration, lumbar region: Secondary | ICD-10-CM | POA: Insufficient documentation

## 2019-02-12 DIAGNOSIS — M419 Scoliosis, unspecified: Secondary | ICD-10-CM | POA: Insufficient documentation

## 2019-02-15 ENCOUNTER — Other Ambulatory Visit: Payer: Self-pay

## 2019-02-15 ENCOUNTER — Ambulatory Visit
Admission: RE | Admit: 2019-02-15 | Discharge: 2019-02-15 | Disposition: A | Discharge status: Home / Self Care | Payer: PPO | Source: Ambulatory Visit | Attending: Physician Assistant | Admitting: Physician Assistant

## 2019-02-15 DIAGNOSIS — Z1231 Encounter for screening mammogram for malignant neoplasm of breast: Secondary | ICD-10-CM | POA: Diagnosis not present

## 2019-02-15 IMAGING — MG DIGITAL SCREENING BILATERAL MAMMOGRAM WITH TOMO AND CAD
6 of 10 series · 6 of 30 positions shown · non-contrast
Comparison: Previous exam(s).

CLINICAL DATA: Screening.

EXAM:
DIGITAL SCREENING BILATERAL MAMMOGRAM WITH TOMO AND CAD

[L MLO synth-2D (1 of 2)]
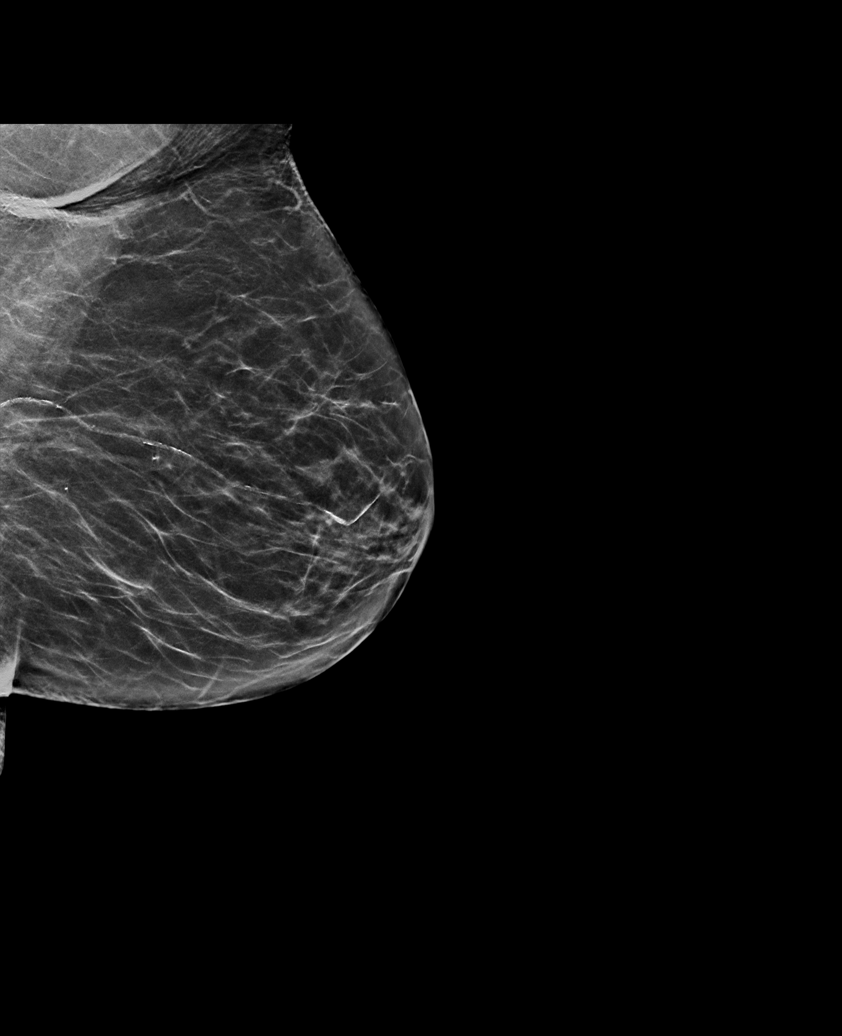

[L CC synth-2D]
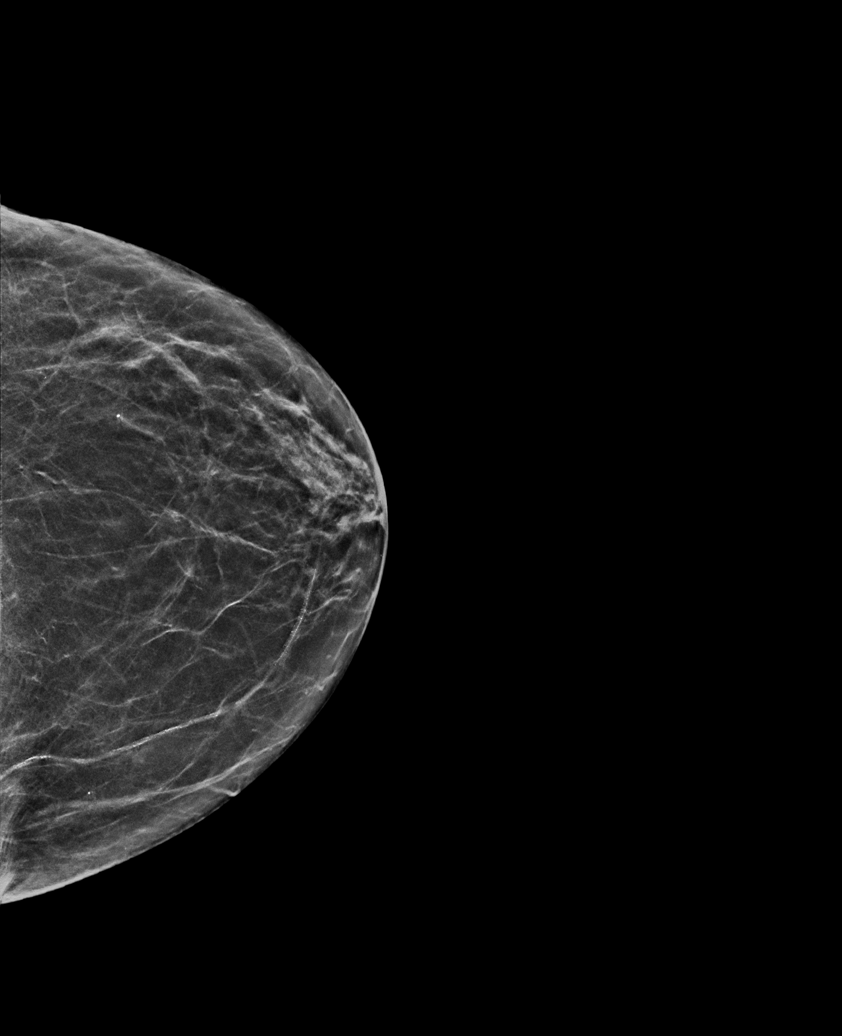

[L MLO synth-2D (2 of 2)]
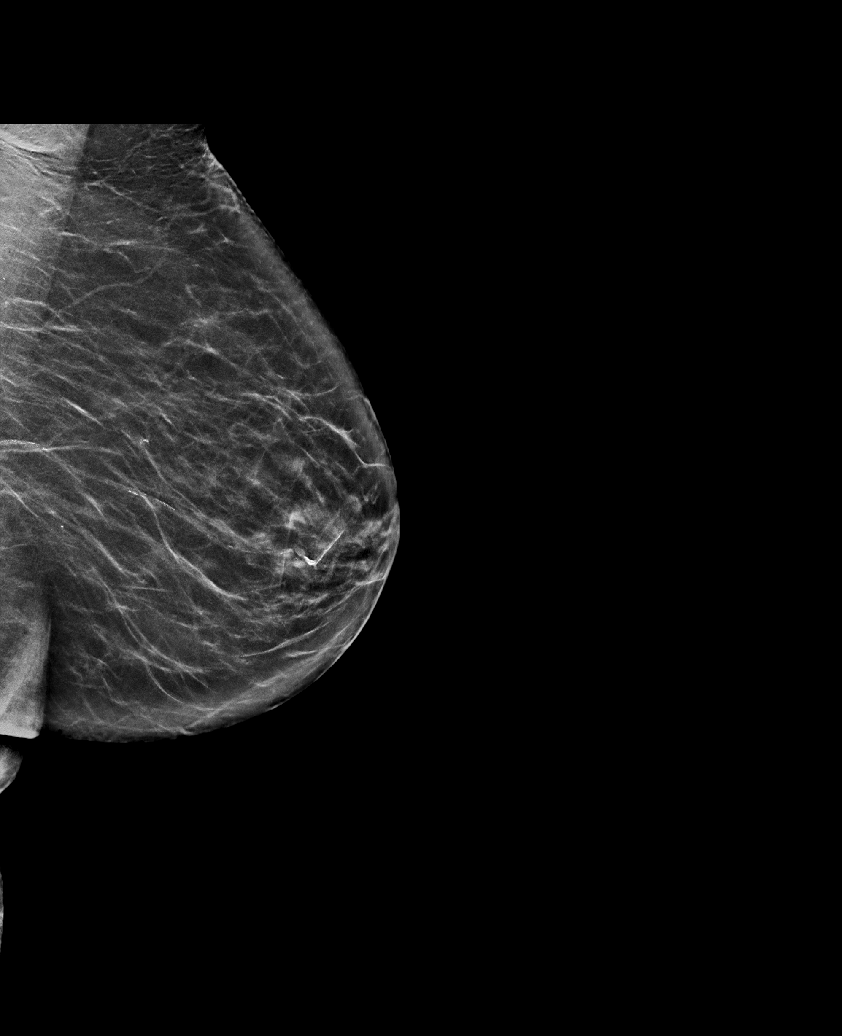

[R CC synth-2D]
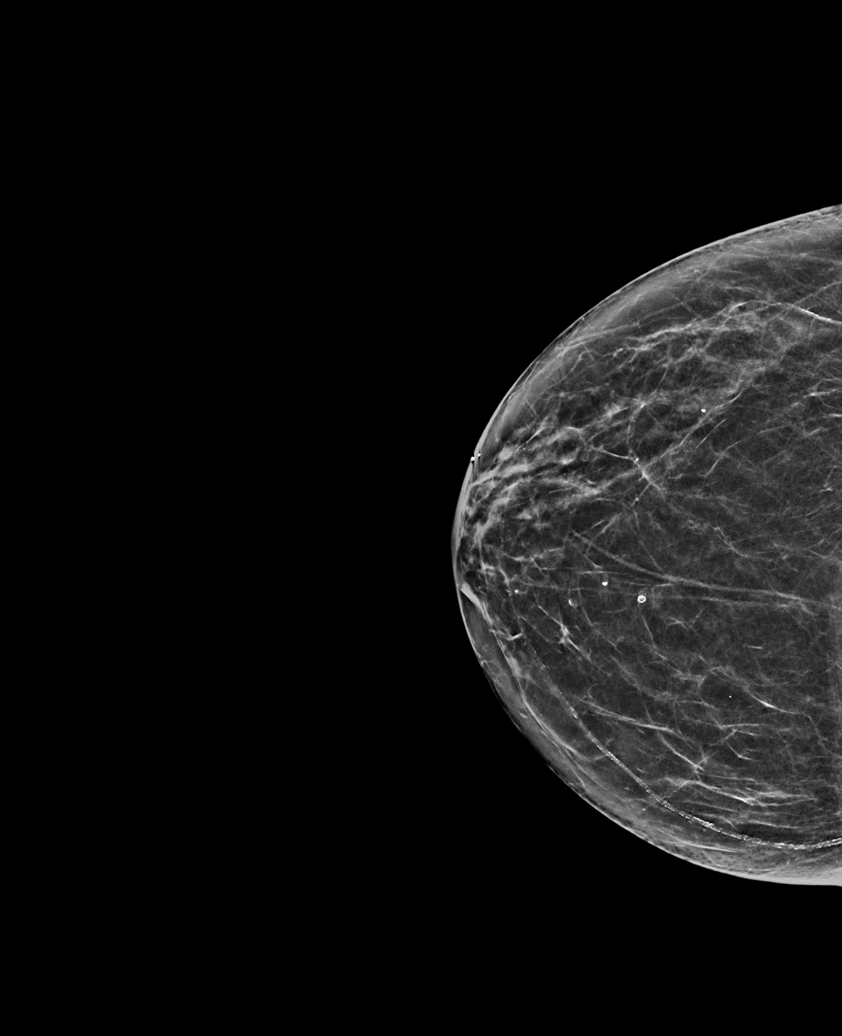

[R MLO synth-2D]
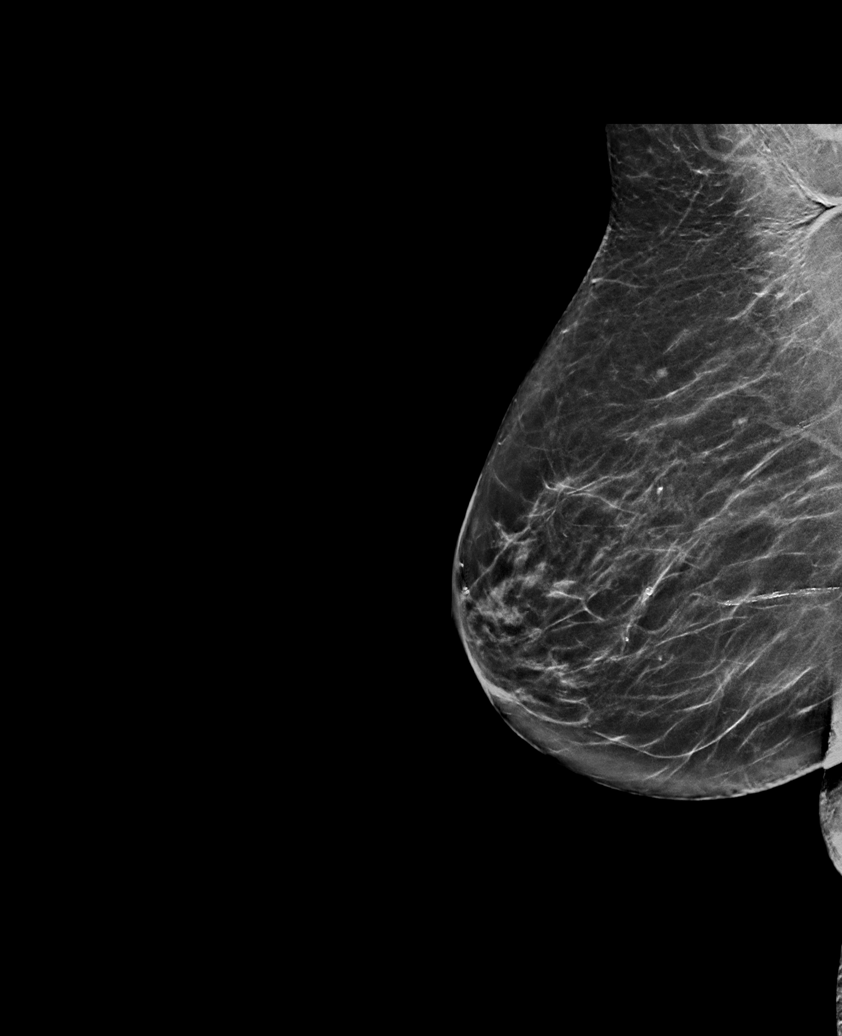

[R MLO tomo · tomo slice 33/66.0]
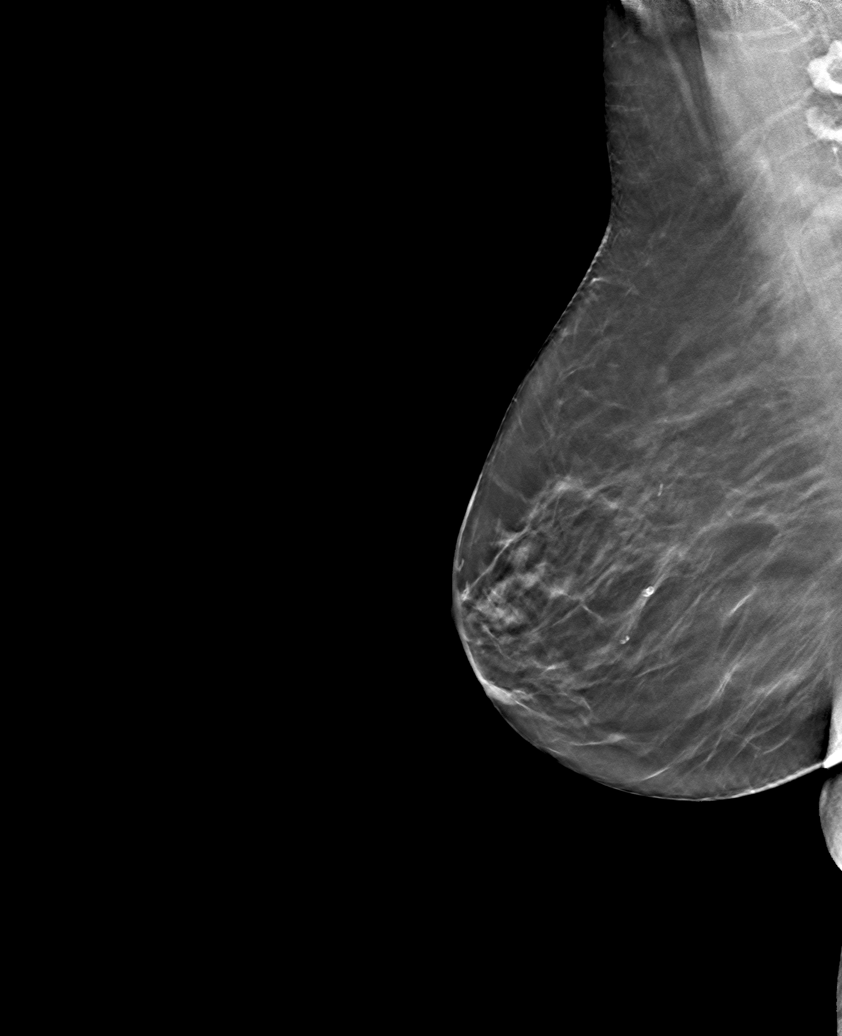

[6 of 30 positions shown; findings below may reference images not displayed]

ACR Breast Density Category b: There are scattered areas of
fibroglandular density.
FINDINGS: There are no findings suspicious for malignancy. Images were
processed with CAD.
IMPRESSION: No mammographic evidence of malignancy. A result letter of this
screening mammogram will be mailed directly to the patient.

RECOMMENDATION:
Screening mammogram in one year. (Code:[TQ])

BI-RADS CATEGORY  1: Negative.

## 2019-02-22 ENCOUNTER — Telehealth: Payer: Self-pay | Admitting: Physician Assistant

## 2019-02-22 NOTE — Telephone Encounter (Signed)
Patient reports that the diarrhea is done. Denies fever, sob, cough,vomiting. She was just concerned that she probably had the Coronavirus. Told patient to call back if she developed any other symptoms but since her diarrhea was gone and she is eating well and doesn't have any other symptoms to just do a blend diet.

## 2019-02-22 NOTE — Telephone Encounter (Signed)
Pt has had diarrhea since Sunday early afternoon.  Pt took Tums and Imiodum AD. Pt stopped with the diarrhea early this morning around 1 am.    Still feeling weak and stomach is not settled.  Needing a call back to discuss.  Please advise.  Thanks, [employer]

## 2019-02-23 ENCOUNTER — Ambulatory Visit (INDEPENDENT_AMBULATORY_CARE_PROVIDER_SITE_OTHER): Payer: PPO

## 2019-02-23 ENCOUNTER — Other Ambulatory Visit: Payer: Self-pay

## 2019-02-23 ENCOUNTER — Telehealth: Payer: Self-pay | Admitting: Physician Assistant

## 2019-02-23 DIAGNOSIS — Z0000 Encounter for general adult medical examination without abnormal findings: Secondary | ICD-10-CM | POA: Diagnosis not present

## 2019-02-23 NOTE — Telephone Encounter (Signed)
Patient was advised.  

## 2019-02-23 NOTE — Telephone Encounter (Signed)
Pt having diarrhea for the past day and a half.  Stomach is rumbling and not settled.  Filling wiped out.  Wants a call back to see what she needs to do.  Please advise.  Thanks, [employer]

## 2019-02-23 NOTE — Progress Notes (Signed)
Subjective:   Sarah Todd is a 73 y.o. female who presents for Medicare Annual (Subsequent) preventive examination.    This visit is being conducted through telemedicine due to the COVID-19 pandemic. This patient has given me verbal consent via doximity to conduct this visit, patient states they are participating from their home address. Some vital signs may be absent or patient reported.    Patient identification: identified by name, DOB, and current address  Review of Systems:  N/A  Cardiac Risk Factors include: advanced age (>59men, >34 women);hypertension     Objective:     Vitals: There were no vitals taken for this visit.  There is no height or weight on file to calculate BMI. Unable to obtain vitals due to visit being conducted via telephonically.   Advanced Directives 02/23/2019 12/18/2017 12/13/2016 05/17/2016 12/13/2015 02/06/2015  Does Patient Have a Medical Advance Directive? Yes Yes Yes Yes Yes Yes  Type of Paramedic of [place];Living will Living will - Living will Living will Living will  Copy of [place] in Chart? Yes - validated most recent copy scanned in chart (See row information) - - - - -    Tobacco Social History   Tobacco Use  Smoking Status Never Smoker  Smokeless Tobacco Never Used     Counseling given: Not Answered   Clinical Intake:     Pain : 0-10 Pain Score: 8  Pain Location: Abdomen(Due to a recent stomach virus.) Pain Descriptors / Indicators: Aching Pain Frequency: Intermittent     Nutritional Risks: Nausea/ vomitting/ diarrhea(Diarrhea recently due stomach virus.) Diabetes: No  How often do you need to have someone help you when you read instructions, pamphlets, or other written materials from your doctor or pharmacy?: 1 - Never  Interpreter Needed?: No  Information entered by :: [facility], LPN  Past Medical History:  Diagnosis Date  . Allergy   . Arthritis    hands  .  Hypertension   . Osteoporosis   . Varicose veins    Mild   Past Surgical History:  Procedure Laterality Date  . ABDOMINAL HYSTERECTOMY    . BACK SURGERY    . COLONOSCOPY N/A 01/05/2015   Procedure: COLONOSCOPY;  Surgeon: Lucilla Lame, MD;  Location: [facility];  Service: Gastroenterology;  Laterality: N/A;  . DILATION AND CURETTAGE OF UTERUS    . TUBAL LIGATION     Family History  Problem Relation Age of Onset  . Cancer Mother        colon cancer  . Alzheimer's disease Father   . Heart disease Father   . Hypertension Father   . Cancer Sister        uterine  . Breast cancer Neg Hx    Social History   Socioeconomic History  . Marital status: Married    Spouse name: Breon Diss  . Number of children: 3  . Years of education: 103  . Highest education level: Some college, no degree  Occupational History  . Occupation: retired  Scientific laboratory technician  . Financial resource strain: Not hard at all  . Food insecurity    Worry: Never true    Inability: Never true  . Transportation needs    Medical: No    Non-medical: No  Tobacco Use  . Smoking status: Never Smoker  . Smokeless tobacco: Never Used  Substance and Sexual Activity  . Alcohol use: No  . Drug use: No  . Sexual activity: Yes  Lifestyle  .  Physical activity    Days per week: 0 days    Minutes per session: 0 min  . Stress: Not at all  Relationships  . Social Herbalist on phone: Patient refused    Gets together: Patient refused    Attends religious service: Patient refused    Active member of club or organization: Patient refused    Attends meetings of clubs or organizations: Patient refused    Relationship status: Patient refused  Other Topics Concern  . Not on file  Social History Narrative  . Not on file    Outpatient Encounter Medications as of 02/23/2019  Medication Sig  . acetaminophen (TYLENOL) 650 MG CR tablet Take 650 mg by mouth every 4 (four) hours as needed for pain.  Marland Kitchen  aspirin 81 MG tablet Take 81 mg by mouth daily.   . Calcium Citrate 200 MG TABS Take 1 tablet by mouth daily.   . Cholecalciferol (VITAMIN D-3 PO) Take 1,000 Units by mouth daily. AM  . estradiol (ESTRACE) 0.5 MG tablet TAKE ONE TABLET EVERY DAY  . gabapentin (NEURONTIN) 600 MG tablet Take 600 mg by mouth 2 (two) times daily. 600 mg in AM and 900 mg in PM  . hydrocortisone 2.5 % cream Apply topically 2 (two) times daily. (Patient taking differently: Apply topically 2 (two) times daily. As needed)  . losartan (COZAAR) 50 MG tablet TAKE ONE TABLET BY MOUTH EVERY MORNING  . Multiple Vitamin (MULTIVITAMIN) capsule Take 1 capsule by mouth daily. AM  . Omega 3 1000 MG CAPS Take 1,000 mg by mouth daily. AM  . vitamin C (ASCORBIC ACID) 500 MG tablet Take by mouth.   No facility-administered encounter medications on file as of 02/23/2019.     Activities of Daily Living In your present state of health, do you have any difficulty performing the following activities: 02/23/2019  Hearing? N  Vision? N  Comment Wears eye glasses.  Difficulty concentrating or making decisions? N  Walking or climbing stairs? N  Dressing or bathing? N  Doing errands, shopping? N  Preparing Food and eating ? N  Using the Toilet? N  In the past six months, have you accidently leaked urine? N  Do you have problems with loss of bowel control? N  Managing your Medications? N  Managing your Finances? N  Housekeeping or managing your Housekeeping? N  Some recent data might be hidden    Patient Care Team: Mar Daring, PA-C as PCP - General (Family Medicine) Arelia Sneddon, [place] as Consulting Physician (Optometry) Jovita Gamma, MD as Consulting Physician (Neurosurgery)    Assessment:   This is a routine wellness examination for Sarah Todd.  Exercise Activities and Dietary recommendations Current Exercise Habits: The patient does not participate in regular exercise at present, Exercise limited by: None  identified  Goals    . DIET - INCREASE WATER INTAKE     Recommend increasing water intake to 6 glasses a day.        Fall Risk: Fall Risk  02/23/2019 12/18/2017 12/13/2016 12/13/2015 02/06/2015  Falls in the past year? 0 No No No No    FALL RISK PREVENTION PERTAINING TO THE HOME:  Any stairs in or around the home? Yes  If so, are there any without handrails? No   Home free of loose throw rugs in walkways, pet beds, electrical cords, etc? Yes  Adequate lighting in your home to reduce risk of falls? Yes   ASSISTIVE DEVICES UTILIZED TO PREVENT FALLS:  Life alert? No  Use of a cane, walker or w/c? No  Grab bars in the bathroom? No  Shower chair or bench in shower? No  Elevated toilet seat or a handicapped toilet? No    TIMED UP AND GO:  Was the test performed? No .    Depression Screen PHQ 2/9 Scores 02/23/2019 02/23/2019 12/18/2017 12/18/2017  PHQ - 2 Score 0 0 0 0  PHQ- 9 Score 0 - 4 -     Cognitive Function:      6CIT Screen 02/23/2019 12/18/2017  What Year? 0 points 0 points  What month? 0 points 0 points  What time? 0 points 0 points  Count back from 20 0 points 0 points  Months in reverse 0 points 0 points  Repeat phrase 0 points 0 points  Total Score 0 0    Immunization History  Administered Date(s) Administered  . Influenza,inj,Quad PF,6+ Mos 06/15/2015  . Influenza-Unspecified 05/13/2017, 06/09/2018  . Pneumococcal Conjugate-13 11/17/2014  . Pneumococcal Polysaccharide-23 08/03/2013  . Tdap 10/16/2016  . Zoster Recombinat (Shingrix) 09/09/2018    Qualifies for Shingles Vaccine? Yes , awaiting second dose.   Tdap: Up to date  Flu Vaccine: Up to date  Pneumococcal Vaccine: Completed series  Screening Tests Health Maintenance  Topic Date Due  . INFLUENZA VACCINE  04/03/2019  . COLONOSCOPY  01/05/2020  . TETANUS/TDAP  10/16/2026  . DEXA SCAN  Completed  . Hepatitis C Screening  Completed  . PNA vac Low Risk Adult  Completed    Cancer  Screenings:  Colorectal Screening: Completed 01/15/15. Repeat every 5 years.  Mammogram: Completed 02/15/19. Repeat every yearly.  Bone Density: Completed 10/05/13. Results reflect NORMAL. No repeat needed.   Lung Cancer Screening: (Low Dose CT Chest recommended if Age 31-80 years, 30 pack-year currently smoking OR have quit w/in 15years.) does not qualify.   Additional Screening:  Hepatitis C Screening: Up to date  Vision Screening: Recommended annual ophthalmology exams for early detection of glaucoma and other disorders of the eye.  Dental Screening: Recommended annual dental exams for proper oral hygiene  Community Resource Referral:  CRR required this visit?  No       Plan:  I have personally reviewed and addressed the Medicare Annual Wellness questionnaire and have noted the following in the patient's chart:  A. Medical and social history B. Use of alcohol, tobacco or illicit drugs  C. Current medications and supplements D. Functional ability and status E.  Nutritional status F.  Physical activity G. Advance directives H. List of other physicians I.  Hospitalizations, surgeries, and ER visits in previous 12 months J.  [place] such as hearing and vision if needed, cognitive and depression L. Referrals and appointments   In addition, I have reviewed and discussed with patient certain preventive protocols, quality metrics, and best practice recommendations. A written personalized care plan for preventive services as well as general preventive health recommendations were provided to patient. Nurse Health Advisor  Signed,    Martez Weiand [place], [place]  12/03/4740 Nurse Health Advisor   Nurse Notes: None.

## 2019-02-23 NOTE — Telephone Encounter (Signed)
Just keep pushing fluids. May use pepcid (famotidine) or prilosec (omperazole) for the burning. Call if worsening or not improving.

## 2019-02-23 NOTE — Telephone Encounter (Signed)
Patient stats she had solid stool this morning, so no more diarrhea. Patient does state she is having a constant burning sensation in her lower abdomen. Patient also reports to having fatigue. Patient wanted advised on what she should do? Please advise?

## 2019-02-23 NOTE — Telephone Encounter (Signed)
Pt wanted to call and update on a provider and medication she left off on today's appt.   Dr. Tami Ribas   2 Medications PRN  Mometasone Furoate 1% -drops  Mometasone Furoate 1%-Cream

## 2019-02-23 NOTE — Telephone Encounter (Signed)
Updated information in chart.  -MM

## 2019-02-23 NOTE — Patient Instructions (Signed)
Sarah Todd , Thank you for taking time to come for your Medicare Wellness Visit. I appreciate your ongoing commitment to your health goals. Please review the following plan we discussed and let me know if I can assist you in the future.   Screening recommendations/referrals: Colonoscopy: Up to date, due 01/2020 Mammogram: Up to date, due 01/2021 Bone Density: Up to date, last DEXA was normal. No repeat needed.  Recommended yearly ophthalmology/optometry visit for glaucoma screening and checkup Recommended yearly dental visit for hygiene and checkup  Vaccinations: Influenza vaccine: Up to date Pneumococcal vaccine: Completed series Tdap vaccine: Up to date, due 10/2026 Shingles vaccine: Awaiting Shingrix # 2    Advanced directives: Currently on file.   Conditions/risks identified: Continue to increase water intake to 6-8 8 oz glasses a day.   Next appointment: 04/23/19 @ 2:00 PM with Fenton Malling.    Preventive Care 75 Years and Older, Female Preventive care refers to lifestyle choices and visits with your health care provider that can promote health and wellness. What does preventive care include?  A yearly physical exam. This is also called an annual well check.  Dental exams once or twice a year.  Routine eye exams. Ask your health care provider how often you should have your eyes checked.  Personal lifestyle choices, including:  Daily care of your teeth and gums.  Regular physical activity.  Eating a healthy diet.  Avoiding tobacco and drug use.  Limiting alcohol use.  Practicing safe sex.  Taking low-dose aspirin every day.  Taking vitamin and mineral supplements as recommended by your health care provider. What happens during an annual well check? The services and screenings done by your health care provider during your annual well check will depend on your age, overall health, lifestyle risk factors, and family history of disease. Counseling  Your  health care provider may ask you questions about your:  Alcohol use.  Tobacco use.  Drug use.  Emotional well-being.  Home and relationship well-being.  Sexual activity.  Eating habits.  History of falls.  Memory and ability to understand (cognition).  Work and work Statistician.  Reproductive health. Screening  You may have the following tests or measurements:  Height, weight, and BMI.  Blood pressure.  Lipid and cholesterol levels. These may be checked every 5 years, or more frequently if you are over 46 years old.  Skin check.  Lung cancer screening. You may have this screening every year starting at age 61 if you have a 30-pack-year history of smoking and currently smoke or have quit within the past 15 years.  Fecal occult blood test (FOBT) of the stool. You may have this test every year starting at age 30.  Flexible sigmoidoscopy or colonoscopy. You may have a sigmoidoscopy every 5 years or a colonoscopy every 10 years starting at age 29.  Hepatitis C blood test.  Hepatitis B blood test.  Sexually transmitted disease (STD) testing.  Diabetes screening. This is done by checking your blood sugar (glucose) after you have not eaten for a while (fasting). You may have this done every 1-3 years.  Bone density scan. This is done to screen for osteoporosis. You may have this done starting at age 61.  Mammogram. This may be done every 1-2 years. Talk to your health care provider about how often you should have regular mammograms. Talk with your health care provider about your test results, treatment options, and if necessary, the need for more tests. Vaccines  Your health care  provider may recommend certain vaccines, such as:  Influenza vaccine. This is recommended every year.  Tetanus, diphtheria, and acellular pertussis (Tdap, Td) vaccine. You may need a Td booster every 10 years.  Zoster vaccine. You may need this after age 47.  Pneumococcal 13-valent  conjugate (PCV13) vaccine. One dose is recommended after age 31.  Pneumococcal polysaccharide (PPSV23) vaccine. One dose is recommended after age 50. Talk to your health care provider about which screenings and vaccines you need and how often you need them. This information is not intended to replace advice given to you by your health care provider. Make sure you discuss any questions you have with your health care provider. Document Released: 09/15/2015 Document Revised: 05/08/2016 Document Reviewed: 06/20/2015 Elsevier Interactive Patient Education  2017 [place] Prevention in the Home Falls can cause injuries. They can happen to people of all ages. There are many things you can do to make your home safe and to help prevent falls. What can I do on the outside of my home?  Regularly fix the edges of walkways and driveways and fix any cracks.  Remove anything that might make you trip as you walk through a door, such as a raised step or threshold.  Trim any bushes or trees on the path to your home.  Use bright outdoor lighting.  Clear any walking paths of anything that might make someone trip, such as rocks or tools.  Regularly check to see if handrails are loose or broken. Make sure that both sides of any steps have handrails.  Any raised decks and porches should have guardrails on the edges.  Have any leaves, snow, or ice cleared regularly.  Use sand or salt on walking paths during winter.  Clean up any spills in your garage right away. This includes oil or grease spills. What can I do in the bathroom?  Use night lights.  Install grab bars by the toilet and in the tub and shower. Do not use towel bars as grab bars.  Use non-skid mats or decals in the tub or shower.  If you need to sit down in the shower, use a plastic, non-slip stool.  Keep the floor dry. Clean up any water that spills on the floor as soon as it happens.  Remove soap buildup in the tub or  shower regularly.  Attach bath mats securely with double-sided non-slip rug tape.  Do not have throw rugs and other things on the floor that can make you trip. What can I do in the bedroom?  Use night lights.  Make sure that you have a light by your bed that is easy to reach.  Do not use any sheets or blankets that are too big for your bed. They should not hang down onto the floor.  Have a firm chair that has side arms. You can use this for support while you get dressed.  Do not have throw rugs and other things on the floor that can make you trip. What can I do in the kitchen?  Clean up any spills right away.  Avoid walking on wet floors.  Keep items that you use a lot in easy-to-reach places.  If you need to reach something above you, use a strong step stool that has a grab bar.  Keep electrical cords out of the way.  Do not use floor polish or wax that makes floors slippery. If you must use wax, use non-skid floor wax.  Do not have throw  rugs and other things on the floor that can make you trip. What can I do with my stairs?  Do not leave any items on the stairs.  Make sure that there are handrails on both sides of the stairs and use them. Fix handrails that are broken or loose. Make sure that handrails are as long as the stairways.  Check any carpeting to make sure that it is firmly attached to the stairs. Fix any carpet that is loose or worn.  Avoid having throw rugs at the top or bottom of the stairs. If you do have throw rugs, attach them to the floor with carpet tape.  Make sure that you have a light switch at the top of the stairs and the bottom of the stairs. If you do not have them, ask someone to add them for you. What else can I do to help prevent falls?  Wear shoes that:  Do not have high heels.  Have rubber bottoms.  Are comfortable and fit you well.  Are closed at the toe. Do not wear sandals.  If you use a stepladder:  Make sure that it is fully  opened. Do not climb a closed stepladder.  Make sure that both sides of the stepladder are locked into place.  Ask someone to hold it for you, if possible.  Clearly mark and make sure that you can see:  Any grab bars or handrails.  First and last steps.  Where the edge of each step is.  Use tools that help you move around (mobility aids) if they are needed. These include:  Canes.  Walkers.  Scooters.  Crutches.  Turn on the lights when you go into a dark area. Replace any light bulbs as soon as they burn out.  Set up your furniture so you have a clear path. Avoid moving your furniture around.  If any of your floors are uneven, fix them.  If there are any pets around you, be aware of where they are.  Review your medicines with your doctor. Some medicines can make you feel dizzy. This can increase your chance of falling. Ask your doctor what other things that you can do to help prevent falls. This information is not intended to replace advice given to you by your health care provider. Make sure you discuss any questions you have with your health care provider. Document Released: 06/15/2009 Document Revised: 01/25/2016 Document Reviewed: 09/23/2014 Elsevier Interactive Patient Education  2017 [employer].

## 2019-02-25 ENCOUNTER — Ambulatory Visit: Payer: Self-pay

## 2019-02-26 ENCOUNTER — Telehealth: Payer: Self-pay

## 2019-02-26 NOTE — Progress Notes (Signed)
Patient: Sarah Todd Female    DOB: 17-Jul-1946   73 y.o.   MRN: [id number] Visit Date: 03/01/2019  Today's Provider: Mar Daring, PA-C   Chief Complaint  Patient presents with  . Follow-up  . Constipation   Subjective:     HPI   Patient is here concerning constipation. Patient called office on 02/26/2019 complaining of symptoms of diarrhea. Patient was advised to try stool softener, dulcolax or miralax. Patient states she has been taking Miralax with moderate relief. Patient also states she took a medication that is prescribed for patient's who are prepping for a colonoscopy, once to get relief from constipation.     Allergies  Allergen Reactions  . Lisinopril Anaphylaxis  . Shrimp [Shellfish Allergy] Anaphylaxis  . Aspercreme [Trolamine Salicylate] Rash     Current Outpatient Medications:  .  acetaminophen (TYLENOL) 650 MG CR tablet, Take 650 mg by mouth every 4 (four) hours as needed for pain., Disp: , Rfl:  .  aspirin 81 MG tablet, Take 81 mg by mouth daily. , Disp: , Rfl:  .  Calcium Citrate 200 MG TABS, Take 1 tablet by mouth daily. , Disp: , Rfl:  .  Cholecalciferol (VITAMIN D-3 PO), Take 1,000 Units by mouth daily. AM, Disp: , Rfl:  .  DICLOFENAC PO, Take by mouth 2 (two) times a day., Disp: , Rfl:  .  estradiol (ESTRACE) 0.5 MG tablet, TAKE ONE TABLET EVERY DAY, Disp: 90 tablet, Rfl: 1 .  Famotidine (PEPCID PO), Take by mouth., Disp: , Rfl:  .  gabapentin (NEURONTIN) 600 MG tablet, Take 600 mg by mouth 2 (two) times daily. 600 mg in AM and 900 mg in PM, Disp: , Rfl:  .  hydrocortisone 2.5 % cream, Apply topically 2 (two) times daily. (Patient taking differently: Apply topically 2 (two) times daily. As needed), Disp: 30 g, Rfl: 0 .  losartan (COZAAR) 50 MG tablet, TAKE ONE TABLET BY MOUTH EVERY MORNING, Disp: 90 tablet, Rfl: 1 .  Multiple Vitamin (MULTIVITAMIN) capsule, Take 1 capsule by mouth daily. AM, Disp: , Rfl:  .  Omega 3 1000 MG CAPS, Take  1,000 mg by mouth daily. AM, Disp: , Rfl:  .  Omeprazole (PRILOSEC PO), Take by mouth., Disp: , Rfl:  .  vitamin C (ASCORBIC ACID) 500 MG tablet, Take by mouth., Disp: , Rfl:  .  mometasone (ELOCON) 0.1 % cream, Apply 1 application topically daily. As needed, Disp: , Rfl:  .  MOMETASONE FUROATE EX, Apply topically. Solution to use as needed, Disp: , Rfl:   Review of Systems  Constitutional: Negative.   Respiratory: Negative.   Cardiovascular: Negative.   Gastrointestinal: Positive for abdominal pain, constipation and diarrhea.  Neurological: Negative.     Social History   Tobacco Use  . Smoking status: Never Smoker  . Smokeless tobacco: Never Used  Substance Use Topics  . Alcohol use: No      Objective:   BP 117/72 (BP Location: Right Arm, Patient Position: Sitting, Cuff Size: Large)   Pulse 72   Temp 97.9 F (36.6 C) (Oral)   Resp 16   Ht 5\' 2"  (1.575 m)   Wt 139 lb (63 kg)   SpO2 95%   BMI 25.42 kg/m  Vitals:   03/01/19 1050  BP: 117/72  Pulse: 72  Resp: 16  Temp: 97.9 F (36.6 C)  TempSrc: Oral  SpO2: 95%  Weight: 139 lb (63 kg)  Height: 5\' 2"  (1.575  m)     Physical Exam Vitals signs reviewed.  Constitutional:      General: She is not in acute distress.    Appearance: Normal appearance. She is well-developed and normal weight. She is not ill-appearing or diaphoretic.  Cardiovascular:     Rate and Rhythm: Normal rate and regular rhythm.     Heart sounds: Normal heart sounds. No murmur. No friction rub. No gallop.   Pulmonary:     Effort: Pulmonary effort is normal. No respiratory distress.     Breath sounds: Normal breath sounds. No wheezing or rales.  Abdominal:     General: Bowel sounds are normal. There is no distension.     Palpations: Abdomen is soft. There is no mass.     Tenderness: There is generalized abdominal tenderness. There is no guarding or rebound.  Skin:    General: Skin is warm and dry.  Neurological:     Mental Status: She is  alert and oriented to person, place, and time.     No results found for any visits on 03/01/19.     Assessment & Plan    1. Diarrhea, unspecified type Will check labs and stool studies as below and f/u pending results. Advised to use Imodium for diarrhea. Miralax if constipation occurs. Discussed starting a probiotic. I will see her back in 2 weeks to f/u results and see how she is doing.  - CBC w/Diff/Platelet - Comprehensive Metabolic Panel (CMET) - Lipase - Cdiff NAA+O+P+Stool Culture  2. Upset stomach See above medical treatment plan. - CBC w/Diff/Platelet - Comprehensive Metabolic Panel (CMET) - Lipase     Mar Daring, PA-C  [facility]

## 2019-02-26 NOTE — Telephone Encounter (Signed)
Patient advised. Appointment scheduled for Monday

## 2019-02-26 NOTE — Telephone Encounter (Signed)
Patient reports that since Sunday she has been having diarrhea and reports that she has been taking the Pepcid and Prilosec. Now she reports that she is not able to go to the bathroom because she is constipated. She reports that she is having pain in her abdomen all over. No blood on the stool. No mucus. She reports that she ate two eggs (boiled) a piece of english muffin, a banana and Gatorade to get the fluids in this morning. She is asking if it is the type of diet that she is doing? She is requesting advise on what diet to follow. Offered patient e-visit or in person for Monday but she wanted me to send a message first. I also sent an activation code for my chart  CB# [phone]

## 2019-02-26 NOTE — Telephone Encounter (Signed)
Try stool softener, dulcolax or miralax to help with BM. She should still schedule next week but can cancel if symptoms resolve.

## 2019-03-01 ENCOUNTER — Other Ambulatory Visit: Payer: Self-pay

## 2019-03-01 ENCOUNTER — Encounter: Payer: Self-pay | Admitting: Physician Assistant

## 2019-03-01 ENCOUNTER — Ambulatory Visit (INDEPENDENT_AMBULATORY_CARE_PROVIDER_SITE_OTHER): Payer: PPO | Admitting: Physician Assistant

## 2019-03-01 VITALS — BP 117/72 | HR 72 | Temp 97.9°F | Resp 16 | Ht 62.0 in | Wt 139.0 lb

## 2019-03-01 DIAGNOSIS — R197 Diarrhea, unspecified: Secondary | ICD-10-CM

## 2019-03-01 DIAGNOSIS — K30 Functional dyspepsia: Secondary | ICD-10-CM | POA: Diagnosis not present

## 2019-03-01 NOTE — Patient Instructions (Signed)
Constipation, Adult Constipation is when a person:  Poops (has a bowel movement) fewer times in a week than normal.  Has a hard time pooping.  Has poop that is dry, hard, or bigger than normal. Follow these instructions at home: Eating and drinking   Eat foods that have a lot of fiber, such as: ? Fresh fruits and vegetables. ? Whole grains. ? Beans.  Eat less of foods that are high in fat, low in fiber, or overly processed, such as: ? [place] fries. ? Hamburgers. ? Cookies. ? Candy. ? Soda.  Drink enough fluid to keep your pee (urine) clear or pale yellow. General instructions  Exercise regularly or as told by your doctor.  Go to the restroom when you feel like you need to poop. Do not hold it in.  Take over-the-counter and prescription medicines only as told by your doctor. These include any fiber supplements.  Do pelvic floor retraining exercises, such as: ? Doing deep breathing while relaxing your lower belly (abdomen). ? Relaxing your pelvic floor while pooping.  Watch your condition for any changes.  Keep all follow-up visits as told by your doctor. This is important. Contact a doctor if:  You have pain that gets worse.  You have a fever.  You have not pooped for 4 days.  You throw up (vomit).  You are not hungry.  You lose weight.  You are bleeding from the anus.  You have thin, pencil-like poop (stool). Get help right away if:  You have a fever, and your symptoms suddenly get worse.  You leak poop or have blood in your poop.  Your belly feels hard or bigger than normal (is bloated).  You have very bad belly pain.  You feel dizzy or you faint. This information is not intended to replace advice given to you by your health care provider. Make sure you discuss any questions you have with your health care provider. Document Released: 02/05/2008 Document Revised: 08/01/2017 Document Reviewed: 02/07/2016 Elsevier Patient Education  [place]. Diarrhea, Adult Diarrhea is when you pass loose and watery poop (stool) often. Diarrhea can make you feel weak and cause you to lose water in your body (get dehydrated). Losing water in your body can cause you to:  Feel tired and thirsty.  Have a dry mouth.  Go pee (urinate) less often. Diarrhea often lasts 2-3 days. However, it can last longer if it is a sign of something more serious. It is important to treat your diarrhea as told by your doctor. Follow these instructions at home: Eating and drinking     Follow these instructions as told by your doctor:  Take an ORS (oral rehydration solution). This is a drink that helps you replace fluids and minerals your body lost. It is sold at pharmacies and stores.  Drink plenty of fluids, such as: ? Water. ? Ice chips. ? Diluted fruit juice. ? Low-calorie sports drinks. ? Milk, if you want.  Avoid drinking fluids that have a lot of sugar or caffeine in them.  Eat bland, easy-to-digest foods in small amounts as you are able. These foods include: ? Bananas. ? Applesauce. ? Rice. ? Low-fat (lean) meats. ? Toast. ? Crackers.  Avoid alcohol.  Avoid spicy or fatty foods.  Medicines  Take over-the-counter and prescription medicines only as told by your doctor.  If you were prescribed an antibiotic medicine, take it as told by your doctor. Do not stop using the antibiotic even if you start to  feel better. General instructions   Wash your hands often using soap and water. If soap and water are not available, use a hand sanitizer. Others in your home should wash their hands as well. Hands should be washed: ? After using the toilet or changing a diaper. ? Before preparing, cooking, or serving food. ? While caring for a sick person. ? While visiting someone in a hospital.  Drink enough fluid to keep your pee (urine) pale yellow.  Rest at home while you get better.  Watch your condition for any changes.  Take a warm bath  to help with any burning or pain from having diarrhea.  Keep all follow-up visits as told by your doctor. This is important. Contact a doctor if:  You have a fever.  Your diarrhea gets worse.  You have new symptoms.  You cannot keep fluids down.  You feel light-headed or dizzy.  You have a headache.  You have muscle cramps. Get help right away if:  You have chest pain.  You feel very weak or you pass out (faint).  You have bloody or black poop or poop that looks like tar.  You have very bad pain, cramping, or bloating in your belly (abdomen).  You have trouble breathing or you are breathing very quickly.  Your heart is beating very quickly.  Your skin feels cold and clammy.  You feel confused.  You have signs of losing too much water in your body, such as: ? Dark pee, very little pee, or no pee. ? Cracked lips. ? Dry mouth. ? Sunken eyes. ? Sleepiness. ? Weakness. Summary  Diarrhea is when you pass loose and watery poop (stool) often.  Diarrhea can make you feel weak and cause you to lose water in your body (get dehydrated).  Take an ORS (oral rehydration solution). This is a drink that is sold at pharmacies and stores.  Eat bland, easy-to-digest foods in small amounts as you are able.  Contact a doctor if your condition gets worse. Get help right away if you have signs that you have lost too much water in your body. This information is not intended to replace advice given to you by your health care provider. Make sure you discuss any questions you have with your health care provider. Document Released: 02/05/2008 Document Revised: 01/23/2018 Document Reviewed: 01/23/2018 Elsevier Patient Education  2020 [employer].

## 2019-03-02 DIAGNOSIS — R197 Diarrhea, unspecified: Secondary | ICD-10-CM | POA: Diagnosis not present

## 2019-03-02 LAB — COMPREHENSIVE METABOLIC PANEL
ALT: 23 IU/L (ref 0–32)
AST: 20 IU/L (ref 0–40)
Albumin/Globulin Ratio: 2 (ref 1.2–2.2)
Albumin: 4.3 g/dL (ref 3.7–4.7)
Alkaline Phosphatase: 35 IU/L — ABNORMAL LOW (ref 39–117)
BUN/Creatinine Ratio: 14 (ref 12–28)
BUN: 8 mg/dL (ref 8–27)
Bilirubin Total: 0.2 mg/dL (ref 0.0–1.2)
CO2: 22 mmol/L (ref 20–29)
Calcium: 8.9 mg/dL (ref 8.7–10.3)
Chloride: 100 mmol/L (ref 96–106)
Creatinine, Ser: 0.59 mg/dL (ref 0.57–1.00)
GFR calc Af Amer: 104 mL/min/{1.73_m2} (ref >59)
GFR calc non Af Amer: 90 mL/min/{1.73_m2} (ref >59)
Globulin, Total: 2.1 g/dL (ref 1.5–4.5)
Glucose: 89 mg/dL (ref 65–99)
Potassium: 4.8 mmol/L (ref 3.5–5.2)
Sodium: 135 mmol/L (ref 134–144)
Total Protein: 6.4 g/dL (ref 6.0–8.5)

## 2019-03-02 LAB — LIPASE: Lipase: 26 U/L (ref 14–85)

## 2019-03-02 LAB — CBC WITH DIFFERENTIAL/PLATELET
Basophils Absolute: 0 10*3/uL (ref 0.0–0.2)
Basos: 1 %
EOS (ABSOLUTE): 0.1 10*3/uL (ref 0.0–0.4)
Eos: 2 %
Hematocrit: 34.2 % (ref 34.0–46.6)
Hemoglobin: 11.6 g/dL (ref 11.1–15.9)
Immature Grans (Abs): 0 10*3/uL (ref 0.0–0.1)
Immature Granulocytes: 0 %
Lymphocytes Absolute: 2.6 10*3/uL (ref 0.7–3.1)
Lymphs: 57 %
MCH: 31.9 pg (ref 26.6–33.0)
MCHC: 33.9 g/dL (ref 31.5–35.7)
MCV: 94 fL (ref 79–97)
Monocytes Absolute: 0.4 10*3/uL (ref 0.1–0.9)
Monocytes: 8 %
Neutrophils Absolute: 1.5 10*3/uL (ref 1.4–7.0)
Neutrophils: 32 %
Platelets: 305 10*3/uL (ref 150–450)
RBC: 3.64 x10E6/uL — ABNORMAL LOW (ref 3.77–5.28)
RDW: 12.2 % (ref 11.7–15.4)
WBC: 4.5 10*3/uL (ref 3.4–10.8)

## 2019-03-08 LAB — CDIFF NAA+O+P+STOOL CULTURE
E coli, Shiga toxin Assay: NEGATIVE
Toxigenic C. Difficile by PCR: NEGATIVE

## 2019-03-09 ENCOUNTER — Encounter: Payer: Self-pay | Admitting: Physician Assistant

## 2019-03-09 ENCOUNTER — Other Ambulatory Visit: Payer: Self-pay

## 2019-03-09 ENCOUNTER — Ambulatory Visit (INDEPENDENT_AMBULATORY_CARE_PROVIDER_SITE_OTHER): Payer: PPO | Admitting: Physician Assistant

## 2019-03-09 VITALS — BP 119/78 | HR 92 | Temp 97.5°F | Resp 16 | Wt 137.0 lb

## 2019-03-09 DIAGNOSIS — R197 Diarrhea, unspecified: Secondary | ICD-10-CM | POA: Diagnosis not present

## 2019-03-09 DIAGNOSIS — K5900 Constipation, unspecified: Secondary | ICD-10-CM

## 2019-03-09 NOTE — Progress Notes (Signed)
Patient: Sarah Todd Female    DOB: 1946/06/26   73 y.o.   MRN: [id number] Visit Date: 03/09/2019  Today's Provider: Mar Daring, PA-C   No chief complaint on file.  Subjective:     HPI Patient here for 1 week f/u diarrhea and constipation. Patient was advised to try stool softener, dulcolax or miralax to help with BM. Reports that she is doing much better. She is using Align probiotic and reports much improvements. Labs and stool studies were normal.   Allergies  Allergen Reactions   Lisinopril Anaphylaxis   Shrimp [Shellfish Allergy] Anaphylaxis   Aspercreme [Trolamine Salicylate] Rash     Current Outpatient Medications:    acetaminophen (TYLENOL) 650 MG CR tablet, Take 650 mg by mouth every 4 (four) hours as needed for pain., Disp: , Rfl:    aspirin 81 MG tablet, Take 81 mg by mouth daily. , Disp: , Rfl:    Calcium Citrate 200 MG TABS, Take 1 tablet by mouth daily. , Disp: , Rfl:    Cholecalciferol (VITAMIN D-3 PO), Take 1,000 Units by mouth daily. AM, Disp: , Rfl:    DICLOFENAC PO, Take by mouth 2 (two) times a day., Disp: , Rfl:    estradiol (ESTRACE) 0.5 MG tablet, TAKE ONE TABLET EVERY DAY, Disp: 90 tablet, Rfl: 1   gabapentin (NEURONTIN) 600 MG tablet, Take 600 mg by mouth 2 (two) times daily. 600 mg in AM and 900 mg in PM, Disp: , Rfl:    losartan (COZAAR) 50 MG tablet, TAKE ONE TABLET BY MOUTH EVERY MORNING, Disp: 90 tablet, Rfl: 1   mometasone (ELOCON) 0.1 % cream, Apply 1 application topically daily. As needed, Disp: , Rfl:    MOMETASONE FUROATE EX, Apply topically. Solution to use as needed, Disp: , Rfl:    Multiple Vitamin (MULTIVITAMIN) capsule, Take 1 capsule by mouth daily. AM, Disp: , Rfl:    Omega 3 1000 MG CAPS, Take 1,000 mg by mouth daily. AM, Disp: , Rfl:    vitamin C (ASCORBIC ACID) 500 MG tablet, Take by mouth., Disp: , Rfl:    Famotidine (PEPCID PO), Take by mouth., Disp: , Rfl:    hydrocortisone 2.5 % cream, Apply  topically 2 (two) times daily. (Patient not taking: Reported on 03/09/2019), Disp: 30 g, Rfl: 0   Omeprazole (PRILOSEC PO), Take by mouth., Disp: , Rfl:   Review of Systems  Constitutional: Negative.   Respiratory: Negative.   Cardiovascular: Negative.   Gastrointestinal: Negative.   Genitourinary: Negative.     Social History   Tobacco Use   Smoking status: Never Smoker   Smokeless tobacco: Never Used  Substance Use Topics   Alcohol use: No      Objective:   BP 119/78 (BP Location: Left Arm, Patient Position: Sitting, Cuff Size: Large)    Pulse 92    Temp (!) 97.5 F (36.4 C) (Oral)    Resp 16    Wt 137 lb (62.1 kg)    BMI 25.06 kg/m  Vitals:   03/09/19 1336  BP: 119/78  Pulse: 92  Resp: 16  Temp: (!) 97.5 F (36.4 C)  TempSrc: Oral  Weight: 137 lb (62.1 kg)     Physical Exam Constitutional:      General: She is not in acute distress.    Appearance: Normal appearance. She is well-developed. She is not ill-appearing or diaphoretic.  Cardiovascular:     Rate and Rhythm: Normal rate and regular rhythm.  Heart sounds: Normal heart sounds. No murmur. No friction rub. No gallop.   Pulmonary:     Effort: Pulmonary effort is normal. No respiratory distress.     Breath sounds: Normal breath sounds. No wheezing or rales.  Abdominal:     General: Bowel sounds are normal. There is no distension.     Palpations: Abdomen is soft. There is no mass.     Tenderness: There is no abdominal tenderness. There is no guarding or rebound.  Skin:    General: Skin is warm and dry.  Neurological:     Mental Status: She is alert and oriented to person, place, and time.      No results found for any visits on 03/09/19.     Assessment & Plan    1. Constipation, unspecified constipation type Improved. Will use Miralax prn.   2. Diarrhea, unspecified type Resolved.      Mar Daring, PA-C  [facility]

## 2019-03-22 DIAGNOSIS — M47816 Spondylosis without myelopathy or radiculopathy, lumbar region: Secondary | ICD-10-CM | POA: Diagnosis not present

## 2019-03-24 DIAGNOSIS — M5126 Other intervertebral disc displacement, lumbar region: Secondary | ICD-10-CM | POA: Diagnosis not present

## 2019-03-24 DIAGNOSIS — M4807 Spinal stenosis, lumbosacral region: Secondary | ICD-10-CM | POA: Diagnosis not present

## 2019-03-24 DIAGNOSIS — M47816 Spondylosis without myelopathy or radiculopathy, lumbar region: Secondary | ICD-10-CM | POA: Diagnosis not present

## 2019-03-29 DIAGNOSIS — G8929 Other chronic pain: Secondary | ICD-10-CM | POA: Diagnosis not present

## 2019-03-29 DIAGNOSIS — M7061 Trochanteric bursitis, right hip: Secondary | ICD-10-CM | POA: Diagnosis not present

## 2019-03-29 DIAGNOSIS — M5441 Lumbago with sciatica, right side: Secondary | ICD-10-CM | POA: Diagnosis not present

## 2019-03-29 DIAGNOSIS — M4155 Other secondary scoliosis, thoracolumbar region: Secondary | ICD-10-CM | POA: Diagnosis not present

## 2019-03-29 DIAGNOSIS — Z6825 Body mass index (BMI) 25.0-25.9, adult: Secondary | ICD-10-CM | POA: Diagnosis not present

## 2019-03-29 DIAGNOSIS — M5136 Other intervertebral disc degeneration, lumbar region: Secondary | ICD-10-CM | POA: Diagnosis not present

## 2019-03-29 DIAGNOSIS — M7062 Trochanteric bursitis, left hip: Secondary | ICD-10-CM | POA: Diagnosis not present

## 2019-03-29 DIAGNOSIS — M47816 Spondylosis without myelopathy or radiculopathy, lumbar region: Secondary | ICD-10-CM | POA: Diagnosis not present

## 2019-03-29 DIAGNOSIS — I10 Essential (primary) hypertension: Secondary | ICD-10-CM | POA: Diagnosis not present

## 2019-04-01 ENCOUNTER — Other Ambulatory Visit: Payer: Self-pay | Admitting: Physician Assistant

## 2019-04-01 DIAGNOSIS — I10 Essential (primary) hypertension: Secondary | ICD-10-CM

## 2019-04-19 ENCOUNTER — Telehealth: Payer: Self-pay | Admitting: Physician Assistant

## 2019-04-19 NOTE — Telephone Encounter (Signed)
Yes she can take 600mg  ibuprofen (advil) three times daily. Heat to the muscle and light stretches.

## 2019-04-19 NOTE — Telephone Encounter (Signed)
Pt thinks she pulled a muscle in her shoulder saturday,  She has been taking advil but wants to know if she needs to do something else.  She has an appt Friday for a CPE and does not want to come in earlier if she does not need to.  Call back is [phone]  Con Memos

## 2019-04-19 NOTE — Telephone Encounter (Signed)
Patient advised as directed below. 

## 2019-04-21 ENCOUNTER — Other Ambulatory Visit: Payer: Self-pay

## 2019-04-21 ENCOUNTER — Emergency Department
Admission: EM | Admit: 2019-04-21 | Discharge: 2019-04-21 | Disposition: A | Discharge status: Home / Self Care | Payer: PPO | Attending: Emergency Medicine | Admitting: Emergency Medicine

## 2019-04-21 ENCOUNTER — Emergency Department: Payer: PPO

## 2019-04-21 DIAGNOSIS — Z7982 Long term (current) use of aspirin: Secondary | ICD-10-CM | POA: Diagnosis not present

## 2019-04-21 DIAGNOSIS — Z79899 Other long term (current) drug therapy: Secondary | ICD-10-CM | POA: Diagnosis not present

## 2019-04-21 DIAGNOSIS — M25512 Pain in left shoulder: Secondary | ICD-10-CM | POA: Insufficient documentation

## 2019-04-21 DIAGNOSIS — I10 Essential (primary) hypertension: Secondary | ICD-10-CM | POA: Diagnosis not present

## 2019-04-21 IMAGING — CR LEFT SHOULDER - 2+ VIEW
1 series · 3 of 3 positions shown · non-contrast
Comparison: None.

CLINICAL DATA: 73-year-old female with 5 days of left shoulder
pain.

EXAM:
LEFT SHOULDER - 2+ VIEW

[Series 1: dg shoulder left · 0.14mm/px · 3 of 3 slices shown]
[im 1/3]
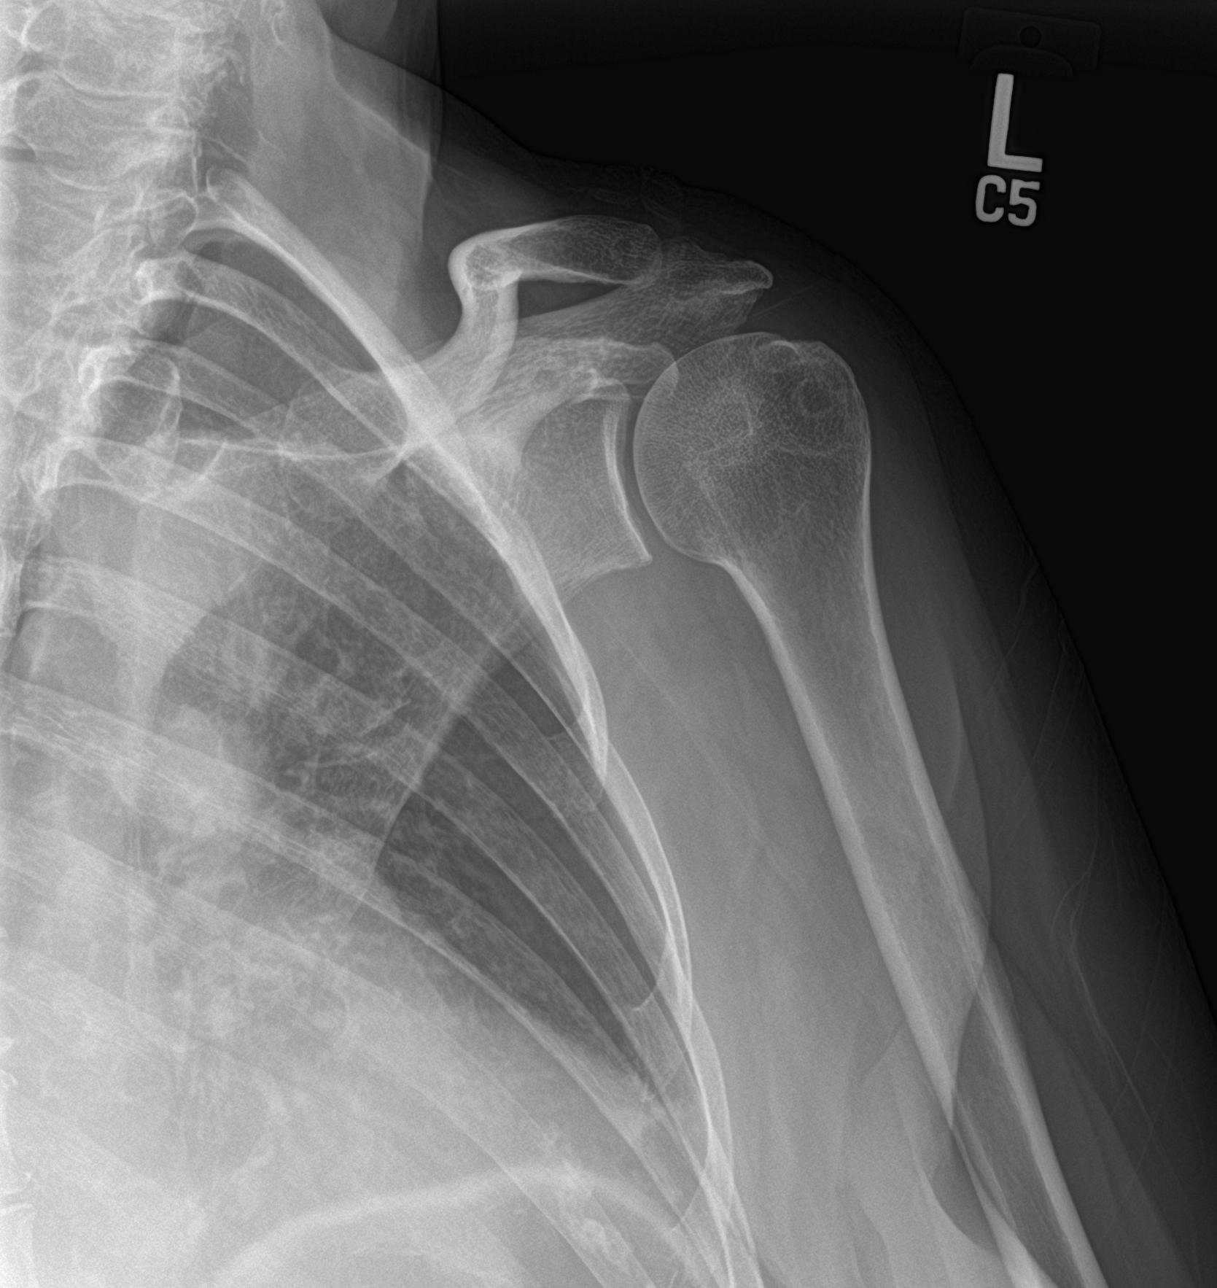
[im 2/3]
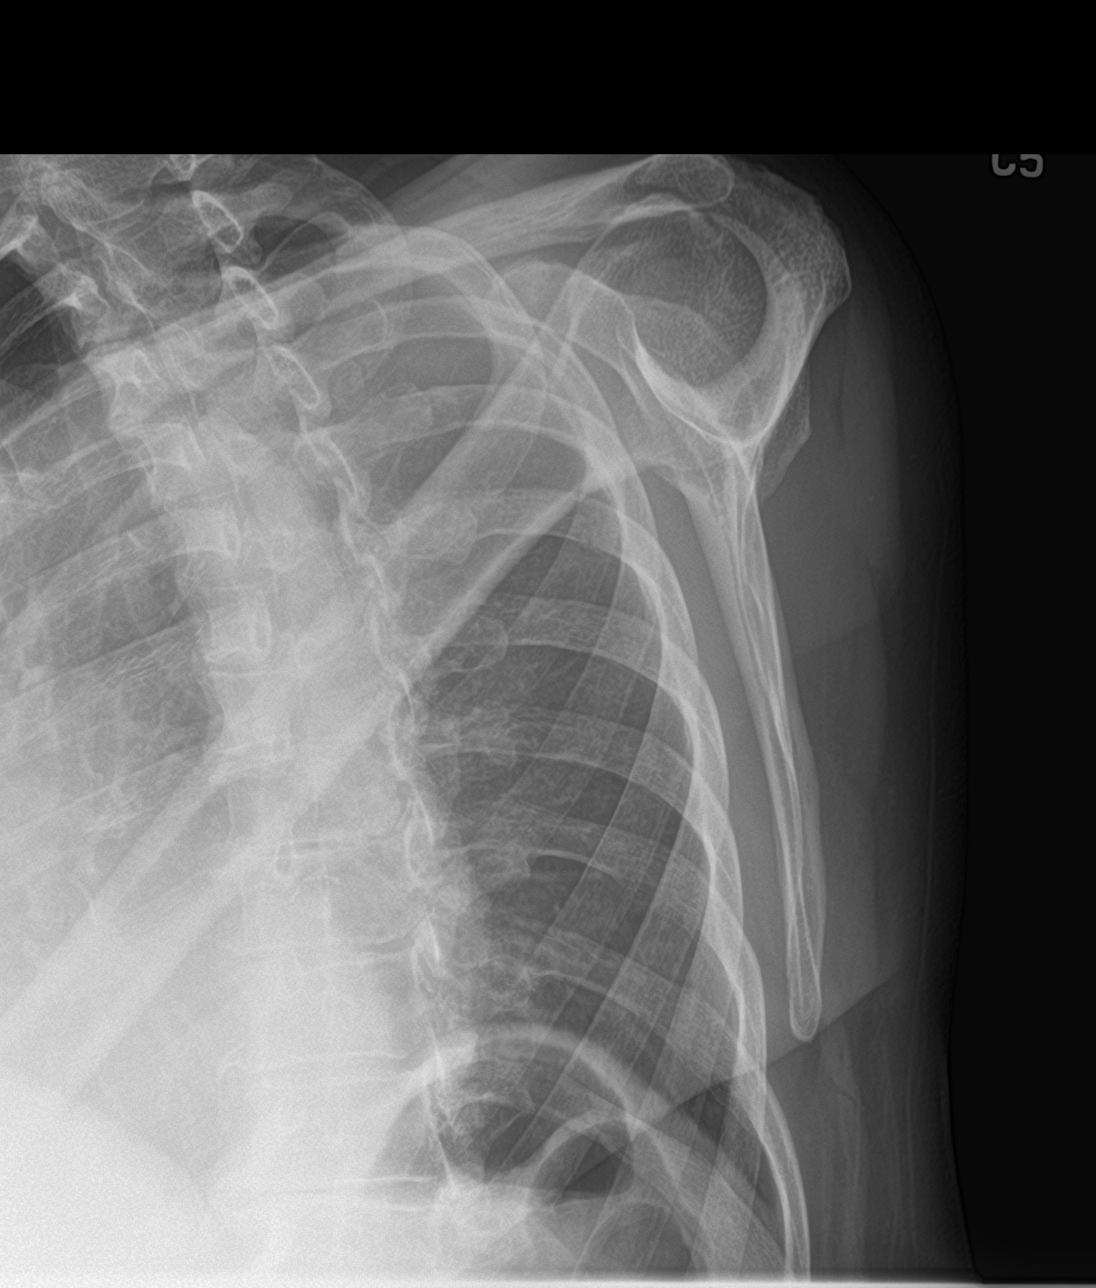
[im 3/3]
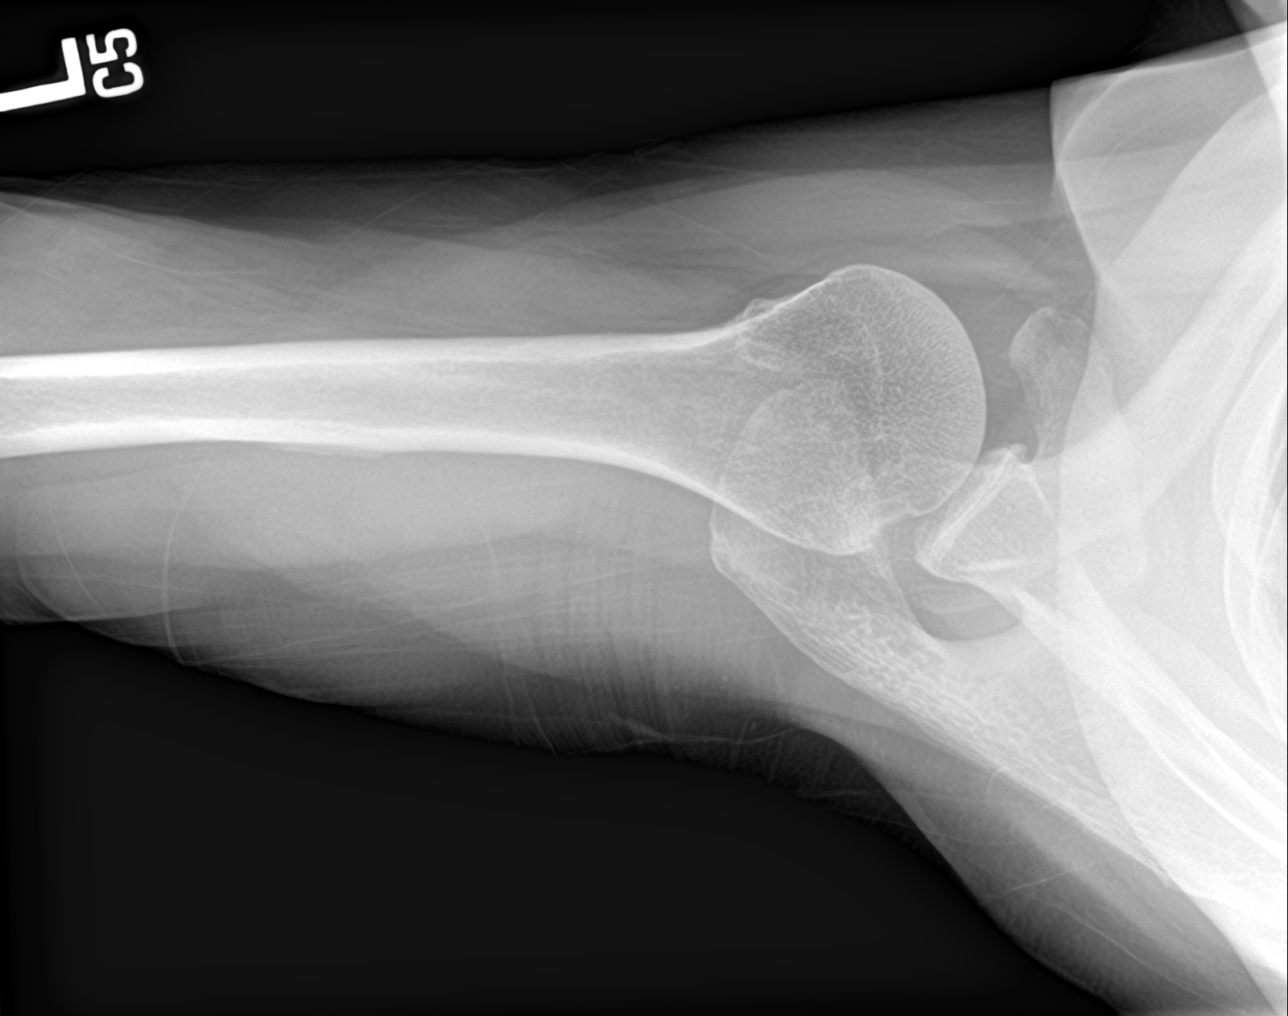

[3 of 3 positions shown; findings below may reference images not displayed]

FINDINGS: There is no acute fracture or dislocation. There is slight
fragmentation of the anterior bony glenoid, likely chronic. Mild
osteopenia. The soft tissues are unremarkable.
IMPRESSION: 1. No acute fracture or dislocation.
2. Slight fragmentation of the anterior bony glenoid, chronic
appearing.

## 2019-04-21 MED ORDER — LIDOCAINE 5 % EX PTCH: 1.0000 | MEDICATED_PATCH | CUTANEOUS | 0 refills | Status: DC

## 2019-04-21 MED ORDER — TRAMADOL HCL 50 MG PO TABS: 50.0000 mg | ORAL_TABLET | Freq: Every day | ORAL | 0 refills | Status: AC

## 2019-04-21 MED ORDER — LIDOCAINE 5 % EX PTCH
1.0000 | MEDICATED_PATCH | CUTANEOUS | Status: DC
  Administered 2019-04-21: 1 via TRANSDERMAL
  Filled 2019-04-21: qty 1

## 2019-04-21 MED ORDER — TRAMADOL HCL 50 MG PO TABS
50.0000 mg | ORAL_TABLET | Freq: Once | ORAL | Status: AC
  Administered 2019-04-21: 50 mg via ORAL
  Filled 2019-04-21: qty 1

## 2019-04-21 NOTE — ED Triage Notes (Signed)
Left shoulder pain after reaching above for a box on Saturday. Pain gone when arm at rest, severe when shoulder used. Pt alert and oriented X4, cooperative, RR even and unlabored, color WNL. Pt in NAD.

## 2019-04-21 NOTE — Discharge Instructions (Addendum)
Advised to use Lidoderm patch during the day and take tramadol at night before sleeping.  Follow-up with orthopedic if no improvement in 5 days.

## 2019-04-21 NOTE — ED Notes (Signed)
See triage note  Presents with left shoulder pain  States  She did not fall but tried to pull down her hatch  And felt pain to area 2 days ago

## 2019-04-21 NOTE — ED Provider Notes (Signed)
[facility] Emergency Department Provider Note   ____________________________________________   First MD Initiated Contact with Patient 04/21/19 [phone]     (approximate)  I have reviewed the triage vital signs and the nursing notes.   HISTORY  Chief Complaint Shoulder Injury    HPI Sarah Todd is a 73 y.o. female patient complain of left shoulder pain increasing over the past 5 days.  Incident occurred secondary patient trying to close tailgate of vehicle.  Pain increased with adduction and overhead reaching.  Patient rates the pain is 8/10.  Patient described pain as "achy".  No palliative measure for complaint.      Past Medical History:  Diagnosis Date  . Allergy   . Arthritis    hands  . Hypertension   . Osteoporosis   . Varicose veins    Mild    Patient Active Problem List   Diagnosis Date Noted  . Family history of colon cancer in mother 12/18/2017  . Pain in elbow 11/07/2016  . Candidiasis of breast 07/20/2015  . Atrophic vaginitis 07/20/2015  . Constipation 06/20/2015  . Skin lesion of face 06/20/2015  . Tinea corporis 05/19/2015  . Allergic rhinitis 02/06/2015  . Arthritis 02/06/2015  . BP (high blood pressure) 02/06/2015  . OP (osteoporosis) 02/06/2015  . Shoulder strain 02/06/2015  . Dermatophytosis of groin 02/06/2015  . Candida vaginitis 02/06/2015    Past Surgical History:  Procedure Laterality Date  . ABDOMINAL HYSTERECTOMY    . BACK SURGERY    . COLONOSCOPY N/A 01/05/2015   Procedure: COLONOSCOPY;  Surgeon: Lucilla Lame, MD;  Location: [place];  Service: Gastroenterology;  Laterality: N/A;  . DILATION AND CURETTAGE OF UTERUS    . TUBAL LIGATION      Prior to Admission medications   Medication Sig Start Date End Date Taking? Authorizing Provider  acetaminophen (TYLENOL) 650 MG CR tablet Take 650 mg by mouth every 4 (four) hours as needed for pain.    [provider]  aspirin 81 MG tablet  Take 81 mg by mouth daily.     [provider]  Calcium Citrate 200 MG TABS Take 1 tablet by mouth daily.     [provider]  Cholecalciferol (VITAMIN D-3 PO) Take 1,000 Units by mouth daily. AM    [provider]  DICLOFENAC PO Take by mouth 2 (two) times a day.    [provider]  estradiol (ESTRACE) 0.5 MG tablet TAKE ONE TABLET EVERY DAY 11/23/18   Mar Daring, PA-C  Famotidine (PEPCID PO) Take by mouth.    [provider]  gabapentin (NEURONTIN) 600 MG tablet Take 600 mg by mouth 2 (two) times daily. 600 mg in AM and 900 mg in PM    [provider]  hydrocortisone 2.5 % cream Apply topically 2 (two) times daily. Patient not taking: Reported on 03/09/2019 02/10/19   Mar Daring, PA-C  lidocaine (LIDODERM) 5 % Place 1 patch onto the skin daily. Remove & Discard patch within 12 hours or as directed by MD 04/21/19 04/20/20  Sable Feil, PA-C  losartan (COZAAR) 50 MG tablet TAKE ONE TABLET BY MOUTH EVERY MORNING 04/02/19   Fenton Malling M, PA-C  mometasone (ELOCON) 0.1 % cream Apply 1 application topically daily. As needed    [provider]  MOMETASONE FUROATE EX Apply topically. Solution to use as needed    [provider]  Multiple Vitamin (MULTIVITAMIN) capsule Take 1 capsule by mouth  daily. AM    [provider]  Omega 3 1000 MG CAPS Take 1,000 mg by mouth daily. AM    [provider]  Omeprazole (PRILOSEC PO) Take by mouth.    [provider]  traMADol (ULTRAM) 50 MG tablet Take 1 tablet (50 mg total) by mouth at bedtime for 12 days. 04/21/19 05/03/19  Sable Feil, PA-C  vitamin C (ASCORBIC ACID) 500 MG tablet Take by mouth.    [provider]    Allergies Lisinopril, Shrimp [shellfish allergy], and Aspercreme [trolamine salicylate]  Family History  Problem Relation Age of Onset  . Cancer Mother        colon cancer  . Alzheimer's disease Father   . Heart  disease Father   . Hypertension Father   . Cancer Sister        uterine  . Breast cancer Neg Hx     Social History Social History   Tobacco Use  . Smoking status: Never Smoker  . Smokeless tobacco: Never Used  Substance Use Topics  . Alcohol use: No  . Drug use: No    Review of Systems Constitutional: No fever/chills Eyes: No visual changes. ENT: No sore throat. Cardiovascular: Denies chest pain. Respiratory: Denies shortness of breath. Gastrointestinal: No abdominal pain.  No nausea, no vomiting.  No diarrhea.  No constipation. Genitourinary: Negative for dysuria. Musculoskeletal: Left shoulder pain. Skin: Negative for rash. Neurological: Negative for headaches, focal weakness or numbness. Endocrine:  Hypertension Allergic/Immunilogical: See allergy list.  ____________________________________________   PHYSICAL EXAM:  VITAL SIGNS: ED Triage Vitals [04/21/19 0905]  Enc Vitals Group     BP (!) 156/93     Pulse Rate (!) 107     Resp 18     Temp 98 F (36.7 C)     Temp Source Oral     SpO2 97 %     Weight 140 lb (63.5 kg)     Height 5\' 4"  (1.626 m)     Head Circumference      Peak Flow      Pain Score 8     Pain Loc      Pain Edu?      Excl. in [place]?     Constitutional: Alert and oriented. Well appearing and in no acute distress. Neck:No cervical spine tenderness to palpation. Hematological/Lymphatic/Immunilogical: No cervical lymphadenopathy. Cardiovascular: Normal rate, regular rhythm. Grossly normal heart sounds.  Good peripheral circulation. Respiratory: Normal respiratory effort.  No retractions. Lungs CTAB. Musculoskeletal: No obvious deformity to the left shoulder.  Decreased range of motion with adduction overhead reaching.  Neurologic:  Normal speech and language. No gross focal neurologic deficits are appreciated. No gait instability. Skin:  Skin is warm, dry and intact. No rash noted. Psychiatric: Mood and affect are normal. Speech and behavior  are normal.  ____________________________________________   LABS (all labs ordered are listed, but only abnormal results are displayed)  Labs Reviewed - No data to display ____________________________________________  EKG   ____________________________________________  RADIOLOGY  ED MD interpretation:    Official radiology report(s): Dg Shoulder Left  Result Date: 04/21/2019 CLINICAL DATA:  73 year old female with 5 days of left shoulder pain. EXAM: LEFT SHOULDER - 2+ VIEW COMPARISON:  None. FINDINGS: There is no acute fracture or dislocation. There is slight fragmentation of the anterior bony glenoid, likely chronic. Mild osteopenia. The soft tissues are unremarkable. IMPRESSION: 1. No acute fracture or dislocation. 2. Slight fragmentation of the anterior bony glenoid, chronic appearing. Electronically Signed  By: Anner Crete M.D.   On: 04/21/2019 09:58    ____________________________________________   PROCEDURES  Procedure(s) performed (including Critical Care):  Procedures   ____________________________________________   INITIAL IMPRESSION / ASSESSMENT AND PLAN / ED COURSE  As part of my medical decision making, I reviewed the following data within the [place] was evaluated in Emergency Department on 04/21/2019 for the symptoms described in the history of present illness. She was evaluated in the context of the global COVID-19 pandemic, which necessitated consideration that the patient might be at risk for infection with the SARS-CoV-2 virus that causes COVID-19. Institutional protocols and algorithms that pertain to the evaluation of patients at risk for COVID-19 are in a state of rapid change based on information released by regulatory bodies including the CDC and federal and state organizations. These policies and algorithms were followed during the patient's care in the ED.    Patient presents with 2 days of  left shoulder pain secondary to overhead reaching incident.  Physical exam was remarkable for decreased range of motion abduction overhead reaching.  Discussed x-ray findings with patient.  Patient placed in arm sling and given discharge care instruction.  Advised follow-up with orthopedic 5 days if no improvement or worsening of complaint.   ____________________________________________   FINAL CLINICAL IMPRESSION(S) / ED DIAGNOSES  Final diagnoses:  Acute pain of left shoulder     ED Discharge Orders         Ordered    lidocaine (LIDODERM) 5 %  Every 24 hours     04/21/19 1015    traMADol (ULTRAM) 50 MG tablet  Daily at bedtime     04/21/19 1015           Note:  This document was prepared using Dragon voice recognition software and may include unintentional dictation errors.    Sable Feil, PA-C 04/21/19 1017    Earleen Newport, MD 04/21/19 1029

## 2019-04-23 ENCOUNTER — Encounter: Payer: PPO | Admitting: Physician Assistant

## 2019-04-23 ENCOUNTER — Telehealth: Payer: Self-pay | Admitting: Physician Assistant

## 2019-04-23 NOTE — Telephone Encounter (Signed)
Pt called wanting to know if she can take OTC pain meds with the prescriptions she is taking.  CB#  [phone] or [phone]  Con Memos

## 2019-04-23 NOTE — Telephone Encounter (Signed)
LMOVM for pt to return call 

## 2019-04-23 NOTE — Telephone Encounter (Signed)
She can take tylenol, that would be the safest. But if she needs she could do ibuprofen or aleve short term. Would worry about kidneys as they are both excreted through the kidneys if needed long term

## 2019-04-26 DIAGNOSIS — M7542 Impingement syndrome of left shoulder: Secondary | ICD-10-CM | POA: Diagnosis not present

## 2019-04-28 NOTE — Telephone Encounter (Signed)
Patient advised as directed below. 

## 2019-04-29 DIAGNOSIS — M25612 Stiffness of left shoulder, not elsewhere classified: Secondary | ICD-10-CM | POA: Diagnosis not present

## 2019-04-29 DIAGNOSIS — M25512 Pain in left shoulder: Secondary | ICD-10-CM | POA: Diagnosis not present

## 2019-05-04 DIAGNOSIS — M25512 Pain in left shoulder: Secondary | ICD-10-CM | POA: Diagnosis not present

## 2019-05-04 DIAGNOSIS — M25612 Stiffness of left shoulder, not elsewhere classified: Secondary | ICD-10-CM | POA: Diagnosis not present

## 2019-05-06 DIAGNOSIS — M25612 Stiffness of left shoulder, not elsewhere classified: Secondary | ICD-10-CM | POA: Diagnosis not present

## 2019-05-06 DIAGNOSIS — M25512 Pain in left shoulder: Secondary | ICD-10-CM | POA: Diagnosis not present

## 2019-05-11 DIAGNOSIS — M25612 Stiffness of left shoulder, not elsewhere classified: Secondary | ICD-10-CM | POA: Diagnosis not present

## 2019-05-11 DIAGNOSIS — M25512 Pain in left shoulder: Secondary | ICD-10-CM | POA: Diagnosis not present

## 2019-05-13 DIAGNOSIS — M25612 Stiffness of left shoulder, not elsewhere classified: Secondary | ICD-10-CM | POA: Diagnosis not present

## 2019-05-13 DIAGNOSIS — M25512 Pain in left shoulder: Secondary | ICD-10-CM | POA: Diagnosis not present

## 2019-05-20 DIAGNOSIS — M25612 Stiffness of left shoulder, not elsewhere classified: Secondary | ICD-10-CM | POA: Diagnosis not present

## 2019-05-20 DIAGNOSIS — M25512 Pain in left shoulder: Secondary | ICD-10-CM | POA: Diagnosis not present

## 2019-05-24 DIAGNOSIS — M5416 Radiculopathy, lumbar region: Secondary | ICD-10-CM | POA: Diagnosis not present

## 2019-05-24 DIAGNOSIS — S46912A Strain of unspecified muscle, fascia and tendon at shoulder and upper arm level, left arm, initial encounter: Secondary | ICD-10-CM | POA: Diagnosis not present

## 2019-06-01 DIAGNOSIS — M545 Low back pain: Secondary | ICD-10-CM | POA: Diagnosis not present

## 2019-06-01 DIAGNOSIS — M25512 Pain in left shoulder: Secondary | ICD-10-CM | POA: Diagnosis not present

## 2019-06-04 DIAGNOSIS — M25512 Pain in left shoulder: Secondary | ICD-10-CM | POA: Diagnosis not present

## 2019-06-04 DIAGNOSIS — M545 Low back pain: Secondary | ICD-10-CM | POA: Diagnosis not present

## 2019-06-11 ENCOUNTER — Encounter: Payer: PPO | Admitting: Physician Assistant

## 2019-06-11 DIAGNOSIS — M545 Low back pain: Secondary | ICD-10-CM | POA: Diagnosis not present

## 2019-06-11 DIAGNOSIS — M7542 Impingement syndrome of left shoulder: Secondary | ICD-10-CM | POA: Diagnosis not present

## 2019-06-11 DIAGNOSIS — M25512 Pain in left shoulder: Secondary | ICD-10-CM | POA: Diagnosis not present

## 2019-06-14 DIAGNOSIS — M25512 Pain in left shoulder: Secondary | ICD-10-CM | POA: Diagnosis not present

## 2019-06-14 DIAGNOSIS — M545 Low back pain: Secondary | ICD-10-CM | POA: Diagnosis not present

## 2019-06-16 DIAGNOSIS — M25512 Pain in left shoulder: Secondary | ICD-10-CM | POA: Diagnosis not present

## 2019-06-16 DIAGNOSIS — M545 Low back pain: Secondary | ICD-10-CM | POA: Diagnosis not present

## 2019-06-18 DIAGNOSIS — M542 Cervicalgia: Secondary | ICD-10-CM | POA: Insufficient documentation

## 2019-06-18 DIAGNOSIS — M5412 Radiculopathy, cervical region: Secondary | ICD-10-CM | POA: Diagnosis not present

## 2019-06-21 DIAGNOSIS — M5416 Radiculopathy, lumbar region: Secondary | ICD-10-CM | POA: Insufficient documentation

## 2019-06-22 ENCOUNTER — Other Ambulatory Visit: Payer: Self-pay | Admitting: Orthopedic Surgery

## 2019-06-22 DIAGNOSIS — M5412 Radiculopathy, cervical region: Secondary | ICD-10-CM

## 2019-06-26 ENCOUNTER — Other Ambulatory Visit: Payer: Self-pay

## 2019-06-26 ENCOUNTER — Ambulatory Visit
Admission: RE | Admit: 2019-06-26 | Discharge: 2019-06-26 | Disposition: A | Discharge status: Home / Self Care | Payer: PPO | Source: Ambulatory Visit | Attending: Orthopedic Surgery | Admitting: Orthopedic Surgery

## 2019-06-26 DIAGNOSIS — M5412 Radiculopathy, cervical region: Secondary | ICD-10-CM | POA: Diagnosis not present

## 2019-06-26 DIAGNOSIS — M4802 Spinal stenosis, cervical region: Secondary | ICD-10-CM | POA: Diagnosis not present

## 2019-06-26 IMAGING — MR MR CERVICAL SPINE W/O CM
5 series · 38 of 48 positions shown · non-contrast
Comparison: None.

CLINICAL DATA: Left arm and shoulder pain for the past 2 months.

EXAM:
MRI CERVICAL SPINE WITHOUT CONTRAST
TECHNIQUE: Multiplanar, multisequence MR imaging of the cervical spine was
performed. No intravenous contrast was administered.

[Series 5: T2 · sagittal · 3.0mm · 0.62mm/px · 7 of 15 slices shown (1 of 2)]
[im 1/15]
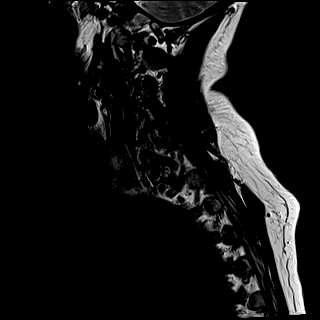
[im 3/15]
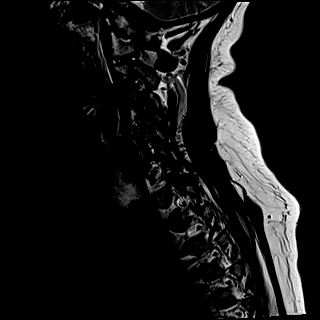
[im 5/15]
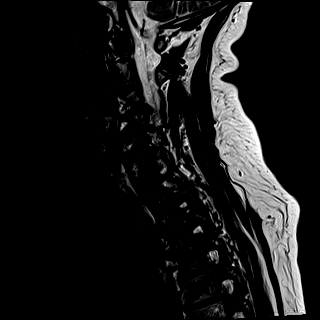
[im 8/15]
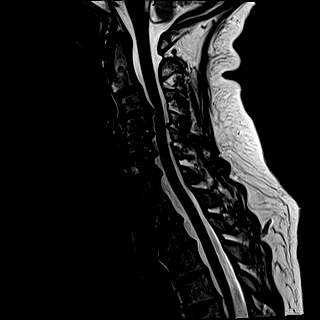
[im 10/15]
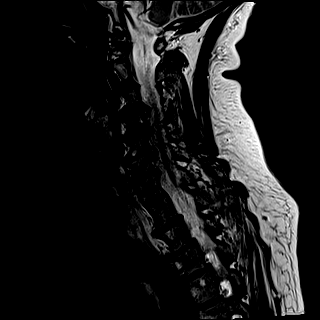
[im 12/15]
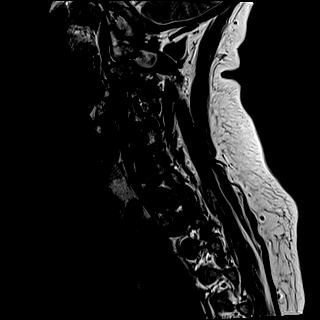
[im 15/15]
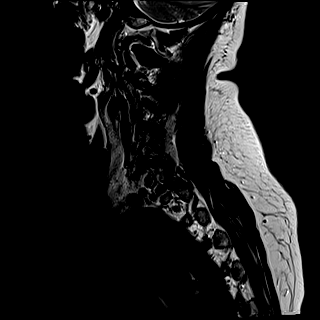

[Series 6: FLAIR · sagittal · 3.0mm · 0.78mm/px · 7 of 15 slices shown]
[im 1/15]
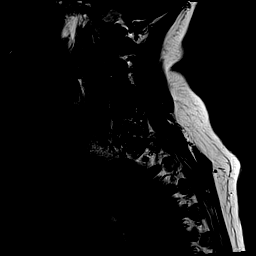
[im 3/15]
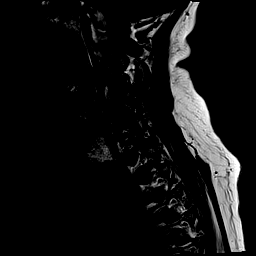
[im 5/15]
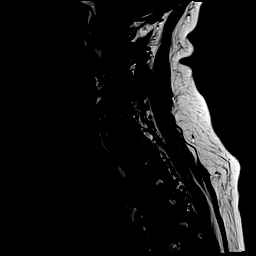
[im 8/15]
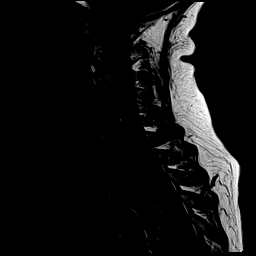
[im 10/15]
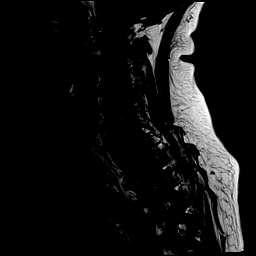
[im 12/15]
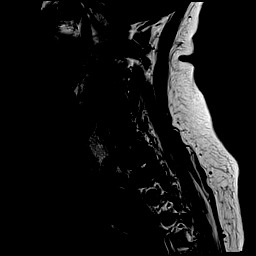
[im 15/15]
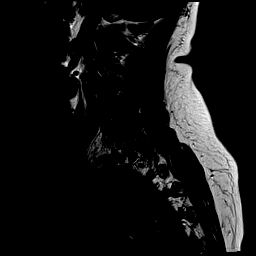

[Series 7: STIR · sagittal · 3.0mm · 0.62mm/px · 8 of 15 slices shown]
[im 1/15]
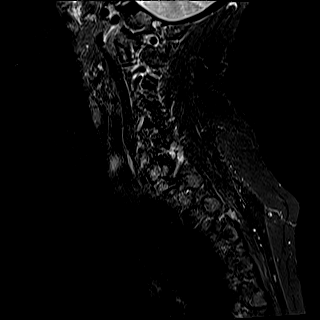
[im 3/15]
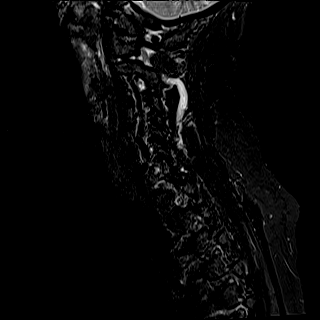
[im 5/15]
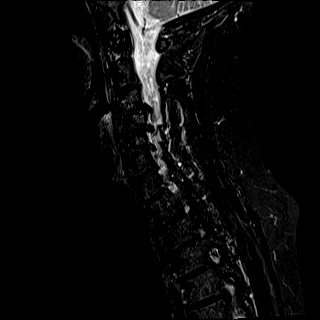
[im 7/15]
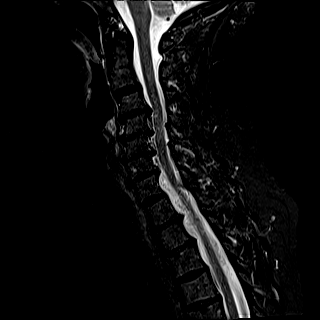
[im 9/15]
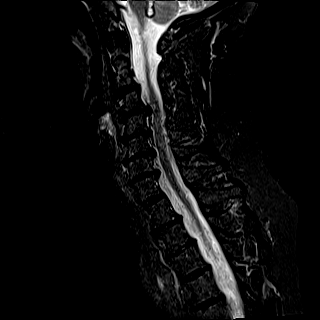
[im 11/15]
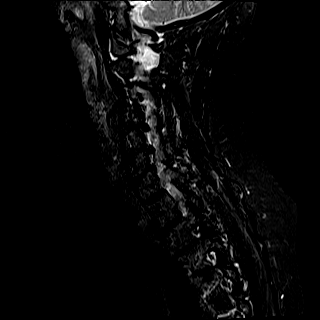
[im 13/15]
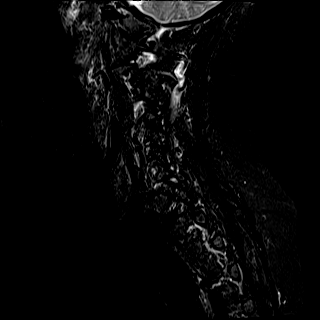
[im 15/15]
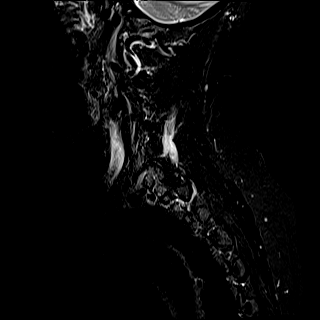

[Series 8: T2 · axial · 3.0mm · 0.70mm/px · z∈[-158,-77]mm · 9 of 25 slices shown (2 of 2)]
[im 1/25]
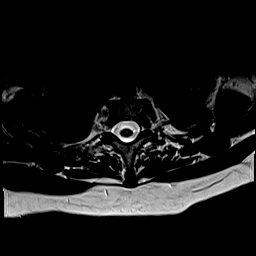
[im 5/25]
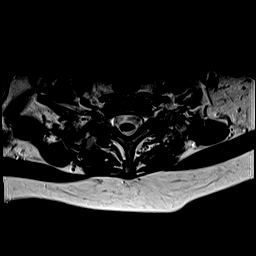
[im 9/25]
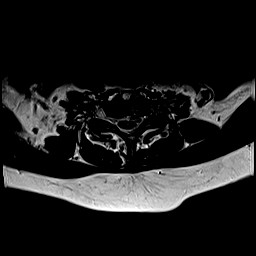
[im 11/25]
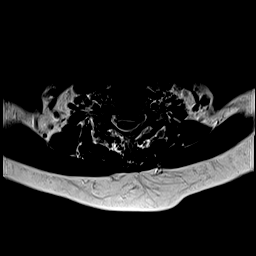
[im 13/25]
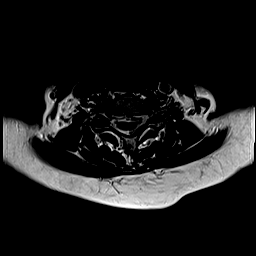
[im 15/25]
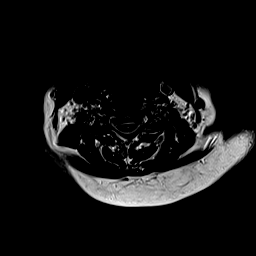
[im 17/25]
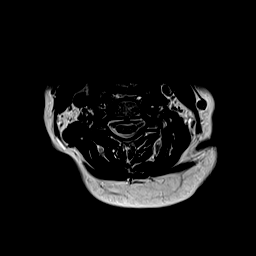
[im 21/25]
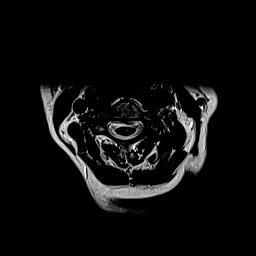
[im 25/25]
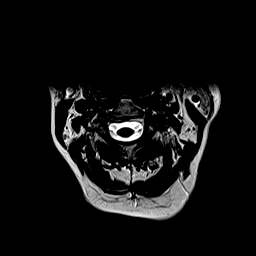

[Series 9: ax mpgr · axial · 3.0mm · 0.35mm/px · z∈[-158,-90]mm · 7 of 25 slices shown]
[im 1/25]
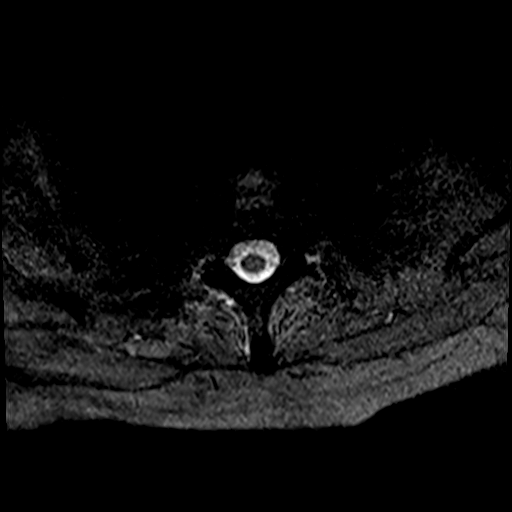
[im 5/25]
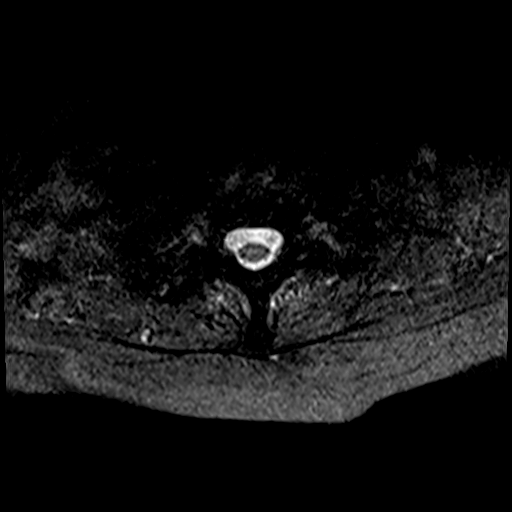
[im 9/25]
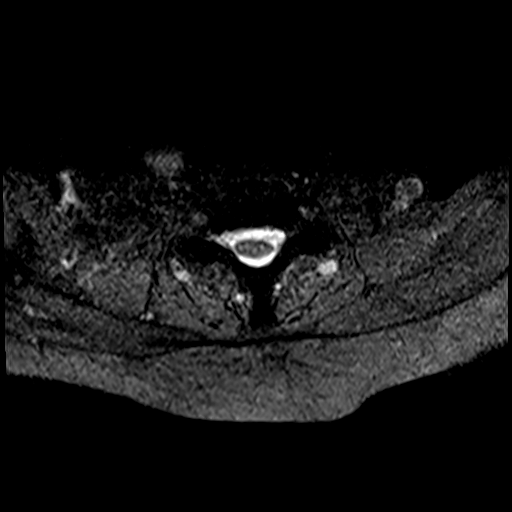
[im 11/25]
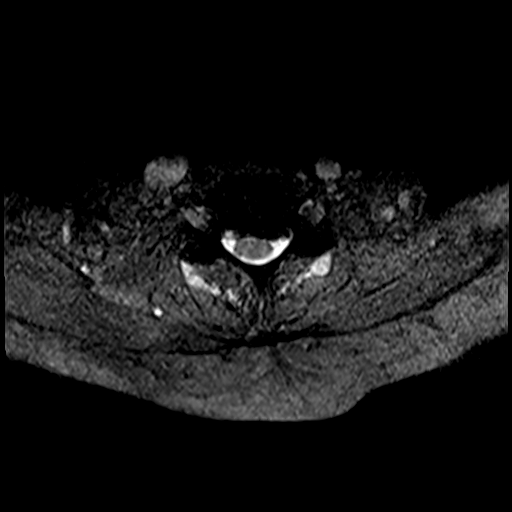
[im 15/25]
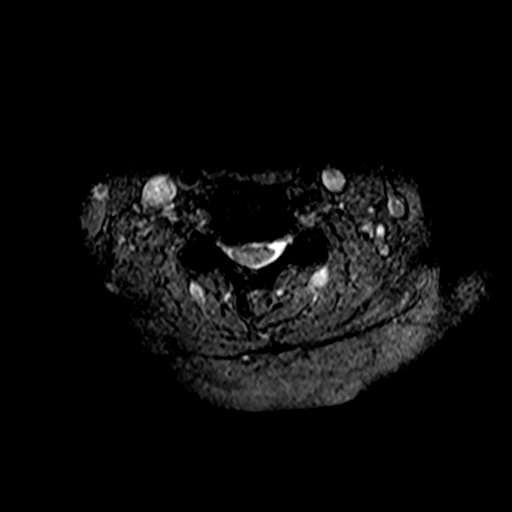
[im 17/25]
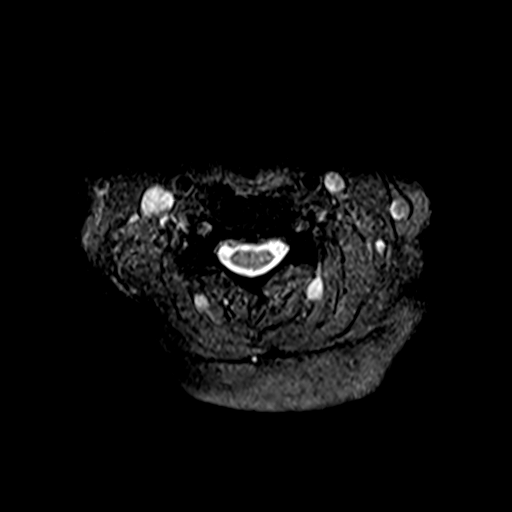
[im 21/25]
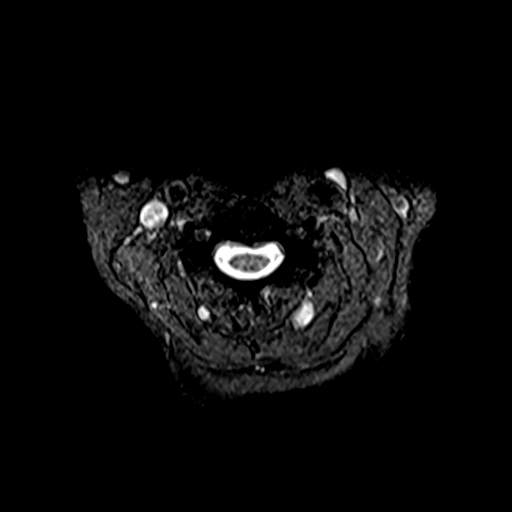

[38 of 48 positions shown; findings below may reference images not displayed]

FINDINGS: Alignment: Trace anterolisthesis at C2-C3 and C7-T1. Trace
retrolisthesis at C4-C5 and C5-C6.

Vertebrae: No fracture, evidence of discitis, or bone lesion.

Cord: Normal signal throughout.

Posterior Fossa, vertebral arteries, paraspinal tissues: Negative.

Disc levels:

C2-C3: Mild broad-based posterior disc protrusion with superimposed
small right subarticular disc protrusion. Mild bilateral
uncovertebral hypertrophy. Severe left facet arthropathy. Severe
left neuroforaminal stenosis. No spinal canal or right
neuroforaminal stenosis.

C3-C4: Large diffuse disc bulge eccentric to the left with
flattening of the ventral cord. Moderate bilateral uncovertebral
hypertrophy. Moderate right facet arthropathy. Mild spinal canal
stenosis. Mild right neuroforaminal stenosis. No left neuroforaminal
stenosis.

C4-C5: Prominent right eccentric posterior disc osteophyte complex
and moderate bilateral facet uncovertebral hypertrophy. Mild spinal
canal stenosis. Severe bilateral neuroforaminal stenosis.

C5-C6: Prominent left eccentric posterior disc osteophyte complex
and moderate bilateral uncovertebral hypertrophy. Mild bilateral
facet arthropathy. Mild spinal canal stenosis. Severe bilateral
neuroforaminal stenosis.

C6-C7: Broad-based posterior disc protrusion and moderate bilateral
facet arthropathy. Mild left neuroforaminal stenosis. No spinal
canal or right neuroforaminal stenosis.

C7-T1: Negative disc. Severe bilateral facet arthropathy. Mild left
neuroforaminal stenosis. No spinal canal or right neuroforaminal
stenosis.

Disc bulging and small disc protrusions in the visualized upper
thoracic spine without spinal canal or neuroforaminal stenosis.
IMPRESSION: 1. Multilevel cervical spondylosis as described above. Mild spinal
canal stenosis at C3-C4 with mass effect on the ventral cord.
2. Mild spinal canal and severe bilateral neuroforaminal stenosis at
C4-C5 and C5-C6.
3. Severe left neuroforaminal stenosis at C2-C3.

## 2019-06-28 DIAGNOSIS — M5126 Other intervertebral disc displacement, lumbar region: Secondary | ICD-10-CM | POA: Insufficient documentation

## 2019-06-28 DIAGNOSIS — M47812 Spondylosis without myelopathy or radiculopathy, cervical region: Secondary | ICD-10-CM | POA: Insufficient documentation

## 2019-06-28 DIAGNOSIS — M75102 Unspecified rotator cuff tear or rupture of left shoulder, not specified as traumatic: Secondary | ICD-10-CM | POA: Diagnosis not present

## 2019-06-28 DIAGNOSIS — M75100 Unspecified rotator cuff tear or rupture of unspecified shoulder, not specified as traumatic: Secondary | ICD-10-CM | POA: Insufficient documentation

## 2019-06-28 DIAGNOSIS — M5020 Other cervical disc displacement, unspecified cervical region: Secondary | ICD-10-CM | POA: Diagnosis not present

## 2019-06-28 DIAGNOSIS — M5030 Other cervical disc degeneration, unspecified cervical region: Secondary | ICD-10-CM | POA: Insufficient documentation

## 2019-06-28 DIAGNOSIS — M542 Cervicalgia: Secondary | ICD-10-CM | POA: Diagnosis not present

## 2019-06-30 DIAGNOSIS — M19012 Primary osteoarthritis, left shoulder: Secondary | ICD-10-CM | POA: Diagnosis not present

## 2019-06-30 DIAGNOSIS — M75102 Unspecified rotator cuff tear or rupture of left shoulder, not specified as traumatic: Secondary | ICD-10-CM | POA: Diagnosis not present

## 2019-07-06 ENCOUNTER — Other Ambulatory Visit: Payer: Self-pay | Admitting: Neurosurgery

## 2019-07-06 DIAGNOSIS — M5412 Radiculopathy, cervical region: Secondary | ICD-10-CM | POA: Insufficient documentation

## 2019-07-06 DIAGNOSIS — M47812 Spondylosis without myelopathy or radiculopathy, cervical region: Secondary | ICD-10-CM | POA: Diagnosis not present

## 2019-07-06 DIAGNOSIS — M5020 Other cervical disc displacement, unspecified cervical region: Secondary | ICD-10-CM | POA: Diagnosis not present

## 2019-07-06 DIAGNOSIS — M5030 Other cervical disc degeneration, unspecified cervical region: Secondary | ICD-10-CM | POA: Diagnosis not present

## 2019-07-12 ENCOUNTER — Other Ambulatory Visit: Payer: Self-pay | Admitting: Neurosurgery

## 2019-07-16 NOTE — Progress Notes (Signed)
[place], [place] [address] Phone: [phone] Fax: [place], [place] [address] [address] Phone: [phone] Fax: [place], [place] [address] [place] [address] Phone: [phone] Fax: [phone]      Your procedure is scheduled on November 18  Report to [facility] Main Entrance "A" at [place].M., and check in at the Admitting office.  Call this number if you have problems the morning of surgery:  [phone]  Call [phone] if you have any questions prior to your surgery date Monday-Friday 8am-4pm    Remember:  Do not eat or drink after midnight the night before your surgery    Take these medicines the morning of surgery with A SIP OF WATER  acetaminophen (TYLENOL)if needed gabapentin (NEURONTIN)  traMADol (ULTRAM) if needed   7 days prior to surgery STOP taking any Aspirin (unless otherwise instructed by your surgeon), Aleve, Naproxen, Ibuprofen, Motrin, Advil, Goody's, BC's, all herbal medications, fish oil, and all vitamins.    The Morning of Surgery  Do not wear jewelry, make-up or nail polish.  Do not wear lotions, powders, or perfumes/colognes, or deodorant  Do not shave 48 hours prior to surgery.    Do not bring valuables to the hospital.  [facility] is not responsible for any belongings or valuables.  If you are a smoker, DO NOT Smoke 24 hours prior to surgery IF you wear a CPAP at night please bring your mask, tubing, and machine the morning of surgery   Remember that you must have someone to transport you home after your surgery, and remain with you for 24 hours if you are discharged the same day.   Contacts, glasses, hearing aids, dentures or bridgework may not be worn into surgery.    Leave your suitcase in the car.  After surgery it  may be brought to your room.  For patients admitted to the hospital, discharge time will be determined by your treatment team.  Patients discharged the day of surgery will not be allowed to drive home.    Special instructions:   [place]- Preparing For Surgery  Before surgery, you can play an important role. Because skin is not sterile, your skin needs to be as free of germs as possible. You can reduce the number of germs on your skin by washing with CHG (chlorahexidine gluconate) Soap before surgery.  CHG is an antiseptic cleaner which kills germs and bonds with the skin to continue killing germs even after washing.    Oral Hygiene is also important to reduce your risk of infection.  Remember - BRUSH YOUR TEETH THE MORNING OF SURGERY WITH YOUR REGULAR TOOTHPASTE  Please do not use if you have an allergy to CHG or antibacterial soaps. If your skin becomes reddened/irritated stop using the CHG.  Do not shave (including legs and underarms) for at least 48 hours prior to first CHG shower. It is OK to shave your face.  Please follow these instructions carefully.   1. Shower the NIGHT BEFORE SURGERY and the MORNING OF SURGERY with CHG Soap.   2. If you chose to wash your hair, wash your hair first as usual with your normal shampoo.  3. After you shampoo, rinse your hair and body thoroughly to remove the shampoo.  4. Use CHG  as you would any other liquid soap. You can apply CHG directly to the skin and wash gently with a scrungie or a clean washcloth.   5. Apply the CHG Soap to your body ONLY FROM THE NECK DOWN.  Do not use on open wounds or open sores. Avoid contact with your eyes, ears, mouth and genitals (private parts). Wash Face and genitals (private parts)  with your normal soap.   6. Wash thoroughly, paying special attention to the area where your surgery will be performed.  7. Thoroughly rinse your body with warm water from the neck down.  8. DO NOT shower/wash with your normal  soap after using and rinsing off the CHG Soap.  9. Pat yourself dry with a CLEAN TOWEL.  10. Wear CLEAN PAJAMAS to bed the night before surgery, wear comfortable clothes the morning of surgery  11. Place CLEAN SHEETS on your bed the night of your first shower and DO NOT SLEEP WITH PETS.    Day of Surgery:  Do not apply any deodorants/lotions. Please shower the morning of surgery with the CHG soap  Please wear clean clothes to the hospital/surgery center.   Remember to brush your teeth WITH YOUR REGULAR TOOTHPASTE.   Please read over the following fact sheets that you were given.

## 2019-07-19 ENCOUNTER — Encounter (HOSPITAL_COMMUNITY)
Admission: RE | Admit: 2019-07-19 | Discharge: 2019-07-19 | Disposition: A | Discharge status: Home / Self Care | Payer: PPO | Source: Ambulatory Visit | Attending: Neurosurgery | Admitting: Neurosurgery

## 2019-07-19 ENCOUNTER — Other Ambulatory Visit (HOSPITAL_COMMUNITY)
Admission: RE | Admit: 2019-07-19 | Discharge: 2019-07-19 | Disposition: A | Discharge status: Home / Self Care | Payer: PPO | Source: Ambulatory Visit | Attending: Neurosurgery | Admitting: Neurosurgery

## 2019-07-19 ENCOUNTER — Other Ambulatory Visit: Payer: Self-pay

## 2019-07-19 ENCOUNTER — Encounter (HOSPITAL_COMMUNITY): Payer: Self-pay

## 2019-07-19 DIAGNOSIS — Z01818 Encounter for other preprocedural examination: Secondary | ICD-10-CM | POA: Insufficient documentation

## 2019-07-19 DIAGNOSIS — Z79899 Other long term (current) drug therapy: Secondary | ICD-10-CM | POA: Diagnosis not present

## 2019-07-19 DIAGNOSIS — Z888 Allergy status to other drugs, medicaments and biological substances status: Secondary | ICD-10-CM | POA: Diagnosis not present

## 2019-07-19 DIAGNOSIS — M5010 Cervical disc disorder with radiculopathy, unspecified cervical region: Secondary | ICD-10-CM | POA: Diagnosis present

## 2019-07-19 DIAGNOSIS — M4722 Other spondylosis with radiculopathy, cervical region: Secondary | ICD-10-CM | POA: Diagnosis present

## 2019-07-19 DIAGNOSIS — I10 Essential (primary) hypertension: Secondary | ICD-10-CM | POA: Diagnosis present

## 2019-07-19 DIAGNOSIS — M2578 Osteophyte, vertebrae: Secondary | ICD-10-CM | POA: Diagnosis present

## 2019-07-19 DIAGNOSIS — Z7982 Long term (current) use of aspirin: Secondary | ICD-10-CM | POA: Diagnosis not present

## 2019-07-19 DIAGNOSIS — M5011 Cervical disc disorder with radiculopathy,  high cervical region: Secondary | ICD-10-CM | POA: Diagnosis not present

## 2019-07-19 DIAGNOSIS — M4322 Fusion of spine, cervical region: Secondary | ICD-10-CM | POA: Diagnosis not present

## 2019-07-19 DIAGNOSIS — M4802 Spinal stenosis, cervical region: Secondary | ICD-10-CM | POA: Diagnosis present

## 2019-07-19 DIAGNOSIS — Z20828 Contact with and (suspected) exposure to other viral communicable diseases: Secondary | ICD-10-CM | POA: Diagnosis present

## 2019-07-19 DIAGNOSIS — M5031 Other cervical disc degeneration,  high cervical region: Secondary | ICD-10-CM | POA: Diagnosis not present

## 2019-07-19 DIAGNOSIS — Z91013 Allergy to seafood: Secondary | ICD-10-CM | POA: Diagnosis not present

## 2019-07-19 DIAGNOSIS — Z8249 Family history of ischemic heart disease and other diseases of the circulatory system: Secondary | ICD-10-CM | POA: Diagnosis not present

## 2019-07-19 DIAGNOSIS — M50221 Other cervical disc displacement at C4-C5 level: Secondary | ICD-10-CM | POA: Diagnosis present

## 2019-07-19 DIAGNOSIS — M50222 Other cervical disc displacement at C5-C6 level: Secondary | ICD-10-CM | POA: Diagnosis present

## 2019-07-19 DIAGNOSIS — Z820 Family history of epilepsy and other diseases of the nervous system: Secondary | ICD-10-CM | POA: Diagnosis not present

## 2019-07-19 HISTORY — DX: Family history of other specified conditions: Z84.89

## 2019-07-19 LAB — SURGICAL PCR SCREEN
MRSA, PCR: NEGATIVE
Staphylococcus aureus: POSITIVE — AB

## 2019-07-19 LAB — SARS CORONAVIRUS 2 (TAT 6-24 HRS): SARS Coronavirus 2: NEGATIVE

## 2019-07-19 LAB — CBC
HCT: 41.6 % (ref 36.0–46.0)
Hemoglobin: 13.5 g/dL (ref 12.0–15.0)
MCH: 32.1 pg (ref 26.0–34.0)
MCHC: 32.5 g/dL (ref 30.0–36.0)
MCV: 98.8 fL (ref 80.0–100.0)
Platelets: 330 10*3/uL (ref 150–400)
RBC: 4.21 MIL/uL (ref 3.87–5.11)
RDW: 12 % (ref 11.5–15.5)
WBC: 6 10*3/uL (ref 4.0–10.5)
nRBC: 0 % (ref 0.0–0.2)

## 2019-07-19 LAB — BASIC METABOLIC PANEL
Anion gap: 15 (ref 5–15)
BUN: 7 mg/dL — ABNORMAL LOW (ref 8–23)
CO2: 24 mmol/L (ref 22–32)
Calcium: 9.7 mg/dL (ref 8.9–10.3)
Chloride: 98 mmol/L (ref 98–111)
Creatinine, Ser: 0.59 mg/dL (ref 0.44–1.00)
GFR calc Af Amer: >60 mL/min (ref >60)
GFR calc non Af Amer: >60 mL/min (ref >60)
Glucose, Bld: 93 mg/dL (ref 70–99)
Potassium: 4.3 mmol/L (ref 3.5–5.1)
Sodium: 137 mmol/L (ref 135–145)

## 2019-07-19 LAB — ABO/RH: ABO/RH(D): A POS

## 2019-07-19 LAB — TYPE AND SCREEN
ABO/RH(D): A POS
Antibody Screen: NEGATIVE

## 2019-07-19 NOTE — Progress Notes (Signed)
  PCP - Fenton Malling Cardiologist - denies   Chest x-ray - n/a EKG -11/16  Stress Test - denies ECHO - denies Cardiac Cath - denies  Aspirin Instructions: took last dose a week ago  COVID TEST- 11/16   Anesthesia review: NO  Patient denies shortness of breath, fever, cough and chest pain at PAT appointment   All instructions explained to the patient, with a verbal understanding of the material. Patient agrees to go over the instructions while at home for a better understanding. Patient also instructed to self quarantine after being tested for COVID-19. The opportunity to ask questions was provided.

## 2019-07-21 ENCOUNTER — Encounter (HOSPITAL_COMMUNITY): Payer: Self-pay

## 2019-07-21 ENCOUNTER — Encounter (HOSPITAL_COMMUNITY)
Admission: RE | Disposition: A | Discharge status: Home / Self Care | Payer: Self-pay | Source: Home / Self Care | Attending: Neurosurgery

## 2019-07-21 ENCOUNTER — Inpatient Hospital Stay (HOSPITAL_COMMUNITY)
Admission: RE | Admit: 2019-07-21 | Discharge: 2019-07-22 | DRG: 473 | Disposition: A | Discharge status: Home / Self Care | Payer: PPO | Attending: Neurosurgery | Admitting: Neurosurgery

## 2019-07-21 ENCOUNTER — Other Ambulatory Visit: Payer: Self-pay

## 2019-07-21 ENCOUNTER — Inpatient Hospital Stay (HOSPITAL_COMMUNITY): Payer: PPO

## 2019-07-21 ENCOUNTER — Inpatient Hospital Stay (HOSPITAL_COMMUNITY): Payer: PPO | Admitting: Certified Registered Nurse Anesthetist

## 2019-07-21 DIAGNOSIS — Z8249 Family history of ischemic heart disease and other diseases of the circulatory system: Secondary | ICD-10-CM

## 2019-07-21 DIAGNOSIS — Z20828 Contact with and (suspected) exposure to other viral communicable diseases: Secondary | ICD-10-CM | POA: Diagnosis present

## 2019-07-21 DIAGNOSIS — I10 Essential (primary) hypertension: Secondary | ICD-10-CM | POA: Diagnosis present

## 2019-07-21 DIAGNOSIS — Z820 Family history of epilepsy and other diseases of the nervous system: Secondary | ICD-10-CM | POA: Diagnosis not present

## 2019-07-21 DIAGNOSIS — M50222 Other cervical disc displacement at C5-C6 level: Secondary | ICD-10-CM | POA: Diagnosis present

## 2019-07-21 DIAGNOSIS — Z7982 Long term (current) use of aspirin: Secondary | ICD-10-CM

## 2019-07-21 DIAGNOSIS — M5010 Cervical disc disorder with radiculopathy, unspecified cervical region: Principal | ICD-10-CM | POA: Diagnosis present

## 2019-07-21 DIAGNOSIS — Z888 Allergy status to other drugs, medicaments and biological substances status: Secondary | ICD-10-CM

## 2019-07-21 DIAGNOSIS — Z91013 Allergy to seafood: Secondary | ICD-10-CM | POA: Diagnosis not present

## 2019-07-21 DIAGNOSIS — Z419 Encounter for procedure for purposes other than remedying health state, unspecified: Secondary | ICD-10-CM

## 2019-07-21 DIAGNOSIS — M4322 Fusion of spine, cervical region: Secondary | ICD-10-CM | POA: Diagnosis not present

## 2019-07-21 DIAGNOSIS — M4802 Spinal stenosis, cervical region: Secondary | ICD-10-CM | POA: Diagnosis present

## 2019-07-21 DIAGNOSIS — M4722 Other spondylosis with radiculopathy, cervical region: Secondary | ICD-10-CM | POA: Diagnosis present

## 2019-07-21 DIAGNOSIS — M2578 Osteophyte, vertebrae: Secondary | ICD-10-CM | POA: Diagnosis present

## 2019-07-21 DIAGNOSIS — Z79899 Other long term (current) drug therapy: Secondary | ICD-10-CM | POA: Diagnosis not present

## 2019-07-21 DIAGNOSIS — M50221 Other cervical disc displacement at C4-C5 level: Secondary | ICD-10-CM | POA: Diagnosis present

## 2019-07-21 DIAGNOSIS — M5020 Other cervical disc displacement, unspecified cervical region: Secondary | ICD-10-CM | POA: Diagnosis present

## 2019-07-21 HISTORY — PX: ANTERIOR CERVICAL DECOMP/DISCECTOMY FUSION: SHX1161

## 2019-07-21 IMAGING — CR DG CERVICAL SPINE 2 OR 3 VIEWS
2 series · 2 of 2 positions shown · non-contrast
Comparison: MRI [DATE]

CLINICAL DATA: ACDF.

EXAM:
CERVICAL SPINE - 2-3 VIEW

[xtable lateral (1 of 2)]
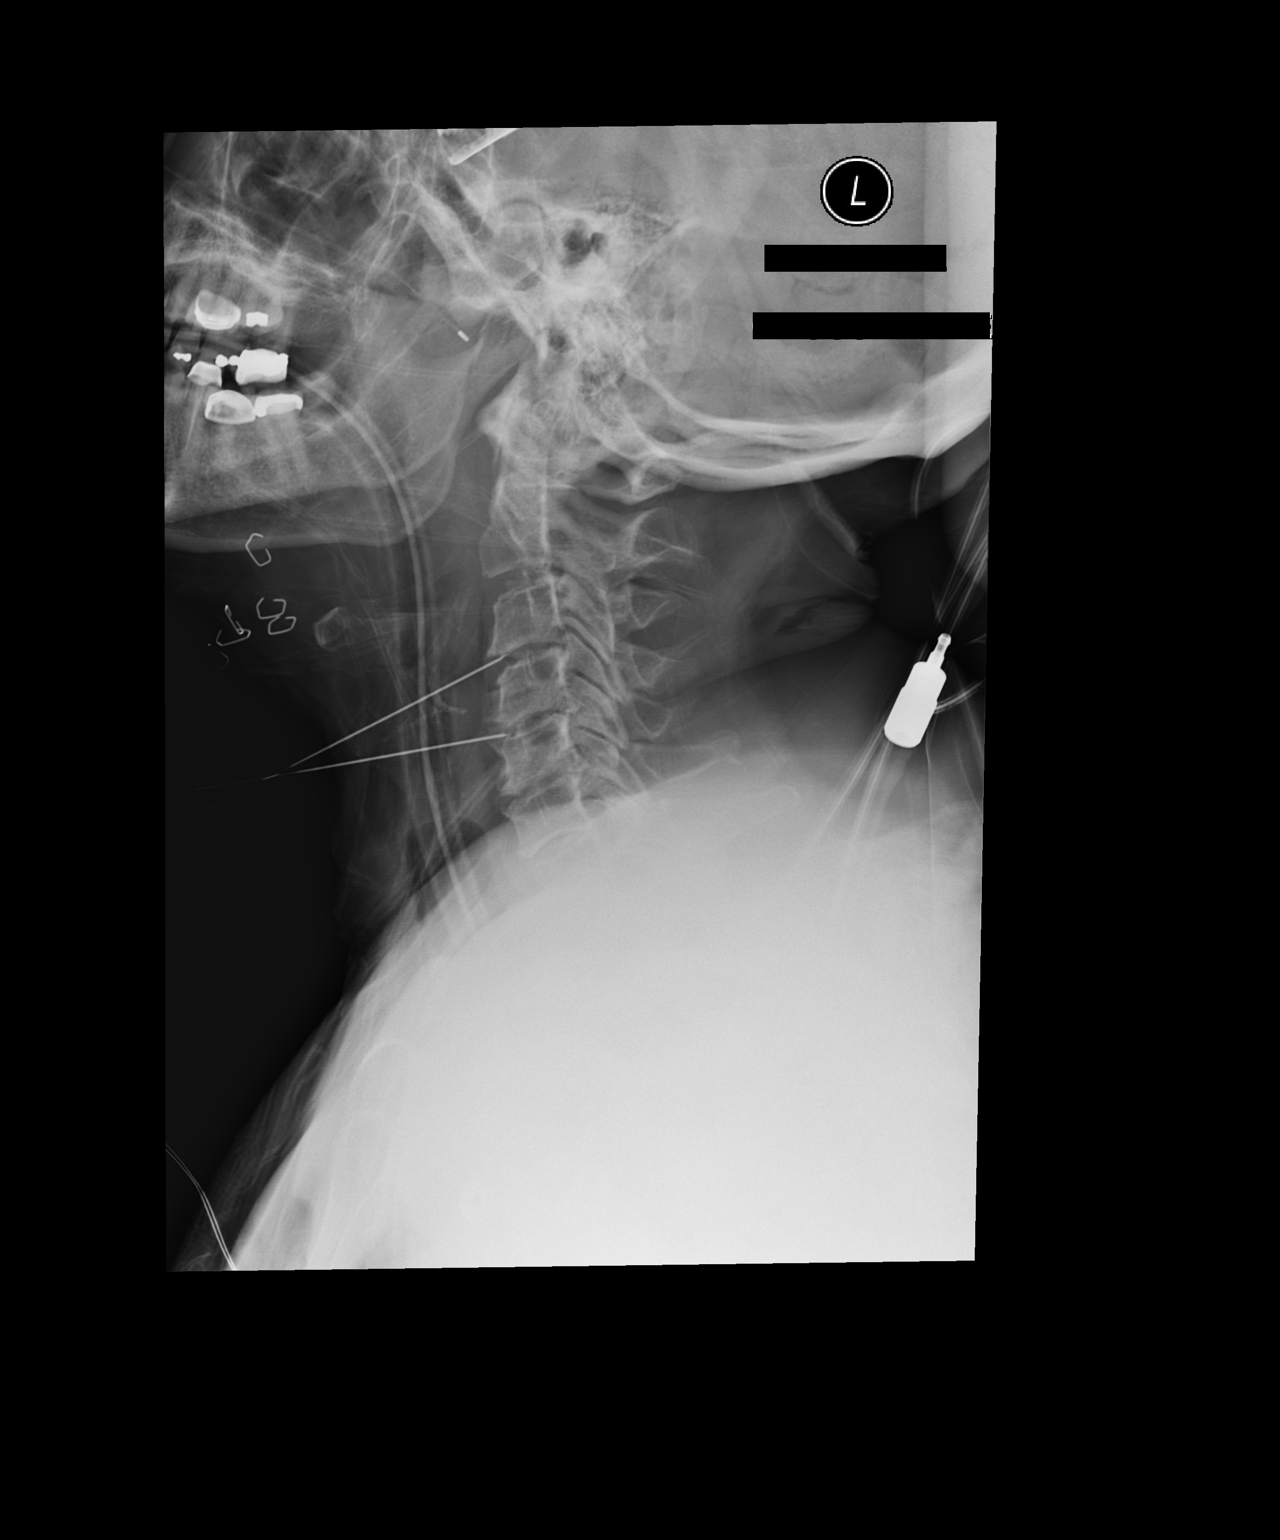

[xtable lateral (2 of 2)]
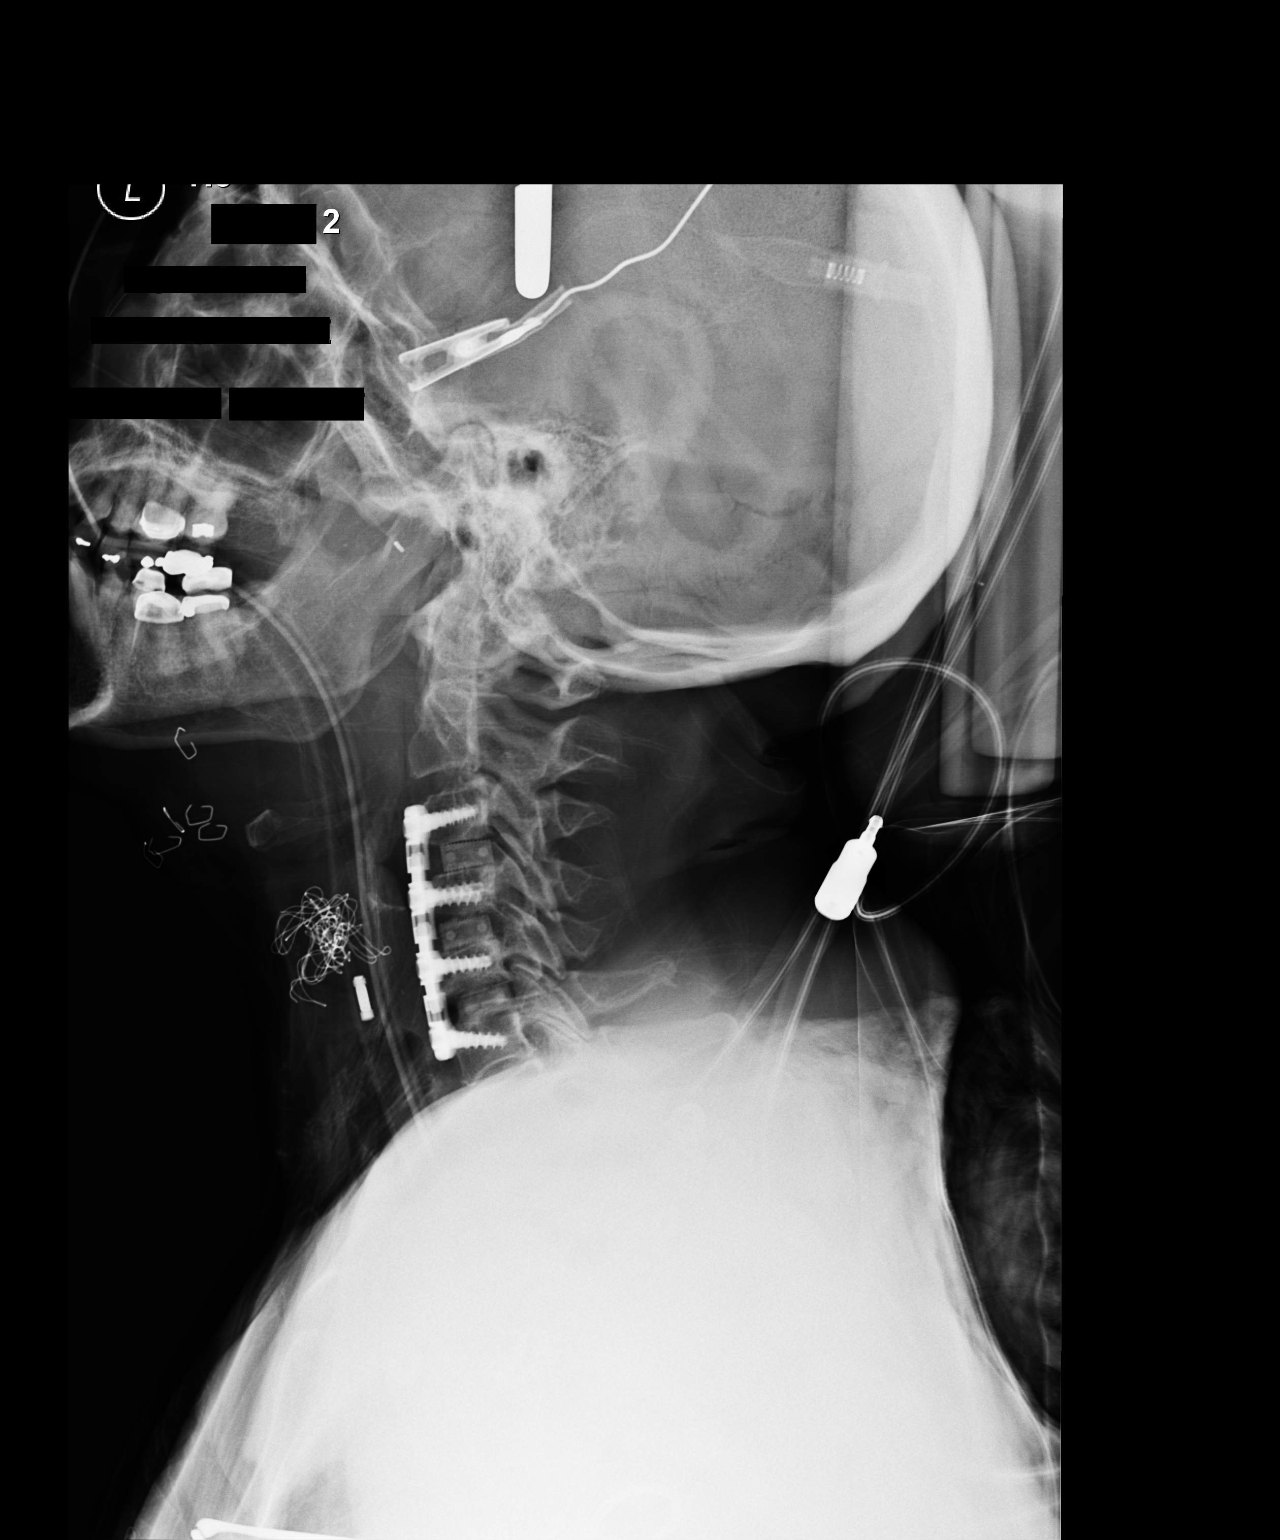

[2 of 2 positions shown; findings below may reference images not displayed]

FINDINGS: On the first image obtained at [EH] hours 2 surgical probes are
identified. One probe is in the C3-C4 disc space in the other probe
is in the C4-C5 disc space.

On the second image anterior sideplate and screw device extends from
C3 through C6 with interbody fusion at C3-4, C4-5, and C5-6.
IMPRESSION: 1. Portable intraoperative radiographs obtained in the operating
room show cervical spine level localization and subsequent ACDF of
C3 through C6.

## 2019-07-21 SURGERY — ANTERIOR CERVICAL DECOMPRESSION/DISCECTOMY FUSION 2 LEVELS
Anesthesia: General | Site: Spine Cervical

## 2019-07-21 MED ORDER — ONDANSETRON HCL 4 MG PO TABS: 4.0000 mg | ORAL_TABLET | Freq: Four times a day (QID) | ORAL | Status: DC | PRN

## 2019-07-21 MED ORDER — OXYCODONE HCL 5 MG/5ML PO SOLN: 5.0000 mg | Freq: Once | ORAL | Status: DC | PRN

## 2019-07-21 MED ORDER — HYDROCODONE-ACETAMINOPHEN 5-325 MG PO TABS
1.0000 | ORAL_TABLET | ORAL | Status: DC | PRN
  Administered 2019-07-21 – 2019-07-22 (×4): 1 via ORAL
  Filled 2019-07-21 (×2): qty 1
  Filled 2019-07-21: qty 2
  Filled 2019-07-21: qty 1

## 2019-07-21 MED ORDER — LIDOCAINE 2% (20 MG/ML) 5 ML SYRINGE
INTRAMUSCULAR | Status: AC
  Filled 2019-07-21: qty 5

## 2019-07-21 MED ORDER — MORPHINE SULFATE (PF) 4 MG/ML IV SOLN
4.0000 mg | INTRAVENOUS | Status: DC | PRN
  Administered 2019-07-21: 14:00:00 4 mg via INTRAMUSCULAR
  Filled 2019-07-21: qty 1

## 2019-07-21 MED ORDER — VITAMIN C 500 MG PO TABS
500.0000 mg | ORAL_TABLET | Freq: Every day | ORAL | Status: DC
  Administered 2019-07-21 – 2019-07-22 (×2): 500 mg via ORAL
  Filled 2019-07-21 (×2): qty 1

## 2019-07-21 MED ORDER — CEFAZOLIN SODIUM-DEXTROSE 2-4 GM/100ML-% IV SOLN
2.0000 g | INTRAVENOUS | Status: AC
  Administered 2019-07-21: 2 g via INTRAVENOUS

## 2019-07-21 MED ORDER — ONDANSETRON HCL 4 MG/2ML IJ SOLN
INTRAMUSCULAR | Status: DC | PRN
  Administered 2019-07-21 (×2): 4 mg via INTRAVENOUS

## 2019-07-21 MED ORDER — ONDANSETRON HCL 4 MG/2ML IJ SOLN: 4.0000 mg | Freq: Four times a day (QID) | INTRAMUSCULAR | Status: DC | PRN

## 2019-07-21 MED ORDER — PHENYLEPHRINE HCL-NACL 10-0.9 MG/250ML-% IV SOLN
INTRAVENOUS | Status: DC | PRN
  Administered 2019-07-21: 20 ug/min via INTRAVENOUS

## 2019-07-21 MED ORDER — THROMBIN 20000 UNITS EX SOLR
CUTANEOUS | Status: AC
  Filled 2019-07-21: qty 20000

## 2019-07-21 MED ORDER — SODIUM CHLORIDE 0.9 % IV SOLN: 250.0000 mL | INTRAVENOUS | Status: DC

## 2019-07-21 MED ORDER — KETOROLAC TROMETHAMINE 15 MG/ML IJ SOLN
INTRAMUSCULAR | Status: AC
  Filled 2019-07-21: qty 1

## 2019-07-21 MED ORDER — ROCURONIUM BROMIDE 50 MG/5ML IV SOSY
PREFILLED_SYRINGE | INTRAVENOUS | Status: DC | PRN
  Administered 2019-07-21: 5 mg via INTRAVENOUS
  Administered 2019-07-21: 30 mg via INTRAVENOUS
  Administered 2019-07-21: 60 mg via INTRAVENOUS
  Administered 2019-07-21: 15 mg via INTRAVENOUS

## 2019-07-21 MED ORDER — ACETAMINOPHEN 10 MG/ML IV SOLN
INTRAVENOUS | Status: DC | PRN
  Administered 2019-07-21: 1000 mg via INTRAVENOUS

## 2019-07-21 MED ORDER — MIDAZOLAM HCL 2 MG/2ML IJ SOLN
INTRAMUSCULAR | Status: AC
  Filled 2019-07-21: qty 2

## 2019-07-21 MED ORDER — HYDROXYZINE HCL 50 MG/ML IM SOLN: 50.0000 mg | INTRAMUSCULAR | Status: DC | PRN

## 2019-07-21 MED ORDER — 0.9 % SODIUM CHLORIDE (POUR BTL) OPTIME
TOPICAL | Status: DC | PRN
  Administered 2019-07-21: 1000 mL

## 2019-07-21 MED ORDER — FENTANYL CITRATE (PF) 250 MCG/5ML IJ SOLN
INTRAMUSCULAR | Status: AC
  Filled 2019-07-21: qty 5

## 2019-07-21 MED ORDER — ROCURONIUM BROMIDE 10 MG/ML (PF) SYRINGE
PREFILLED_SYRINGE | INTRAVENOUS | Status: AC
  Filled 2019-07-21: qty 20

## 2019-07-21 MED ORDER — BUPIVACAINE HCL (PF) 0.5 % IJ SOLN
INTRAMUSCULAR | Status: AC
  Filled 2019-07-21: qty 30

## 2019-07-21 MED ORDER — PROPOFOL 10 MG/ML IV BOLUS
INTRAVENOUS | Status: AC
  Filled 2019-07-21: qty 20

## 2019-07-21 MED ORDER — ALUM & MAG HYDROXIDE-SIMETH 200-200-20 MG/5ML PO SUSP: 30.0000 mL | Freq: Four times a day (QID) | ORAL | Status: DC | PRN

## 2019-07-21 MED ORDER — HYDROXYZINE HCL 25 MG PO TABS: 50.0000 mg | ORAL_TABLET | ORAL | Status: DC | PRN

## 2019-07-21 MED ORDER — GABAPENTIN 600 MG PO TABS: 600.0000 mg | ORAL_TABLET | Freq: Two times a day (BID) | ORAL | Status: DC

## 2019-07-21 MED ORDER — VITAMIN D 25 MCG (1000 UNIT) PO TABS
1000.0000 [IU] | ORAL_TABLET | Freq: Every day | ORAL | Status: DC
  Administered 2019-07-21 – 2019-07-22 (×2): 1000 [IU] via ORAL
  Filled 2019-07-21 (×2): qty 1

## 2019-07-21 MED ORDER — THROMBIN 5000 UNITS EX SOLR
OROMUCOSAL | Status: DC | PRN
  Administered 2019-07-21: 10:00:00 via TOPICAL

## 2019-07-21 MED ORDER — KETOROLAC TROMETHAMINE 30 MG/ML IJ SOLN
15.0000 mg | Freq: Once | INTRAMUSCULAR | Status: AC
  Administered 2019-07-21: 12:00:00 15 mg via INTRAVENOUS

## 2019-07-21 MED ORDER — BISACODYL 10 MG RE SUPP: 10.0000 mg | Freq: Every day | RECTAL | Status: DC | PRN

## 2019-07-21 MED ORDER — CEFAZOLIN SODIUM-DEXTROSE 2-4 GM/100ML-% IV SOLN
INTRAVENOUS | Status: AC
  Filled 2019-07-21: qty 100

## 2019-07-21 MED ORDER — FLEET ENEMA 7-19 GM/118ML RE ENEM: 1.0000 | ENEMA | Freq: Once | RECTAL | Status: DC | PRN

## 2019-07-21 MED ORDER — THROMBIN 5000 UNITS EX SOLR
CUTANEOUS | Status: AC
  Filled 2019-07-21: qty 5000

## 2019-07-21 MED ORDER — CHLORHEXIDINE GLUCONATE CLOTH 2 % EX PADS: 6.0000 | MEDICATED_PAD | Freq: Once | CUTANEOUS | Status: DC

## 2019-07-21 MED ORDER — OXYCODONE HCL 5 MG PO TABS: 5.0000 mg | ORAL_TABLET | Freq: Once | ORAL | Status: DC | PRN

## 2019-07-21 MED ORDER — ACETAMINOPHEN 10 MG/ML IV SOLN
INTRAVENOUS | Status: AC
  Filled 2019-07-21: qty 100

## 2019-07-21 MED ORDER — THROMBIN 20000 UNITS EX SOLR
CUTANEOUS | Status: DC | PRN
  Administered 2019-07-21: 10:00:00 via TOPICAL

## 2019-07-21 MED ORDER — KETOROLAC TROMETHAMINE 30 MG/ML IJ SOLN
15.0000 mg | Freq: Four times a day (QID) | INTRAMUSCULAR | Status: DC
  Administered 2019-07-21 – 2019-07-22 (×3): 15 mg via INTRAVENOUS
  Filled 2019-07-21 (×3): qty 1

## 2019-07-21 MED ORDER — SODIUM CHLORIDE 0.9% FLUSH: 3.0000 mL | INTRAVENOUS | Status: DC | PRN

## 2019-07-21 MED ORDER — ACETAMINOPHEN 10 MG/ML IV SOLN: 1000.0000 mg | Freq: Once | INTRAVENOUS | Status: DC | PRN

## 2019-07-21 MED ORDER — PHENOL 1.4 % MT LIQD: 1.0000 | OROMUCOSAL | Status: DC | PRN

## 2019-07-21 MED ORDER — LACTATED RINGERS IV SOLN
INTRAVENOUS | Status: DC | PRN
  Administered 2019-07-21 (×2): via INTRAVENOUS

## 2019-07-21 MED ORDER — LOSARTAN POTASSIUM 50 MG PO TABS
50.0000 mg | ORAL_TABLET | Freq: Every day | ORAL | Status: DC
  Administered 2019-07-21 – 2019-07-22 (×2): 50 mg via ORAL
  Filled 2019-07-21 (×2): qty 1

## 2019-07-21 MED ORDER — FENTANYL CITRATE (PF) 100 MCG/2ML IJ SOLN
INTRAMUSCULAR | Status: DC | PRN
  Administered 2019-07-21: 100 ug via INTRAVENOUS
  Administered 2019-07-21 (×3): 50 ug via INTRAVENOUS
  Administered 2019-07-21: 25 ug via INTRAVENOUS
  Administered 2019-07-21 (×3): 50 ug via INTRAVENOUS

## 2019-07-21 MED ORDER — MAGNESIUM HYDROXIDE 400 MG/5ML PO SUSP: 30.0000 mL | Freq: Every day | ORAL | Status: DC | PRN

## 2019-07-21 MED ORDER — SODIUM CHLORIDE 0.9% FLUSH
3.0000 mL | Freq: Two times a day (BID) | INTRAVENOUS | Status: DC
  Administered 2019-07-21: 21:00:00 3 mL via INTRAVENOUS

## 2019-07-21 MED ORDER — SUGAMMADEX SODIUM 200 MG/2ML IV SOLN
INTRAVENOUS | Status: DC | PRN
  Administered 2019-07-21: 150 mg via INTRAVENOUS

## 2019-07-21 MED ORDER — DEXAMETHASONE SODIUM PHOSPHATE 10 MG/ML IJ SOLN
INTRAMUSCULAR | Status: AC
  Filled 2019-07-21: qty 1

## 2019-07-21 MED ORDER — GABAPENTIN 600 MG PO TABS
600.0000 mg | ORAL_TABLET | Freq: Every day | ORAL | Status: DC
  Administered 2019-07-21 – 2019-07-22 (×2): 600 mg via ORAL
  Filled 2019-07-21 (×2): qty 1

## 2019-07-21 MED ORDER — LACTATED RINGERS IV SOLN
INTRAVENOUS | Status: DC | PRN
  Administered 2019-07-21: 08:00:00 via INTRAVENOUS

## 2019-07-21 MED ORDER — PHENYLEPHRINE 40 MCG/ML (10ML) SYRINGE FOR IV PUSH (FOR BLOOD PRESSURE SUPPORT)
PREFILLED_SYRINGE | INTRAVENOUS | Status: AC
  Filled 2019-07-21: qty 10

## 2019-07-21 MED ORDER — ACETAMINOPHEN 500 MG PO TABS: 1000.0000 mg | ORAL_TABLET | Freq: Once | ORAL | Status: DC | PRN

## 2019-07-21 MED ORDER — SODIUM CHLORIDE 0.9 % IV SOLN
INTRAVENOUS | Status: DC | PRN
  Administered 2019-07-21: 10:00:00

## 2019-07-21 MED ORDER — LIDOCAINE-EPINEPHRINE 1 %-1:100000 IJ SOLN
INTRAMUSCULAR | Status: DC | PRN
  Administered 2019-07-21: 7.5 mL

## 2019-07-21 MED ORDER — LIDOCAINE 2% (20 MG/ML) 5 ML SYRINGE
INTRAMUSCULAR | Status: DC | PRN
  Administered 2019-07-21: 40 mg via INTRAVENOUS

## 2019-07-21 MED ORDER — PROPOFOL 10 MG/ML IV BOLUS
INTRAVENOUS | Status: DC | PRN
  Administered 2019-07-21: 100 mg via INTRAVENOUS

## 2019-07-21 MED ORDER — LIDOCAINE-EPINEPHRINE 1 %-1:100000 IJ SOLN
INTRAMUSCULAR | Status: AC
  Filled 2019-07-21: qty 1

## 2019-07-21 MED ORDER — BUPIVACAINE HCL (PF) 0.5 % IJ SOLN
INTRAMUSCULAR | Status: DC | PRN
  Administered 2019-07-21: 7.5 mL

## 2019-07-21 MED ORDER — GABAPENTIN 300 MG PO CAPS
900.0000 mg | ORAL_CAPSULE | Freq: Every day | ORAL | Status: DC
  Administered 2019-07-21: 21:00:00 900 mg via ORAL
  Filled 2019-07-21: qty 3

## 2019-07-21 MED ORDER — MIDAZOLAM HCL 5 MG/5ML IJ SOLN
INTRAMUSCULAR | Status: DC | PRN
  Administered 2019-07-21: 1 mg via INTRAVENOUS

## 2019-07-21 MED ORDER — DEXAMETHASONE SODIUM PHOSPHATE 10 MG/ML IJ SOLN
INTRAMUSCULAR | Status: DC | PRN
  Administered 2019-07-21: 10 mg via INTRAVENOUS

## 2019-07-21 MED ORDER — ACETAMINOPHEN 160 MG/5ML PO SOLN: 1000.0000 mg | Freq: Once | ORAL | Status: DC | PRN

## 2019-07-21 MED ORDER — FENTANYL CITRATE (PF) 100 MCG/2ML IJ SOLN: 25.0000 ug | INTRAMUSCULAR | Status: DC | PRN

## 2019-07-21 MED ORDER — ACETAMINOPHEN 325 MG PO TABS: 650.0000 mg | ORAL_TABLET | ORAL | Status: DC | PRN

## 2019-07-21 MED ORDER — ESTRADIOL 1 MG PO TABS
0.5000 mg | ORAL_TABLET | Freq: Every day | ORAL | Status: DC
  Administered 2019-07-21 – 2019-07-22 (×2): 0.5 mg via ORAL
  Filled 2019-07-21 (×2): qty 0.5

## 2019-07-21 MED ORDER — ONDANSETRON HCL 4 MG/2ML IJ SOLN
INTRAMUSCULAR | Status: AC
  Filled 2019-07-21: qty 2

## 2019-07-21 MED ORDER — MENTHOL 3 MG MT LOZG
1.0000 | LOZENGE | OROMUCOSAL | Status: DC | PRN
  Filled 2019-07-21: qty 9

## 2019-07-21 MED ORDER — KCL IN DEXTROSE-NACL 20-5-0.45 MEQ/L-%-% IV SOLN: INTRAVENOUS | Status: DC

## 2019-07-21 MED ORDER — CYCLOBENZAPRINE HCL 5 MG PO TABS
5.0000 mg | ORAL_TABLET | Freq: Three times a day (TID) | ORAL | Status: DC | PRN
  Administered 2019-07-21: 21:00:00 5 mg via ORAL
  Filled 2019-07-21: qty 1

## 2019-07-21 MED ORDER — ACETAMINOPHEN 650 MG RE SUPP: 650.0000 mg | RECTAL | Status: DC | PRN

## 2019-07-21 SURGICAL SUPPLY — 58 items
ADH SKN CLS APL DERMABOND .7 (GAUZE/BANDAGES/DRESSINGS) ×1
ALLOGRAFT CA 6X14X11 (Bone Implant) ×3 IMPLANT
BAG DECANTER FOR FLEXI CONT (MISCELLANEOUS) ×2 IMPLANT
BAND INSRT 18 STRL LF DISP RB (MISCELLANEOUS) ×2
BAND RUBBER #18 3X1/16 STRL (MISCELLANEOUS) ×4 IMPLANT
BIT DRILL 13 (BIT) ×1 IMPLANT
BIT DRILL NEURO 2X3.1 SFT TUCH (MISCELLANEOUS) ×1 IMPLANT
BLADE ULTRA TIP 2M (BLADE) ×1 IMPLANT
CANISTER SUCT 3000ML PPV (MISCELLANEOUS) ×2 IMPLANT
CARTRIDGE OIL MAESTRO DRILL (MISCELLANEOUS) ×1 IMPLANT
COVER MAYO STAND STRL (DRAPES) ×2 IMPLANT
DERMABOND ADVANCED (GAUZE/BANDAGES/DRESSINGS) ×1
DERMABOND ADVANCED .7 DNX12 (GAUZE/BANDAGES/DRESSINGS) ×1 IMPLANT
DIFFUSER DRILL AIR PNEUMATIC (MISCELLANEOUS) ×2 IMPLANT
DRAPE HALF SHEET 40X57 (DRAPES) IMPLANT
DRAPE LAPAROTOMY 100X72 PEDS (DRAPES) ×2 IMPLANT
DRAPE MICROSCOPE LEICA (MISCELLANEOUS) ×2 IMPLANT
DRAPE POUCH INSTRU U-SHP 10X18 (DRAPES) ×2 IMPLANT
DRILL NEURO 2X3.1 SOFT TOUCH (MISCELLANEOUS) ×4
ELECT COATED BLADE 2.86 ST (ELECTRODE) ×2 IMPLANT
ELECT REM PT RETURN 9FT ADLT (ELECTROSURGICAL) ×2
ELECTRODE REM PT RTRN 9FT ADLT (ELECTROSURGICAL) ×1 IMPLANT
GLOVE BIOGEL PI IND STRL 7.5 (GLOVE) IMPLANT
GLOVE BIOGEL PI IND STRL 8 (GLOVE) ×1 IMPLANT
GLOVE BIOGEL PI INDICATOR 7.5 (GLOVE) ×3
GLOVE BIOGEL PI INDICATOR 8 (GLOVE) ×2
GLOVE ECLIPSE 7.5 STRL STRAW (GLOVE) ×3 IMPLANT
GLOVE SS BIOGEL STRL SZ 7 (GLOVE) IMPLANT
GLOVE SUPERSENSE BIOGEL SZ 7 (GLOVE) ×3
GOWN STRL REUS W/ TWL LRG LVL3 (GOWN DISPOSABLE) IMPLANT
GOWN STRL REUS W/ TWL XL LVL3 (GOWN DISPOSABLE) ×1 IMPLANT
GOWN STRL REUS W/TWL 2XL LVL3 (GOWN DISPOSABLE) ×2 IMPLANT
GOWN STRL REUS W/TWL LRG LVL3 (GOWN DISPOSABLE) ×4
GOWN STRL REUS W/TWL XL LVL3 (GOWN DISPOSABLE)
HALTER HD/CHIN CERV TRACTION D (MISCELLANEOUS) ×2 IMPLANT
HEMOSTAT POWDER KIT SURGIFOAM (HEMOSTASIS) ×2 IMPLANT
KIT BASIN OR (CUSTOM PROCEDURE TRAY) ×2 IMPLANT
KIT TURNOVER KIT B (KITS) ×2 IMPLANT
NDL HYPO 25GX1X1/2 BEV (NEEDLE) ×1 IMPLANT
NDL SPNL 22GX3.5 QUINCKE BK (NEEDLE) ×1 IMPLANT
NEEDLE HYPO 25GX1X1/2 BEV (NEEDLE) ×2 IMPLANT
NEEDLE SPNL 22GX3.5 QUINCKE BK (NEEDLE) ×2 IMPLANT
NS IRRIG 1000ML POUR BTL (IV SOLUTION) ×2 IMPLANT
OIL CARTRIDGE MAESTRO DRILL (MISCELLANEOUS) ×2
PACK LAMINECTOMY NEURO (CUSTOM PROCEDURE TRAY) ×2 IMPLANT
PLATE 3 55XLCK NS SPNE CVD (Plate) IMPLANT
PLATE 3 ATLANTIS TRANS (Plate) ×2 IMPLANT
SCREW ST 13X4XST VA NS SPNE (Screw) IMPLANT
SCREW ST FIX 4 ATL 3120213 (Screw) ×6 IMPLANT
SCREW ST VAR 4 ATL (Screw) ×4 IMPLANT
SPONGE INTESTINAL PEANUT (DISPOSABLE) ×2 IMPLANT
SPONGE SURGIFOAM ABS GEL 100 (HEMOSTASIS) ×2 IMPLANT
STAPLER SKIN PROX WIDE 3.9 (STAPLE) ×1 IMPLANT
SUT VIC AB 2-0 CP2 18 (SUTURE) ×2 IMPLANT
SUT VIC AB 3-0 SH 8-18 (SUTURE) ×2 IMPLANT
TOWEL GREEN STERILE (TOWEL DISPOSABLE) ×2 IMPLANT
TOWEL GREEN STERILE FF (TOWEL DISPOSABLE) ×2 IMPLANT
WATER STERILE IRR 1000ML POUR (IV SOLUTION) ×2 IMPLANT

## 2019-07-21 NOTE — Anesthesia Preprocedure Evaluation (Signed)
Anesthesia Evaluation  Patient identified by MRN, date of birth, ID band Patient awake    Reviewed: Allergy & Precautions, NPO status , Patient's Chart, lab work & pertinent test results  History of Anesthesia Complications Negative for: history of anesthetic complications  Airway Mallampati: IV  TM Distance: <3 FB Neck ROM: Full    Dental  (+) Dental Advisory Given, Teeth Intact   Pulmonary neg recent URI,    breath sounds clear to auscultation       Cardiovascular hypertension, Pt. on medications (-) angina(-) Past MI and (-) CHF  Rhythm:Regular     Neuro/Psych negative psych ROS   GI/Hepatic negative GI ROS, Neg liver ROS,   Endo/Other  negative endocrine ROS  Renal/GU negative Renal ROS     Musculoskeletal  (+) Arthritis ,   Abdominal   Peds  Hematology   Anesthesia Other Findings   Reproductive/Obstetrics                             Anesthesia Physical Anesthesia Plan  ASA: II  Anesthesia Plan: General   Post-op Pain Management:    Induction: Intravenous  PONV Risk Score and Plan: 2 and Ondansetron and Dexamethasone  Airway Management Planned: Oral ETT  Additional Equipment: None  Intra-op Plan:   Post-operative Plan: Extubation in OR  Informed Consent: I have reviewed the patients History and Physical, chart, labs and discussed the procedure including the risks, benefits and alternatives for the proposed anesthesia with the patient or authorized representative who has indicated his/her understanding and acceptance.     Dental advisory given  Plan Discussed with: CRNA  Anesthesia Plan Comments:         Anesthesia Quick Evaluation

## 2019-07-21 NOTE — H&P (Signed)
Subjective: Patient is a 73 y.o. right-handed white female who is admitted for treatment of multilevel spondylitic cervical disc herniations with resulting bilateral cervical radiculopathy.  She has had pain into the shoulder, around the scapula, and down to the upper extremity, worse on the left than the right.  She has not improved with symptomatic treatment including heat, NSAIDs, lidocaine patch, etc.  MRI reveals advanced degenerative changes with spondylotic disc herniations, worst at the C3-4, C4-5, C5-6 levels.  She is admitted now for a 3 level C3-4, C4-5, and C5-6 anterior cervical decompression arthrodesis with structural allograft and cervical plating.  Patient Active Problem List   Diagnosis Date Noted  . Family history of colon cancer in mother 12/18/2017  . Pain in elbow 11/07/2016  . Candidiasis of breast 07/20/2015  . Atrophic vaginitis 07/20/2015  . Constipation 06/20/2015  . Skin lesion of face 06/20/2015  . Tinea corporis 05/19/2015  . Allergic rhinitis 02/06/2015  . Arthritis 02/06/2015  . BP (high blood pressure) 02/06/2015  . OP (osteoporosis) 02/06/2015  . Shoulder strain 02/06/2015  . Dermatophytosis of groin 02/06/2015  . Candida vaginitis 02/06/2015   Past Medical History:  Diagnosis Date  . Allergy   . Arthritis    hands  . Family history of adverse reaction to anesthesia    sister but not sure of what happened  . Hypertension   . Osteoporosis   . Varicose veins    Mild    Past Surgical History:  Procedure Laterality Date  . ABDOMINAL HYSTERECTOMY    . BACK SURGERY    . COLONOSCOPY N/A 01/05/2015   Procedure: COLONOSCOPY;  Surgeon: Lucilla Lame, MD;  Location: [facility];  Service: Gastroenterology;  Laterality: N/A;  . DILATION AND CURETTAGE OF UTERUS    . TOOTH EXTRACTION    . TUBAL LIGATION      Medications Prior to Admission  Medication Sig Dispense Refill Last Dose  . acetaminophen (TYLENOL) 650 MG CR tablet Take 1,300 mg by mouth  every 8 (eight) hours as needed for pain.    07/20/2019 at Unknown time  . aspirin 81 MG tablet Take 81 mg by mouth daily.    07/07/2019  . CALCIUM CITRATE PO Take 750 mg by mouth daily.    07/19/2019  . Cholecalciferol (VITAMIN D-3 PO) Take 1,000 Units by mouth daily.    07/19/2019  . diphenhydramine-acetaminophen (TYLENOL PM) 25-500 MG TABS tablet Take 2 tablets by mouth at bedtime.   07/18/2019  . estradiol (ESTRACE) 0.5 MG tablet TAKE ONE TABLET EVERY DAY 90 tablet 1 07/20/2019 at Unknown time  . gabapentin (NEURONTIN) 600 MG tablet Take 600-900 mg by mouth 2 (two) times daily. Take 600 mg by mouth in the morning and 900 mg at night   07/20/2019 at Unknown time  . losartan (COZAAR) 50 MG tablet TAKE ONE TABLET BY MOUTH EVERY MORNING (Patient taking differently: Take 50 mg by mouth daily. ) 90 tablet 1 07/20/2019 at Unknown time  . Multiple Vitamin (MULTIVITAMIN) capsule Take 1 capsule by mouth daily.    07/07/2019  . Omega 3 1000 MG CAPS Take 1,000-2,000 mg by mouth See admin instructions. Take 1000 mg by mouth in the morning and 2000 mg at night   07/07/2019  . traMADol (ULTRAM) 50 MG tablet Take 50 mg by mouth every 6 (six) hours as needed for moderate pain.   07/19/2019  . vitamin C (ASCORBIC ACID) 500 MG tablet Take 500 mg by mouth daily.    07/07/2019  .  lidocaine (LIDODERM) 5 % Place 1 patch onto the skin daily. Remove & Discard patch within 12 hours or as directed by MD (Patient not taking: Reported on 07/09/2019) 10 patch 0 Not Taking at Unknown time   Allergies  Allergen Reactions  . Lisinopril Anaphylaxis  . Shrimp [Shellfish Allergy] Anaphylaxis  . Aspercreme [Trolamine Salicylate] Rash  . Lidocaine Rash    Patches broke her skin out    Social History   Tobacco Use  . Smoking status: Never Smoker  . Smokeless tobacco: Never Used  Substance Use Topics  . Alcohol use: No    Family History  Problem Relation Age of Onset  . Cancer Mother        colon cancer  . Alzheimer's  disease Father   . Heart disease Father   . Hypertension Father   . Cancer Sister        uterine  . Breast cancer Neg Hx      Review of Systems Pertinent items noted in HPI and remainder of comprehensive ROS otherwise negative.  Objective: Vital signs in last 24 hours: Temp:  [97.9 F (36.6 C)] 97.9 F (36.6 C) (11/18 0652) Pulse Rate:  [81] 81 (11/18 0652) Resp:  [19] 19 (11/18 0652) BP: (163)/(79) 163/79 (11/18 0652) SpO2:  [97 %] 97 % (11/18 0652) Weight:  [62.5 kg] 62.5 kg (11/18 [id number])  EXAM: Patient is a well-developed well-nourished white female in no acute distress.   Lungs are clear to auscultation , the patient has symmetrical respiratory excursion. Heart has a regular rate and rhythm normal S1 and S2 no murmur.   Abdomen is soft nontender nondistended bowel sounds are present. Extremity examination shows no clubbing cyanosis or edema. Motor examination shows 5 over 5 strength in the upper extremities including the deltoid biceps triceps and intrinsics and grip. Sensation is intact to pinprick throughout the digits of the upper extremities. Reflexes are symmetrical and without evidence of pathologic reflexes. Patient has a normal gait and stance.  Data Review:CBC    Component Value Date/Time   WBC 6.0 07/19/2019 1300   RBC 4.21 07/19/2019 1300   HGB 13.5 07/19/2019 1300   HGB 11.6 03/01/2019 1137   HCT 41.6 07/19/2019 1300   HCT 34.2 03/01/2019 1137   PLT 330 07/19/2019 1300   PLT 305 03/01/2019 1137   MCV 98.8 07/19/2019 1300   MCV 94 03/01/2019 1137   MCH 32.1 07/19/2019 1300   MCHC 32.5 07/19/2019 1300   RDW 12.0 07/19/2019 1300   RDW 12.2 03/01/2019 1137   LYMPHSABS 2.6 03/01/2019 1137   EOSABS 0.1 03/01/2019 1137   BASOSABS 0.0 03/01/2019 1137                          BMET    Component Value Date/Time   NA 137 07/19/2019 1300   NA 135 03/01/2019 1137   K 4.3 07/19/2019 1300   CL 98 07/19/2019 1300   CO2 24 07/19/2019 1300   GLUCOSE 93 07/19/2019  1300   BUN 7 (L) 07/19/2019 1300   BUN 8 03/01/2019 1137   CREATININE 0.59 07/19/2019 1300   CALCIUM 9.7 07/19/2019 1300   GFRNONAA >60 07/19/2019 1300   GFRAA >60 07/19/2019 1300     Assessment/Plan: Patient with persistent neck pain and bilateral cervical radiculopathy, left worse than right, who is admitted for a 3 level ACDF.  I've discussed with the patient the nature of his condition, the nature the surgical  procedure, the typical length of surgery, hospital stay, and overall recuperation. We discussed limitations postoperatively. I discussed risks of surgery including risks of infection, bleeding, possibly need for transfusion, the risk of nerve root dysfunction with pain, weakness, numbness, or paresthesias, the risk of spinal cord dysfunction with paralysis of all 4 limbs and quadriplegia, and the risk of dural tear and CSF leakage and possible need for further surgery, the risk of esophageal dysfunction causing dysphagia and the risk of laryngeal dysfunction causing hoarseness of the voice, the risk of failure of the arthrodesis and the possible need for further surgery, and the risk of anesthetic complications including myocardial infarction, stroke, pneumonia, and death. We also discussed the need for postoperative immobilization in a cervical collar. Understanding all this the patient does wish to proceed with surgery and is admitted for such.  Hosie Spangle, MD 07/21/2019 7:41 AM

## 2019-07-21 NOTE — Transfer of Care (Signed)
Immediate Anesthesia Transfer of Care Note  Patient: Sarah Todd  Procedure(s) Performed: ANTERIOR CERVICAL DECOMPRESSION/DISCECTOMY FUSION CERVICAL THREE- CERVICAL FOUR, CERVICAL FOUR- CERVICAL FIVE, CERVICAL FIVE- CERVICAL SIX (N/A Spine Cervical)  Patient Location: PACU  Anesthesia Type:General  Level of Consciousness: awake and alert   Airway & Oxygen Therapy: Patient Spontanous Breathing and Patient connected to nasal cannula oxygen  Post-op Assessment: Report given to RN and Post -op Vital signs reviewed and stable  Post vital signs: Reviewed and stable  Last Vitals:  Vitals Value Taken Time  BP 153/86 07/21/19 1159  Temp    Pulse 88 07/21/19 1202  Resp 14 07/21/19 1202  SpO2 98 % 07/21/19 1202  Vitals shown include unvalidated device data.  Last Pain:  Vitals:   07/21/19 0652  TempSrc: Oral  PainSc:       Patients Stated Pain Goal: 4 ([phone])  Complications: No apparent anesthesia complications

## 2019-07-21 NOTE — Op Note (Signed)
07/21/2019  11:53 AM  PATIENT:  Sarah Todd  73 y.o. female  PRE-OPERATIVE DIAGNOSIS: Multilevel spondylitic cervical disc nation, cervical spondylosis, cervical degenerative disease, cervical stenosis, cervical radiculopathy  POST-OPERATIVE DIAGNOSIS:  Multilevel spondylitic cervical disc nation, cervical spondylosis, cervical degenerative disease, cervical stenosis, cervical radiculopathy  PROCEDURE:  Procedure(s): C3-4, C4-5, and C5-6 anterior cervical decompression and arthrodesis with structural allograft and Medtronic translational Atlantis cervical plate  SURGEON: Jovita Gamma, MD  ASSISTANTS: Kary Kos, MD  ANESTHESIA:   general  EBL:  Total I/O In: 1900 [I.V.:1900] Out: 75 [Blood:75]  BLOOD ADMINISTERED:none  COUNT: Correct per nursing staff  DICTATION: Patient was brought to the operating room placed under general endotracheal anesthesia. Patient was placed in 10 pounds of halter traction. The neck was prepped with Betadine soap and solution and draped in a sterile fashion. A obliquel incision was made on the left side of the neck paralleling the anterior border of the sternocleidomastoid.. The line of the incision was infiltrated with local anesthetic with epinephrine. Dissection was carried down thru the subcutaneous tissue and platysma, bipolar cautery was used to maintain hemostasis. Dissection was then carried out thru an avascular plane leaving the sternocleidomastoid carotid artery and jugular vein laterally and the trachea and esophagus medially. The ventral aspect of the vertebral column was identified and a localizing x-ray was taken. The C3-4, C4-5, and C5-6 levels were identified. The annulus at each level was incised and the disc space entered.  Anterior osteophytic overgrowth was removed using osteophyte removal tool, Kerrison punch, and the high-speed drill.  Discectomy was performed with micro-curettes and pituitary rongeurs. The operating microscope was  draped and brought into the field provided additional magnification illumination and visualization. Discectomy was continued posteriorly thru the disc space and then the cartilaginous endplate was removed using micro-curettes along with the high-speed drill. Posterior osteophytic overgrowth was removed at each level using the high-speed drill along with a 2 mm thin footplated Kerrison punch. Posterior longitudinal ligament along with disc herniation was carefully removed, decompressing the spinal canal and thecal sac. We then continued to remove osteophytic overgrowth and disc material decompressing the neural foramina and exiting nerve roots bilaterally. Once the decompression was completed hemostasis was established at each level with the use of Gelfoam with thrombin and bipolar cautery. The Gelfoam was removed, a thin layer of Surgifoam applied, the wound irrigated and hemostasis confirmed. We then measured the height of each intravertebral disc space level and selected a 6 millimeter in height structural allograft for each level.  They were hydrated in saline solution and then gently positioned in the intravertebral disc space and countersunk. We then selected a 55 millimeter in height Medtronic translational Atlantis cervical plate. It was positioned over the fusion construct and secured to the vertebra with 4 x 13 self-tapping screws. Each screw hole was started with the high-speed drill and then the screws placed, once all the screws were placed, the locking system was secured. The wound was irrigated with bacitracin solution checked for hemostasis which was established and confirmed. An x-ray was taken which showed the grafts in good position, and the plate and screws in good position. We then proceeded with closure. The platysma was closed with interrupted inverted 2-0 undyed Vicryl suture, the subcutaneous and subcuticular closed with interrupted inverted 3-0 undyed Vicryl suture. The skin edges were  approximated with Dermabond. Following surgery the patient was taken out of cervical traction. To be reversed and the anesthetic and taken to the recovery room for further  care.  PLAN OF CARE: Admit to inpatient   PATIENT DISPOSITION:  PACU - hemodynamically stable.   Delay start of Pharmacological VTE agent (>24hrs) due to surgical blood loss or risk of bleeding:  yes

## 2019-07-21 NOTE — Progress Notes (Signed)
Vitals:   07/21/19 1300 07/21/19 1305 07/21/19 1330 07/21/19 1607  BP: (!) 147/85  (!) 145/79 (!) 150/85  Pulse: 88  98 97  Resp: 14  16 18   Temp:  (!) 97 F (36.1 C) (!) 97.5 F (36.4 C)   TempSrc:   Oral   SpO2: 98%  94% 96%  Weight:      Height:        CBC Recent Labs    07/19/19 1300  WBC 6.0  HGB 13.5  HCT 41.6  PLT 330   BMET Recent Labs    07/19/19 1300  NA 137  K 4.3  CL 98  CO2 24  GLUCOSE 93  BUN 7*  CREATININE 0.59  CALCIUM 9.7    Patient resting comfortably in bed, has already ambulated down the halls.  Voiding well.  Wound clean and dry; no erythema, ecchymosis, swelling, or drainage.  Moving all 4 extremities well.  Plan: Encouraged to ambulate several times again this evening.  Continue to progress through postoperative recovery.  Hosie Spangle, MD 07/21/2019, 5:00 PM

## 2019-07-21 NOTE — Anesthesia Procedure Notes (Signed)
Procedure Name: Intubation Date/Time: 07/21/2019 8:23 AM Performed by: Inda Coke, CRNA Pre-anesthesia Checklist: Patient identified, Emergency Drugs available, Suction available and Patient being monitored Patient Re-evaluated:Patient Re-evaluated prior to induction Oxygen Delivery Method: Circle System Utilized Preoxygenation: Pre-oxygenation with 100% oxygen Induction Type: IV induction Ventilation: Mask ventilation without difficulty Laryngoscope Size: Glidescope and 3 Grade View: Grade I Tube type: Oral Tube size: 7.0 mm Number of attempts: 1 Airway Equipment and Method: Stylet and Oral airway Placement Confirmation: ETT inserted through vocal cords under direct vision,  positive ETCO2 and breath sounds checked- equal and bilateral Secured at: 21 cm Tube secured with: Tape Dental Injury: Teeth and Oropharynx as per pre-operative assessment  Comments: Elective glidescope intubation.

## 2019-07-22 MED ORDER — HYDROCODONE-ACETAMINOPHEN 5-325 MG PO TABS: 1.0000 | ORAL_TABLET | ORAL | 0 refills | Status: DC | PRN

## 2019-07-22 NOTE — Discharge Summary (Signed)
Physician Discharge Summary  Patient ID: Sarah Todd MRN: [id number] DOB/AGE: 75/04/1946 75 y.o.  Admit date: 07/21/2019 Discharge date: 07/22/2019  Admission Diagnoses:  Multilevel spondylitic cervical disc nation, cervical spondylosis, cervical degenerative disease, cervical stenosis, cervical radiculopathy  Discharge Diagnoses:  Multilevel spondylitic cervical disc nation, cervical spondylosis, cervical degenerative disease, cervical stenosis, cervical radiculopathy Active Problems:   HNP (herniated nucleus pulposus), cervical   Discharged Condition: good  Hospital Course: Patient was admitted, underwent a 3 level anterior cervical decompression arthrodesis for multilevel spondylitic disc herniation with resulting cervical radiculopathy bilaterally.  She has done well following surgery.  She is up and ambulating actively.  She is voiding well.  She is swallowing well.  We are discharging her to home with instructions regarding wound care and activities following discharge.  She is scheduled to follow-up with me in the office in 3 weeks with a lateral cervical spine x-ray.  Discharge Exam: Blood pressure 119/67, pulse 75, temperature 97.8 F (36.6 C), temperature source Oral, resp. rate 16, height 5\' 2"  (1.575 m), weight 62.5 kg, SpO2 96 %.  Disposition: Discharge disposition: 01-Home or Self Care       Discharge Instructions    Discharge wound care:   Complete by: As directed    Leave the wound open to air. Shower daily with the wound uncovered. Water and soapy water should run over the incision area. Do not wash directly on the incision for 2 weeks. Remove the glue after 2 weeks.   Driving Restrictions   Complete by: As directed    No driving for 2 weeks. May ride in the car locally now. May begin to drive locally in 2 weeks.   Other Restrictions   Complete by: As directed    Walk gradually increasing distances out in the fresh air at least twice a day. Walking  additional 6 times inside the house, gradually increasing distances, daily. No bending, lifting, or twisting. Perform activities between shoulder and waist height (that is at counter height when standing or table height when sitting).     Allergies as of 07/22/2019      Reactions   Lisinopril Anaphylaxis   Shrimp [shellfish Allergy] Anaphylaxis   Aspercreme [trolamine Salicylate] Rash   Lidocaine Rash   Patches broke her skin out      Medication List    STOP taking these medications   lidocaine 5 % Commonly known as: Lidoderm     TAKE these medications   acetaminophen 650 MG CR tablet Commonly known as: TYLENOL Take 1,300 mg by mouth every 8 (eight) hours as needed for pain.   aspirin 81 MG tablet Take 81 mg by mouth daily.   CALCIUM CITRATE PO Take 750 mg by mouth daily.   diphenhydramine-acetaminophen 25-500 MG Tabs tablet Commonly known as: TYLENOL PM Take 2 tablets by mouth at bedtime.   estradiol 0.5 MG tablet Commonly known as: ESTRACE TAKE ONE TABLET EVERY DAY   gabapentin 600 MG tablet Commonly known as: NEURONTIN Take 600-900 mg by mouth 2 (two) times daily. Take 600 mg by mouth in the morning and 900 mg at night   HYDROcodone-acetaminophen 5-325 MG tablet Commonly known as: NORCO/VICODIN Take 1-2 tablets by mouth every 4 (four) hours as needed (pain).   losartan 50 MG tablet Commonly known as: COZAAR TAKE ONE TABLET BY MOUTH EVERY MORNING What changed: when to take this   multivitamin capsule Take 1 capsule by mouth daily.   Omega 3 1000 MG Caps Take 1,000-2,000 mg  by mouth See admin instructions. Take 1000 mg by mouth in the morning and 2000 mg at night   traMADol 50 MG tablet Commonly known as: ULTRAM Take 50 mg by mouth every 6 (six) hours as needed for moderate pain.   vitamin C 500 MG tablet Commonly known as: ASCORBIC ACID Take 500 mg by mouth daily.   VITAMIN D-3 PO Take 1,000 Units by mouth daily.            Discharge Care  Instructions  (From admission, onward)         Start     Ordered   07/22/19 0000  Discharge wound care:    Comments: Leave the wound open to air. Shower daily with the wound uncovered. Water and soapy water should run over the incision area. Do not wash directly on the incision for 2 weeks. Remove the glue after 2 weeks.   07/22/19 0759           Signed: Hosie Spangle 07/22/2019, 7:59 AM

## 2019-07-22 NOTE — Progress Notes (Signed)
Pt doing well. Pt and husband given D/C instructions with verbal understanding. Pt's incision is clean and dry with no sign of infection. Pt's IV was removed prior to D/C. Pt D/C'd home via wheelchair @ 1115 per MD order. Pt is stable @ D/C and has no other needs at this time. Holli Humbles, RN

## 2019-07-22 NOTE — Discharge Instructions (Signed)

## 2019-07-23 ENCOUNTER — Encounter: Payer: PPO | Admitting: Physician Assistant

## 2019-07-23 ENCOUNTER — Encounter (HOSPITAL_COMMUNITY): Payer: Self-pay | Admitting: Neurosurgery

## 2019-07-25 NOTE — Anesthesia Postprocedure Evaluation (Signed)
Anesthesia Post Note  Patient: Sarah Todd  Procedure(s) Performed: ANTERIOR CERVICAL DECOMPRESSION/DISCECTOMY FUSION CERVICAL THREE- CERVICAL FOUR, CERVICAL FOUR- CERVICAL FIVE, CERVICAL FIVE- CERVICAL SIX (N/A Spine Cervical)     Patient location during evaluation: PACU Anesthesia Type: General Level of consciousness: awake and alert Pain management: pain level controlled Vital Signs Assessment: post-procedure vital signs reviewed and stable Respiratory status: spontaneous breathing, nonlabored ventilation, respiratory function stable and patient connected to nasal cannula oxygen Cardiovascular status: blood pressure returned to baseline and stable Postop Assessment: no apparent nausea or vomiting Anesthetic complications: no    Last Vitals:  Vitals:   07/22/19 0336 07/22/19 0710  BP: 129/68 119/67  Pulse: 77 75  Resp: 18 16  Temp: 36.7 C 36.6 C  SpO2: 97% 96%    Last Pain:  Vitals:   07/22/19 0745  TempSrc:   PainSc: 2                  Johnnye Sandford

## 2019-08-09 DIAGNOSIS — M5020 Other cervical disc displacement, unspecified cervical region: Secondary | ICD-10-CM | POA: Diagnosis not present

## 2019-08-09 DIAGNOSIS — Z981 Arthrodesis status: Secondary | ICD-10-CM | POA: Insufficient documentation

## 2019-08-17 ENCOUNTER — Other Ambulatory Visit: Payer: Self-pay | Admitting: Physician Assistant

## 2019-08-17 DIAGNOSIS — N951 Menopausal and female climacteric states: Secondary | ICD-10-CM

## 2019-10-09 ENCOUNTER — Other Ambulatory Visit: Payer: Self-pay | Admitting: Physician Assistant

## 2019-10-09 DIAGNOSIS — I10 Essential (primary) hypertension: Secondary | ICD-10-CM

## 2019-10-09 DIAGNOSIS — N951 Menopausal and female climacteric states: Secondary | ICD-10-CM

## 2019-10-09 NOTE — Telephone Encounter (Signed)
Requested Prescriptions  Pending Prescriptions Disp Refills  . losartan (COZAAR) 50 MG tablet [Pharmacy Med Name: LOSARTAN POTASSIUM 50 MG TAB] 90 tablet 1    Sig: TAKE ONE TABLET EVERY MORNING     Cardiovascular:  Angiotensin Receptor Blockers Failed - 10/09/2019 10:57 AM      Failed - Valid encounter within last 6 months    Recent Outpatient Visits          7 months ago Constipation, unspecified constipation type   [place], [place]   7 months ago Diarrhea, unspecified type   [facility], [place], [place]   1 year ago Essential hypertension   [place], [place], [place]   1 year ago Trochanteric bursitis of left hip   [facility] [place], [place], [place]   1 year ago Allergic rhinitis due to other allergic trigger, unspecified seasonality   [place], [place], [place]             Passed - Cr in normal range and within 180 days    Creatinine, Ser  Date Value Ref Range Status  07/19/2019 0.59 0.44 - 1.00 mg/dL Final         Passed - K in normal range and within 180 days    Potassium  Date Value Ref Range Status  07/19/2019 4.3 3.5 - 5.1 mmol/L Final         Passed - Patient is not pregnant      Passed - Last BP in normal range    BP Readings from Last 1 Encounters:  07/22/19 119/67         . estradiol (ESTRACE) 0.5 MG tablet [Pharmacy Med Name: ESTRADIOL 0.5 MG TAB] 90 tablet 0    Sig: TAKE ONE TABLET EVERY DAY     OB/GYN:  Estrogens Failed - 10/09/2019 10:57 AM      Failed - Mammogram is up-to-date per Health Maintenance      Passed - Last BP in normal range    BP Readings from Last 1 Encounters:  07/22/19 119/67         Passed - Valid encounter within last 12 months    Recent Outpatient Visits          7 months ago Constipation, unspecified constipation type   [place], [place]   7 months ago  Diarrhea, unspecified type   [facility] [place], [place], [place]   1 year ago Essential hypertension   [place], [place], [place]   1 year ago Trochanteric bursitis of left hip   [place], [place], [place]   1 year ago Allergic rhinitis due to other allergic trigger, unspecified seasonality   [place], [place], [place]

## 2019-10-12 DIAGNOSIS — Z981 Arthrodesis status: Secondary | ICD-10-CM | POA: Diagnosis not present

## 2019-10-13 DIAGNOSIS — M7541 Impingement syndrome of right shoulder: Secondary | ICD-10-CM | POA: Diagnosis not present

## 2019-10-18 DIAGNOSIS — M7541 Impingement syndrome of right shoulder: Secondary | ICD-10-CM | POA: Diagnosis not present

## 2019-10-20 DIAGNOSIS — M7541 Impingement syndrome of right shoulder: Secondary | ICD-10-CM | POA: Diagnosis not present

## 2019-10-20 DIAGNOSIS — M25411 Effusion, right shoulder: Secondary | ICD-10-CM | POA: Diagnosis not present

## 2019-10-26 DIAGNOSIS — H2513 Age-related nuclear cataract, bilateral: Secondary | ICD-10-CM | POA: Diagnosis not present

## 2019-10-27 DIAGNOSIS — M7541 Impingement syndrome of right shoulder: Secondary | ICD-10-CM | POA: Diagnosis not present

## 2019-10-27 DIAGNOSIS — M25411 Effusion, right shoulder: Secondary | ICD-10-CM | POA: Diagnosis not present

## 2019-10-30 DIAGNOSIS — Z23 Encounter for immunization: Secondary | ICD-10-CM | POA: Diagnosis not present

## 2019-11-04 DIAGNOSIS — M7541 Impingement syndrome of right shoulder: Secondary | ICD-10-CM | POA: Diagnosis not present

## 2019-11-04 DIAGNOSIS — M25411 Effusion, right shoulder: Secondary | ICD-10-CM | POA: Diagnosis not present

## 2019-11-10 DIAGNOSIS — M25411 Effusion, right shoulder: Secondary | ICD-10-CM | POA: Diagnosis not present

## 2019-11-10 DIAGNOSIS — M7541 Impingement syndrome of right shoulder: Secondary | ICD-10-CM | POA: Diagnosis not present

## 2019-11-24 DIAGNOSIS — M25511 Pain in right shoulder: Secondary | ICD-10-CM | POA: Diagnosis not present

## 2019-11-30 ENCOUNTER — Other Ambulatory Visit: Payer: Self-pay | Admitting: Orthopedic Surgery

## 2019-11-30 DIAGNOSIS — M25511 Pain in right shoulder: Secondary | ICD-10-CM

## 2019-12-10 DIAGNOSIS — M47812 Spondylosis without myelopathy or radiculopathy, cervical region: Secondary | ICD-10-CM | POA: Diagnosis not present

## 2019-12-10 DIAGNOSIS — Z981 Arthrodesis status: Secondary | ICD-10-CM | POA: Diagnosis not present

## 2019-12-10 DIAGNOSIS — M5030 Other cervical disc degeneration, unspecified cervical region: Secondary | ICD-10-CM | POA: Diagnosis not present

## 2019-12-11 ENCOUNTER — Ambulatory Visit
Admission: RE | Admit: 2019-12-11 | Discharge: 2019-12-11 | Disposition: A | Discharge status: Home / Self Care | Payer: PPO | Source: Ambulatory Visit | Attending: Orthopedic Surgery | Admitting: Orthopedic Surgery

## 2019-12-11 ENCOUNTER — Other Ambulatory Visit: Payer: Self-pay

## 2019-12-11 DIAGNOSIS — M25511 Pain in right shoulder: Secondary | ICD-10-CM | POA: Insufficient documentation

## 2019-12-11 IMAGING — MR MR SHOULDER*R* W/O CM
4 of 5 series · 31 of 40 positions shown · non-contrast
Comparison: None.

CLINICAL DATA: Right shoulder pain for 1 year

EXAM:
MRI OF THE RIGHT SHOULDER WITHOUT CONTRAST
TECHNIQUE: Multiplanar, multisequence MR imaging of the shoulder was performed.
No intravenous contrast was administered.

[Series 5: PD fat-sat · axial · right · 4.0mm · 0.55mm/px · z∈[-47,+83]mm · 8 of 28 slices shown (1 of 2)]
[im 1/28]
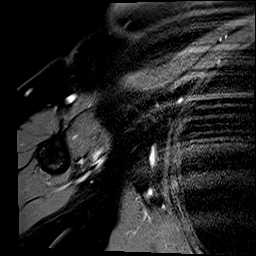
[im 4/28]
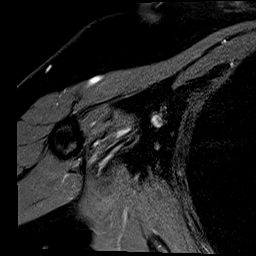
[im 10/28]
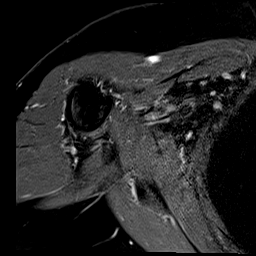
[im 13/28]
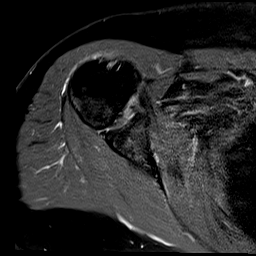
[im 16/28]
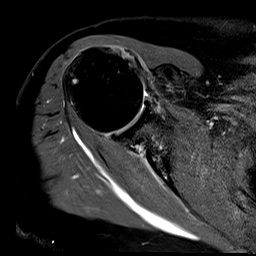
[im 19/28]
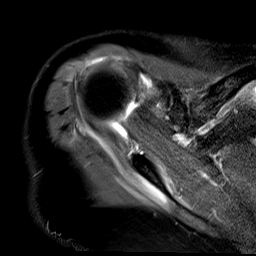
[im 25/28]
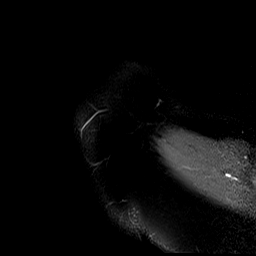
[im 28/28]
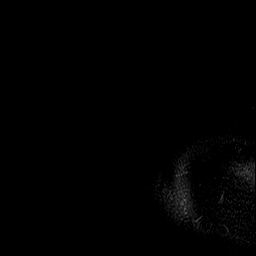

[Series 6: PD fat-sat · oblique · right · 4.0mm · 0.44mm/px · 8 of 26 slices shown (2 of 2)]
[im 1/26]
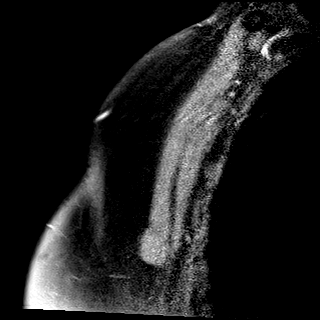
[im 4/26]
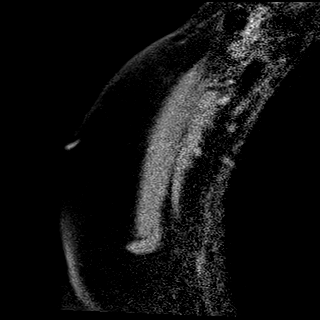
[im 8/26]
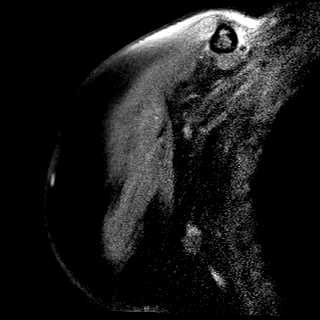
[im 11/26]
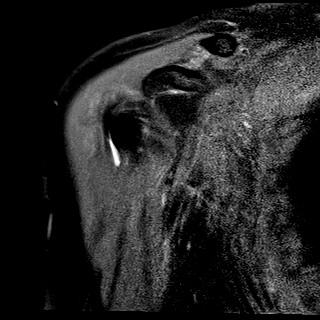
[im 15/26]
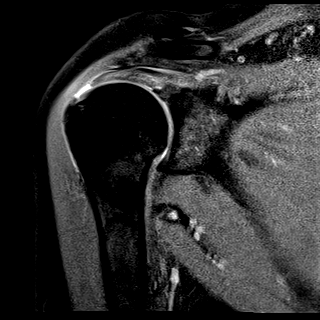
[im 18/26]
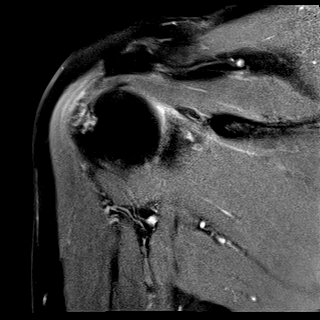
[im 22/26]
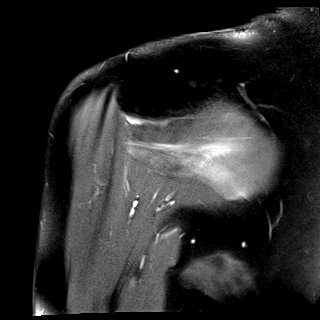
[im 26/26]
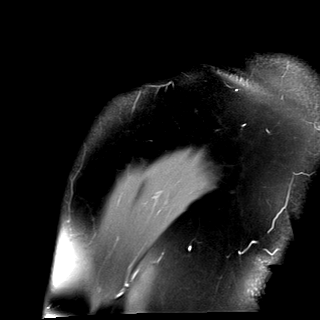

[Series 7: T2 fat-sat · oblique · right · 4.0mm · 0.44mm/px · 8 of 26 slices shown (1 of 2)]
[im 1/26]
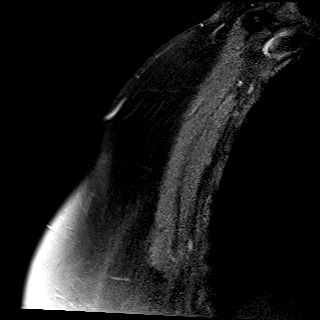
[im 4/26]
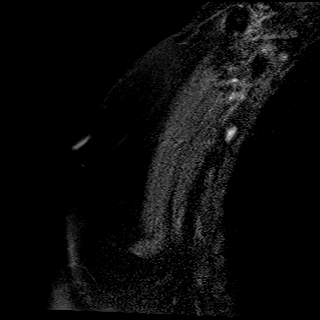
[im 8/26]
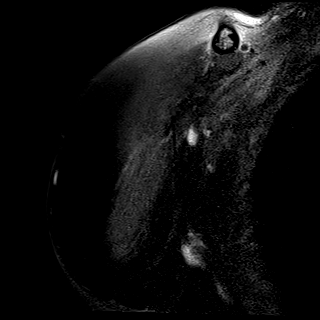
[im 11/26]
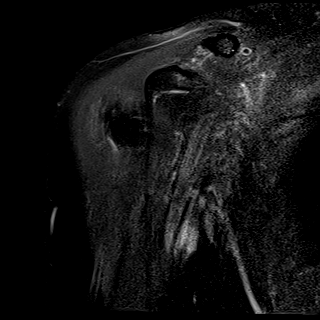
[im 15/26]
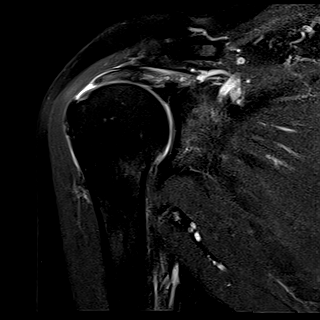
[im 18/26]
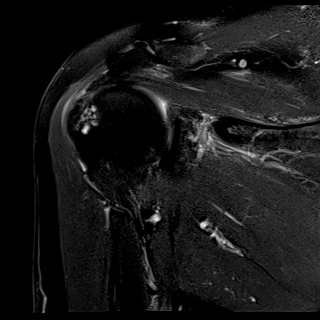
[im 22/26]
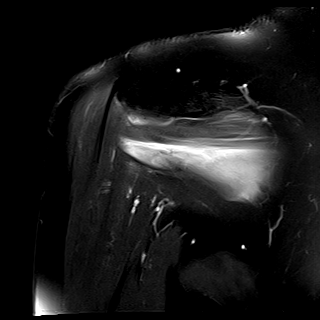
[im 26/26]
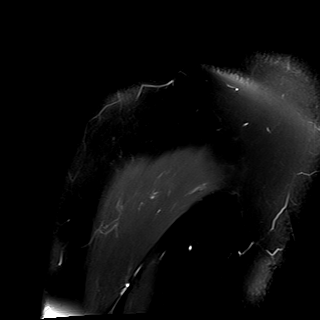

[Series 8: T2 fat-sat · oblique · right · 4.0mm · 0.23mm/px · 7 of 22 slices shown (2 of 2)]
[im 1/22]
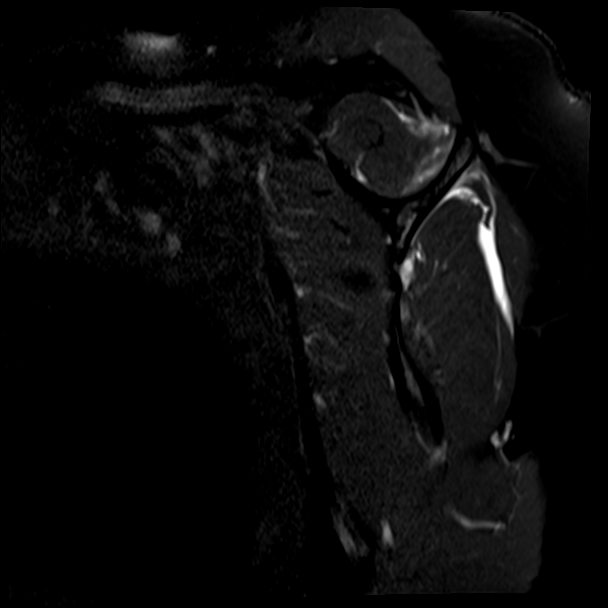
[im 4/22]
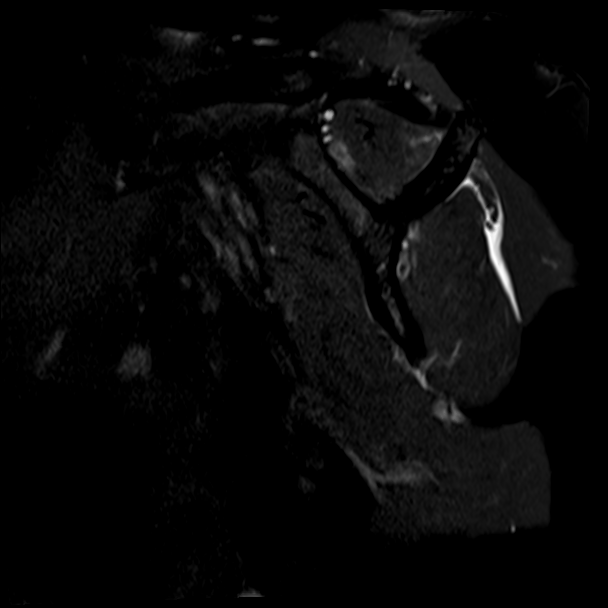
[im 8/22]
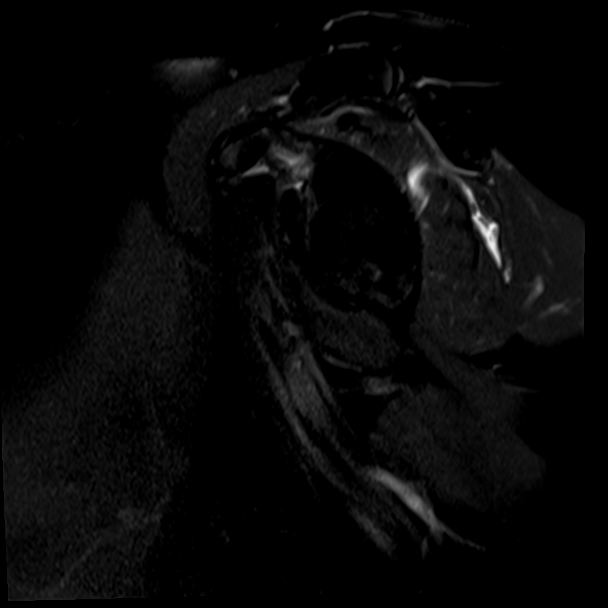
[im 11/22]
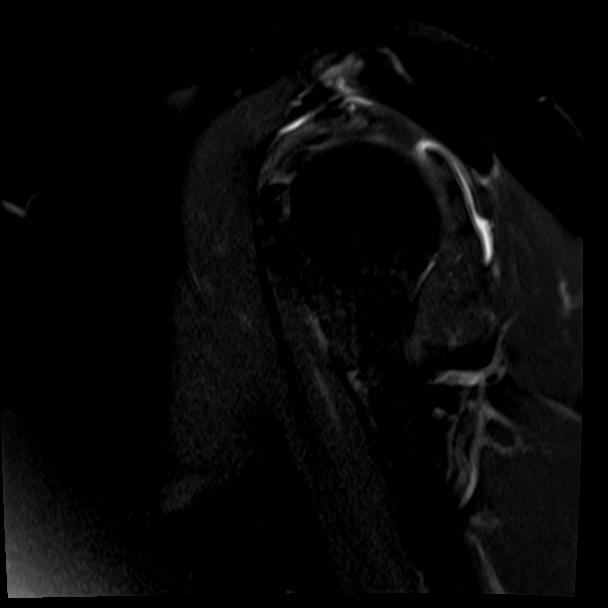
[im 15/22]
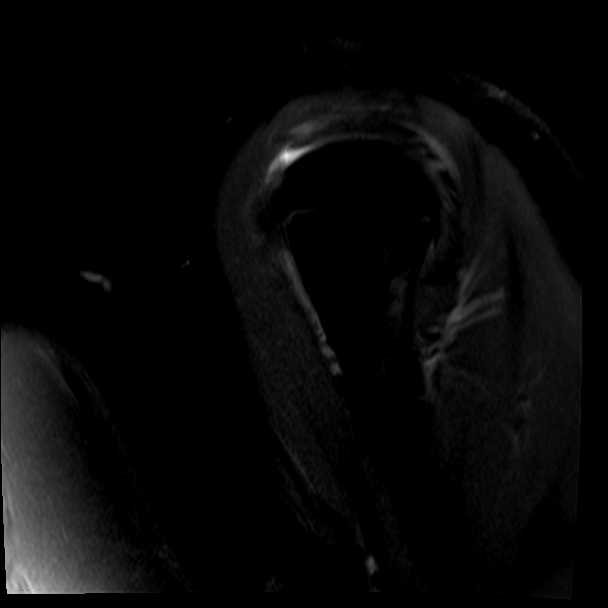
[im 18/22]
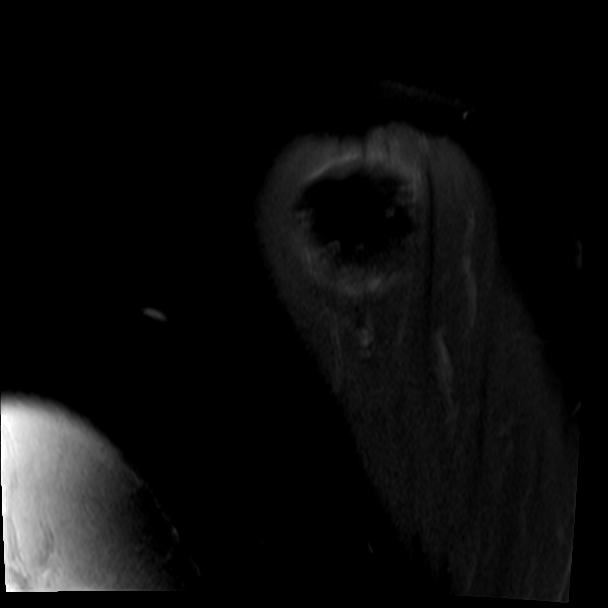
[im 22/22]
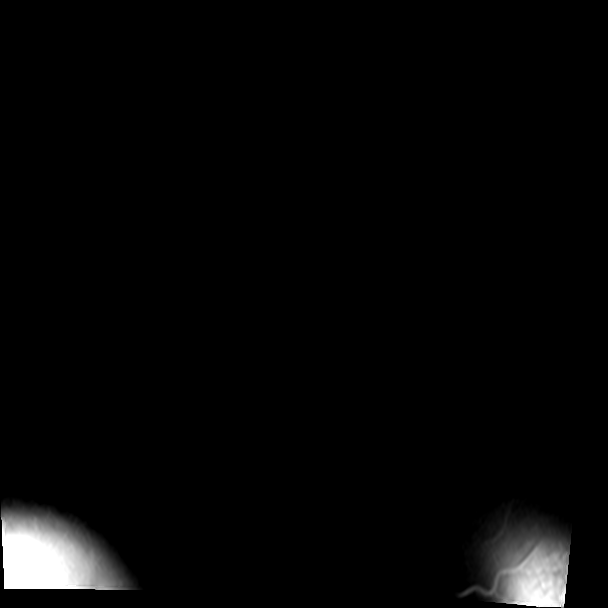

[31 of 40 positions shown; findings below may reference images not displayed]

FINDINGS: Despite efforts by the technologist and patient, motion artifact is
present on today's exam and could not be eliminated. This reduces
exam sensitivity and specificity.

Rotator cuff: Full-thickness, nearly full width tear of the distal
supraspinatus tendon with 1.0 cm of retraction of the torn portion
of the tendon on image [DATE]. I suspect a small posterior portion of
the supraspinatus tendon still attaches to the humeral head although
this is equivocal.

Mild infraspinatus and mild subscapularis tendinopathy.

Muscles: There is low-level edema within the posterior portion of
the supraspinatus muscle primarily along its margins.

Biceps long head:  Unremarkable

Acromioclavicular Joint: Moderate spurring and mild subcortical
marrow edema. Type II acromion. As expected there is fluid in the
subacromial subdeltoid bursa.

Glenohumeral Joint: Mild degenerative chondral thinning.

Labrum:  Grossly unremarkable.

Bones: Degenerative subcortical cystic lesions posteriorly along the
anatomic neck of the humerus. Old right mid clavicular fracture,
healed.

Other: No supplemental non-categorized findings.
IMPRESSION: 1. Full-thickness, nearly full width tear of the distal
supraspinatus tendon with 1.0 cm of retraction of the torn portion
of the tendon. There is low-level edema within the posterior portion
of the supraspinatus muscle.
2. Mild infraspinatus and subscapularis tendinopathy.
3. Moderate degenerative AC joint arthropathy and mild degenerative
glenohumeral arthropathy.
4. Old right mid clavicular fracture, healed.
5. Despite efforts by the technologist and patient, motion artifact
is present on today's exam and could not be eliminated. This reduces
exam sensitivity and specificity.

## 2019-12-20 ENCOUNTER — Other Ambulatory Visit: Payer: Self-pay

## 2019-12-20 ENCOUNTER — Encounter: Payer: Self-pay | Admitting: Physician Assistant

## 2019-12-20 ENCOUNTER — Ambulatory Visit (INDEPENDENT_AMBULATORY_CARE_PROVIDER_SITE_OTHER): Payer: PPO | Admitting: Physician Assistant

## 2019-12-20 VITALS — BP 106/73 | HR 91 | Temp 96.9°F | Resp 16 | Ht 62.0 in | Wt 141.0 lb

## 2019-12-20 DIAGNOSIS — Z0000 Encounter for general adult medical examination without abnormal findings: Secondary | ICD-10-CM

## 2019-12-20 DIAGNOSIS — Z1211 Encounter for screening for malignant neoplasm of colon: Secondary | ICD-10-CM

## 2019-12-20 DIAGNOSIS — I10 Essential (primary) hypertension: Secondary | ICD-10-CM | POA: Diagnosis not present

## 2019-12-20 DIAGNOSIS — N3941 Urge incontinence: Secondary | ICD-10-CM

## 2019-12-20 DIAGNOSIS — N3281 Overactive bladder: Secondary | ICD-10-CM | POA: Diagnosis not present

## 2019-12-20 DIAGNOSIS — L989 Disorder of the skin and subcutaneous tissue, unspecified: Secondary | ICD-10-CM | POA: Diagnosis not present

## 2019-12-20 DIAGNOSIS — R202 Paresthesia of skin: Secondary | ICD-10-CM | POA: Diagnosis not present

## 2019-12-20 DIAGNOSIS — Z1239 Encounter for other screening for malignant neoplasm of breast: Secondary | ICD-10-CM | POA: Diagnosis not present

## 2019-12-20 MED ORDER — TRIAMCINOLONE ACETONIDE 0.1 % EX CREA: 1.0000 "application " | TOPICAL_CREAM | Freq: Two times a day (BID) | CUTANEOUS | 0 refills | Status: DC

## 2019-12-20 MED ORDER — OXYBUTYNIN CHLORIDE ER 10 MG PO TB24: 10.0000 mg | ORAL_TABLET | Freq: Every day | ORAL | 1 refills | Status: DC

## 2019-12-20 NOTE — Progress Notes (Signed)
Annual Wellness Visit     Patient: Sarah Todd, Female    DOB: 20-Aug-1946, 74 y.o.   MRN: [id number] Visit Date: 12/22/2019  Today's Provider: Mar Daring, PA-C  Subjective:    Chief Complaint  Patient presents with  . Annual Exam   Sarah Todd is a 74 y.o. female who presents today for her Annual Wellness Visit. She reports consuming a general diet. The patient does not participate in regular exercise at present. She does walk She generally feels well. She reports sleeping fairly well. She does have additional problems to discuss today.   HPI Incontinence: She reports that she has been having trouble with not holding her urine.Reports that is worsening and she is not able to hold her urine and she just urinates everywhere. Reports that last night she had to change her pajamas twice. The urgency to go to the bathroom is waking her up. She is using pads during the day.  Patient Active Problem List   Diagnosis Date Noted  . HNP (herniated nucleus pulposus), cervical 07/21/2019  . Family history of colon cancer in mother 12/18/2017  . Pain in elbow 11/07/2016  . Candidiasis of breast 07/20/2015  . Atrophic vaginitis 07/20/2015  . Constipation 06/20/2015  . Skin lesion of face 06/20/2015  . Tinea corporis 05/19/2015  . Allergic rhinitis 02/06/2015  . Arthritis 02/06/2015  . BP (high blood pressure) 02/06/2015  . OP (osteoporosis) 02/06/2015  . Shoulder strain 02/06/2015  . Dermatophytosis of groin 02/06/2015  . Candida vaginitis 02/06/2015   Past Medical History:  Diagnosis Date  . Allergy   . Arthritis    hands  . Family history of adverse reaction to anesthesia    sister but not sure of what happened  . Hypertension   . Osteoporosis   . Varicose veins    Mild   Past Surgical History:  Procedure Laterality Date  . ABDOMINAL HYSTERECTOMY    . ANTERIOR CERVICAL DECOMP/DISCECTOMY FUSION N/A 07/21/2019   Procedure: ANTERIOR CERVICAL  DECOMPRESSION/DISCECTOMY FUSION CERVICAL THREE- CERVICAL FOUR, CERVICAL FOUR- CERVICAL FIVE, CERVICAL FIVE- CERVICAL SIX;  Surgeon: Jovita Gamma, MD;  Location: [place];  Service: Neurosurgery;  Laterality: N/A;  ANTERIOR CERVICAL DECOMPRESSION/DISCECTOMY FUSION CERVICAL 3- CERVICAL 4, CERVICAL 4- CERVICAL 5, CERVICAL 5- CERVICAL 6  . BACK SURGERY    . COLONOSCOPY N/A 01/05/2015   Procedure: COLONOSCOPY;  Surgeon: Lucilla Lame, MD;  Location: [place];  Service: Gastroenterology;  Laterality: N/A;  . DILATION AND CURETTAGE OF UTERUS    . TOOTH EXTRACTION    . TUBAL LIGATION     Social History   Tobacco Use  . Smoking status: Never Smoker  . Smokeless tobacco: Never Used  Substance Use Topics  . Alcohol use: No  . Drug use: No   Family Status  Relation Name Status  . Mother  Deceased at age 44       old age  . Father  Deceased at age 18  . Sister  Alive  . Brother  Alive  . Neg Hx  (Not Specified)   Allergies  Allergen Reactions  . Lisinopril Anaphylaxis  . Shrimp [Shellfish Allergy] Anaphylaxis  . Aspercreme [Trolamine Salicylate] Rash  . Lidocaine Rash    Patches broke her skin out       Medications: Outpatient Medications Prior to Visit  Medication Sig  . acetaminophen (TYLENOL) 650 MG CR tablet Take 1,300 mg by mouth every 8 (eight) hours as needed for pain.   Marland Kitchen  aspirin 81 MG tablet Take 81 mg by mouth daily.   Marland Kitchen CALCIUM CITRATE PO Take 750 mg by mouth daily.   . Cholecalciferol (VITAMIN D-3 PO) Take 1,000 Units by mouth daily.   . diphenhydramine-acetaminophen (TYLENOL PM) 25-500 MG TABS tablet Take 2 tablets by mouth at bedtime.  Marland Kitchen estradiol (ESTRACE) 0.5 MG tablet TAKE ONE TABLET EVERY DAY  . gabapentin (NEURONTIN) 600 MG tablet Take 600-900 mg by mouth 2 (two) times daily. Take 600 mg by mouth in the morning and 900 mg at night  . losartan (COZAAR) 50 MG tablet TAKE ONE TABLET EVERY MORNING  . Multiple Vitamin (MULTIVITAMIN) capsule Take 1 capsule by  mouth daily.   . Omega 3 1000 MG CAPS Take 1,000-2,000 mg by mouth See admin instructions. Take 1000 mg by mouth in the morning and 2000 mg at night  . vitamin C (ASCORBIC ACID) 500 MG tablet Take 500 mg by mouth daily.   . [DISCONTINUED] HYDROcodone-acetaminophen (NORCO/VICODIN) 5-325 MG tablet Take 1-2 tablets by mouth every 4 (four) hours as needed (pain).  . [DISCONTINUED] traMADol (ULTRAM) 50 MG tablet Take 50 mg by mouth every 6 (six) hours as needed for moderate pain.   No facility-administered medications prior to visit.    Allergies  Allergen Reactions  . Lisinopril Anaphylaxis  . Shrimp [Shellfish Allergy] Anaphylaxis  . Aspercreme [Trolamine Salicylate] Rash  . Lidocaine Rash    Patches broke her skin out    Patient Care Team: Mar Daring, PA-C as PCP - General (Family Medicine) Arelia Sneddon, [place] as Consulting Physician (Optometry) Jovita Gamma, MD as Consulting Physician (Neurosurgery) Beverly Gust, MD (Otolaryngology)  Review of Systems  Constitutional: Negative.   HENT: Negative.   Eyes: Negative.   Respiratory: Negative.   Cardiovascular: Negative.   Gastrointestinal: Negative.   Endocrine: Positive for polyuria.  Genitourinary: Positive for frequency.  Musculoskeletal: Positive for neck pain.  Skin: Positive for rash ("on the right side of her face").  Allergic/Immunologic: Negative.   Neurological: Negative.   Hematological: Bruises/bleeds easily ("takes Aspirin").  Psychiatric/Behavioral: Positive for sleep disturbance ("Frequent urination").    Last CBC Lab Results  Component Value Date   WBC 5.8 12/20/2019   HGB 13.1 12/20/2019   HCT 38.1 12/20/2019   MCV 95 12/20/2019   MCH 32.7 12/20/2019   RDW 13.0 12/20/2019   PLT 335 [id number]   Last metabolic panel Lab Results  Component Value Date   GLUCOSE 88 12/20/2019   NA 133 (L) 12/20/2019   K 4.4 12/20/2019   CL 93 (L) 12/20/2019   CO2 24 12/20/2019   BUN 9 12/20/2019    CREATININE 0.63 12/20/2019   GFRNONAA 88 12/20/2019   GFRAA 101 12/20/2019   CALCIUM 10.2 12/20/2019   PROT 7.2 12/20/2019   ALBUMIN 4.6 12/20/2019   LABGLOB 2.6 12/20/2019   AGRATIO 1.8 12/20/2019   BILITOT 0.4 12/20/2019   ALKPHOS 43 12/20/2019   AST 24 12/20/2019   ALT 18 12/20/2019   ANIONGAP 15 07/19/2019   Last hemoglobin A1c Lab Results  Component Value Date   HGBA1C 5.4 12/18/2017       Objective:    Vitals: BP 106/73 (BP Location: Left Arm, Patient Position: Sitting, Cuff Size: Large)   Pulse 91   Temp (!) 96.9 F (36.1 C) (Temporal)   Resp 16   Ht 5\' 2"  (1.575 m)   Wt 141 lb (64 kg)   BMI 25.79 kg/m  BP Readings from Last 3 Encounters:  12/20/19 106/73  07/22/19 119/67  07/19/19 (!) 164/98   Wt Readings from Last 3 Encounters:  12/20/19 141 lb (64 kg)  07/21/19 137 lb 11.2 oz (62.5 kg)  07/19/19 137 lb 11.2 oz (62.5 kg)      Physical Exam Vitals reviewed.  Constitutional:      General: She is not in acute distress.    Appearance: Normal appearance. She is well-developed and normal weight. She is not ill-appearing or diaphoretic.  HENT:     Head: Normocephalic and atraumatic.     Right Ear: Tympanic membrane, ear canal and external ear normal.     Left Ear: Tympanic membrane, ear canal and external ear normal.     Nose: Nose normal.     Mouth/Throat:     Mouth: Mucous membranes are moist.     Pharynx: Oropharynx is clear. No oropharyngeal exudate or posterior oropharyngeal erythema.  Eyes:     General: No scleral icterus.       Right eye: No discharge.        Left eye: No discharge.     Extraocular Movements: Extraocular movements intact.     Conjunctiva/sclera: Conjunctivae normal.     Pupils: Pupils are equal, round, and reactive to light.  Neck:     Thyroid: No thyromegaly.     Vascular: No carotid bruit or JVD.     Trachea: No tracheal deviation.  Cardiovascular:     Rate and Rhythm: Normal rate and regular rhythm.     Pulses: Normal  pulses.     Heart sounds: Normal heart sounds. No murmur. No friction rub. No gallop.   Pulmonary:     Effort: Pulmonary effort is normal. No respiratory distress.     Breath sounds: Normal breath sounds. No wheezing or rales.  Chest:     Chest wall: No mass or tenderness.     Breasts:        Right: Normal.        Left: Normal.  Abdominal:     General: Abdomen is flat. Bowel sounds are normal. There is no distension.     Palpations: Abdomen is soft. There is no mass.     Tenderness: There is no abdominal tenderness. There is no guarding or rebound.  Musculoskeletal:        General: No tenderness. Normal range of motion.     Cervical back: Normal range of motion and neck supple.     Right lower leg: No edema.     Left lower leg: No edema.  Lymphadenopathy:     Cervical: No cervical adenopathy.     Upper Body:     Right upper body: No supraclavicular, axillary or pectoral adenopathy.     Left upper body: No supraclavicular, axillary or pectoral adenopathy.  Skin:    General: Skin is warm and dry.     Capillary Refill: Capillary refill takes less than 2 seconds.     Findings: Lesion present. No rash.       Neurological:     General: No focal deficit present.     Mental Status: She is alert and oriented to person, place, and time. Mental status is at baseline.  Psychiatric:        Mood and Affect: Mood normal.        Behavior: Behavior normal.        Thought Content: Thought content normal.        Judgment: Judgment normal.      Most recent functional status assessment:  In your present state of health, do you have any difficulty performing the following activities: 12/20/2019  Hearing? N  Vision? Y  Comment -  Difficulty concentrating or making decisions? N  Walking or climbing stairs? N  Dressing or bathing? N  Doing errands, shopping? N  Preparing Food and eating ? -  Using the Toilet? -  In the past six months, have you accidently leaked urine? -  Do you have  problems with loss of bowel control? -  Managing your Medications? -  Managing your Finances? -  Housekeeping or managing your Housekeeping? -  Some recent data might be hidden    Most recent fall risk assessment: Fall Risk  12/20/2019  Falls in the past year? 0  Number falls in past yr: 0  Injury with Fall? 0  Follow up Falls evaluation completed     Most recent depression screenings: PHQ 2/9 Scores 12/20/2019 02/23/2019  PHQ - 2 Score 0 0  PHQ- 9 Score - 0    Most recent cognitive screening: 6CIT Screen 12/20/2019  What Year? 0 points  What month? 0 points  What time? 0 points  Count back from 20 0 points  Months in reverse 0 points  Repeat phrase 6 points  Total Score 6    Results for orders placed or performed in visit on 12/20/19  CBC with Differential/Platelet  Result Value Ref Range   WBC 5.8 3.4 - 10.8 x10E3/uL   RBC 4.01 3.77 - 5.28 x10E6/uL   Hemoglobin 13.1 11.1 - 15.9 g/dL   Hematocrit 38.1 34.0 - 46.6 %   MCV 95 79 - 97 fL   MCH 32.7 26.6 - 33.0 pg   MCHC 34.4 31.5 - 35.7 g/dL   RDW 13.0 11.7 - 15.4 %   Platelets 335 150 - 450 x10E3/uL   Neutrophils 40 Not Estab. %   Lymphs 50 Not Estab. %   Monocytes 8 Not Estab. %   Eos 1 Not Estab. %   Basos 1 Not Estab. %   Neutrophils Absolute 2.3 1.4 - 7.0 x10E3/uL   Lymphocytes Absolute 2.9 0.7 - 3.1 x10E3/uL   Monocytes Absolute 0.5 0.1 - 0.9 x10E3/uL   EOS (ABSOLUTE) 0.1 0.0 - 0.4 x10E3/uL   Basophils Absolute 0.0 0.0 - 0.2 x10E3/uL   Immature Granulocytes 0 Not Estab. %   Immature Grans (Abs) 0.0 0.0 - 0.1 x10E3/uL  Lipid panel  Result Value Ref Range   Cholesterol, Total 189 100 - 199 mg/dL   Triglycerides 171 (H) 0 - 149 mg/dL   HDL 75 >39 mg/dL   VLDL Cholesterol Cal 29 5 - 40 mg/dL   LDL Chol Calc (NIH) 85 0 - 99 mg/dL   Chol/HDL Ratio 2.5 0.0 - 4.4 ratio  Comprehensive Metabolic Panel (CMET)  Result Value Ref Range   Glucose 88 65 - 99 mg/dL   BUN 9 8 - 27 mg/dL   Creatinine, Ser 0.63 0.57 -  1.00 mg/dL   GFR calc non Af Amer 88 >59 mL/min/1.73   GFR calc Af Amer 101 >59 mL/min/1.73   BUN/Creatinine Ratio 14 12 - 28   Sodium 133 (L) 134 - 144 mmol/L   Potassium 4.4 3.5 - 5.2 mmol/L   Chloride 93 (L) 96 - 106 mmol/L   CO2 24 20 - 29 mmol/L   Calcium 10.2 8.7 - 10.3 mg/dL   Total Protein 7.2 6.0 - 8.5 g/dL   Albumin 4.6 3.7 - 4.7 g/dL   Globulin, Total 2.6 1.5 -  4.5 g/dL   Albumin/Globulin Ratio 1.8 1.2 - 2.2   Bilirubin Total 0.4 0.0 - 1.2 mg/dL   Alkaline Phosphatase 43 39 - 117 IU/L   AST 24 0 - 40 IU/L   ALT 18 0 - 32 IU/L  Vitamin D (25 hydroxy)  Result Value Ref Range   Vit D, 25-Hydroxy 38.8 30.0 - 100.0 ng/mL  B12 and Folate Panel  Result Value Ref Range   Vitamin B-12 703 232 - 1,245 pg/mL   Folate >20.0 >3.0 ng/mL      Assessment & Plan:      Annual wellness visit done today including the all of the following: Reviewed patient's Family Medical History Reviewed and updated list of patient's medical providers Assessment of cognitive impairment was done Assessed patient's functional ability Established a written schedule for health screening services Health Risk Assessment Completed and Reviewed  Exercise Activities and Dietary recommendations Goals    . DIET - INCREASE WATER INTAKE     Recommend increasing water intake to 6 glasses a day.        Immunization History  Administered Date(s) Administered  . Influenza,inj,Quad PF,6+ Mos 06/15/2015  . Influenza-Unspecified 05/13/2017, 06/09/2018, 05/18/2019  . Pneumococcal Conjugate-13 11/17/2014  . Pneumococcal Polysaccharide-23 08/03/2013  . Tdap 10/16/2016  . Zoster Recombinat (Shingrix) 09/09/2018, 03/09/2019    Health Maintenance  Topic Date Due  . COVID-19 Vaccine (1) Never done  . COLONOSCOPY  01/05/2020  . INFLUENZA VACCINE  04/02/2020  . TETANUS/TDAP  10/16/2026  . DEXA SCAN  Completed  . Hepatitis C Screening  Completed  . PNA vac Low Risk Adult  Completed     Discussed health  benefits of physical activity, and encouraged her to engage in regular exercise appropriate for her age and condition.    1. Annual physical exam Normal physical exam today. Will check labs as below and f/u pending lab results. If labs are stable and WNL she will not need to have these rechecked for one year at her next annual physical exam. She is to call the office in the meantime if she has any acute issue, questions or concerns. - CBC with Differential/Platelet - Lipid panel - Comprehensive Metabolic Panel (CMET)  2. Encounter for breast cancer screening using non-mammogram modality Breast exam today was normal. There is no family history of breast cancer. She does perform regular self breast exams. Mammogram was ordered as below. Information for [facility] clinic was given to patient so she may schedule her mammogram at her convenience. - MM 3D SCREEN BREAST BILATERAL; Future  3. Colon cancer screening Due for repeat colonoscopy. Referral placed.  - Ambulatory referral to Gastroenterology  4. Essential hypertension Stable. Continue losartan 50mg .  - CBC with Differential/Platelet - Lipid panel - Comprehensive Metabolic Panel (CMET)  5. Urge incontinence Worsening. Many nighttime awakenings for nocturia. Having more urge incontinence and frequency. Will try Oxybutynin as below. F/U if not improving or medication not tolerated.  - oxybutynin (DITROPAN-XL) 10 MG 24 hr tablet; Take 1 tablet (10 mg total) by mouth at bedtime.  Dispense: 90 tablet; Refill: 1  6. OAB (overactive bladder) See above medical treatment plan. - oxybutynin (DITROPAN-XL) 10 MG 24 hr tablet; Take 1 tablet (10 mg total) by mouth at bedtime.  Dispense: 90 tablet; Refill: 1  7. Left hand paresthesia Suspect carpal tunnel vs parasthesia secondary to her recent cervical spine surgery in Nov. 2020. Will check labs as below and f/u pending results. Has f/u with Dr. Mack Guise, orthopedics, tomorrow  for her  shoulders. May mention to him as they have a hand surgeon in office.  - Vitamin D (25 hydroxy) - B12 and Folate Panel  8. Skin lesion of chest wall Noted to have a small area with light brown discoloration and has a scaly, hyperkeratotic appearance.  - Ambulatory referral to Dermatology  9. Skin lesion of face Erythematous patch with scaling. Mildly pruritic. Most likely eczema vs small psoriasis. Trimacinolone given as below. Referral to dermatology for skin evaluation is placed as below.  - Ambulatory referral to Dermatology - triamcinolone cream (KENALOG) 0.1 %; Apply 1 application topically 2 (two) times daily.  Dispense: 30 g; Refill: 0   Return in about 1 year (around 12/19/2020) for CPE.     Reynolds Bowl, PA-C, have reviewed all documentation for this visit. The documentation on 12/22/19 for the exam, diagnosis, procedures, and orders are all accurate and complete.   Rubye Beach  [facility] [phone] (phone) [phone] (fax)  [place]

## 2019-12-20 NOTE — Patient Instructions (Signed)
Oxybutynin extended-release tablets What is this medicine? OXYBUTYNIN (ox i BYOO ti nin) is used to treat overactive bladder. This medicine reduces the amount of bathroom visits. It may also help to control wetting accidents. This medicine may be used for other purposes; ask your health care provider or pharmacist if you have questions. COMMON BRAND NAME(S): Ditropan XL What should I tell my health care provider before I take this medicine? They need to know if you have any of these conditions:  autonomic neuropathy  dementia  difficulty passing urine  glaucoma  intestinal obstruction  kidney disease  liver disease  myasthenia gravis  Parkinson's disease  an unusual or allergic reaction to oxybutynin, other medicines, foods, dyes, or preservatives  pregnant or trying to get pregnant  breast-feeding How should I use this medicine? Take this medicine by mouth with a glass of water. Swallow whole, do not crush, cut, or chew. Follow the directions on the prescription label. You can take this medicine with or without food. Take your doses at regular intervals. Do not take your medicine more often than directed. Talk to your pediatrician regarding the use of this medicine in children. Special care may be needed. While this drug may be prescribed for children as young as 6 years for selected conditions, precautions do apply. Overdosage: If you think you have taken too much of this medicine contact a poison control center or emergency room at once. NOTE: This medicine is only for you. Do not share this medicine with others. What if I miss a dose? If you miss a dose, take it as soon as you can. If it is almost time for your next dose, take only that dose. Do not take double or extra doses. What may interact with this medicine?  antihistamines for allergy, cough and cold  atropine  certain medicines for bladder problems like oxybutynin, tolterodine  certain medicines for  Parkinson's disease like benztropine, trihexyphenidyl  certain medicines for stomach problems like dicyclomine, hyoscyamine  certain medicines for travel sickness like scopolamine  clarithromycin  erythromycin  ipratropium  medicines for fungal infections, like fluconazole, itraconazole, ketoconazole or voriconazole This list may not describe all possible interactions. Give your health care provider a list of all the medicines, herbs, non-prescription drugs, or dietary supplements you use. Also tell them if you smoke, drink alcohol, or use illegal drugs. Some items may interact with your medicine. What should I watch for while using this medicine? It may take a few weeks to notice the full benefit from this medicine. You may need to limit your intake tea, coffee, caffeinated sodas, and alcohol. These drinks may make your symptoms worse. You may get drowsy or dizzy. Do not drive, use machinery, or do anything that needs mental alertness until you know how this medicine affects you. Do not stand or sit up quickly, especially if you are an older patient. This reduces the risk of dizzy or fainting spells. Alcohol may interfere with the effect of this medicine. Avoid alcoholic drinks. Your mouth may get dry. Chewing sugarless gum or sucking hard candy, and drinking plenty of water may help. Contact your doctor if the problem does not go away or is severe. This medicine may cause dry eyes and blurred vision. If you wear contact lenses, you may feel some discomfort. Lubricating drops may help. See your eyecare professional if the problem does not go away or is severe. You may notice the shells of the tablets in your stool from time to time. This  is normal. Avoid extreme heat. This medicine can cause you to sweat less than normal. Your body temperature could increase to dangerous levels, which may lead to heat stroke. What side effects may I notice from receiving this medicine? Side effects that you  should report to your doctor or health care professional as soon as possible:  allergic reactions like skin rash, itching or hives, swelling of the face, lips, or tongue  agitation  breathing problems  confusion  fever  flushing (reddening of the skin)  hallucinations  memory loss  pain or difficulty passing urine  palpitations  unusually weak or tired Side effects that usually do not require medical attention (report to your doctor or health care professional if they continue or are bothersome):  constipation  headache  sexual difficulties (impotence) This list may not describe all possible side effects. Call your doctor for medical advice about side effects. You may report side effects to FDA at 1-800-FDA-1088. Where should I keep my medicine? Keep out of the reach of children. Store at room temperature between 15 and 30 degrees C (59 and 86 degrees F). Protect from moisture and humidity. Throw away any unused medicine after the expiration date. NOTE: This sheet is a summary. It may not cover all possible information. If you have questions about this medicine, talk to your doctor, pharmacist, or health care provider.  2020 Elsevier/Gold Standard (2013-11-04 10:57:06)   Overactive Bladder, Adult  Overactive bladder refers to a condition in which a person has a sudden need to pass urine. The person may leak urine if he or she cannot get to the bathroom fast enough (urinary incontinence). A person with this condition may also wake up several times in the night to go to the bathroom. Overactive bladder is associated with poor nerve signals between your bladder and your brain. Your bladder may get the signal to empty before it is full. You may also have very sensitive muscles that make your bladder squeeze too soon. These symptoms might interfere with daily work or social activities. What are the causes? This condition may be associated with or caused by:  Urinary tract  infection.  Infection of nearby tissues, such as the prostate.  Prostate enlargement.  Surgery on the uterus or urethra.  Bladder stones, inflammation, or tumors.  Drinking too much caffeine or alcohol.  Certain medicines, especially medicines that get rid of extra fluid in the body (diuretics).  Muscle or nerve weakness, especially from: ? A spinal cord injury. ? Stroke. ? Multiple sclerosis. ? Parkinson's disease.  Diabetes.  Constipation. What increases the risk? You may be at greater risk for overactive bladder if you:  Are an older adult.  Smoke.  Are going through menopause.  Have prostate problems.  Have a neurological disease, such as stroke, dementia, Parkinson's disease, or multiple sclerosis (MS).  Eat or drink things that irritate the bladder. These include alcohol, spicy food, and caffeine.  Are overweight or obese. What are the signs or symptoms? Symptoms of this condition include:  Sudden, strong urge to urinate.  Leaking urine.  Urinating 8 or more times a day.  Waking up to urinate 2 or more times a night. How is this diagnosed? Your health care provider may suspect overactive bladder based on your symptoms. He or she will diagnose this condition by:  A physical exam and medical history.  Blood or urine tests. You might need bladder or urine tests to help determine what is causing your overactive bladder. You might  also need to see a health care provider who specializes in urinary tract problems (urologist). How is this treated? Treatment for overactive bladder depends on the cause of your condition and whether it is mild or severe. You can also make lifestyle changes at home. Options include:  Bladder training. This may include: ? Learning to control the urge to urinate by following a schedule that directs you to urinate at regular intervals (timed voiding). ? Doing Kegel exercises to strengthen your pelvic floor muscles, which support  your bladder. Toning these muscles can help you control urination, even if your bladder muscles are overactive.  Special devices. This may include: ? Biofeedback, which uses sensors to help you become aware of your body's signals. ? Electrical stimulation, which uses electrodes placed inside the body (implanted) or outside the body. These electrodes send gentle pulses of electricity to strengthen the nerves or muscles that control the bladder. ? Women may use a plastic device that fits into the vagina and supports the bladder (pessary).  Medicines. ? Antibiotics to treat bladder infection. ? Antispasmodics to stop the bladder from releasing urine at the wrong time. ? Tricyclic antidepressants to relax bladder muscles. ? Injections of botulinum toxin type A directly into the bladder tissue to relax bladder muscles.  Lifestyle changes. This may include: ? Weight loss. Talk to your health care provider about weight loss methods that would work best for you. ? Diet changes. This may include reducing how much alcohol and caffeine you consume, or drinking fluids at different times of the day. ? Not smoking. Do not use any products that contain nicotine or tobacco, such as cigarettes and e-cigarettes. If you need help quitting, ask your health care provider.  Surgery. ? A device may be implanted to help manage the nerve signals that control urination. ? An electrode may be implanted to stimulate electrical signals in the bladder. ? A procedure may be done to change the shape of the bladder. This is done only in very severe cases. Follow these instructions at home: Lifestyle  Make any diet or lifestyle changes that are recommended by your health care provider. These may include: ? Drinking less fluid or drinking fluids at different times of the day. ? Cutting down on caffeine or alcohol. ? Doing Kegel exercises. ? Losing weight if needed. ? Eating a healthy and balanced diet to prevent  constipation. This may include:  Eating foods that are high in fiber, such as fresh fruits and vegetables, whole grains, and beans.  Limiting foods that are high in fat and processed sugars, such as fried and sweet foods. General instructions  Take over-the-counter and prescription medicines only as told by your health care provider.  If you were prescribed an antibiotic medicine, take it as told by your health care provider. Do not stop taking the antibiotic even if you start to feel better.  Use any implants or pessary as told by your health care provider.  If needed, wear pads to absorb urine leakage.  Keep a journal or log to track how much and when you drink and when you feel the need to urinate. This will help your health care provider monitor your condition.  Keep all follow-up visits as told by your health care provider. This is important. Contact a health care provider if:  You have a fever.  Your symptoms do not get better with treatment.  Your pain and discomfort get worse.  You have more frequent urges to urinate. Get help right  away if:  You are not able to control your bladder. Summary  Overactive bladder refers to a condition in which a person has a sudden need to pass urine.  Several conditions may lead to an overactive bladder.  Treatment for overactive bladder depends on the cause and severity of your condition.  Follow your health care provider's instructions about lifestyle changes, doing Kegel exercises, keeping a journal, and taking medicines. This information is not intended to replace advice given to you by your health care provider. Make sure you discuss any questions you have with your health care provider. Document Revised: 12/10/2018 Document Reviewed: 09/04/2017 Elsevier Patient Education  [place].

## 2019-12-21 LAB — COMPREHENSIVE METABOLIC PANEL
ALT: 18 IU/L (ref 0–32)
AST: 24 IU/L (ref 0–40)
Albumin/Globulin Ratio: 1.8 (ref 1.2–2.2)
Albumin: 4.6 g/dL (ref 3.7–4.7)
Alkaline Phosphatase: 43 IU/L (ref 39–117)
BUN/Creatinine Ratio: 14 (ref 12–28)
BUN: 9 mg/dL (ref 8–27)
Bilirubin Total: 0.4 mg/dL (ref 0.0–1.2)
CO2: 24 mmol/L (ref 20–29)
Calcium: 10.2 mg/dL (ref 8.7–10.3)
Chloride: 93 mmol/L — ABNORMAL LOW (ref 96–106)
Creatinine, Ser: 0.63 mg/dL (ref 0.57–1.00)
GFR calc Af Amer: 101 mL/min/{1.73_m2} (ref >59)
GFR calc non Af Amer: 88 mL/min/{1.73_m2} (ref >59)
Globulin, Total: 2.6 g/dL (ref 1.5–4.5)
Glucose: 88 mg/dL (ref 65–99)
Potassium: 4.4 mmol/L (ref 3.5–5.2)
Sodium: 133 mmol/L — ABNORMAL LOW (ref 134–144)
Total Protein: 7.2 g/dL (ref 6.0–8.5)

## 2019-12-21 LAB — CBC WITH DIFFERENTIAL/PLATELET
Basophils Absolute: 0 10*3/uL (ref 0.0–0.2)
Basos: 1 %
EOS (ABSOLUTE): 0.1 10*3/uL (ref 0.0–0.4)
Eos: 1 %
Hematocrit: 38.1 % (ref 34.0–46.6)
Hemoglobin: 13.1 g/dL (ref 11.1–15.9)
Immature Grans (Abs): 0 10*3/uL (ref 0.0–0.1)
Immature Granulocytes: 0 %
Lymphocytes Absolute: 2.9 10*3/uL (ref 0.7–3.1)
Lymphs: 50 %
MCH: 32.7 pg (ref 26.6–33.0)
MCHC: 34.4 g/dL (ref 31.5–35.7)
MCV: 95 fL (ref 79–97)
Monocytes Absolute: 0.5 10*3/uL (ref 0.1–0.9)
Monocytes: 8 %
Neutrophils Absolute: 2.3 10*3/uL (ref 1.4–7.0)
Neutrophils: 40 %
Platelets: 335 10*3/uL (ref 150–450)
RBC: 4.01 x10E6/uL (ref 3.77–5.28)
RDW: 13 % (ref 11.7–15.4)
WBC: 5.8 10*3/uL (ref 3.4–10.8)

## 2019-12-21 LAB — B12 AND FOLATE PANEL
Folate: >20 ng/mL
Vitamin B-12: 703 pg/mL (ref 232–1245)

## 2019-12-21 LAB — LIPID PANEL
Chol/HDL Ratio: 2.5 ratio (ref 0.0–4.4)
Cholesterol, Total: 189 mg/dL (ref 100–199)
HDL: 75 mg/dL
LDL Chol Calc (NIH): 85 mg/dL (ref 0–99)
Triglycerides: 171 mg/dL — ABNORMAL HIGH (ref 0–149)
VLDL Cholesterol Cal: 29 mg/dL (ref 5–40)

## 2019-12-21 LAB — VITAMIN D 25 HYDROXY (VIT D DEFICIENCY, FRACTURES): Vit D, 25-Hydroxy: 38.8 ng/mL (ref 30.0–100.0)

## 2019-12-22 DIAGNOSIS — M75121 Complete rotator cuff tear or rupture of right shoulder, not specified as traumatic: Secondary | ICD-10-CM | POA: Diagnosis not present

## 2019-12-22 DIAGNOSIS — G5602 Carpal tunnel syndrome, left upper limb: Secondary | ICD-10-CM | POA: Diagnosis not present

## 2019-12-23 ENCOUNTER — Telehealth: Payer: Self-pay

## 2019-12-23 NOTE — Telephone Encounter (Signed)
-----   Message from Mar Daring, [place] sent at 12/22/2019 11:25 AM EDT ----- Blood count is normal. Cholesterol is normal. Triglycerides are borderline high, but this can be from not fasting. Sugar is normal. Kidney function and liver enzymes are normal. Sodium is borderline low, so can add some salt back into diet. Potassium and calcium are normal. Vit D is normal. B12 and folate are normal.

## 2019-12-23 NOTE — Telephone Encounter (Signed)
Patient has been advised. KW 

## 2019-12-27 ENCOUNTER — Other Ambulatory Visit: Payer: Self-pay

## 2019-12-27 ENCOUNTER — Encounter: Payer: Self-pay | Admitting: Gastroenterology

## 2019-12-27 ENCOUNTER — Telehealth (INDEPENDENT_AMBULATORY_CARE_PROVIDER_SITE_OTHER): Payer: Self-pay | Admitting: Gastroenterology

## 2019-12-27 DIAGNOSIS — Z86010 Personal history of colonic polyps: Secondary | ICD-10-CM

## 2019-12-27 DIAGNOSIS — Z800 Family history of malignant neoplasm of digestive organs: Secondary | ICD-10-CM

## 2019-12-27 NOTE — Progress Notes (Signed)
Gastroenterology Pre-Procedure Review  Request Date: Tuesday 01/18/20 Requesting Physician: Dr. Allen Norris  PATIENT REVIEW QUESTIONS: The patient responded to the following health history questions as indicated:    1. Are you having any GI issues? no 2. Do you have a personal history of Polyps? yes (2016 with Dr. Allen Norris) 3. Do you have a family history of Colon Cancer or Polyps? yes (mother colon cancer) 4. Diabetes Mellitus? no 5. Joint replacements in the past 12 months?no 6. Major health problems in the past 3 months?yes (neck surgery 07/21/2019) 7. Any artificial heart valves, MVP, or defibrillator?no    MEDICATIONS & ALLERGIES:    Patient reports the following regarding taking any anticoagulation/antiplatelet therapy:   Plavix, Coumadin, Eliquis, Xarelto, Lovenox, Pradaxa, Brilinta, or Effient? no Aspirin? yes (81 mg daily)  Patient confirms/reports the following medications:  Current Outpatient Medications  Medication Sig Dispense Refill  . acetaminophen (TYLENOL) 650 MG CR tablet Take 1,300 mg by mouth every 8 (eight) hours as needed for pain.     Marland Kitchen aspirin 81 MG tablet Take 81 mg by mouth daily.     Marland Kitchen CALCIUM CITRATE PO Take 750 mg by mouth daily.     . Cholecalciferol (VITAMIN D-3 PO) Take 1,000 Units by mouth daily.     Marland Kitchen estradiol (ESTRACE) 0.5 MG tablet TAKE ONE TABLET EVERY DAY 90 tablet 0  . gabapentin (NEURONTIN) 600 MG tablet Take 600-900 mg by mouth 2 (two) times daily. Take 600 mg by mouth in the morning and 900 mg at night    . losartan (COZAAR) 50 MG tablet TAKE ONE TABLET EVERY MORNING 90 tablet 0  . Multiple Vitamin (MULTIVITAMIN) capsule Take 1 capsule by mouth daily.     . Omega 3 1000 MG CAPS Take 1,000-2,000 mg by mouth See admin instructions. Take 1000 mg by mouth in the morning and 2000 mg at night    . oxybutynin (DITROPAN-XL) 10 MG 24 hr tablet Take 1 tablet (10 mg total) by mouth at bedtime. 90 tablet 1  . triamcinolone cream (KENALOG) 0.1 % Apply 1 application  topically 2 (two) times daily. 30 g 0  . vitamin C (ASCORBIC ACID) 500 MG tablet Take 500 mg by mouth daily.     . diphenhydramine-acetaminophen (TYLENOL PM) 25-500 MG TABS tablet Take 2 tablets by mouth at bedtime.    . hydrocortisone 2.5 % cream hydrocortisone 2.5 % topical cream    . meloxicam (MOBIC) 7.5 MG tablet meloxicam 7.5 mg tablet     No current facility-administered medications for this visit.    Patient confirms/reports the following allergies:  Allergies  Allergen Reactions  . Lisinopril Anaphylaxis  . Shrimp [Shellfish Allergy] Anaphylaxis  . Aspercreme [Trolamine Salicylate] Rash  . Lidocaine Rash    Patches broke her skin out    No orders of the defined types were placed in this encounter.   AUTHORIZATION INFORMATION Primary Insurance: 1D#: Group #:  Secondary Insurance: 1D#: Group #:  SCHEDULE INFORMATION: Date: Tuesday 05/18/21Time: Location:[facility]

## 2019-12-30 ENCOUNTER — Encounter: Payer: PPO | Admitting: Physician Assistant

## 2019-12-30 DIAGNOSIS — M6281 Muscle weakness (generalized): Secondary | ICD-10-CM | POA: Diagnosis not present

## 2019-12-30 DIAGNOSIS — M25511 Pain in right shoulder: Secondary | ICD-10-CM | POA: Diagnosis not present

## 2020-01-03 DIAGNOSIS — M6281 Muscle weakness (generalized): Secondary | ICD-10-CM | POA: Diagnosis not present

## 2020-01-03 DIAGNOSIS — M25511 Pain in right shoulder: Secondary | ICD-10-CM | POA: Diagnosis not present

## 2020-01-05 ENCOUNTER — Other Ambulatory Visit: Payer: Self-pay | Admitting: Physician Assistant

## 2020-01-05 DIAGNOSIS — I10 Essential (primary) hypertension: Secondary | ICD-10-CM

## 2020-01-07 DIAGNOSIS — M25511 Pain in right shoulder: Secondary | ICD-10-CM | POA: Diagnosis not present

## 2020-01-07 DIAGNOSIS — M6281 Muscle weakness (generalized): Secondary | ICD-10-CM | POA: Diagnosis not present

## 2020-01-11 DIAGNOSIS — R600 Localized edema: Secondary | ICD-10-CM | POA: Diagnosis not present

## 2020-01-11 DIAGNOSIS — M6281 Muscle weakness (generalized): Secondary | ICD-10-CM | POA: Diagnosis not present

## 2020-01-11 DIAGNOSIS — M25511 Pain in right shoulder: Secondary | ICD-10-CM | POA: Diagnosis not present

## 2020-01-14 DIAGNOSIS — M6281 Muscle weakness (generalized): Secondary | ICD-10-CM | POA: Diagnosis not present

## 2020-01-14 DIAGNOSIS — M25511 Pain in right shoulder: Secondary | ICD-10-CM | POA: Diagnosis not present

## 2020-01-18 DIAGNOSIS — M6281 Muscle weakness (generalized): Secondary | ICD-10-CM | POA: Diagnosis not present

## 2020-01-18 DIAGNOSIS — G5602 Carpal tunnel syndrome, left upper limb: Secondary | ICD-10-CM | POA: Diagnosis not present

## 2020-01-18 DIAGNOSIS — M25511 Pain in right shoulder: Secondary | ICD-10-CM | POA: Diagnosis not present

## 2020-01-21 ENCOUNTER — Other Ambulatory Visit
Admission: RE | Admit: 2020-01-21 | Discharge: 2020-01-21 | Disposition: A | Discharge status: Home / Self Care | Payer: PPO | Source: Ambulatory Visit | Attending: Gastroenterology | Admitting: Gastroenterology

## 2020-01-21 DIAGNOSIS — Z01812 Encounter for preprocedural laboratory examination: Secondary | ICD-10-CM | POA: Diagnosis not present

## 2020-01-21 DIAGNOSIS — Z20822 Contact with and (suspected) exposure to covid-19: Secondary | ICD-10-CM | POA: Insufficient documentation

## 2020-01-22 LAB — SARS CORONAVIRUS 2 (TAT 6-24 HRS): SARS Coronavirus 2: NEGATIVE

## 2020-01-25 ENCOUNTER — Ambulatory Visit: Payer: PPO | Admitting: Anesthesiology

## 2020-01-25 ENCOUNTER — Encounter: Payer: Self-pay | Admitting: Gastroenterology

## 2020-01-25 ENCOUNTER — Encounter
Admission: RE | Disposition: A | Discharge status: Home / Self Care | Payer: Self-pay | Source: Ambulatory Visit | Attending: Gastroenterology

## 2020-01-25 ENCOUNTER — Ambulatory Visit
Admission: RE | Admit: 2020-01-25 | Discharge: 2020-01-25 | Disposition: A | Discharge status: Home / Self Care | Payer: PPO | Source: Ambulatory Visit | Attending: Gastroenterology | Admitting: Gastroenterology

## 2020-01-25 ENCOUNTER — Other Ambulatory Visit: Payer: Self-pay

## 2020-01-25 DIAGNOSIS — Z800 Family history of malignant neoplasm of digestive organs: Secondary | ICD-10-CM | POA: Diagnosis not present

## 2020-01-25 DIAGNOSIS — Z884 Allergy status to anesthetic agent status: Secondary | ICD-10-CM | POA: Diagnosis not present

## 2020-01-25 DIAGNOSIS — Z860100 Personal history of colon polyps, unspecified: Secondary | ICD-10-CM

## 2020-01-25 DIAGNOSIS — M19042 Primary osteoarthritis, left hand: Secondary | ICD-10-CM | POA: Diagnosis not present

## 2020-01-25 DIAGNOSIS — I10 Essential (primary) hypertension: Secondary | ICD-10-CM | POA: Insufficient documentation

## 2020-01-25 DIAGNOSIS — Z1211 Encounter for screening for malignant neoplasm of colon: Secondary | ICD-10-CM | POA: Diagnosis not present

## 2020-01-25 DIAGNOSIS — M19041 Primary osteoarthritis, right hand: Secondary | ICD-10-CM | POA: Diagnosis not present

## 2020-01-25 DIAGNOSIS — Z8249 Family history of ischemic heart disease and other diseases of the circulatory system: Secondary | ICD-10-CM | POA: Insufficient documentation

## 2020-01-25 DIAGNOSIS — K640 First degree hemorrhoids: Secondary | ICD-10-CM | POA: Diagnosis not present

## 2020-01-25 DIAGNOSIS — Z981 Arthrodesis status: Secondary | ICD-10-CM | POA: Diagnosis not present

## 2020-01-25 DIAGNOSIS — Z888 Allergy status to other drugs, medicaments and biological substances status: Secondary | ICD-10-CM | POA: Diagnosis not present

## 2020-01-25 DIAGNOSIS — Z79899 Other long term (current) drug therapy: Secondary | ICD-10-CM | POA: Diagnosis not present

## 2020-01-25 DIAGNOSIS — I8390 Asymptomatic varicose veins of unspecified lower extremity: Secondary | ICD-10-CM | POA: Diagnosis not present

## 2020-01-25 DIAGNOSIS — Z91013 Allergy to seafood: Secondary | ICD-10-CM | POA: Diagnosis not present

## 2020-01-25 DIAGNOSIS — Z820 Family history of epilepsy and other diseases of the nervous system: Secondary | ICD-10-CM | POA: Diagnosis not present

## 2020-01-25 DIAGNOSIS — M810 Age-related osteoporosis without current pathological fracture: Secondary | ICD-10-CM | POA: Insufficient documentation

## 2020-01-25 DIAGNOSIS — Z7982 Long term (current) use of aspirin: Secondary | ICD-10-CM | POA: Insufficient documentation

## 2020-01-25 DIAGNOSIS — Z8049 Family history of malignant neoplasm of other genital organs: Secondary | ICD-10-CM | POA: Insufficient documentation

## 2020-01-25 DIAGNOSIS — Z90710 Acquired absence of both cervix and uterus: Secondary | ICD-10-CM | POA: Insufficient documentation

## 2020-01-25 SURGERY — COLONOSCOPY WITH PROPOFOL
Anesthesia: General

## 2020-01-25 MED ORDER — LIDOCAINE HCL (CARDIAC) PF 100 MG/5ML IV SOSY
PREFILLED_SYRINGE | INTRAVENOUS | Status: DC | PRN
  Administered 2020-01-25: 60 mg via INTRAVENOUS

## 2020-01-25 MED ORDER — PROPOFOL 10 MG/ML IV BOLUS
INTRAVENOUS | Status: DC | PRN
  Administered 2020-01-25: 30 mg via INTRAVENOUS

## 2020-01-25 MED ORDER — PROPOFOL 500 MG/50ML IV EMUL
INTRAVENOUS | Status: DC | PRN
  Administered 2020-01-25: 100 ug/kg/min via INTRAVENOUS

## 2020-01-25 MED ORDER — SODIUM CHLORIDE 0.9 % IV SOLN
INTRAVENOUS | Status: DC
  Administered 2020-01-25: 1000 mL via INTRAVENOUS

## 2020-01-25 MED ORDER — PROPOFOL 10 MG/ML IV BOLUS
INTRAVENOUS | Status: AC
  Filled 2020-01-25: qty 20

## 2020-01-25 NOTE — Op Note (Signed)
[facility] Gastroenterology Patient Name: Sarah Todd Procedure Date: 01/25/2020 9:06 AM MRN: [id number] Account #: [id number] Date of Birth: Jul 03, 1946 Admit Type: Outpatient Age: 74 Room: [facility] ENDO ROOM 4 Gender: Female Note Status: Finalized Procedure:             Colonoscopy Indications:           High risk colon cancer surveillance: Personal history                         of colonic polyps Providers:             Lucilla Lame MD, MD Medicines:             Propofol per Anesthesia Complications:         No immediate complications. Procedure:             Pre-Anesthesia Assessment:                        - Prior to the procedure, a History and Physical was                         performed, and patient medications and allergies were                         reviewed. The patient's tolerance of previous                         anesthesia was also reviewed. The risks and benefits                         of the procedure and the sedation options and risks                         were discussed with the patient. All questions were                         answered, and informed consent was obtained. Prior                         Anticoagulants: The patient has taken no previous                         anticoagulant or antiplatelet agents. ASA Grade                         Assessment: II - A patient with mild systemic disease.                         After reviewing the risks and benefits, the patient                         was deemed in satisfactory condition to undergo the                         procedure.                        After obtaining informed consent, the colonoscope was  passed under direct vision. Throughout the procedure,                         the patient's blood pressure, pulse, and oxygen                         saturations were monitored continuously. The                         Colonoscope was introduced through the  anus and                         advanced to the the cecum, identified by appendiceal                         orifice and ileocecal valve. The colonoscopy was                         performed without difficulty. The patient tolerated                         the procedure well. The quality of the bowel                         preparation was excellent. Findings:      The perianal and digital rectal examinations were normal.      Non-bleeding internal hemorrhoids were found during retroflexion. The       hemorrhoids were Grade I (internal hemorrhoids that do not prolapse). Impression:            - Non-bleeding internal hemorrhoids.                        - No specimens collected. Recommendation:        - Discharge patient to home.                        - Resume previous diet.                        - Continue present medications. Procedure Code(s):     --- Professional ---                        [phone], Colonoscopy, flexible; diagnostic, including                         collection of specimen(s) by brushing or washing, when                         performed (separate procedure) Diagnosis Code(s):     --- Professional ---                        Z86.010, Personal history of colonic polyps CPT copyright 2019 American Medical Association. All rights reserved. The codes documented in this report are preliminary and upon coder review may  be revised to meet current compliance requirements. Lucilla Lame MD, MD 01/25/2020 9:45:59 AM This report has been signed electronically. Number of Addenda: 0 Note Initiated On: 01/25/2020 9:06 AM Scope Withdrawal Time: 0 hours 8 minutes 23 seconds  Total Procedure Duration: 0 hours 22 minutes 43 seconds  Estimated Blood Loss:  Estimated blood loss: none.      [facility]

## 2020-01-25 NOTE — Anesthesia Postprocedure Evaluation (Signed)
Anesthesia Post Note  Patient: Sarah Todd [facility]  Procedure(s) Performed: COLONOSCOPY WITH PROPOFOL (N/A )  Patient location during evaluation: Phase II Anesthesia Type: General and MAC Level of consciousness: awake and oriented Pain management: pain level controlled Vital Signs Assessment: post-procedure vital signs reviewed and stable Respiratory status: spontaneous breathing Cardiovascular status: blood pressure returned to baseline Postop Assessment: no headache Anesthetic complications: no     Last Vitals:  Vitals:   01/25/20 0808  BP: 137/78  Pulse: 94  Resp: 16  Temp: (!) 35.7 C  SpO2: 97%    Last Pain:  Vitals:   01/25/20 0808  TempSrc: Temporal  PainSc: 0-No pain                 Dierdre Forth Kebrina Friend

## 2020-01-25 NOTE — Transfer of Care (Signed)
Immediate Anesthesia Transfer of Care Note  Patient: Sarah Todd [facility]  Procedure(s) Performed: COLONOSCOPY WITH PROPOFOL (N/A )  Patient Location: PACU  Anesthesia Type:MAC and General  Level of Consciousness: awake  Airway & Oxygen Therapy: Patient Spontanous Breathing  Post-op Assessment: Report given to RN  Post vital signs: Reviewed  Last Vitals:  Vitals Value Taken Time  BP 117/82 01/25/20 0949  Temp    Pulse 73 01/25/20 0949  Resp 23 01/25/20 0949  SpO2 97 % 01/25/20 0949  Vitals shown include unvalidated device data.  Last Pain:  Vitals:   01/25/20 0808  TempSrc: Temporal  PainSc: 0-No pain         Complications: No apparent anesthesia complications

## 2020-01-25 NOTE — Anesthesia Postprocedure Evaluation (Signed)
Anesthesia Post Note  Patient: Sarah Todd [facility]  Procedure(s) Performed: COLONOSCOPY WITH PROPOFOL (N/A )  Patient location during evaluation: Endoscopy Anesthesia Type: General Level of consciousness: awake and alert Pain management: pain level controlled Vital Signs Assessment: post-procedure vital signs reviewed and stable Respiratory status: spontaneous breathing, nonlabored ventilation, respiratory function stable and patient connected to nasal cannula oxygen Cardiovascular status: blood pressure returned to baseline and stable Postop Assessment: no apparent nausea or vomiting Anesthetic complications: no     Last Vitals:  Vitals:   01/25/20 1008 01/25/20 1018  BP: 140/84 (!) 160/90  Pulse:    Resp:    Temp:    SpO2:      Last Pain:  Vitals:   01/25/20 0958  TempSrc:   PainSc: 0-No pain                 [name]

## 2020-01-25 NOTE — H&P (Signed)
Lucilla Lame, MD [facility] [address], [place], [place] [address] Phone:[phone] Fax : [phone]  Primary Care Physician:  Mar Daring, PA-C Primary Gastroenterologist:  Dr. Allen Norris  Pre-Procedure History & Physical: HPI:  Sarah Todd is a 74 y.o. female is here for an colonoscopy.   Past Medical History:  Diagnosis Date  . Allergy   . Arthritis    hands  . Family history of adverse reaction to anesthesia    sister but not sure of what happened  . Hypertension   . Osteoporosis   . Varicose veins    Mild    Past Surgical History:  Procedure Laterality Date  . ABDOMINAL HYSTERECTOMY    . ANTERIOR CERVICAL DECOMP/DISCECTOMY FUSION N/A 07/21/2019   Procedure: ANTERIOR CERVICAL DECOMPRESSION/DISCECTOMY FUSION CERVICAL THREE- CERVICAL FOUR, CERVICAL FOUR- CERVICAL FIVE, CERVICAL FIVE- CERVICAL SIX;  Surgeon: Jovita Gamma, MD;  Location: [place];  Service: Neurosurgery;  Laterality: N/A;  ANTERIOR CERVICAL DECOMPRESSION/DISCECTOMY FUSION CERVICAL 3- CERVICAL 4, CERVICAL 4- CERVICAL 5, CERVICAL 5- CERVICAL 6  . BACK SURGERY    . COLONOSCOPY N/A 01/05/2015   Procedure: COLONOSCOPY;  Surgeon: Lucilla Lame, MD;  Location: [place];  Service: Gastroenterology;  Laterality: N/A;  . DILATION AND CURETTAGE OF UTERUS    . TOOTH EXTRACTION    . TUBAL LIGATION      Prior to Admission medications   Medication Sig Start Date End Date Taking? Authorizing Provider  acetaminophen (TYLENOL) 650 MG CR tablet Take 1,300 mg by mouth every 8 (eight) hours as needed for pain.    Yes [provider]  aspirin 81 MG tablet Take 81 mg by mouth daily.    Yes [provider]  CALCIUM CITRATE PO Take 750 mg by mouth daily.    Yes [provider]  Cholecalciferol (VITAMIN D-3 PO) Take 1,000 Units by mouth daily.    Yes [provider]  estradiol (ESTRACE) 0.5 MG tablet TAKE ONE TABLET EVERY DAY 10/09/19  Yes Mar Daring, PA-C    gabapentin (NEURONTIN) 600 MG tablet Take 600-900 mg by mouth 2 (two) times daily. Take 600 mg by mouth in the morning and 900 mg at night   Yes [provider]  hydrocortisone 2.5 % cream hydrocortisone 2.5 % topical cream   Yes [provider]  losartan (COZAAR) 50 MG tablet TAKE ONE TABLET EVERY MORNING 01/05/20  Yes Mar Daring, PA-C  Multiple Vitamin (MULTIVITAMIN) capsule Take 1 capsule by mouth daily.    Yes [provider]  Omega 3 1000 MG CAPS Take 1,000-2,000 mg by mouth See admin instructions. Take 1000 mg by mouth in the morning and 2000 mg at night   Yes [provider]  oxybutynin (DITROPAN-XL) 10 MG 24 hr tablet Take 1 tablet (10 mg total) by mouth at bedtime. 12/20/19  Yes Mar Daring, PA-C  triamcinolone cream (KENALOG) 0.1 % Apply 1 application topically 2 (two) times daily. 12/20/19  Yes Mar Daring, PA-C  vitamin C (ASCORBIC ACID) 500 MG tablet Take 500 mg by mouth daily.    Yes [provider]  diphenhydramine-acetaminophen (TYLENOL PM) 25-500 MG TABS tablet Take 2 tablets by mouth at bedtime.    [provider]  meloxicam (MOBIC) 7.5 MG tablet meloxicam 7.5 mg tablet    [provider]    Allergies as of 12/27/2019 - Review Complete 12/27/2019  Allergen Reaction Noted  . Lisinopril Anaphylaxis 01/03/2015  . Shrimp [shellfish allergy] Anaphylaxis 01/03/2015  .  Aspercreme [trolamine salicylate] Rash 0000000  . Lidocaine Rash 07/19/2019    Family History  Problem Relation Age of Onset  . Cancer Mother        colon cancer  . Alzheimer's disease Father   . Heart disease Father   . Hypertension Father   . Cancer Sister        uterine  . Breast cancer Neg Hx     Social History   Socioeconomic History  . Marital status: Married    Spouse name: Keshanna Janicke  . Number of children: 3  . Years of education: 61  . Highest education level: Some college, no degree   Occupational History  . Occupation: retired  Tobacco Use  . Smoking status: Never Smoker  . Smokeless tobacco: Never Used  Substance and Sexual Activity  . Alcohol use: No  . Drug use: No  . Sexual activity: Yes  Other Topics Concern  . Not on file  Social History Narrative  . Not on file   Social Determinants of Health   Financial Resource Strain:   . Difficulty of Paying Living Expenses:   Food Insecurity:   . Worried About Charity fundraiser in the Last Year:   . Arboriculturist in the Last Year:   Transportation Needs:   . Film/video editor (Medical):   Marland Kitchen Lack of Transportation (Non-Medical):   Physical Activity: Inactive  . Days of Exercise per Week: 0 days  . Minutes of Exercise per Session: 0 min  Stress:   . Feeling of Stress :   Social Connections: Unknown  . Frequency of Communication with Friends and Family: Patient refused  . Frequency of Social Gatherings with Friends and Family: Patient refused  . Attends Religious Services: Patient refused  . Active Member of Clubs or Organizations: Patient refused  . Attends Archivist Meetings: Patient refused  . Marital Status: Patient refused  Intimate Partner Violence: Unknown  . Fear of Current or Ex-Partner: Patient refused  . Emotionally Abused: Patient refused  . Physically Abused: Patient refused  . Sexually Abused: Patient refused    Review of Systems: See HPI, otherwise negative ROS  Physical Exam: BP 137/78   Pulse 94   Temp (!) 96.3 F (35.7 C) (Temporal)   Resp 16   Ht 5\' 2"  (1.575 m)   Wt 63.6 kg   SpO2 97%   BMI 25.66 kg/m  General:   Alert,  pleasant and cooperative in NAD Head:  Normocephalic and atraumatic. Neck:  Supple; no masses or thyromegaly. Lungs:  Clear throughout to auscultation.    Heart:  Regular rate and rhythm. Abdomen:  Soft, nontender and nondistended. Normal bowel sounds, without guarding, and without rebound.   Neurologic:  Alert and  oriented x4;   grossly normal neurologically.  Impression/Plan: Sarah Todd is here for an colonoscopy to be performed for history of adenomatous polyps on 01/05/2015  Risks, benefits, limitations, and alternatives regarding  colonoscopy have been reviewed with the patient.  Questions have been answered.  All parties agreeable.   Lucilla Lame, MD  01/25/2020, 8:30 AM

## 2020-01-25 NOTE — Anesthesia Preprocedure Evaluation (Signed)
Anesthesia Evaluation  Patient identified by MRN, date of birth, ID band Patient awake    Reviewed: Allergy & Precautions, H&P , NPO status , Patient's Chart, lab work & pertinent test results, reviewed documented beta blocker date and time   History of Anesthesia Complications Negative for: history of anesthetic complications  Airway Mallampati: III  TM Distance: >3 FB Neck ROM: full    Dental  (+) Dental Advidsory Given, Caps, Teeth Intact, Missing   Pulmonary neg pulmonary ROS,    Pulmonary exam normal breath sounds clear to auscultation       Cardiovascular Exercise Tolerance: Good hypertension, (-) angina(-) Past MI and (-) Cardiac Stents Normal cardiovascular exam(-) dysrhythmias (-) Valvular Problems/Murmurs Rhythm:regular Rate:Normal     Neuro/Psych negative neurological ROS  negative psych ROS   GI/Hepatic negative GI ROS, Neg liver ROS,   Endo/Other  negative endocrine ROS  Renal/GU negative Renal ROS  negative genitourinary   Musculoskeletal   Abdominal   Peds  Hematology negative hematology ROS (+)   Anesthesia Other Findings Past Medical History: No date: Allergy No date: Arthritis     Comment:  hands No date: Family history of adverse reaction to anesthesia     Comment:  sister but not sure of what happened No date: Hypertension No date: Osteoporosis No date: Varicose veins     Comment:  Mild   Reproductive/Obstetrics negative OB ROS                             Anesthesia Physical Anesthesia Plan  ASA: II  Anesthesia Plan: General   Post-op Pain Management:    Induction: Intravenous  PONV Risk Score and Plan: 3 and Propofol infusion and TIVA  Airway Management Planned: Natural Airway and Nasal Cannula  Additional Equipment:   Intra-op Plan:   Post-operative Plan:   Informed Consent: I have reviewed the patients History and Physical, chart, labs and  discussed the procedure including the risks, benefits and alternatives for the proposed anesthesia with the patient or authorized representative who has indicated his/her understanding and acceptance.     Dental Advisory Given  Plan Discussed with: Anesthesiologist, CRNA and Surgeon  Anesthesia Plan Comments:         Anesthesia Quick Evaluation

## 2020-01-26 ENCOUNTER — Encounter: Payer: Self-pay | Admitting: *Deleted

## 2020-02-11 ENCOUNTER — Other Ambulatory Visit: Payer: Self-pay | Admitting: Physician Assistant

## 2020-02-11 DIAGNOSIS — R202 Paresthesia of skin: Secondary | ICD-10-CM | POA: Diagnosis not present

## 2020-02-11 DIAGNOSIS — N951 Menopausal and female climacteric states: Secondary | ICD-10-CM

## 2020-02-11 NOTE — Telephone Encounter (Signed)
Requested medication (s) are due for refill today - yes   Requested medication (s) are on the active medication list -yes  Future visit scheduled -no  Last refill: 11/12/19  Notes to clinic: Patient was seen for annual exam- Estrogen not addressed at visit- failed protocols MM/BP- sent for review   Requested Prescriptions  Pending Prescriptions Disp Refills   estradiol (ESTRACE) 0.5 MG tablet [Pharmacy Med Name: ESTRADIOL 0.5 MG TAB] 90 tablet 0    Sig: TAKE ONE TABLET EVERY DAY      OB/GYN:  Estrogens Failed - 02/11/2020  3:01 PM      Failed - Mammogram is up-to-date per Health Maintenance      Failed - Last BP in normal range    BP Readings from Last 1 Encounters:  01/25/20 (!) 160/90          Passed - Valid encounter within last 12 months    Recent Outpatient Visits           1 month ago Annual physical exam   [facility] [place], [place]   11 months ago Constipation, unspecified constipation type   [facility], [place], [place]   11 months ago Diarrhea, unspecified type   [facility], [place], [place]   2 years ago Essential hypertension   [place], [place], [place]   2 years ago Trochanteric bursitis of left hip   [place], [place], [place]       Future Appointments             In 1 month Nehemiah Massed Monia Sabal, MD [place]                Requested Prescriptions  Pending Prescriptions Disp Refills   estradiol (ESTRACE) 0.5 MG tablet [Pharmacy Med Name: ESTRADIOL 0.5 MG TAB] 90 tablet 0    Sig: TAKE ONE TABLET EVERY DAY      OB/GYN:  Estrogens Failed - 02/11/2020  3:01 PM      Failed - Mammogram is up-to-date per Health Maintenance      Failed - Last BP in normal range    BP Readings from Last 1 Encounters:  01/25/20 (!) 160/90          Passed - Valid encounter within last 12 months    Recent Outpatient Visits            1 month ago Annual physical exam   [place], [place]   11 months ago Constipation, unspecified constipation type   [place], [place]   11 months ago Diarrhea, unspecified type   [facility], [place], [place]   2 years ago Essential hypertension   [place], [place], [place]   2 years ago Trochanteric bursitis of left hip   [place], [place], [place]       Future Appointments             In 1 month Nehemiah Massed Monia Sabal, MD [place]

## 2020-02-16 ENCOUNTER — Ambulatory Visit
Admission: RE | Admit: 2020-02-16 | Discharge: 2020-02-16 | Disposition: A | Discharge status: Home / Self Care | Payer: PPO | Source: Ambulatory Visit | Attending: Physician Assistant | Admitting: Physician Assistant

## 2020-02-16 DIAGNOSIS — Z1239 Encounter for other screening for malignant neoplasm of breast: Secondary | ICD-10-CM

## 2020-02-16 DIAGNOSIS — Z1231 Encounter for screening mammogram for malignant neoplasm of breast: Secondary | ICD-10-CM | POA: Diagnosis not present

## 2020-02-16 IMAGING — MG DIGITAL SCREENING BILAT W/ TOMO W/ CAD
8 series · 8 of 24 positions shown · non-contrast
Comparison: Previous exam(s).

CLINICAL DATA: Screening.

EXAM:
DIGITAL SCREENING BILATERAL MAMMOGRAM WITH TOMO AND CAD

[R CC synth-2D]
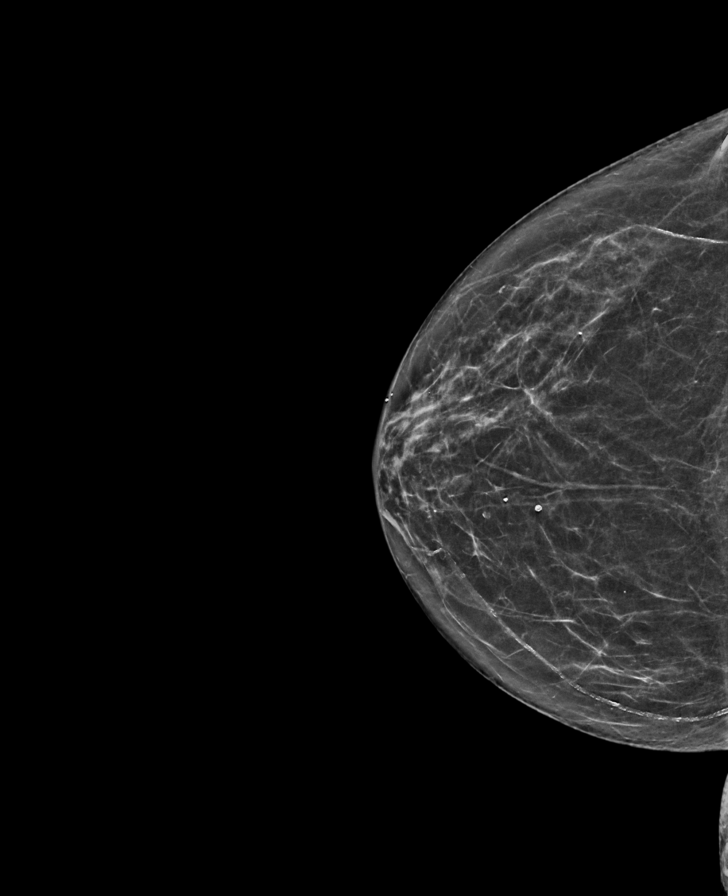

[L MLO synth-2D]
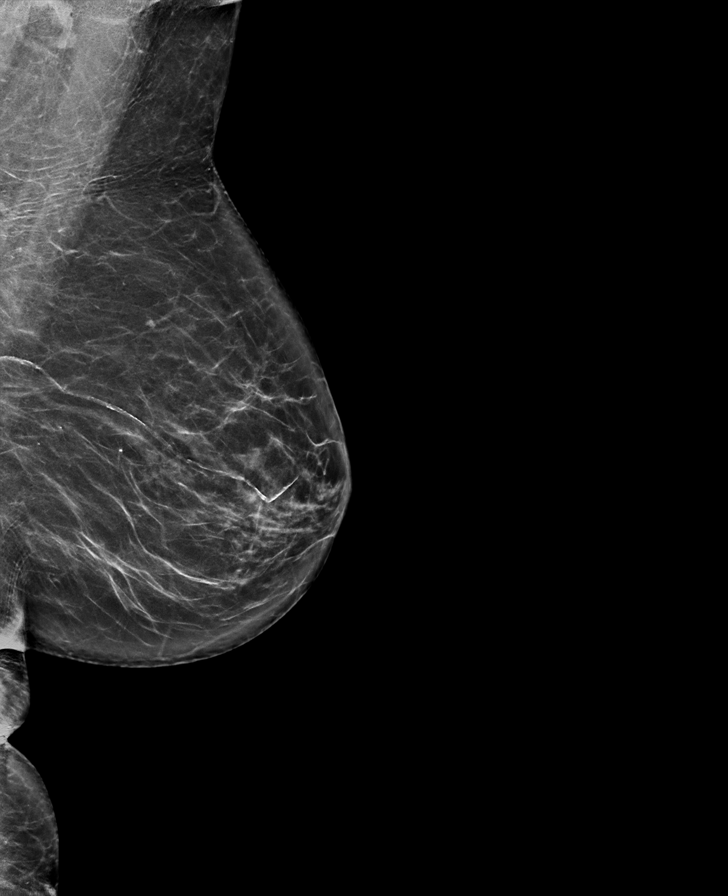

[R MLO synth-2D]
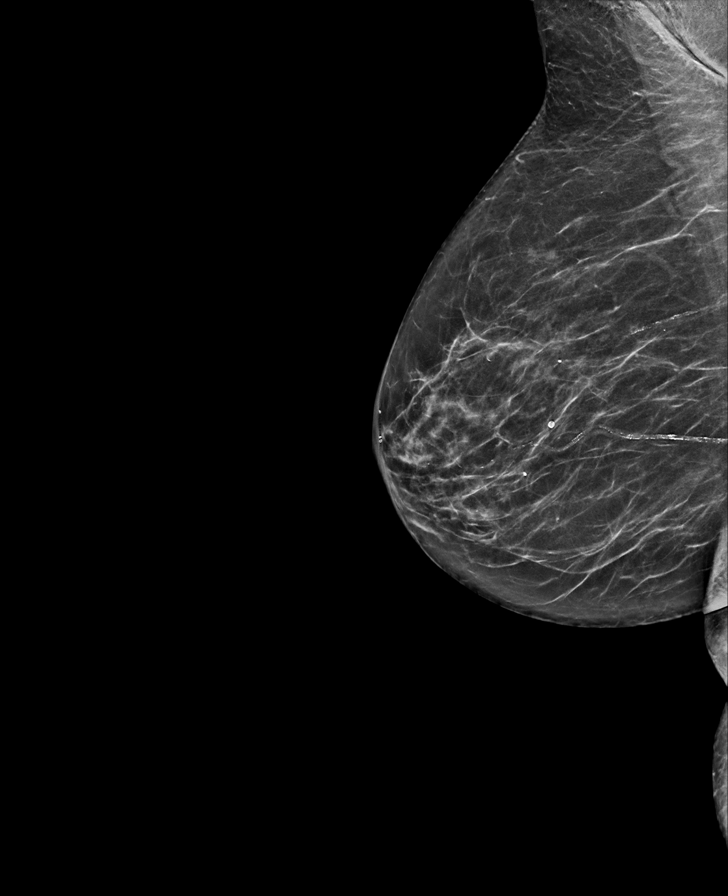

[L CC synth-2D]
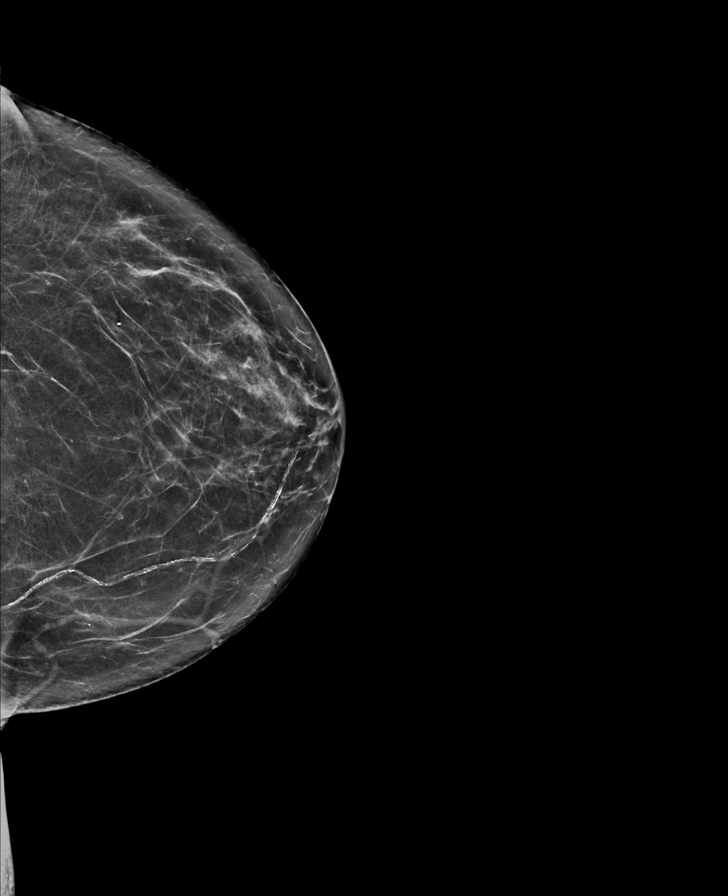

[R CC tomo · tomo slice 29/58.0]
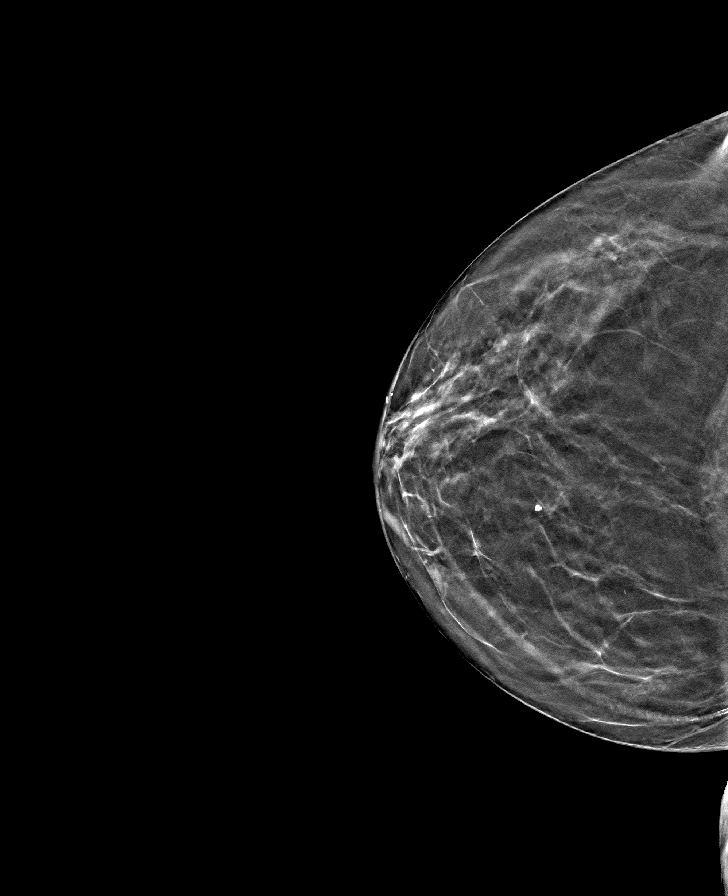

[R MLO tomo · tomo slice 33/66.0]
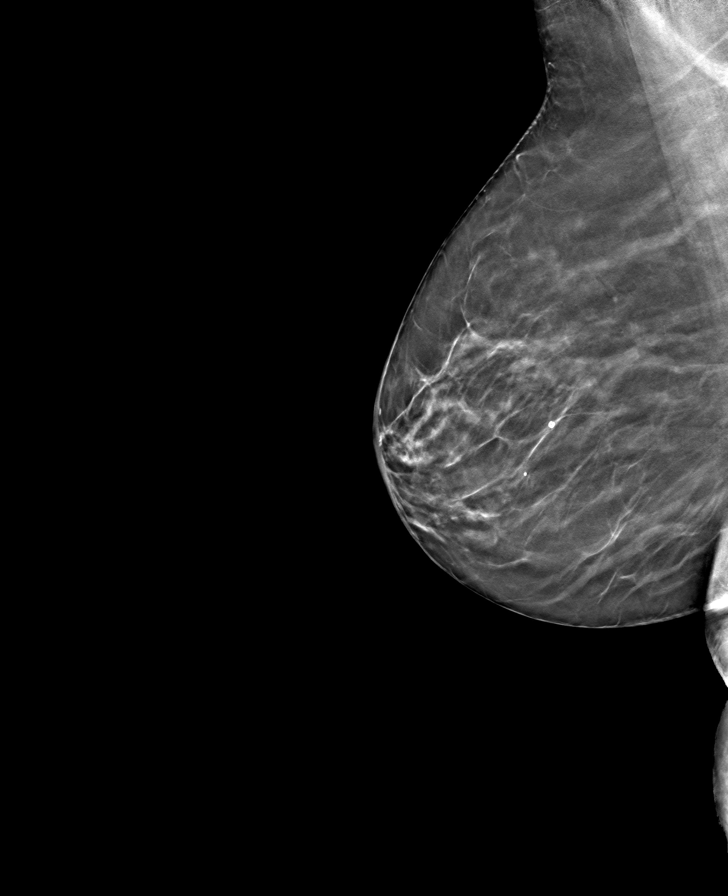

[L CC tomo · tomo slice 33/65.0]
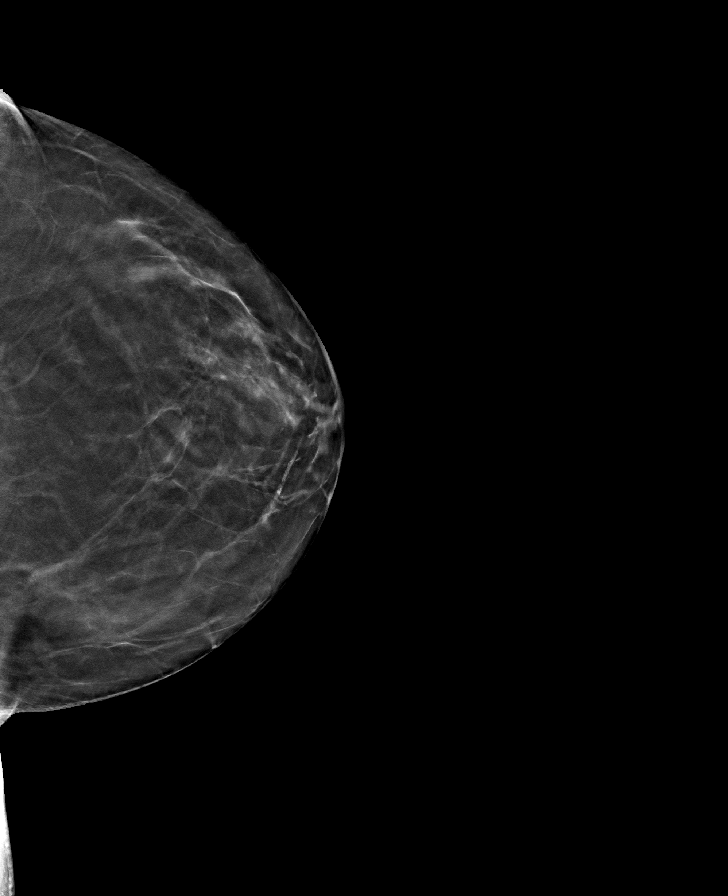

[L MLO tomo · tomo slice 36/71.0]
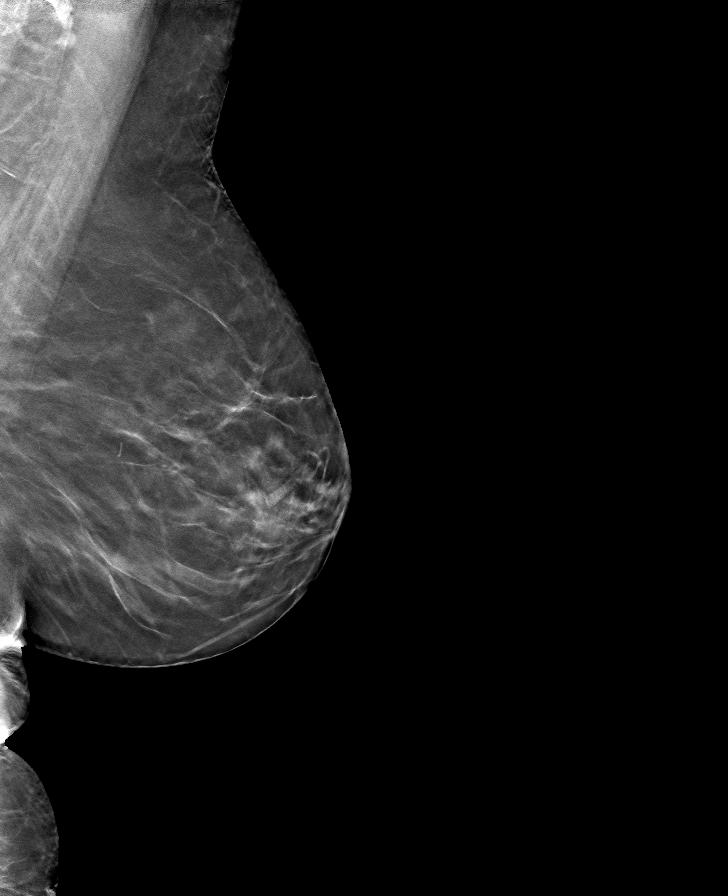

[8 of 24 positions shown; findings below may reference images not displayed]

ACR Breast Density Category b: There are scattered areas of
fibroglandular density.
FINDINGS: There are no findings suspicious for malignancy. Images were
processed with CAD.
IMPRESSION: No mammographic evidence of malignancy. A result letter of this
screening mammogram will be mailed directly to the patient.

RECOMMENDATION:
Screening mammogram in one year. (Code:[TQ])

BI-RADS CATEGORY  1: Negative.

## 2020-02-17 ENCOUNTER — Other Ambulatory Visit: Payer: Self-pay | Admitting: Physician Assistant

## 2020-02-17 ENCOUNTER — Telehealth: Payer: Self-pay

## 2020-02-17 MED ORDER — HYDROCORTISONE 2.5 % EX CREA: TOPICAL_CREAM | Freq: Two times a day (BID) | CUTANEOUS | 5 refills | Status: DC

## 2020-02-17 NOTE — Telephone Encounter (Signed)
LMTCB-if patient calls back ok for [facility] nurse to ask patient what kind of questions if she having regarding the Colonoscopy? What is she needing?

## 2020-02-17 NOTE — Telephone Encounter (Signed)
Pt need a refill  hydrocortisone 2.5 % cream [[id number]]  [facility] - [place], [place]  [place] [address]  Phone: [phone] Fax: [phone]

## 2020-02-17 NOTE — Telephone Encounter (Signed)
Patient advised as directed below. 

## 2020-02-17 NOTE — Telephone Encounter (Signed)
Copied from [place] [phone]. Topic: General - Other >> Feb 17, 2020  2:17 PM Hinda Lenis D wrote: Reason for CRM: PT has question about "colonoscopy" / please advise

## 2020-02-17 NOTE — Addendum Note (Signed)
Addended by: Marijo Conception on: 02/17/2020 02:34 PM   Modules accepted: Orders

## 2020-02-17 NOTE — Telephone Encounter (Signed)
-----   Message from Mar Daring, [place] sent at 02/17/2020  2:43 PM EDT ----- Normal mammogram. Repeat screening in one year.

## 2020-02-18 NOTE — Telephone Encounter (Signed)
Patient states she recently had colonoscopy and was told by provider that this was her last one- that she had aged out. This statement made her feel like she had been "kicked to the curb" and she wants to know what happens after this. Reassured patient that there are non invasive screening test that can be done that are not as risky such as Colocare kits. She is fine with that and reassured- she just felt as if that is it for her and if she developed a problem- it would not be caught in time.  No need to call her back if this information is satisfactory- she is fine with the response.

## 2020-02-18 NOTE — Telephone Encounter (Signed)
error 

## 2020-02-20 ENCOUNTER — Other Ambulatory Visit: Payer: Self-pay | Admitting: Orthopedic Surgery

## 2020-02-28 NOTE — Progress Notes (Signed)
Subjective:   Sarah Todd is a 74 y.o. female who presents for Medicare Annual (Subsequent) preventive examination.  Review of Systems    N/A  Cardiac Risk Factors include: advanced age (>71men, >68 women);hypertension     Objective:    Today's Vitals   02/29/20 1321 02/29/20 1324  BP: (!) 142/72   Pulse: 72   Temp: 97.9 F (36.6 C)   TempSrc: Temporal   SpO2: 97%   Weight: 147 lb 6.4 oz (66.9 kg)   Height: 5\' 2"  (1.575 m)   PainSc: 0-No pain 0-No pain   Body mass index is 26.96 kg/m.  Advanced Directives 02/29/2020 01/25/2020 07/19/2019 04/21/2019 02/23/2019 12/18/2017 12/13/2016  Does Patient Have a Medical Advance Directive? Yes Yes Yes No Yes Yes Yes  Type of Paramedic of [place];Living will Living will Living will - [place];Living will Living will -  Does patient want to make changes to medical advance directive? - - No - Patient declined - - - -  Copy of [place] in Chart? Yes - validated most recent copy scanned in chart (See row information) - - - Yes - validated most recent copy scanned in chart (See row information) - -  Would patient like information on creating a medical advance directive? - - No - Patient declined - - - -    Current Medications (verified) Outpatient Encounter Medications as of 02/29/2020  Medication Sig  . acetaminophen (TYLENOL) 650 MG CR tablet Take 1,300 mg by mouth every 8 (eight) hours as needed for pain.   Marland Kitchen aspirin 81 MG tablet Take 81 mg by mouth daily.   Marland Kitchen CALCIUM CITRATE PO Take 750 mg by mouth daily.   . Cholecalciferol (VITAMIN D-3 PO) Take 1,000 Units by mouth daily.   . diphenhydramine-acetaminophen (TYLENOL PM) 25-500 MG TABS tablet Take 2 tablets by mouth at bedtime as needed.   Marland Kitchen estradiol (ESTRACE) 0.5 MG tablet TAKE ONE TABLET EVERY DAY  . gabapentin (NEURONTIN) 600 MG tablet Take 600-900 mg by mouth 2 (two) times daily. Take 600 mg by mouth in the  morning and 900 mg at night  . hydrocortisone 2.5 % cream Apply topically 2 (two) times daily. (Patient taking differently: Apply topically 2 (two) times daily. As needed)  . losartan (COZAAR) 50 MG tablet TAKE ONE TABLET EVERY MORNING  . Multiple Vitamin (MULTIVITAMIN) capsule Take 1 capsule by mouth daily.   . Omega 3 1000 MG CAPS Take 1,000-2,000 mg by mouth See admin instructions. Take 1000 mg by mouth in the morning and 2000 mg at night  . OVER THE COUNTER MEDICATION Fiber OTC 1 tablet daily. (unsure dose)  . triamcinolone cream (KENALOG) 0.1 % Apply 1 application topically 2 (two) times daily. (Patient taking differently: Apply 1 application topically 2 (two) times daily. As needed)  . vitamin C (ASCORBIC ACID) 500 MG tablet Take 500 mg by mouth daily.   . meloxicam (MOBIC) 7.5 MG tablet meloxicam 7.5 mg tablet (Patient not taking: Reported on 02/29/2020)  . oxybutynin (DITROPAN-XL) 10 MG 24 hr tablet Take 1 tablet (10 mg total) by mouth at bedtime. (Patient not taking: Reported on 02/29/2020)   No facility-administered encounter medications on file as of 02/29/2020.    Allergies (verified) Lisinopril, Shrimp [shellfish allergy], Aspercreme [trolamine salicylate], and Lidocaine   History: Past Medical History:  Diagnosis Date  . Allergy   . Arthritis    hands  . Family history of adverse reaction to  anesthesia    sister but not sure of what happened  . Hypertension   . Osteoporosis   . Varicose veins    Mild   Past Surgical History:  Procedure Laterality Date  . ABDOMINAL HYSTERECTOMY    . ANTERIOR CERVICAL DECOMP/DISCECTOMY FUSION N/A 07/21/2019   Procedure: ANTERIOR CERVICAL DECOMPRESSION/DISCECTOMY FUSION CERVICAL THREE- CERVICAL FOUR, CERVICAL FOUR- CERVICAL FIVE, CERVICAL FIVE- CERVICAL SIX;  Surgeon: Jovita Gamma, MD;  Location: [facility];  Service: Neurosurgery;  Laterality: N/A;  ANTERIOR CERVICAL DECOMPRESSION/DISCECTOMY FUSION CERVICAL 3- CERVICAL 4, CERVICAL 4-  CERVICAL 5, CERVICAL 5- CERVICAL 6  . BACK SURGERY    . COLONOSCOPY N/A 01/05/2015   Procedure: COLONOSCOPY;  Surgeon: Lucilla Lame, MD;  Location: [facility];  Service: Gastroenterology;  Laterality: N/A;  . COLONOSCOPY WITH PROPOFOL N/A 01/25/2020   Procedure: COLONOSCOPY WITH PROPOFOL;  Surgeon: Lucilla Lame, MD;  Location[facility] ENDOSCOPY;  Service: Endoscopy;  Laterality: N/A;  . DILATION AND CURETTAGE OF UTERUS    . TOOTH EXTRACTION    . TUBAL LIGATION     Family History  Problem Relation Age of Onset  . Cancer Mother        colon cancer  . Alzheimer's disease Father   . Heart disease Father   . Hypertension Father   . Cancer Sister        uterine  . Breast cancer Neg Hx    Social History   Socioeconomic History  . Marital status: Married    Spouse name: Hina Gupta  . Number of children: 3  . Years of education: 74  . Highest education level: Some college, no degree  Occupational History  . Occupation: retired  Tobacco Use  . Smoking status: Never Smoker  . Smokeless tobacco: Never Used  Vaping Use  . Vaping Use: Never used  Substance and Sexual Activity  . Alcohol use: No  . Drug use: No  . Sexual activity: Yes  Other Topics Concern  . Not on file  Social History Narrative  . Not on file   Social Determinants of Health   Financial Resource Strain: Low Risk   . Difficulty of Paying Living Expenses: Not hard at all  Food Insecurity: No Food Insecurity  . Worried About Charity fundraiser in the Last Year: Never true  . Ran Out of Food in the Last Year: Never true  Transportation Needs: No Transportation Needs  . Lack of Transportation (Medical): No  . Lack of Transportation (Non-Medical): No  Physical Activity: Inactive  . Days of Exercise per Week: 0 days  . Minutes of Exercise per Session: 0 min  Stress: No Stress Concern Present  . Feeling of Stress : Not at all  Social Connections: Moderately Integrated  . Frequency of Communication  with Friends and Family: More than three times a week  . Frequency of Social Gatherings with Friends and Family: More than three times a week  . Attends Religious Services: More than 4 times per year  . Active Member of Clubs or Organizations: No  . Attends Archivist Meetings: Never  . Marital Status: Married    Tobacco Counseling Counseling given: Not Answered   Clinical Intake:  Pre-visit preparation completed: Yes  Pain : No/denies pain (Has chronic back pain and rotator cuff pain.) Pain Score: 0-No pain     Nutritional Status: BMI 25 -29 Overweight Nutritional Risks: None Diabetes: No  How often do you need to have someone help you when you read  instructions, pamphlets, or other written materials from your doctor or pharmacy?: 1 - Never  Diabetic? No  Interpreter Needed?: No  Information entered by :: [facility] LPN   Activities of Daily Living In your present state of health, do you have any difficulty performing the following activities: 02/29/2020 12/20/2019  Hearing? N N  Vision? N Y  Comment Wears eye glasses. -  Difficulty concentrating or making decisions? N N  Walking or climbing stairs? N N  Dressing or bathing? N N  Doing errands, shopping? N N  Preparing Food and eating ? N -  Using the Toilet? N -  In the past six months, have you accidently leaked urine? N -  Do you have problems with loss of bowel control? N -  Managing your Medications? N -  Managing your Finances? N -  Housekeeping or managing your Housekeeping? N -  Some recent data might be hidden    Patient Care Team: Mar Daring, PA-C as PCP - General (Family Medicine) Arelia Sneddon, [place] as Consulting Physician (Optometry) Beverly Gust, MD (Otolaryngology) Kary Kos, MD as Consulting Physician (Neurosurgery) Lucilla Lame, MD as Consulting Physician (Gastroenterology) Thornton Park, MD as Referring Physician (Orthopedic Surgery)  Indicate any recent  Medical Services you may have received from other than [facility] providers in the past year (date may be approximate).     Assessment:   This is a routine wellness examination for Sarah Todd.  Hearing/Vision screen No exam data present  Dietary issues and exercise activities discussed: Current Exercise Habits: The patient does not participate in regular exercise at present, Exercise limited by: orthopedic condition(s)  Goals    . DIET - INCREASE WATER INTAKE     Recommend increasing water intake to 6 glasses a day.     . Exercise 3x per week (30 min per time)     Recommend to exercise for 3 days a week for at least 30 minutes at a time.       Depression Screen PHQ 2/9 Scores 02/29/2020 12/20/2019 02/23/2019 02/23/2019 12/18/2017 12/18/2017 12/13/2016  PHQ - 2 Score 0 0 0 0 0 0 0  PHQ- 9 Score - - 0 - 4 - 2    Fall Risk Fall Risk  02/29/2020 12/20/2019 02/23/2019 12/18/2017 12/13/2016  Falls in the past year? 0 0 0 No No  Number falls in past yr: 0 0 - - -  Injury with Fall? 0 0 - - -  Follow up - Falls evaluation completed - - -    Any stairs in or around the home? Yes  If so, are there any without handrails? No  Home free of loose throw rugs in walkways, pet beds, electrical cords, etc? Yes  Adequate lighting in your home to reduce risk of falls? Yes   ASSISTIVE DEVICES UTILIZED TO PREVENT FALLS:  Life alert? No  Use of a cane, walker or w/c? No  Grab bars in the bathroom? No  Shower chair or bench in shower? No  Elevated toilet seat or a handicapped toilet? No   TIMED UP AND GO:  Was the test performed? Yes .  Length of time to ambulate 10 feet: 8 sec.   Gait steady and fast without use of assistive device  Cognitive Function:     6CIT Screen 02/29/2020 12/20/2019 02/23/2019 12/18/2017  What Year? 0 points 0 points 0 points 0 points  What month? 0 points 0 points 0 points 0 points  What time? 0 points 0 points 0 points  0 points  Count back from 20 0 points 0 points 0 points 0  points  Months in reverse 0 points 0 points 0 points 0 points  Repeat phrase 0 points 6 points 0 points 0 points  Total Score 0 6 0 0    Immunizations Immunization History  Administered Date(s) Administered  . Influenza,inj,Quad PF,6+ Mos 06/15/2015  . Influenza-Unspecified 05/13/2017, 06/09/2018, 05/18/2019  . Moderna SARS-COVID-2 Vaccination 10/30/2019, 11/27/2019  . Pneumococcal Conjugate-13 11/17/2014  . Pneumococcal Polysaccharide-23 08/03/2013  . Tdap 10/16/2016  . Zoster Recombinat (Shingrix) 09/09/2018, 03/09/2019    TDAP status: Up to date Flu Vaccine status: Up to date Pneumococcal vaccine status: Up to date Covid-19 vaccine status: Completed vaccines  Qualifies for Shingles Vaccine? Yes   Zostavax completed No   Shingrix Completed?: Yes  Screening Tests Health Maintenance  Topic Date Due  . INFLUENZA VACCINE  04/02/2020  . COLONOSCOPY  01/24/2025  . TETANUS/TDAP  10/16/2026  . DEXA SCAN  Completed  . COVID-19 Vaccine  Completed  . Hepatitis C Screening  Completed  . PNA vac Low Risk Adult  Completed    Health Maintenance  There are no preventive care reminders to display for this patient.  Colorectal cancer screening: Completed 01/25/20. Repeat every 5 years Mammogram status: No longer required.  Bone Density status: Completed 10/05/13. Results reflect: Bone density results: NORMAL. No repeat needed unless advised by a physician.  Lung Cancer Screening: (Low Dose CT Chest recommended if Age 10-80 years, 30 pack-year currently smoking OR have quit w/in 15years.) does not qualify.   Additional Screening:  Hepatitis C Screening: Up to date  Vision Screening: Recommended annual ophthalmology exams for early detection of glaucoma and other disorders of the eye. Is the patient up to date with their annual eye exam?  Yes  Who is the provider or what is the name of the office in which the patient attends annual eye exams? Dr Annamaria Helling @ [employer] If pt is not  established with a provider, would they like to be referred to a provider to establish care? No .   Dental Screening: Recommended annual dental exams for proper oral hygiene  Community Resource Referral / Chronic Care Management: CRR required this visit?  No   CCM required this visit?  No      Plan:     I have personally reviewed and noted the following in the patient's chart:   . Medical and social history . Use of alcohol, tobacco or illicit drugs  . Current medications and supplements . Functional ability and status . Nutritional status . Physical activity . Advanced directives . List of other physicians . Hospitalizations, surgeries, and ER visits in previous 12 months . Vitals . Screenings to include cognitive, depression, and falls . Referrals and appointments  In addition, I have reviewed and discussed with patient certain preventive protocols, quality metrics, and best practice recommendations. A written personalized care plan for preventive services as well as general preventive health recommendations were provided to patient.     Mikayela Deats [place], [place]   4/48/1856   Nurse Notes: None.

## 2020-02-29 ENCOUNTER — Other Ambulatory Visit: Payer: Self-pay

## 2020-02-29 ENCOUNTER — Ambulatory Visit (INDEPENDENT_AMBULATORY_CARE_PROVIDER_SITE_OTHER): Payer: PPO

## 2020-02-29 VITALS — BP 142/72 | HR 72 | Temp 97.9°F | Ht 62.0 in | Wt 147.4 lb

## 2020-02-29 DIAGNOSIS — Z0000 Encounter for general adult medical examination without abnormal findings: Secondary | ICD-10-CM

## 2020-02-29 NOTE — Patient Instructions (Signed)
Sarah Todd , Thank you for taking time to come for your Medicare Wellness Visit. I appreciate your ongoing commitment to your health goals. Please review the following plan we discussed and let me know if I can assist you in the future.   Screening recommendations/referrals: Colonoscopy: Up to date, due 12/2024 Mammogram: No longer required.  Bone Density: Previous DEXA scan was normal. No repeat needed unless advised by a physician. Recommended yearly ophthalmology/optometry visit for glaucoma screening and checkup Recommended yearly dental visit for hygiene and checkup  Vaccinations: Influenza vaccine: Done 05/18/19 Pneumococcal vaccine: Completed series Tdap vaccine: Up to date, due 10/2026 Shingles vaccine: Completed series    Advanced directives: Currently on file  Conditions/risks identified: Recommend to exercise for 3 days a week for at least 30 minutes at a time. Continue to increase water intake to 6-8 8 oz glasses a day.   Next appointment: None. Declined scheduling a follow up with PCP or an AWV for 2022 at this time.    Preventive Care 27 Years and Older, Female Preventive care refers to lifestyle choices and visits with your health care provider that can promote health and wellness. What does preventive care include?  A yearly physical exam. This is also called an annual well check.  Dental exams once or twice a year.  Routine eye exams. Ask your health care provider how often you should have your eyes checked.  Personal lifestyle choices, including:  Daily care of your teeth and gums.  Regular physical activity.  Eating a healthy diet.  Avoiding tobacco and drug use.  Limiting alcohol use.  Practicing safe sex.  Taking low-dose aspirin every day.  Taking vitamin and mineral supplements as recommended by your health care provider. What happens during an annual well check? The services and screenings done by your health care provider during your annual  well check will depend on your age, overall health, lifestyle risk factors, and family history of disease. Counseling  Your health care provider may ask you questions about your:  Alcohol use.  Tobacco use.  Drug use.  Emotional well-being.  Home and relationship well-being.  Sexual activity.  Eating habits.  History of falls.  Memory and ability to understand (cognition).  Work and work Statistician.  Reproductive health. Screening  You may have the following tests or measurements:  Height, weight, and BMI.  Blood pressure.  Lipid and cholesterol levels. These may be checked every 5 years, or more frequently if you are over 64 years old.  Skin check.  Lung cancer screening. You may have this screening every year starting at age 5 if you have a 30-pack-year history of smoking and currently smoke or have quit within the past 15 years.  Fecal occult blood test (FOBT) of the stool. You may have this test every year starting at age 25.  Flexible sigmoidoscopy or colonoscopy. You may have a sigmoidoscopy every 5 years or a colonoscopy every 10 years starting at age 37.  Hepatitis C blood test.  Hepatitis B blood test.  Sexually transmitted disease (STD) testing.  Diabetes screening. This is done by checking your blood sugar (glucose) after you have not eaten for a while (fasting). You may have this done every 1-3 years.  Bone density scan. This is done to screen for osteoporosis. You may have this done starting at age 58.  Mammogram. This may be done every 1-2 years. Talk to your health care provider about how often you should have regular mammograms. Talk with your  health care provider about your test results, treatment options, and if necessary, the need for more tests. Vaccines  Your health care provider may recommend certain vaccines, such as:  Influenza vaccine. This is recommended every year.  Tetanus, diphtheria, and acellular pertussis (Tdap, Td) vaccine.  You may need a Td booster every 10 years.  Zoster vaccine. You may need this after age 26.  Pneumococcal 13-valent conjugate (PCV13) vaccine. One dose is recommended after age 63.  Pneumococcal polysaccharide (PPSV23) vaccine. One dose is recommended after age 81. Talk to your health care provider about which screenings and vaccines you need and how often you need them. This information is not intended to replace advice given to you by your health care provider. Make sure you discuss any questions you have with your health care provider. Document Released: 09/15/2015 Document Revised: 05/08/2016 Document Reviewed: 06/20/2015 Elsevier Interactive Patient Education  2017 [place] Prevention in the Home Falls can cause injuries. They can happen to people of all ages. There are many things you can do to make your home safe and to help prevent falls. What can I do on the outside of my home?  Regularly fix the edges of walkways and driveways and fix any cracks.  Remove anything that might make you trip as you walk through a door, such as a raised step or threshold.  Trim any bushes or trees on the path to your home.  Use bright outdoor lighting.  Clear any walking paths of anything that might make someone trip, such as rocks or tools.  Regularly check to see if handrails are loose or broken. Make sure that both sides of any steps have handrails.  Any raised decks and porches should have guardrails on the edges.  Have any leaves, snow, or ice cleared regularly.  Use sand or salt on walking paths during winter.  Clean up any spills in your garage right away. This includes oil or grease spills. What can I do in the bathroom?  Use night lights.  Install grab bars by the toilet and in the tub and shower. Do not use towel bars as grab bars.  Use non-skid mats or decals in the tub or shower.  If you need to sit down in the shower, use a plastic, non-slip stool.  Keep the  floor dry. Clean up any water that spills on the floor as soon as it happens.  Remove soap buildup in the tub or shower regularly.  Attach bath mats securely with double-sided non-slip rug tape.  Do not have throw rugs and other things on the floor that can make you trip. What can I do in the bedroom?  Use night lights.  Make sure that you have a light by your bed that is easy to reach.  Do not use any sheets or blankets that are too big for your bed. They should not hang down onto the floor.  Have a firm chair that has side arms. You can use this for support while you get dressed.  Do not have throw rugs and other things on the floor that can make you trip. What can I do in the kitchen?  Clean up any spills right away.  Avoid walking on wet floors.  Keep items that you use a lot in easy-to-reach places.  If you need to reach something above you, use a strong step stool that has a grab bar.  Keep electrical cords out of the way.  Do not use  floor polish or wax that makes floors slippery. If you must use wax, use non-skid floor wax.  Do not have throw rugs and other things on the floor that can make you trip. What can I do with my stairs?  Do not leave any items on the stairs.  Make sure that there are handrails on both sides of the stairs and use them. Fix handrails that are broken or loose. Make sure that handrails are as long as the stairways.  Check any carpeting to make sure that it is firmly attached to the stairs. Fix any carpet that is loose or worn.  Avoid having throw rugs at the top or bottom of the stairs. If you do have throw rugs, attach them to the floor with carpet tape.  Make sure that you have a light switch at the top of the stairs and the bottom of the stairs. If you do not have them, ask someone to add them for you. What else can I do to help prevent falls?  Wear shoes that:  Do not have high heels.  Have rubber bottoms.  Are comfortable and fit  you well.  Are closed at the toe. Do not wear sandals.  If you use a stepladder:  Make sure that it is fully opened. Do not climb a closed stepladder.  Make sure that both sides of the stepladder are locked into place.  Ask someone to hold it for you, if possible.  Clearly mark and make sure that you can see:  Any grab bars or handrails.  First and last steps.  Where the edge of each step is.  Use tools that help you move around (mobility aids) if they are needed. These include:  Canes.  Walkers.  Scooters.  Crutches.  Turn on the lights when you go into a dark area. Replace any light bulbs as soon as they burn out.  Set up your furniture so you have a clear path. Avoid moving your furniture around.  If any of your floors are uneven, fix them.  If there are any pets around you, be aware of where they are.  Review your medicines with your doctor. Some medicines can make you feel dizzy. This can increase your chance of falling. Ask your doctor what other things that you can do to help prevent falls. This information is not intended to replace advice given to you by your health care provider. Make sure you discuss any questions you have with your health care provider. Document Released: 06/15/2009 Document Revised: 01/25/2016 Document Reviewed: 09/23/2014 Elsevier Interactive Patient Education  2017 [employer].

## 2020-03-13 ENCOUNTER — Other Ambulatory Visit: Payer: Self-pay

## 2020-03-13 ENCOUNTER — Ambulatory Visit: Payer: PPO | Admitting: Dermatology

## 2020-03-13 DIAGNOSIS — L820 Inflamed seborrheic keratosis: Secondary | ICD-10-CM

## 2020-03-13 DIAGNOSIS — L821 Other seborrheic keratosis: Secondary | ICD-10-CM | POA: Diagnosis not present

## 2020-03-13 NOTE — Progress Notes (Signed)
   New Patient Visit  Subjective  Sarah Todd is a 74 y.o. female who presents for the following: Skin Problem (growth on her neck x 2 years, growing, changing color ).  The following portions of the chart were reviewed this encounter and updated as appropriate:  Tobacco  Allergies  Meds  Problems  Med Hx  Surg Hx  Fam Hx     Review of Systems:  No other skin or systemic complaints except as noted in HPI or Assessment and Plan.  Objective  Well appearing patient in no apparent distress; mood and affect are within normal limits.  A focused examination was performed including face, neck . Relevant physical exam findings are noted in the Assessment and Plan.  Objective  Left anterior base of neck: Erythematous keratotic or waxy stuck-on papule or plaque.   Objective  chest: Stuck-on, waxy, tan-brown papule or plaque --Discussed benign etiology and prognosis.    Assessment & Plan    Inflamed seborrheic keratosis Left anterior base of neck  Destruction of lesion - Left anterior base of neck Complexity: simple   Destruction method: cryotherapy   Informed consent: discussed and consent obtained   Timeout:  patient name, date of birth, surgical site, and procedure verified Lesion destroyed using liquid nitrogen: Yes   Region frozen until ice ball extended beyond lesion: Yes   Outcome: patient tolerated procedure well with no complications   Post-procedure details: wound care instructions given    Seborrheic keratosis chest  Observe   Return in about 3 months (around 06/13/2020) for ISK .  I, Marye Round, CMA, am acting as scribe for Sarina Ser, MD .  Documentation: I have reviewed the above documentation for accuracy and completeness, and I agree with the above.  Sarina Ser, MD

## 2020-03-13 NOTE — Patient Instructions (Signed)
Seborrheic Keratosis  What causes seborrheic keratoses? Seborrheic keratoses are harmless, common skin growths that first appear during adult life.  As time goes by, more growths appear.  Some people may develop a large number of them.  Seborrheic keratoses appear on both covered and uncovered body parts.  They are not caused by sunlight.  The tendency to develop seborrheic keratoses can be inherited.  They vary in color from skin-colored to gray, brown, or even black.  They can be either smooth or have a rough, warty surface.   Seborrheic keratoses are superficial and look as if they were stuck on the skin.  Under the microscope this type of keratosis looks like layers upon layers of skin.  That is why at times the top layer may seem to fall off, but the rest of the growth remains and re-grows.    Treatment Seborrheic keratoses do not need to be treated, but can easily be removed in the office.  Seborrheic keratoses often cause symptoms when they rub on clothing or jewelry.  Lesions can be in the way of shaving.  If they become inflamed, they can cause itching, soreness, or burning.  Removal of a seborrheic keratosis can be accomplished by freezing, burning, or surgery. If any spot bleeds, scabs, or grows rapidly, please return to have it checked, as these can be an indication of a skin cancer.   Cryotherapy Aftercare  Wash gently with soap and water everyday.   Apply Vaseline and Band-Aid daily until healed.  

## 2020-03-14 ENCOUNTER — Encounter: Payer: Self-pay | Admitting: Dermatology

## 2020-03-16 DIAGNOSIS — E519 Thiamine deficiency, unspecified: Secondary | ICD-10-CM | POA: Diagnosis not present

## 2020-03-16 DIAGNOSIS — E531 Pyridoxine deficiency: Secondary | ICD-10-CM | POA: Diagnosis not present

## 2020-03-16 DIAGNOSIS — G8929 Other chronic pain: Secondary | ICD-10-CM | POA: Diagnosis not present

## 2020-03-16 DIAGNOSIS — Z131 Encounter for screening for diabetes mellitus: Secondary | ICD-10-CM | POA: Diagnosis not present

## 2020-03-16 DIAGNOSIS — M79602 Pain in left arm: Secondary | ICD-10-CM | POA: Diagnosis not present

## 2020-03-16 DIAGNOSIS — R200 Anesthesia of skin: Secondary | ICD-10-CM | POA: Diagnosis not present

## 2020-03-16 DIAGNOSIS — M5030 Other cervical disc degeneration, unspecified cervical region: Secondary | ICD-10-CM | POA: Diagnosis not present

## 2020-03-16 DIAGNOSIS — M25512 Pain in left shoulder: Secondary | ICD-10-CM | POA: Diagnosis not present

## 2020-03-16 DIAGNOSIS — M542 Cervicalgia: Secondary | ICD-10-CM | POA: Diagnosis not present

## 2020-03-16 DIAGNOSIS — R202 Paresthesia of skin: Secondary | ICD-10-CM | POA: Diagnosis not present

## 2020-03-17 ENCOUNTER — Other Ambulatory Visit: Payer: Self-pay | Admitting: Neurology

## 2020-03-17 DIAGNOSIS — M542 Cervicalgia: Secondary | ICD-10-CM

## 2020-03-17 DIAGNOSIS — M25512 Pain in left shoulder: Secondary | ICD-10-CM

## 2020-03-17 DIAGNOSIS — M79602 Pain in left arm: Secondary | ICD-10-CM

## 2020-03-22 ENCOUNTER — Telehealth: Payer: Self-pay

## 2020-03-22 NOTE — Telephone Encounter (Signed)
Copied from [place] [id number]. Topic: General - Inquiry >> Mar 22, 2020 11:23 AM Mathis Bud wrote: Reason for CRM: Patient has a question regarding a medication she was prescribed 12/22/2019.  The medication is oxybutynin chloride [id number], I did not find medication on patients medication list.  Patient is wondering if she needs to be taking this medication or not. Call back [phone]

## 2020-03-23 NOTE — Telephone Encounter (Signed)
She could restart if she desires to try a little longer.  We could always change therapy and see if she tolerates another medicine better.

## 2020-03-23 NOTE — Telephone Encounter (Signed)
Pharmacy called her and let her know she was due for refills.Per patient she took it for 2 weeks then stopped taking it because it wasn't working. This medicine was discontinued on 03/15/20 by patient's preference. She reports that now that she put more thought into it she feels that maybe she needed to take it longer than 2 weeks for it to work. She is asking if she should give it a try again.

## 2020-03-23 NOTE — Telephone Encounter (Signed)
Patient advised as directed below. She is going to give the Oxybutynin a try

## 2020-03-24 ENCOUNTER — Other Ambulatory Visit: Payer: Self-pay | Admitting: Physician Assistant

## 2020-03-24 DIAGNOSIS — N3281 Overactive bladder: Secondary | ICD-10-CM

## 2020-03-24 DIAGNOSIS — N3941 Urge incontinence: Secondary | ICD-10-CM

## 2020-03-28 ENCOUNTER — Other Ambulatory Visit: Admission: RE | Admit: 2020-03-28 | Payer: PPO | Source: Ambulatory Visit

## 2020-03-31 ENCOUNTER — Other Ambulatory Visit: Payer: Self-pay | Admitting: Neurosurgery

## 2020-03-31 DIAGNOSIS — Z981 Arthrodesis status: Secondary | ICD-10-CM

## 2020-03-31 DIAGNOSIS — M47812 Spondylosis without myelopathy or radiculopathy, cervical region: Secondary | ICD-10-CM | POA: Diagnosis not present

## 2020-03-31 DIAGNOSIS — M5412 Radiculopathy, cervical region: Secondary | ICD-10-CM | POA: Diagnosis not present

## 2020-04-04 ENCOUNTER — Other Ambulatory Visit: Admission: RE | Admit: 2020-04-04 | Payer: PPO | Source: Ambulatory Visit

## 2020-04-04 ENCOUNTER — Ambulatory Visit
Admission: RE | Admit: 2020-04-04 | Discharge: 2020-04-04 | Disposition: A | Discharge status: Home / Self Care | Payer: PPO | Source: Ambulatory Visit | Attending: Neurology | Admitting: Neurology

## 2020-04-04 ENCOUNTER — Other Ambulatory Visit: Payer: Self-pay

## 2020-04-04 DIAGNOSIS — M542 Cervicalgia: Secondary | ICD-10-CM

## 2020-04-04 DIAGNOSIS — M5021 Other cervical disc displacement,  high cervical region: Secondary | ICD-10-CM | POA: Diagnosis not present

## 2020-04-04 DIAGNOSIS — M4802 Spinal stenosis, cervical region: Secondary | ICD-10-CM | POA: Diagnosis not present

## 2020-04-04 DIAGNOSIS — M79602 Pain in left arm: Secondary | ICD-10-CM | POA: Diagnosis not present

## 2020-04-04 DIAGNOSIS — M50223 Other cervical disc displacement at C6-C7 level: Secondary | ICD-10-CM | POA: Diagnosis not present

## 2020-04-04 DIAGNOSIS — M25512 Pain in left shoulder: Secondary | ICD-10-CM

## 2020-04-04 DIAGNOSIS — G8929 Other chronic pain: Secondary | ICD-10-CM

## 2020-04-04 DIAGNOSIS — M4319 Spondylolisthesis, multiple sites in spine: Secondary | ICD-10-CM | POA: Diagnosis not present

## 2020-04-04 IMAGING — MR MR CERVICAL SPINE WO/W CM
5 of 8 series · 29 of 48 positions shown · IV contrast (6ml Gadavist)
Comparison: [DATE] MRI cervical spine. [DATE] cervical
spine radiographs.

CLINICAL DATA: Pain

EXAM:
MRI CERVICAL SPINE WITHOUT AND WITH CONTRAST
TECHNIQUE: Multiplanar and multiecho pulse sequences of the cervical spine, to
include the craniocervical junction and cervicothoracic junction,
were obtained without and with intravenous contrast.
CONTRAST:  6mL GADAVIST GADOBUTROL 1 MMOL/ML IV SOLN

[Series 5: T2 · sagittal · 3.0mm · 0.62mm/px · 4 of 15 slices shown (1 of 2)]
[im 1/15]
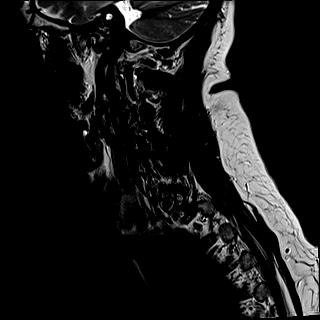
[im 5/15]
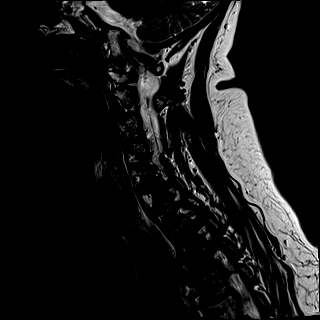
[im 10/15]
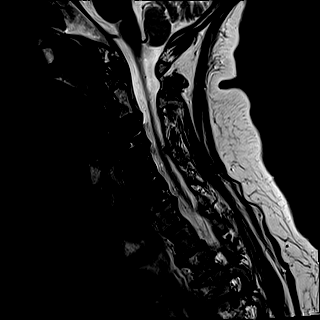
[im 15/15]
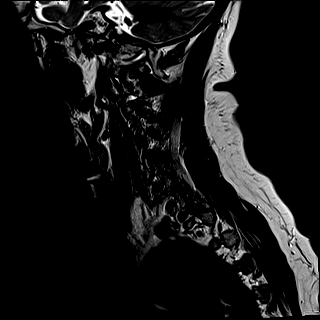

[Series 7: STIR · sagittal · 3.0mm · 0.62mm/px · 4 of 15 slices shown]
[im 1/15]
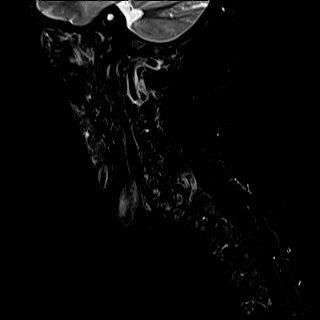
[im 5/15]
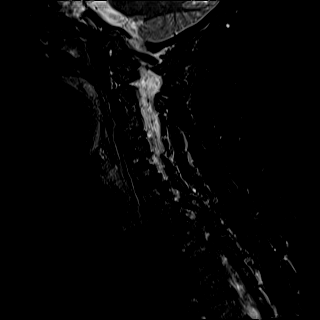
[im 10/15]
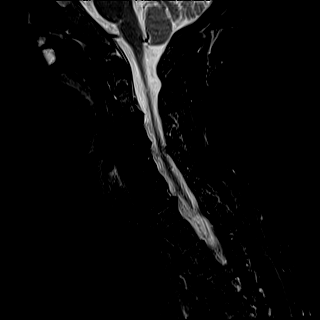
[im 15/15]
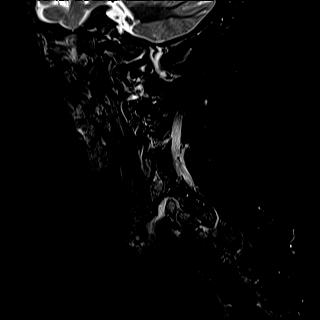

[Series 8: T2 · axial · 3.0mm · 0.70mm/px · z∈[-114,-20]mm · 8 of 29 slices shown (2 of 2)]
[im 1/29]
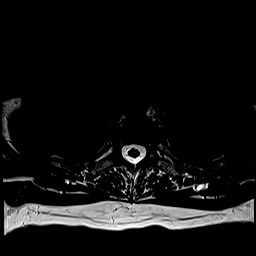
[im 5/29]
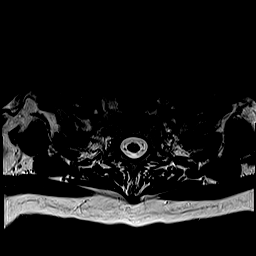
[im 9/29]
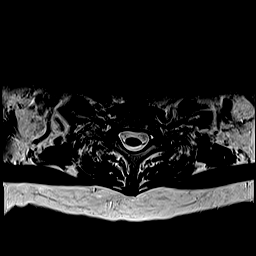
[im 13/29]
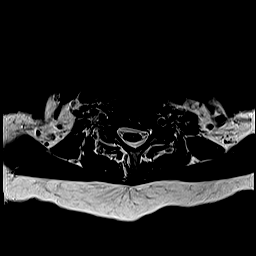
[im 17/29]
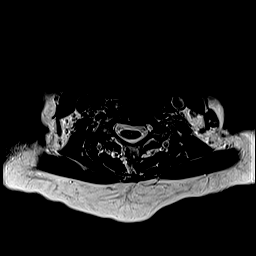
[im 21/29]
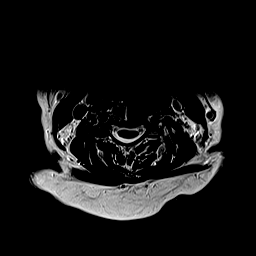
[im 25/29]
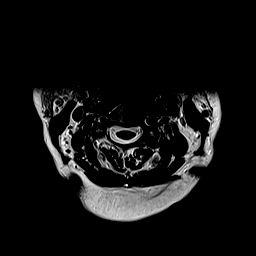
[im 29/29]
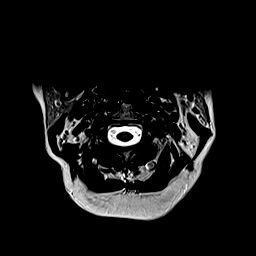

[Series 10: T1 · axial · non-contrast · 3.0mm · 0.35mm/px · z∈[-114,-20]mm · 8 of 29 slices shown]
[im 1/29]
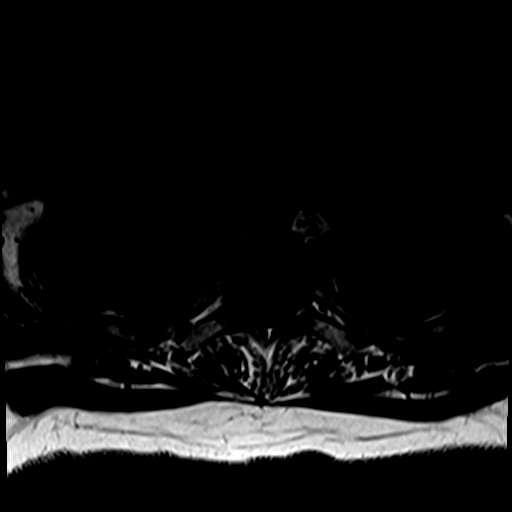
[im 5/29]
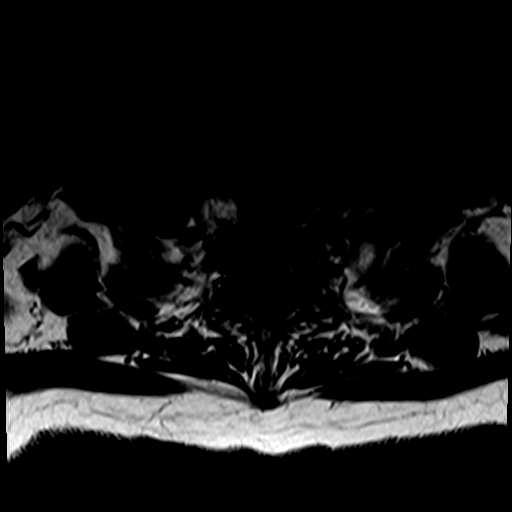
[im 9/29]
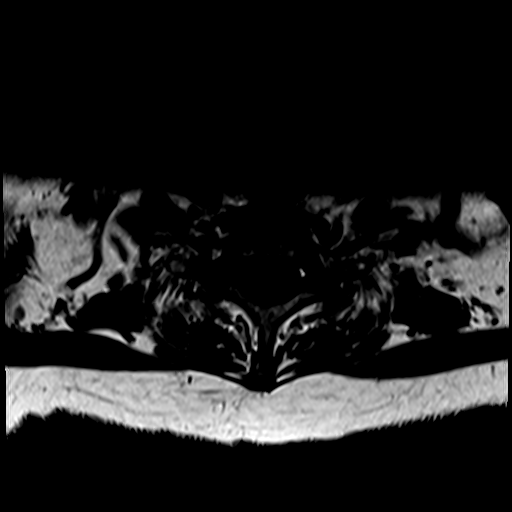
[im 13/29]
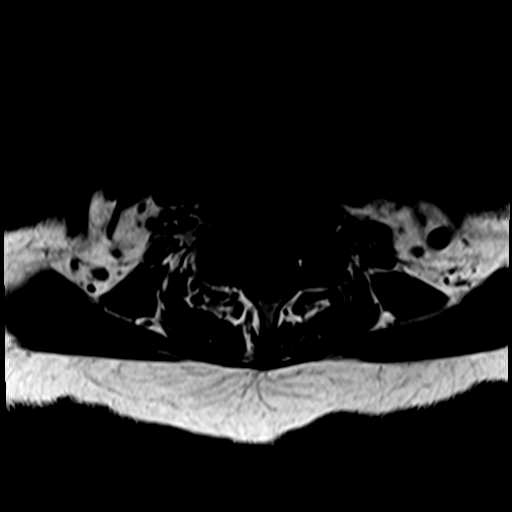
[im 17/29]
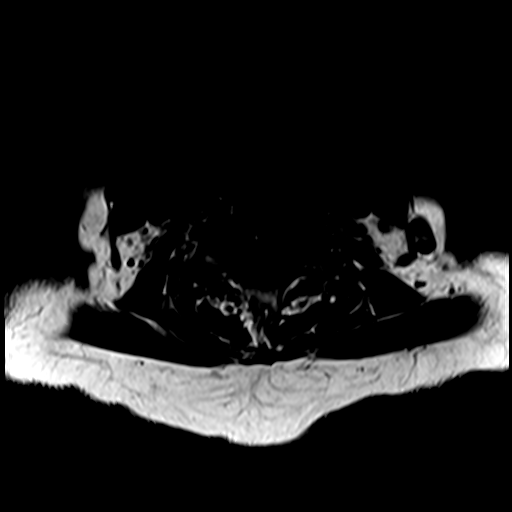
[im 21/29]
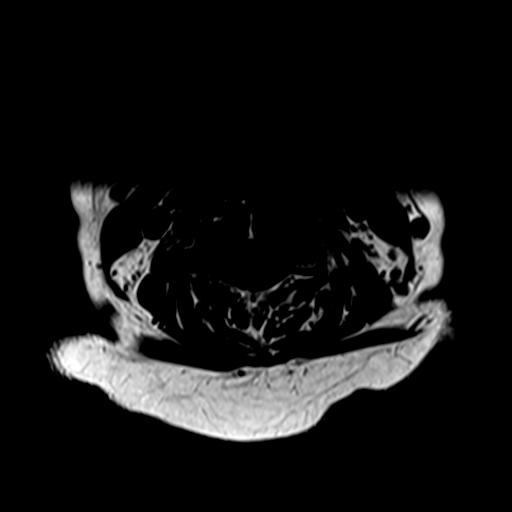
[im 25/29]
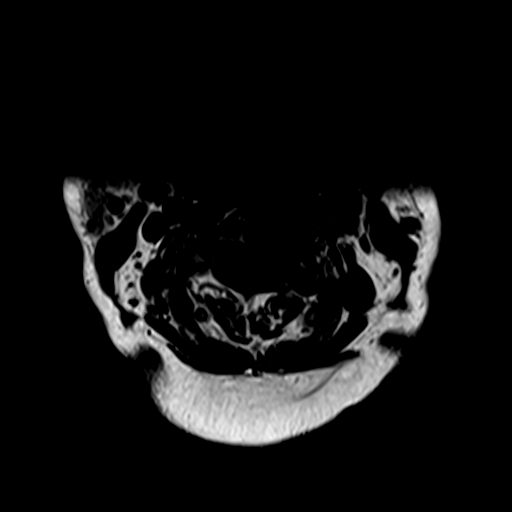
[im 29/29]
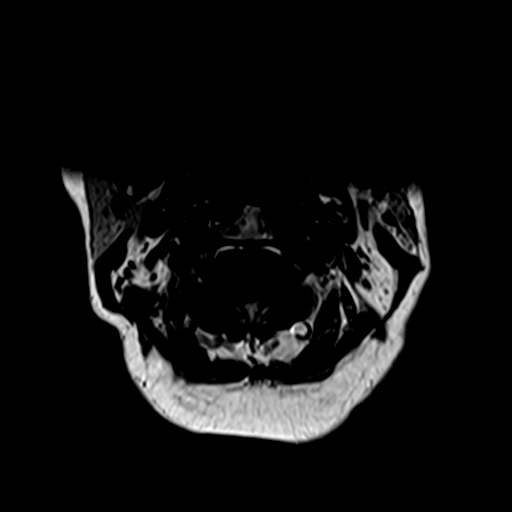

[Series 12: T1 post-contrast · axial · 3.0mm · 0.35mm/px · z∈[-114,-61]mm · 5 of 29 slices shown]
[im 1/29]
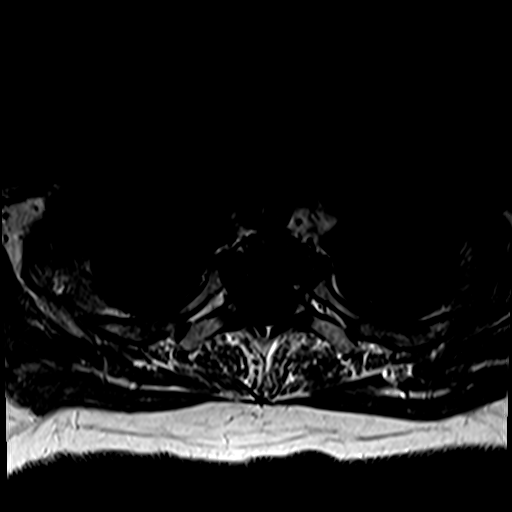
[im 5/29]
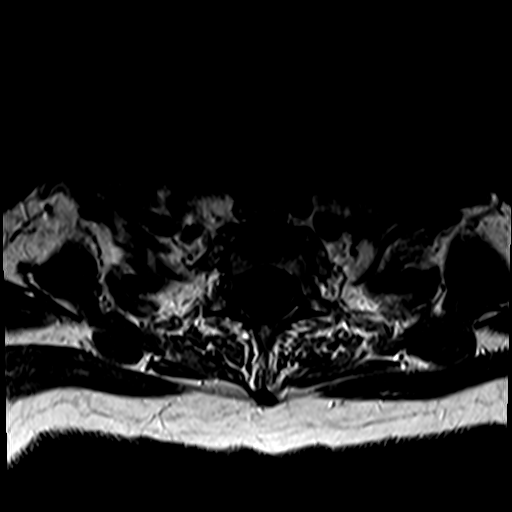
[im 9/29]
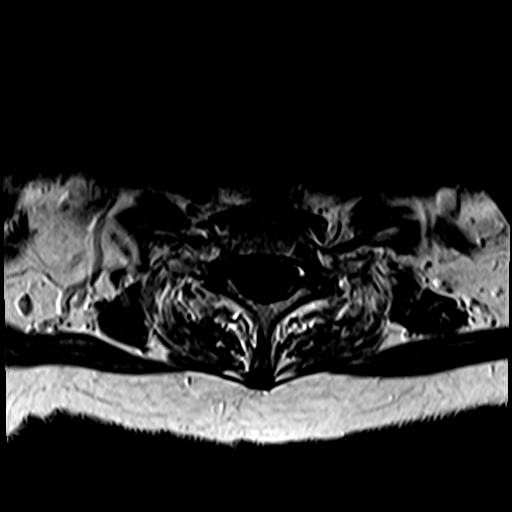
[im 13/29]
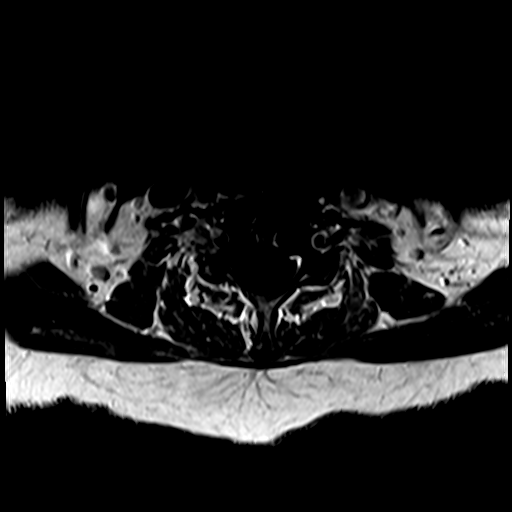
[im 17/29]
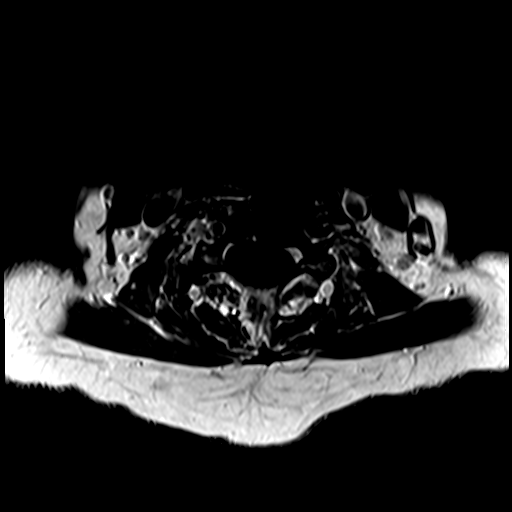

[29 of 48 positions shown; findings below may reference images not displayed]

FINDINGS: Alignment: Straightening of cervical lordosis. Minimal grade 1 C7-T1
anterolisthesis.

Vertebrae: Normal bone marrow signal intensity. No focal osseous
lesion. Sequela of C3-6 ACDF. Associated susceptibility artifact
limits evaluation.

Cord: Normal signal and morphology.

Posterior Fossa, vertebral arteries: Negative.

Disc levels: Multilevel spondylosis and disc space loss.

C2-3: Disc osteophyte complex with superimposed right paracentral
protrusion and left predominant uncovertebral/facet hypertrophy.
Patent spinal canal and right neural foramen. Moderate left neural
foraminal narrowing.

C3-4: Sequela of fusion. Spinal canal and neural foramina are
patent.

C4-5: Sequela of fusion. Spinal canal and neural foramina are
patent.

C5-6: Sequela of fusion. Dorsal osseous prominence with small left
subarticular protrusion, bilateral uncovertebral and facet
hypertrophy. Patent spinal canal. Mild right greater than left
neural foraminal narrowing.

C6-7: Disc osteophyte complex with superimposed central and left
foraminal ([DATE]) protrusions, bilateral uncovertebral and facet
hypertrophy. Mild spinal canal and bilateral neural foraminal
narrowing.

C7-T1: Minimal grade 1 anterolisthesis. No significant disc bulge.
Patent spinal canal and bilateral neural foramina.

Paraspinal tissues: Within normal limits.  No abnormal enhancement.
IMPRESSION: Sequela of C3-6 ACDF. Mild right greater than left C5-6 neural
foraminal narrowing.

Moderate left C2-3 neural foraminal narrowing.

Mild C6-7 spinal canal and bilateral neural foraminal narrowing.

## 2020-04-04 MED ORDER — GADOBUTROL 1 MMOL/ML IV SOLN
6.0000 mL | Freq: Once | INTRAVENOUS | Status: AC | PRN
  Administered 2020-04-04: 6 mL via INTRAVENOUS

## 2020-04-06 ENCOUNTER — Other Ambulatory Visit: Payer: Self-pay | Admitting: Physician Assistant

## 2020-04-06 ENCOUNTER — Ambulatory Visit: Admit: 2020-04-06 | Payer: PPO | Admitting: Orthopedic Surgery

## 2020-04-06 DIAGNOSIS — I10 Essential (primary) hypertension: Secondary | ICD-10-CM

## 2020-04-06 SURGERY — SHOULDER ARTHROSCOPY WITH OPEN ROTATOR CUFF REPAIR AND DISTAL CLAVICLE ACROMINECTOMY
Anesthesia: General | Site: Shoulder | Laterality: Right

## 2020-04-07 ENCOUNTER — Ambulatory Visit
Admission: RE | Admit: 2020-04-07 | Discharge: 2020-04-07 | Disposition: A | Discharge status: Home / Self Care | Payer: PPO | Source: Ambulatory Visit | Attending: Neurosurgery | Admitting: Neurosurgery

## 2020-04-07 ENCOUNTER — Other Ambulatory Visit: Payer: Self-pay

## 2020-04-07 DIAGNOSIS — I700 Atherosclerosis of aorta: Secondary | ICD-10-CM | POA: Diagnosis not present

## 2020-04-07 DIAGNOSIS — M4802 Spinal stenosis, cervical region: Secondary | ICD-10-CM | POA: Diagnosis not present

## 2020-04-07 DIAGNOSIS — Z981 Arthrodesis status: Secondary | ICD-10-CM | POA: Diagnosis not present

## 2020-04-07 DIAGNOSIS — M4319 Spondylolisthesis, multiple sites in spine: Secondary | ICD-10-CM | POA: Diagnosis not present

## 2020-04-07 DIAGNOSIS — M47812 Spondylosis without myelopathy or radiculopathy, cervical region: Secondary | ICD-10-CM | POA: Diagnosis not present

## 2020-04-07 IMAGING — CT CT CERVICAL SPINE W/O CM
2 of 3 series · 11 of 28 positions shown, 14 images · non-contrast
Comparison: [DATE] MRI cervical spine and prior. [DATE]
cervical spine radiographs and prior.

CLINICAL DATA: Tingling and pain.

EXAM:
CT CERVICAL SPINE WITHOUT CONTRAST
TECHNIQUE: Multidetector CT imaging of the cervical spine was performed without
intravenous contrast. Multiplanar CT image reconstructions were also
generated.

[Series 6: sagittal bone · sagittal · 0.22mm/px · 5 of 50 slices shown, 6 images]
[im 17/50  bone]
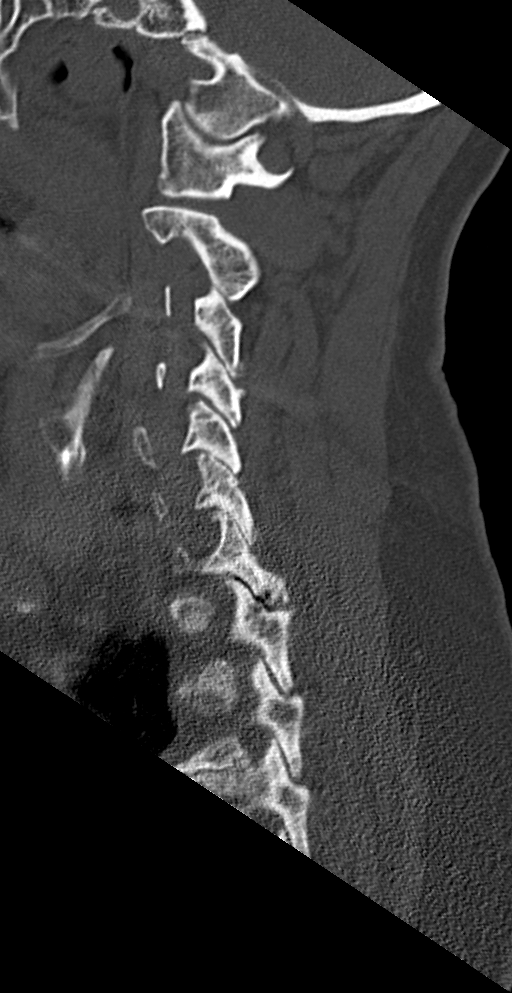
[im 21/50  bone]
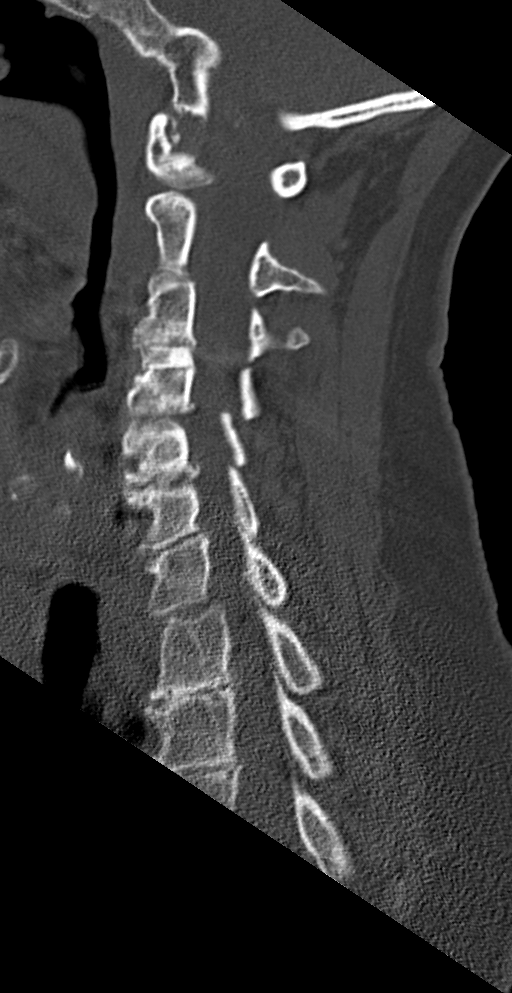
[im 25/50  soft-tissue]
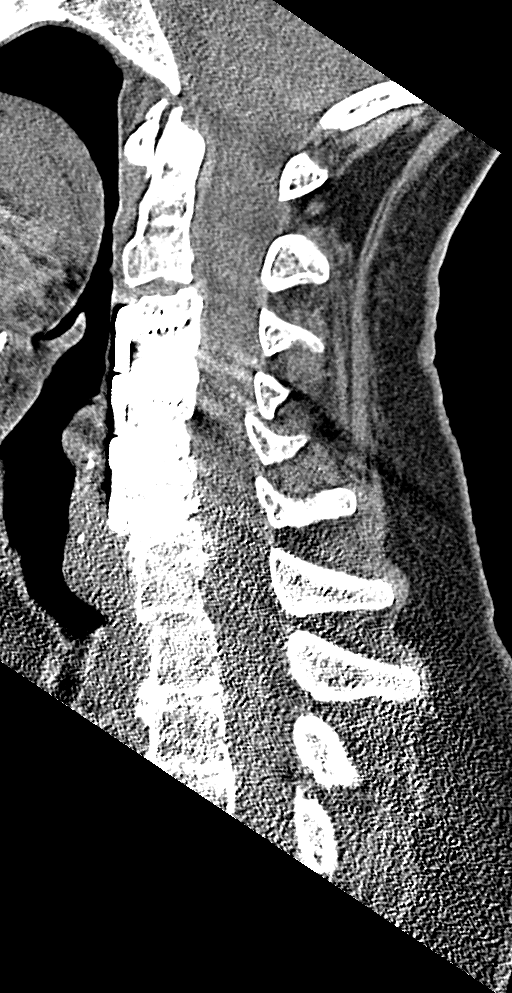
[im 25/50  bone]
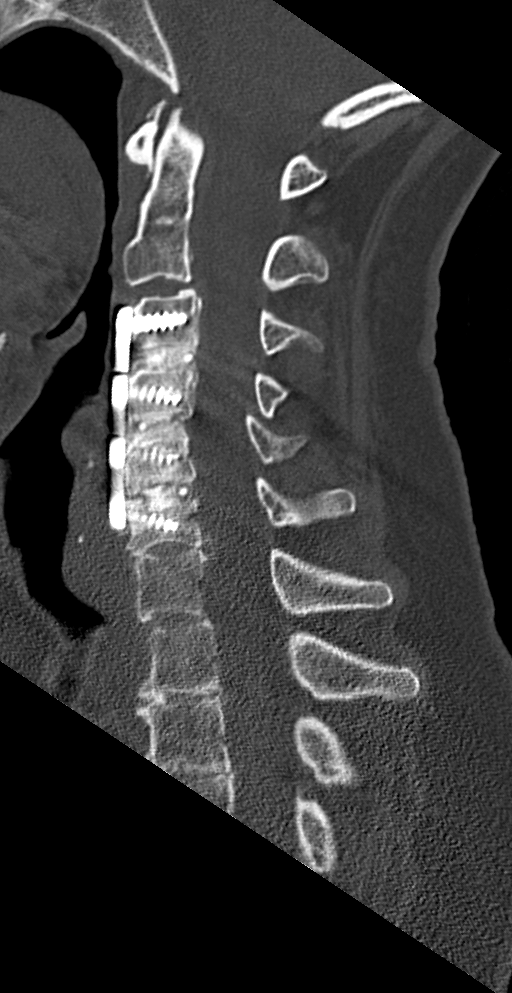
[im 29/50  bone]
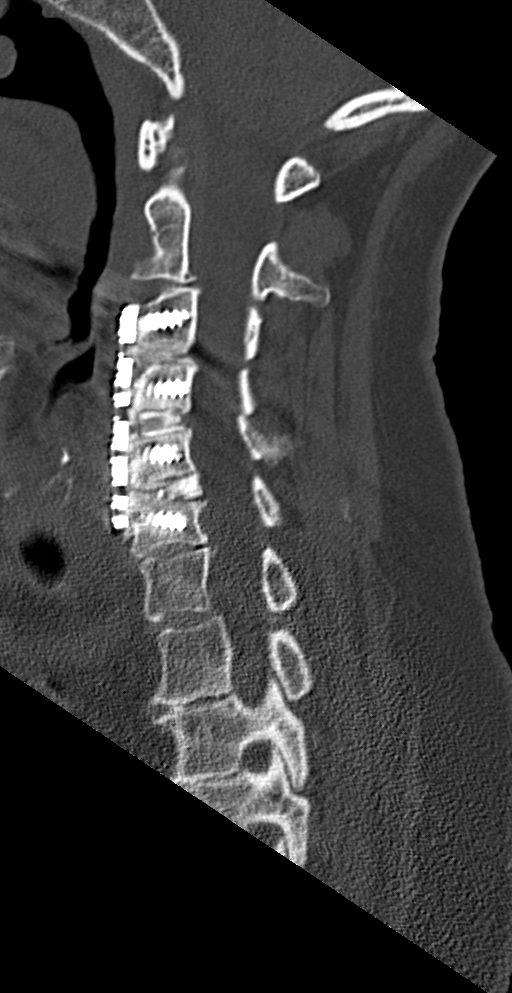
[im 33/50  bone]
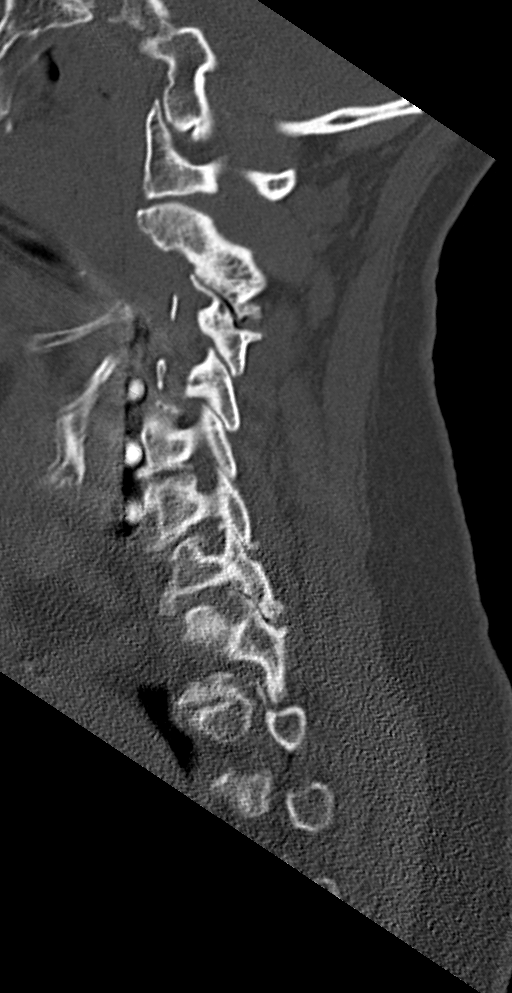

[Series 8: orthogonal bone · axial · 0.21mm/px · z∈[-200,-81]mm · 6 of 102 slices shown, 8 images]
[im 15/102  soft-tissue]
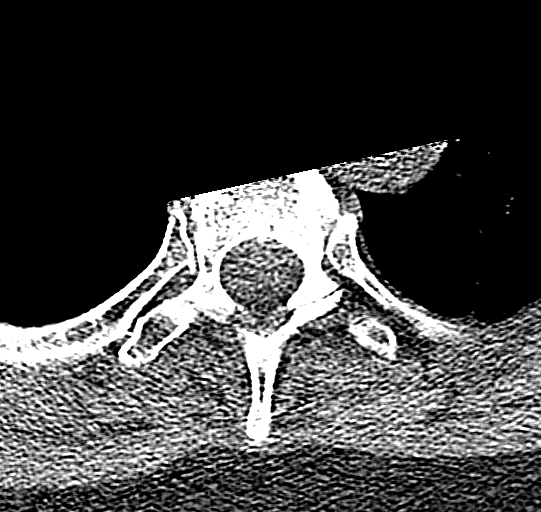
[im 15/102  bone]
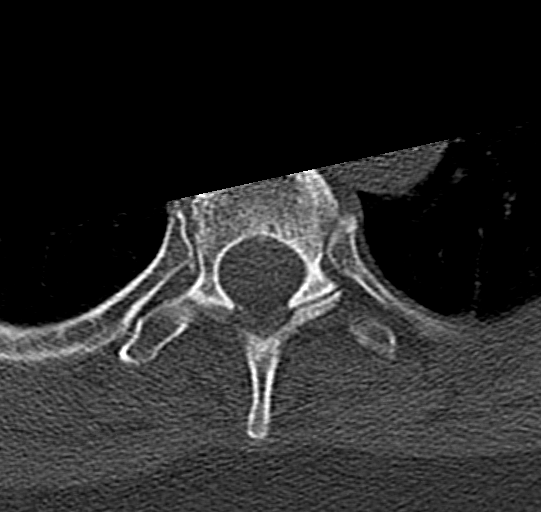
[im 29/102  bone]
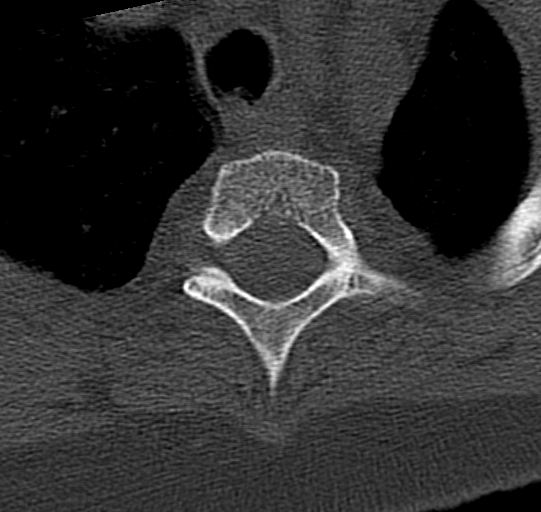
[im 44/102  bone]
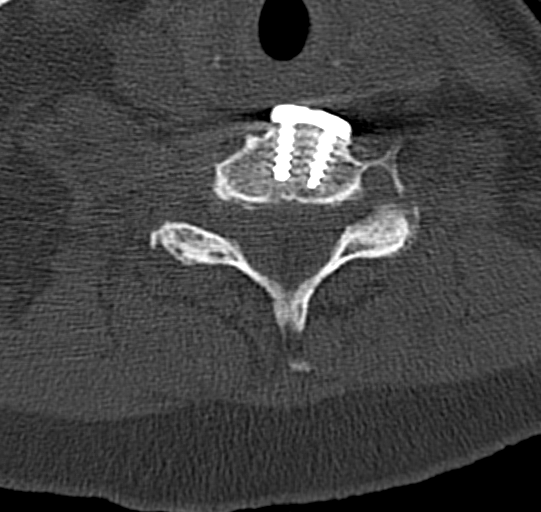
[im 58/102  bone]
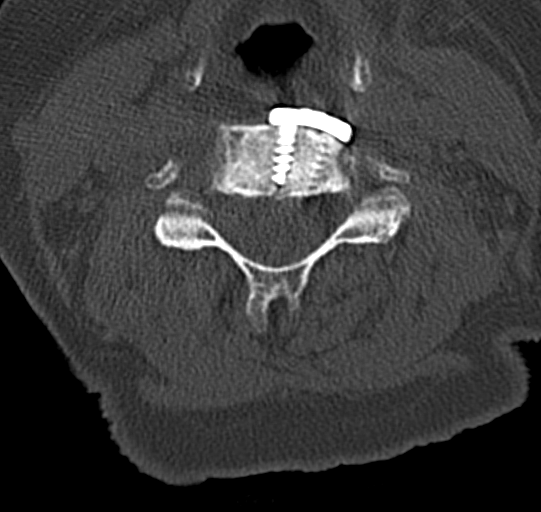
[im 73/102  soft-tissue]
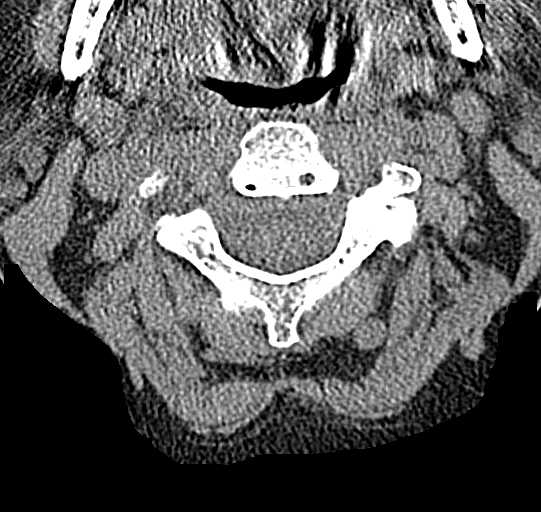
[im 73/102  bone]
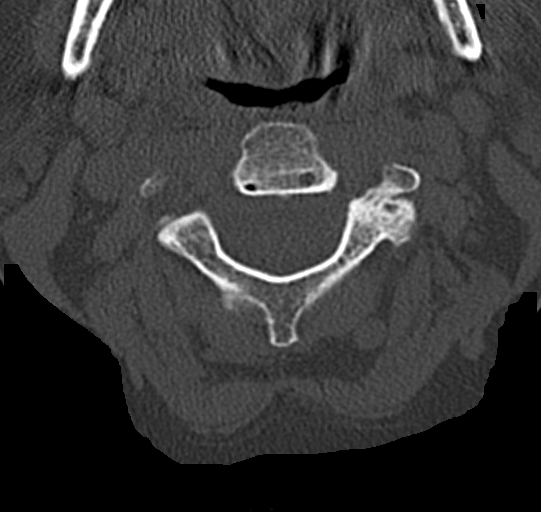
[im 87/102  bone]
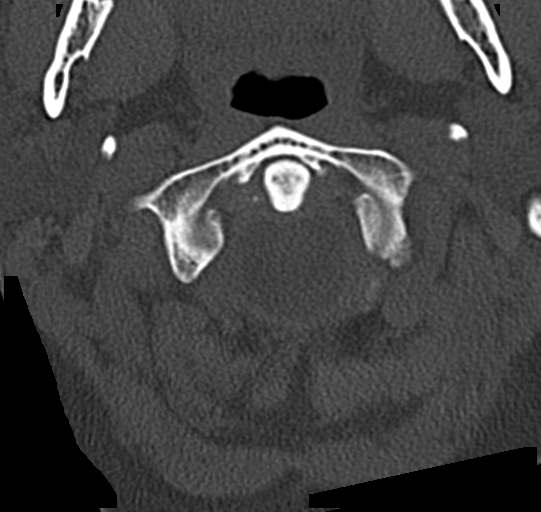

[11 of 28 positions shown; findings below may reference images not displayed]

FINDINGS: Alignment: Straightening of cervical lordosis. Minimal grade 1 C7-T1
anterolisthesis. Sequela of C3-6 ACDF. Hardware is well seated and
intact.

Skull base and vertebrae: No acute fracture. No primary bone lesion
or focal pathologic process.

Soft tissues and spinal canal: No prevertebral fluid or swelling. No
visible canal hematoma.

Disc levels: C3-6 interbody spacers. Mild C2-3 and C7-T1 disc space
loss. Moderate C6-7 disc space loss. Multilevel endplate
degenerative spurring and facet hypertrophy.

Patent bony spinal canal. Mild to moderate left C2-3 bony neural
foraminal narrowing. Remaining neural foramina are patent.

Upper chest: Clear lung apices. Aortic atherosclerotic
calcifications.

Other: None.
IMPRESSION: 1. Sequela of C3-6 ACDF without adverse features.
2. Multilevel spondylosis. Mild-to-moderate left C2-3 bony neural
foraminal narrowing.
3. Aortic atherosclerosis.

Aortic Atherosclerosis ([NV]-[NV]).

## 2020-04-20 DIAGNOSIS — S129XXA Fracture of neck, unspecified, initial encounter: Secondary | ICD-10-CM | POA: Diagnosis not present

## 2020-05-02 DIAGNOSIS — M960 Pseudarthrosis after fusion or arthrodesis: Secondary | ICD-10-CM | POA: Diagnosis not present

## 2020-05-08 ENCOUNTER — Other Ambulatory Visit: Payer: Self-pay | Admitting: Physician Assistant

## 2020-05-08 DIAGNOSIS — N3281 Overactive bladder: Secondary | ICD-10-CM

## 2020-05-08 DIAGNOSIS — N3941 Urge incontinence: Secondary | ICD-10-CM

## 2020-06-01 DIAGNOSIS — Z6826 Body mass index (BMI) 26.0-26.9, adult: Secondary | ICD-10-CM | POA: Diagnosis not present

## 2020-06-01 DIAGNOSIS — M5416 Radiculopathy, lumbar region: Secondary | ICD-10-CM | POA: Diagnosis not present

## 2020-06-01 DIAGNOSIS — M7061 Trochanteric bursitis, right hip: Secondary | ICD-10-CM | POA: Diagnosis not present

## 2020-06-01 DIAGNOSIS — I10 Essential (primary) hypertension: Secondary | ICD-10-CM | POA: Diagnosis not present

## 2020-06-12 ENCOUNTER — Other Ambulatory Visit: Payer: Self-pay

## 2020-06-12 ENCOUNTER — Encounter: Payer: Self-pay | Admitting: Dermatology

## 2020-06-12 ENCOUNTER — Ambulatory Visit: Payer: PPO | Admitting: Dermatology

## 2020-06-12 DIAGNOSIS — T148XXA Other injury of unspecified body region, initial encounter: Secondary | ICD-10-CM

## 2020-06-12 DIAGNOSIS — S6982XA Other specified injuries of left wrist, hand and finger(s), initial encounter: Secondary | ICD-10-CM

## 2020-06-12 DIAGNOSIS — W5503XA Scratched by cat, initial encounter: Secondary | ICD-10-CM

## 2020-06-12 MED ORDER — DOXYCYCLINE MONOHYDRATE 100 MG PO CAPS: 100.0000 mg | ORAL_CAPSULE | Freq: Two times a day (BID) | ORAL | 0 refills | Status: AC

## 2020-06-12 NOTE — Progress Notes (Signed)
   Follow-Up Visit   Subjective  Sarah Todd is a 74 y.o. female who presents for the following: Follow-up (Patient here today for 3 month ISK follow up at left ant base of neck. ). Patient also has a spot at left hand/wrist she would like looked at. Her cat scratched her hand last night and it is now bruised and swollen. Some discomfort, patient has iced area.   The following portions of the chart were reviewed this encounter and updated as appropriate:  Tobacco  Allergies  Meds  Problems  Med Hx  Surg Hx  Fam Hx     Review of Systems:  No other skin or systemic complaints except as noted in HPI or Assessment and Plan.  Objective  Well appearing patient in no apparent distress; mood and affect are within normal limits.  A focused examination was performed including neck, left hand and arm. Relevant physical exam findings are noted in the Assessment and Plan.  Objective  Left Hand - Posterior: hematoma  Images       Assessment & Plan  Hematoma of left dorsum hand from traumatic injury from cat scratch with possible element of cellulitis Left Hand - Posterior With traumatic injury/cat scratch  Start doxycycline 100mg  twice daily with food x 7 days.  Recommend cold compresses, elevation, tylenol.  Patient advised if worsens, may need to be drained.  Discussed he can follow-up appointment but patient is not want to she will call us if does not improve or if worsens.  Ordered Medications: doxycycline (MONODOX) 100 MG capsule  Seborrheic Keratoses -site treated of the left anterior base of neck is clear today. - Stuck-on, waxy, tan-brown papules and plaques  - Discussed benign etiology and prognosis. - Observe - Call for any changes  Return if symptoms worsen or fail to improve.  Graciella Belton, RMA, am acting as scribe for Sarina Ser, MD . Documentation: I have reviewed the above documentation for accuracy and completeness, and I agree with the  above.  Sarina Ser, MD

## 2020-06-12 NOTE — Patient Instructions (Addendum)
Doxycycline should be taken with food to prevent nausea. Do not lay down for 30 minutes after taking. Be cautious with sun exposure and use good sun protection while on this medication. Pregnant women should not take this medication.   Recommend cold compresses, elevation and tylenol as needed.

## 2020-06-20 DIAGNOSIS — Z6826 Body mass index (BMI) 26.0-26.9, adult: Secondary | ICD-10-CM | POA: Diagnosis not present

## 2020-06-20 DIAGNOSIS — I10 Essential (primary) hypertension: Secondary | ICD-10-CM | POA: Diagnosis not present

## 2020-06-20 DIAGNOSIS — H0011 Chalazion right upper eyelid: Secondary | ICD-10-CM | POA: Diagnosis not present

## 2020-06-20 DIAGNOSIS — M25551 Pain in right hip: Secondary | ICD-10-CM | POA: Diagnosis not present

## 2020-06-21 ENCOUNTER — Other Ambulatory Visit: Payer: Self-pay | Admitting: Student

## 2020-06-21 DIAGNOSIS — M25551 Pain in right hip: Secondary | ICD-10-CM

## 2020-06-28 ENCOUNTER — Ambulatory Visit
Admission: RE | Admit: 2020-06-28 | Discharge: 2020-06-28 | Disposition: A | Discharge status: Home / Self Care | Payer: PPO | Source: Ambulatory Visit | Attending: Student | Admitting: Student

## 2020-06-28 ENCOUNTER — Other Ambulatory Visit: Payer: Self-pay

## 2020-06-28 DIAGNOSIS — M25551 Pain in right hip: Secondary | ICD-10-CM

## 2020-06-28 IMAGING — XA DG FLUORO GUIDE NDL PLC/BX
1 series · 1 of 1 positions shown · IV contrast (omnipaque)
Comparison: None.

CLINICAL DATA: Right hip pain. No improvement following bursal
injection.

EXAM:
Right hip joint injection
TECHNIQUE: The overlying skin was prepped with Betadine, draped in the usual
sterile fashion, and infiltrated locally with 1% Lidocaine. A 22
gauge spinal needle was advanced under fluoroscopic observation to
the lateral femoral neck. Confirmatory injection of less than 1 ml
of Omnipaque 180 demonstrates intra-articular spread without
intravascular component.
120 mg Depo-Medrol and 3 ml 1% lidocaine were then instilled. The
procedure was well tolerated. The patient was observed for a short
period of time then discharged in good condition.
FLUOROSCOPY TIME:  0 minutes 20 seconds. 22.82 micro gray meter
squared

[Series 1: ortho standard · 1 of 1 slices shown]
[im 1/1]
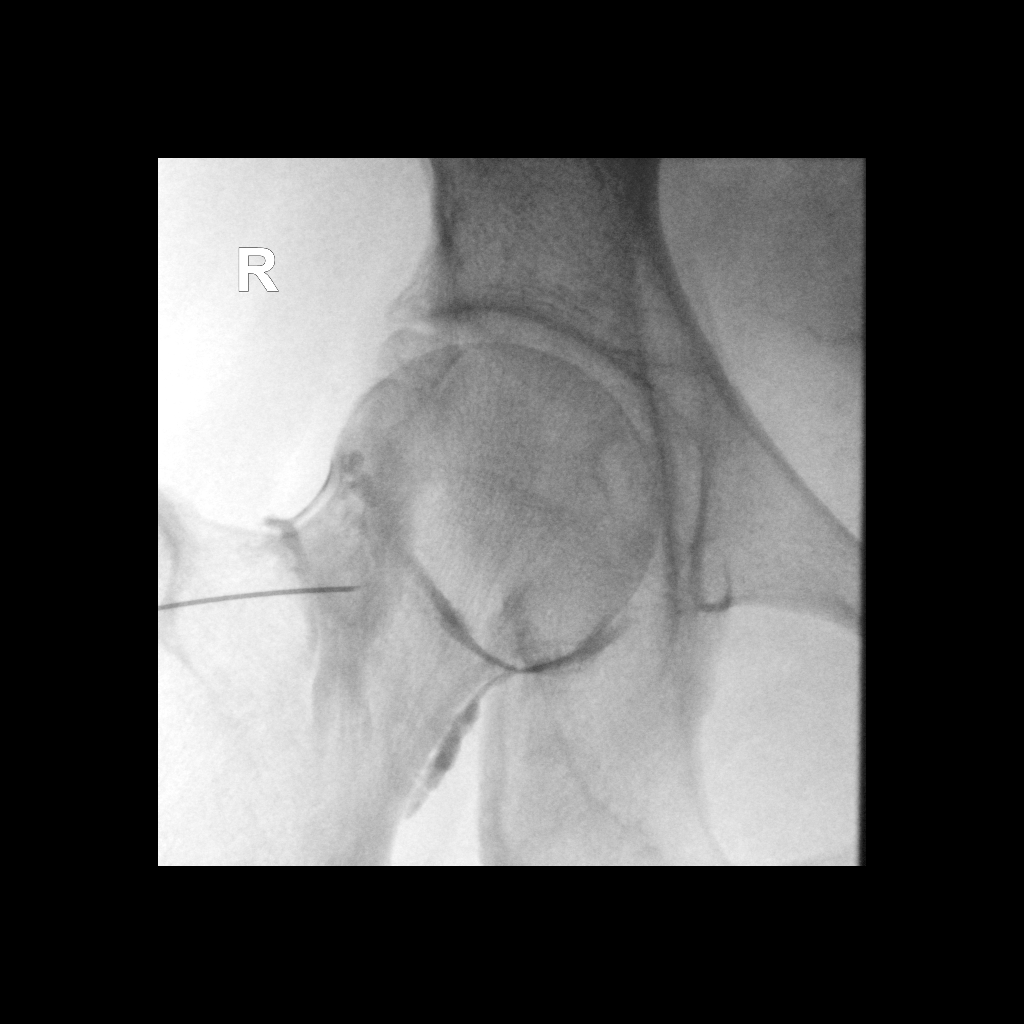

[1 of 1 positions shown; findings below may reference images not displayed]

IMPRESSION: Technically successful right hip injection under fluoroscopy.

## 2020-06-28 MED ORDER — IOPAMIDOL (ISOVUE-M 200) INJECTION 41%
1.0000 mL | Freq: Once | INTRAMUSCULAR | Status: AC
  Administered 2020-06-28: 1 mL via INTRA_ARTICULAR

## 2020-06-28 MED ORDER — METHYLPREDNISOLONE ACETATE 40 MG/ML INJ SUSP (RADIOLOG
120.0000 mg | Freq: Once | INTRAMUSCULAR | Status: AC
  Administered 2020-06-28: 120 mg via INTRA_ARTICULAR

## 2020-07-20 DIAGNOSIS — M25551 Pain in right hip: Secondary | ICD-10-CM | POA: Diagnosis not present

## 2020-08-03 ENCOUNTER — Telehealth: Payer: Self-pay | Admitting: Physician Assistant

## 2020-08-03 NOTE — Telephone Encounter (Signed)
Patient states that she missed a call. Perhaps something about her flu shot. Cb- [phone] or [phone]

## 2020-08-04 NOTE — Telephone Encounter (Signed)
I believe it was a call regarding metrics. If patient calls back and wants the Influenza vaccine please schedule her. Thanks

## 2020-08-08 NOTE — Telephone Encounter (Signed)
Pt had her flu shot at [facility] 08-04-2020

## 2020-08-15 DIAGNOSIS — S73191A Other sprain of right hip, initial encounter: Secondary | ICD-10-CM | POA: Diagnosis not present

## 2020-08-15 DIAGNOSIS — M25551 Pain in right hip: Secondary | ICD-10-CM | POA: Diagnosis not present

## 2020-08-15 DIAGNOSIS — M1611 Unilateral primary osteoarthritis, right hip: Secondary | ICD-10-CM | POA: Diagnosis not present

## 2020-08-17 DIAGNOSIS — Z6826 Body mass index (BMI) 26.0-26.9, adult: Secondary | ICD-10-CM | POA: Diagnosis not present

## 2020-08-17 DIAGNOSIS — M47812 Spondylosis without myelopathy or radiculopathy, cervical region: Secondary | ICD-10-CM | POA: Diagnosis not present

## 2020-08-17 DIAGNOSIS — I10 Essential (primary) hypertension: Secondary | ICD-10-CM | POA: Diagnosis not present

## 2020-08-22 DIAGNOSIS — S73101A Unspecified sprain of right hip, initial encounter: Secondary | ICD-10-CM | POA: Diagnosis not present

## 2020-08-22 DIAGNOSIS — M47812 Spondylosis without myelopathy or radiculopathy, cervical region: Secondary | ICD-10-CM | POA: Diagnosis not present

## 2020-08-22 DIAGNOSIS — M961 Postlaminectomy syndrome, not elsewhere classified: Secondary | ICD-10-CM | POA: Diagnosis not present

## 2020-09-21 DIAGNOSIS — M47812 Spondylosis without myelopathy or radiculopathy, cervical region: Secondary | ICD-10-CM | POA: Diagnosis not present

## 2020-10-04 ENCOUNTER — Other Ambulatory Visit: Payer: Self-pay | Admitting: Physician Assistant

## 2020-10-04 DIAGNOSIS — I10 Essential (primary) hypertension: Secondary | ICD-10-CM

## 2020-10-13 DIAGNOSIS — M47812 Spondylosis without myelopathy or radiculopathy, cervical region: Secondary | ICD-10-CM | POA: Diagnosis not present

## 2020-10-13 DIAGNOSIS — M961 Postlaminectomy syndrome, not elsewhere classified: Secondary | ICD-10-CM | POA: Diagnosis not present

## 2020-10-18 ENCOUNTER — Telehealth: Payer: Self-pay

## 2020-10-18 NOTE — Telephone Encounter (Signed)
Pt needs a virtual visit.  Left message to call back.  PEC please offer her an appointment when she calls back.   Thanks,   -Mickel Baas

## 2020-10-18 NOTE — Telephone Encounter (Signed)
Copied from [place] [id number]. Topic: General - Other >> Oct 18, 2020 12:23 PM Antonieta Iba C wrote: Reason for CRM: pt called in for assistance. Pt says that she is getting over a cold but still has some mucus and a cough. Pt would like to know if provider could suggest or prescribe something that she could take to help clear her up?    Pharmacy: [facility], [place]  Phone:  [phone] Fax:  [phone]   Please assist pt further.

## 2020-10-19 ENCOUNTER — Telehealth: Payer: PPO | Admitting: Physician Assistant

## 2020-10-19 ENCOUNTER — Telehealth (INDEPENDENT_AMBULATORY_CARE_PROVIDER_SITE_OTHER): Payer: PPO | Admitting: Physician Assistant

## 2020-10-19 ENCOUNTER — Encounter: Payer: Self-pay | Admitting: Physician Assistant

## 2020-10-19 DIAGNOSIS — J0140 Acute pansinusitis, unspecified: Secondary | ICD-10-CM

## 2020-10-19 MED ORDER — AMOXICILLIN-POT CLAVULANATE 875-125 MG PO TABS: 1.0000 | ORAL_TABLET | Freq: Two times a day (BID) | ORAL | 0 refills | Status: DC

## 2020-10-19 NOTE — Progress Notes (Signed)
Virtual telephone visit    Virtual Visit via Telephone Note   This visit type was conducted due to national recommendations for restrictions regarding the COVID-19 Pandemic (e.g. social distancing) in an effort to limit this patient's exposure and mitigate transmission in our community. Due to her co-morbid illnesses, this patient is at least at moderate risk for complications without adequate follow up. This format is felt to be most appropriate for this patient at this time. The patient did not have access to video technology or had technical difficulties with video requiring transitioning to audio format only (telephone). Physical exam was limited to content and character of the telephone converstion.    Patient location: Home Provider location: [facility]  I discussed the limitations of evaluation and management by telemedicine and the availability of in person appointments. The patient expressed understanding and agreed to proceed.   Visit Date: 10/19/2020  Today's healthcare provider: Mar Daring, PA-C   Chief Complaint  Patient presents with  . URI   Subjective    URI  This is a new problem. The current episode started 1 to 4 weeks ago (Cold started about two weeks ago.  ). The problem has been waxing and waning. Associated symptoms include congestion, rhinorrhea and sinus pain. Pertinent negatives include no coughing, diarrhea, ear pain, headaches, nausea, plugged ear sensation, sneezing or sore throat. She has tried acetaminophen for the symptoms. The treatment provided moderate relief.      Patient Active Problem List   Diagnosis Date Noted  . Personal history of colonic polyps   . HNP (herniated nucleus pulposus), cervical 07/21/2019  . Cervical radiculopathy 06/21/2019  . Family history of colon cancer in mother 12/18/2017  . Pain in elbow 11/07/2016  . Candidiasis of breast 07/20/2015  . Atrophic vaginitis 07/20/2015  . Constipation 06/20/2015  . Skin lesion  of face 06/20/2015  . Tinea corporis 05/19/2015  . Allergic rhinitis 02/06/2015  . Arthritis 02/06/2015  . BP (high blood pressure) 02/06/2015  . OP (osteoporosis) 02/06/2015  . Shoulder strain 02/06/2015  . Dermatophytosis of groin 02/06/2015  . Candida vaginitis 02/06/2015   Past Medical History:  Diagnosis Date  . Allergy   . Arthritis    hands  . Family history of adverse reaction to anesthesia    sister but not sure of what happened  . Hypertension   . Osteoporosis   . Varicose veins    Mild   Social History   Tobacco Use  . Smoking status: Never Smoker  . Smokeless tobacco: Never Used  Vaping Use  . Vaping Use: Never used  Substance Use Topics  . Alcohol use: No  . Drug use: No   Allergies  Allergen Reactions  . Lisinopril Anaphylaxis  . Shrimp [Shellfish Allergy] Anaphylaxis  . Aspercreme [Trolamine Salicylate] Rash  . Lidocaine Rash    Patches broke her skin out    Medications: Outpatient Medications Prior to Visit  Medication Sig  . acetaminophen (TYLENOL) 650 MG CR tablet Take 1,300 mg by mouth every 8 (eight) hours as needed for pain.   Marland Kitchen aspirin 81 MG tablet Take 81 mg by mouth daily.   Marland Kitchen CALCIUM CITRATE PO Take 600 mg by mouth daily.   . Cholecalciferol (VITAMIN D-3 PO) Take 1,000 Units by mouth daily.   . diphenhydramine-acetaminophen (TYLENOL PM) 25-500 MG TABS tablet Take 2 tablets by mouth at bedtime as needed (sleep /pain).   Marland Kitchen estradiol (ESTRACE) 0.5 MG tablet TAKE ONE TABLET EVERY DAY (Patient  taking differently: Take 0.5 mg by mouth daily.)  . gabapentin (NEURONTIN) 600 MG tablet Take 600-900 mg by mouth 2 (two) times daily. Take 600 mg by mouth in the morning and 900 mg at night  . hydrocortisone 2.5 % cream Apply topically 2 (two) times daily. (Patient taking differently: Apply 1 application topically 2 (two) times daily as needed (irritation). As needed)  . losartan (COZAAR) 50 MG tablet TAKE ONE TABLET EVERY MORNING  . meloxicam (MOBIC)  15 MG tablet Take 15 mg by mouth daily.  . Multiple Vitamin (MULTIVITAMIN) capsule Take 1 capsule by mouth daily.   . Omega 3 1200 MG CAPS Take 1,200-2,400 mg by mouth See admin instructions. Take 1200 mg by mouth in the morning and 2400 mg at night  . triamcinolone cream (KENALOG) 0.1 % Apply 1 application topically 2 (two) times daily. (Patient taking differently: Apply 1 application topically 2 (two) times daily as needed (irritation). As needed)  . vitamin C (ASCORBIC ACID) 500 MG tablet Take 500 mg by mouth daily.    No facility-administered medications prior to visit.    Review of Systems  Constitutional: Positive for fatigue. Negative for activity change, appetite change, chills, diaphoresis, fever and unexpected weight change.  HENT: Positive for congestion, postnasal drip, rhinorrhea, sinus pressure, sinus pain and tinnitus. Negative for ear discharge, ear pain, hearing loss, sneezing, sore throat and trouble swallowing.   Eyes: Negative.   Respiratory: Negative.  Negative for cough.   Gastrointestinal: Negative.  Negative for diarrhea and nausea.  Neurological: Negative for dizziness, light-headedness and headaches.      Objective    There were no vitals taken for this visit.     Assessment & Plan     1. Acute non-recurrent pansinusitis Worsening symptoms that have not responded to OTC medications. Will give augmentin as below. Continue allergy medications. Stay well hydrated and get plenty of rest. Call if no symptom improvement or if symptoms worsen. - amoxicillin-clavulanate (AUGMENTIN) 875-125 MG tablet; Take 1 tablet by mouth 2 (two) times daily.  Dispense: 20 tablet; Refill: 0   No follow-ups on file.    I discussed the assessment and treatment plan with the patient. The patient was provided an opportunity to ask questions and all were answered. The patient agreed with the plan and demonstrated an understanding of the instructions.   The patient was advised to  call back or seek an in-person evaluation if the symptoms worsen or if the condition fails to improve as anticipated.  I provided 11 minutes of non-face-to-face time during this encounter.  Reynolds Bowl, PA-C, have reviewed all documentation for this visit. The documentation on 10/19/20 for the exam, diagnosis, procedures, and orders are all accurate and complete.  [place] [facility] [phone] (phone) [phone] (fax)  [place]

## 2020-10-19 NOTE — Telephone Encounter (Signed)
Pt has been scheduled for today with Anderson Malta.

## 2020-10-19 NOTE — Patient Instructions (Signed)

## 2020-10-24 DIAGNOSIS — M1611 Unilateral primary osteoarthritis, right hip: Secondary | ICD-10-CM | POA: Diagnosis not present

## 2020-11-02 ENCOUNTER — Other Ambulatory Visit: Payer: Self-pay | Admitting: Physician Assistant

## 2020-11-02 DIAGNOSIS — I10 Essential (primary) hypertension: Secondary | ICD-10-CM

## 2020-11-06 ENCOUNTER — Telehealth: Payer: PPO | Admitting: Family Medicine

## 2020-11-06 ENCOUNTER — Ambulatory Visit (INDEPENDENT_AMBULATORY_CARE_PROVIDER_SITE_OTHER): Payer: PPO | Admitting: Family Medicine

## 2020-11-06 ENCOUNTER — Other Ambulatory Visit: Payer: Self-pay

## 2020-11-06 ENCOUNTER — Encounter: Payer: Self-pay | Admitting: Family Medicine

## 2020-11-06 VITALS — BP 135/64 | HR 88 | Temp 98.2°F | Wt 148.0 lb

## 2020-11-06 DIAGNOSIS — M5416 Radiculopathy, lumbar region: Secondary | ICD-10-CM

## 2020-11-06 DIAGNOSIS — N3941 Urge incontinence: Secondary | ICD-10-CM | POA: Insufficient documentation

## 2020-11-06 DIAGNOSIS — I10 Essential (primary) hypertension: Secondary | ICD-10-CM

## 2020-11-06 DIAGNOSIS — E782 Mixed hyperlipidemia: Secondary | ICD-10-CM | POA: Diagnosis not present

## 2020-11-06 DIAGNOSIS — L989 Disorder of the skin and subcutaneous tissue, unspecified: Secondary | ICD-10-CM

## 2020-11-06 DIAGNOSIS — Z800 Family history of malignant neoplasm of digestive organs: Secondary | ICD-10-CM

## 2020-11-06 MED ORDER — LOSARTAN POTASSIUM 50 MG PO TABS: 50.0000 mg | ORAL_TABLET | Freq: Every morning | ORAL | 3 refills | Status: DC

## 2020-11-06 MED ORDER — TRIAMCINOLONE ACETONIDE 0.1 % EX CREA: 1.0000 "application " | TOPICAL_CREAM | Freq: Two times a day (BID) | CUTANEOUS | 0 refills | Status: DC

## 2020-11-06 NOTE — Assessment & Plan Note (Signed)
Continue gabapentin at current dose ?

## 2020-11-06 NOTE — Assessment & Plan Note (Signed)
Well controlled Continue current medications Recheck metabolic panel F/u in 6 months  

## 2020-11-06 NOTE — Assessment & Plan Note (Signed)
Failed oxybutinin Prefers not to try another medication Continue pads for symptom management

## 2020-11-06 NOTE — Assessment & Plan Note (Signed)
Fam hx colon cancer - wants colonoscopy q45yr regardless of age - doesnt want to see Dr Allen Norris - last in 5/21

## 2020-11-06 NOTE — Progress Notes (Signed)
Established patient visit   Patient: Sarah Todd   DOB: [birth year]   75 y.o. Female  MRN: [id number] Visit Date: 11/06/2020  Today's healthcare provider: Lavon Paganini, MD   Chief Complaint  Patient presents with  . Hypertension   Subjective    HPI  Hypertension, follow-up  BP Readings from Last 3 Encounters:  11/06/20 135/64  02/29/20 (!) 142/72  01/25/20 (!) 160/90   Wt Readings from Last 3 Encounters:  11/06/20 148 lb (67.1 kg)  02/29/20 147 lb 6.4 oz (66.9 kg)  01/25/20 140 lb 4.5 oz (63.6 kg)     She was last seen for hypertension 11 months ago.  BP at that visit was 106/73. Management since that visit includes no changes.  She reports excellent compliance with treatment. She is not having side effects.  She is following a Regular diet. She is not exercising. She does not smoke.  Use of agents associated with hypertension: none.   Outside blood pressures are not being checked. Symptoms: No chest pain No chest pressure  No palpitations No syncope  No dyspnea No orthopnea  No paroxysmal nocturnal dyspnea No lower extremity edema   Pertinent labs: Lab Results  Component Value Date   CHOL 189 12/20/2019   HDL 75 12/20/2019   LDLCALC 85 12/20/2019   TRIG 171 (H) 12/20/2019   CHOLHDL 2.5 12/20/2019   Lab Results  Component Value Date   NA 133 (L) 12/20/2019   K 4.4 12/20/2019   CREATININE 0.63 12/20/2019   GFRNONAA 88 12/20/2019   GFRAA 101 12/20/2019   GLUCOSE 88 12/20/2019     The 10-year ASCVD risk score Mikey Bussing DC Jr., et al., 2013) is: 25.9%   ---------------------------------------------------------------------------------------------------   Patient Active Problem List   Diagnosis Date Noted  . Urge incontinence of urine 11/06/2020  . Personal history of colonic polyps   . HNP (herniated nucleus pulposus), cervical 07/21/2019  . Lumbar radiculopathy 06/21/2019  . Family history of colon cancer in mother 12/18/2017  .  Pain in elbow 11/07/2016  . Candidiasis of breast 07/20/2015  . Atrophic vaginitis 07/20/2015  . Constipation 06/20/2015  . Skin lesion of face 06/20/2015  . Tinea corporis 05/19/2015  . Allergic rhinitis 02/06/2015  . Arthritis 02/06/2015  . BP (high blood pressure) 02/06/2015  . OP (osteoporosis) 02/06/2015  . Shoulder strain 02/06/2015  . Dermatophytosis of groin 02/06/2015  . Candida vaginitis 02/06/2015   Past Medical History:  Diagnosis Date  . Allergy   . Arthritis    hands  . Family history of adverse reaction to anesthesia    sister but not sure of what happened  . Hypertension   . Osteoporosis   . Varicose veins    Mild   Social History   Tobacco Use  . Smoking status: Never Smoker  . Smokeless tobacco: Never Used  Vaping Use  . Vaping Use: Never used  Substance Use Topics  . Alcohol use: No  . Drug use: No   Allergies  Allergen Reactions  . Lisinopril Anaphylaxis  . Shrimp [Shellfish Allergy] Anaphylaxis  . Aspercreme [Trolamine Salicylate] Rash  . Lidocaine Rash    Patches broke her skin out     Medications: Outpatient Medications Prior to Visit  Medication Sig  . acetaminophen (TYLENOL) 650 MG CR tablet Take 1,300 mg by mouth every 8 (eight) hours as needed for pain.   Marland Kitchen aspirin 81 MG tablet Take 81 mg by mouth daily.   Marland Kitchen CALCIUM CITRATE PO  Take 600 mg by mouth daily.   . Cholecalciferol (VITAMIN D-3 PO) Take 1,000 Units by mouth daily.   . diphenhydramine-acetaminophen (TYLENOL PM) 25-500 MG TABS tablet Take 2 tablets by mouth at bedtime as needed (sleep /pain).   Marland Kitchen estradiol (ESTRACE) 0.5 MG tablet TAKE ONE TABLET EVERY DAY (Patient taking differently: Take 0.5 mg by mouth daily.)  . gabapentin (NEURONTIN) 600 MG tablet Take 600-900 mg by mouth 2 (two) times daily. Take 600 mg by mouth in the morning and 900 mg at night  . hydrocortisone 2.5 % cream Apply topically 2 (two) times daily. (Patient taking differently: Apply 1 application topically 2  (two) times daily as needed (irritation). As needed)  . meloxicam (MOBIC) 15 MG tablet Take 15 mg by mouth daily.  . Multiple Vitamin (MULTIVITAMIN) capsule Take 1 capsule by mouth daily.   . Omega 3 1200 MG CAPS Take 1,200-2,400 mg by mouth See admin instructions. Take 1200 mg by mouth in the morning and 2400 mg at night  . vitamin C (ASCORBIC ACID) 500 MG tablet Take 500 mg by mouth daily.   . [DISCONTINUED] losartan (COZAAR) 50 MG tablet TAKE ONE TABLET EVERY MORNING  . [DISCONTINUED] triamcinolone cream (KENALOG) 0.1 % Apply 1 application topically 2 (two) times daily. (Patient taking differently: Apply 1 application topically 2 (two) times daily as needed (irritation). As needed)  . [DISCONTINUED] amoxicillin-clavulanate (AUGMENTIN) 875-125 MG tablet Take 1 tablet by mouth 2 (two) times daily.   No facility-administered medications prior to visit.    Review of Systems  Constitutional: Positive for fatigue. Negative for activity change, appetite change, chills, diaphoresis, fever and unexpected weight change.  Respiratory: Negative.   Cardiovascular: Negative.   Gastrointestinal: Negative.   Neurological: Negative for dizziness, syncope, light-headedness and headaches.        Objective    BP 135/64 (BP Location: Left Arm, Patient Position: Sitting, Cuff Size: Large)   Pulse 88   Temp 98.2 F (36.8 C) (Oral)   Wt 148 lb (67.1 kg)   SpO2 98%   BMI 27.07 kg/m    Physical Exam Vitals reviewed.  Constitutional:      General: She is not in acute distress.    Appearance: Normal appearance. She is well-developed. She is not diaphoretic.  HENT:     Head: Normocephalic and atraumatic.  Eyes:     General: No scleral icterus.    Conjunctiva/sclera: Conjunctivae normal.  Neck:     Thyroid: No thyromegaly.  Cardiovascular:     Rate and Rhythm: Normal rate and regular rhythm.     Pulses: Normal pulses.     Heart sounds: Normal heart sounds. No murmur heard.   Pulmonary:      Effort: Pulmonary effort is normal. No respiratory distress.     Breath sounds: Normal breath sounds. No wheezing, rhonchi or rales.  Musculoskeletal:     Cervical back: Neck supple.     Right lower leg: No edema.     Left lower leg: No edema.  Lymphadenopathy:     Cervical: No cervical adenopathy.  Skin:    General: Skin is warm and dry.     Findings: No rash.  Neurological:     Mental Status: She is alert and oriented to person, place, and time. Mental status is at baseline.  Psychiatric:        Mood and Affect: Mood normal.        Behavior: Behavior normal.       No results found  for any visits on 11/06/20.  Assessment & Plan     Problem List Items Addressed This Visit      Cardiovascular and Mediastinum   BP (high blood pressure) - Primary    Well controlled Continue current medications Recheck metabolic panel F/u in 6 months       Relevant Medications   losartan (COZAAR) 50 MG tablet   Other Relevant Orders   Lipid panel   Comprehensive metabolic panel     Nervous and Auditory   Lumbar radiculopathy    Continue gabapentin at current dose        Musculoskeletal and Integument   Skin lesion of face   Relevant Medications   triamcinolone (KENALOG) 0.1 %     Other   Family history of colon cancer in mother    Fam hx colon cancer - wants colonoscopy q71yr regardless of age - doesnt want to see Dr Allen Norris - last in 5/21      Urge incontinence of urine    Failed oxybutinin Prefers not to try another medication Continue pads for symptom management       Other Visit Diagnoses    Essential hypertension       Relevant Medications   losartan (COZAAR) 50 MG tablet   Mixed hyperlipidemia       Relevant Medications   losartan (COZAAR) 50 MG tablet   Other Relevant Orders   Lipid panel   Comprehensive metabolic panel       No skin lesion today - pulled for refill  Return in about 7 months (around 06/08/2021) for chronic disease f/u.      I, Lavon Paganini, MD, have reviewed all documentation for this visit. The documentation on 11/06/20 for the exam, diagnosis, procedures, and orders are all accurate and complete.   Issam Carlyon, Dionne Bucy, MD, MPH [facility]

## 2020-11-06 NOTE — Patient Instructions (Signed)
Goal blood pressure <140/90 consistently Check 1-2 times per week to see a trend   Blood Pressure Record Sheet To take your blood pressure, you will need a blood pressure machine. You can buy a blood pressure machine (blood pressure monitor) at your clinic, drug store, or online. When choosing one, consider:  An automatic monitor that has an arm cuff.  A cuff that wraps snugly around your upper arm. You should be able to fit only one finger between your arm and the cuff.  A device that stores blood pressure reading results.  Do not choose a monitor that measures your blood pressure from your wrist or finger. Follow your health care provider's instructions for how to take your blood pressure. To use this form:  Get one reading in the morning (a.m.) before you take any medicines.  Get one reading in the evening (p.m.) before supper.  Take at least 2 readings with each blood pressure check. This makes sure the results are correct. Wait 1-2 minutes between measurements.  Write down the results in the spaces on this form.  Repeat this once a week, or as told by your health care provider.  Make a follow-up appointment with your health care provider to discuss the results. Blood pressure log Date: _______________________  a.m. _____________________(1st reading) _____________________(2nd reading)  p.m. _____________________(1st reading) _____________________(2nd reading) Date: _______________________  a.m. _____________________(1st reading) _____________________(2nd reading)  p.m. _____________________(1st reading) _____________________(2nd reading) Date: _______________________  a.m. _____________________(1st reading) _____________________(2nd reading)  p.m. _____________________(1st reading) _____________________(2nd reading) Date: _______________________  a.m. _____________________(1st reading) _____________________(2nd reading)  p.m. _____________________(1st reading)  _____________________(2nd reading) Date: _______________________  a.m. _____________________(1st reading) _____________________(2nd reading)  p.m. _____________________(1st reading) _____________________(2nd reading) This information is not intended to replace advice given to you by your health care provider. Make sure you discuss any questions you have with your health care provider. Document Revised: 12/08/2019 Document Reviewed: 12/08/2019 Elsevier Patient Education  2021 [employer].

## 2020-11-07 ENCOUNTER — Telehealth: Payer: PPO | Admitting: Family Medicine

## 2020-11-27 DIAGNOSIS — E782 Mixed hyperlipidemia: Secondary | ICD-10-CM | POA: Diagnosis not present

## 2020-11-27 DIAGNOSIS — I10 Essential (primary) hypertension: Secondary | ICD-10-CM | POA: Diagnosis not present

## 2020-11-28 ENCOUNTER — Other Ambulatory Visit
Admission: RE | Admit: 2020-11-28 | Discharge: 2020-11-28 | Disposition: A | Discharge status: Home / Self Care | Payer: PPO | Source: Ambulatory Visit | Attending: Orthopedic Surgery | Admitting: Orthopedic Surgery

## 2020-11-28 ENCOUNTER — Other Ambulatory Visit: Payer: Self-pay

## 2020-11-28 ENCOUNTER — Other Ambulatory Visit: Payer: Self-pay | Admitting: Orthopedic Surgery

## 2020-11-28 ENCOUNTER — Other Ambulatory Visit: Payer: PPO

## 2020-11-28 ENCOUNTER — Encounter: Payer: Self-pay | Admitting: Orthopedic Surgery

## 2020-11-28 ENCOUNTER — Telehealth: Payer: Self-pay

## 2020-11-28 DIAGNOSIS — M1611 Unilateral primary osteoarthritis, right hip: Secondary | ICD-10-CM | POA: Diagnosis not present

## 2020-11-28 DIAGNOSIS — Z01810 Encounter for preprocedural cardiovascular examination: Secondary | ICD-10-CM | POA: Diagnosis not present

## 2020-11-28 DIAGNOSIS — Z01818 Encounter for other preprocedural examination: Secondary | ICD-10-CM | POA: Diagnosis not present

## 2020-11-28 DIAGNOSIS — E782 Mixed hyperlipidemia: Secondary | ICD-10-CM

## 2020-11-28 HISTORY — DX: Gastro-esophageal reflux disease without esophagitis: K21.9

## 2020-11-28 LAB — URINALYSIS, ROUTINE W REFLEX MICROSCOPIC
Bilirubin Urine: NEGATIVE
Glucose, UA: NEGATIVE mg/dL
Hgb urine dipstick: NEGATIVE
Ketones, ur: NEGATIVE mg/dL
Leukocytes,Ua: NEGATIVE
Nitrite: NEGATIVE
Protein, ur: NEGATIVE mg/dL
Specific Gravity, Urine: 1.008 (ref 1.005–1.030)
pH: 7 (ref 5.0–8.0)

## 2020-11-28 LAB — CBC
HCT: 41.9 % (ref 36.0–46.0)
Hemoglobin: 14.1 g/dL (ref 12.0–15.0)
MCH: 31.5 pg (ref 26.0–34.0)
MCHC: 33.7 g/dL (ref 30.0–36.0)
MCV: 93.7 fL (ref 80.0–100.0)
Platelets: 286 10*3/uL (ref 150–400)
RBC: 4.47 MIL/uL (ref 3.87–5.11)
RDW: 12.6 % (ref 11.5–15.5)
WBC: 5.2 10*3/uL (ref 4.0–10.5)
nRBC: 0 % (ref 0.0–0.2)

## 2020-11-28 LAB — TYPE AND SCREEN
ABO/RH(D): A POS
Antibody Screen: NEGATIVE

## 2020-11-28 LAB — COMPREHENSIVE METABOLIC PANEL
ALT: 21 IU/L (ref 0–32)
AST: 23 IU/L (ref 0–40)
Albumin/Globulin Ratio: 1.9 (ref 1.2–2.2)
Albumin: 4.7 g/dL (ref 3.7–4.7)
Alkaline Phosphatase: 60 IU/L (ref 44–121)
BUN/Creatinine Ratio: 17 (ref 12–28)
BUN: 10 mg/dL (ref 8–27)
Bilirubin Total: 0.3 mg/dL (ref 0.0–1.2)
CO2: 25 mmol/L (ref 20–29)
Calcium: 9.8 mg/dL (ref 8.7–10.3)
Chloride: 97 mmol/L (ref 96–106)
Creatinine, Ser: 0.58 mg/dL (ref 0.57–1.00)
Globulin, Total: 2.5 g/dL (ref 1.5–4.5)
Glucose: 90 mg/dL (ref 65–99)
Potassium: 4.6 mmol/L (ref 3.5–5.2)
Sodium: 136 mmol/L (ref 134–144)
Total Protein: 7.2 g/dL (ref 6.0–8.5)
eGFR: 94 mL/min/{1.73_m2} (ref >59)

## 2020-11-28 LAB — LIPID PANEL
Chol/HDL Ratio: 3.3 ratio (ref 0.0–4.4)
Cholesterol, Total: 236 mg/dL — ABNORMAL HIGH (ref 100–199)
HDL: 72 mg/dL (ref >39)
LDL Chol Calc (NIH): 136 mg/dL — ABNORMAL HIGH (ref 0–99)
Triglycerides: 158 mg/dL — ABNORMAL HIGH (ref 0–149)
VLDL Cholesterol Cal: 28 mg/dL (ref 5–40)

## 2020-11-28 LAB — SURGICAL PCR SCREEN
MRSA, PCR: NEGATIVE
Staphylococcus aureus: POSITIVE — AB

## 2020-11-28 LAB — APTT: aPTT: 33 seconds (ref 24–36)

## 2020-11-28 LAB — PROTIME-INR
INR: 1 (ref 0.8–1.2)
Prothrombin Time: 12.4 seconds (ref 11.4–15.2)

## 2020-11-28 MED ORDER — COQ-10 100 MG PO CPCR: 100.0000 mg | ORAL_CAPSULE | Freq: Every day | ORAL | 3 refills | Status: DC

## 2020-11-28 MED ORDER — ROSUVASTATIN CALCIUM 5 MG PO TABS: 5.0000 mg | ORAL_TABLET | Freq: Every day | ORAL | 3 refills | Status: DC

## 2020-11-28 NOTE — Telephone Encounter (Signed)
-----   Message from Virginia Crews, MD sent at 11/28/2020  1:07 PM EDT ----- Normal labs, except for increase in cholesterol over the last year.  The 10-year ASCVD (heart disease and stroke) risk score Sarah Bussing DC Jr., et al., 2013) is: 30.9%, which is high.  Would recommend Crestor 5mg  daily to reduce this risk. Take with CoQ10 100mg  daily to avoid myalgias.  Ok to e-Rx if patient agrees. Also, recommend diet low in saturated fat and regular exercise - 30 min at least 5 times per week.

## 2020-11-28 NOTE — H&P (Signed)
NAME: Sarah Todd MRN:   [id number] DOB:   June 28, 1946     HISTORY AND PHYSICAL  CHIEF COMPLAINT:  Right hip pain  HISTORY:   Sarah Jewel Bordeauxis a 75 y.o. female  with right  Hip Pain Patient complains of right hip pain. Onset of the symptoms was several months ago. Inciting event: none. The patient reports the hip pain is worse with weight bearing. Associated symptoms: none, injury. Aggravating symptoms include: any weight bearing and going up and down stairs. Patient has had no prior hip problems. Previous visits for this problem: multiple, this is a longstanding diagnosis. Last seen several weeks ago by Dr. Harlow Mares. Evaluation to date: plain films, which were abnormal  osteoarthritis. Treatment to date: OTC analgesics, which have been somewhat effective, prescription analgesics, which have been somewhat effective, home exercise program, which has been somewhat effective and physical therapy, which has been somewhat effective.  Plan for right total hip arthroplasty.  PAST MEDICAL HISTORY:   Past Medical History:  Diagnosis Date  . Allergy   . Arthritis    hands  . Family history of adverse reaction to anesthesia    sister but not sure of what happened  . GERD (gastroesophageal reflux disease)   . Hypertension   . Osteoporosis   . Varicose veins    Mild    PAST SURGICAL HISTORY:   Past Surgical History:  Procedure Laterality Date  . ABDOMINAL HYSTERECTOMY    . ANTERIOR CERVICAL DECOMP/DISCECTOMY FUSION N/A 07/21/2019   Procedure: ANTERIOR CERVICAL DECOMPRESSION/DISCECTOMY FUSION CERVICAL THREE- CERVICAL FOUR, CERVICAL FOUR- CERVICAL FIVE, CERVICAL FIVE- CERVICAL SIX;  Surgeon: Jovita Gamma, MD;  Location: [place];  Service: Neurosurgery;  Laterality: N/A;  ANTERIOR CERVICAL DECOMPRESSION/DISCECTOMY FUSION CERVICAL 3- CERVICAL 4, CERVICAL 4- CERVICAL 5, CERVICAL 5- CERVICAL 6  . BACK SURGERY     cervical and lumbar    . COLONOSCOPY N/A 01/05/2015   Procedure: COLONOSCOPY;   Surgeon: Lucilla Lame, MD;  Location: [place];  Service: Gastroenterology;  Laterality: N/A;  . COLONOSCOPY WITH PROPOFOL N/A 01/25/2020   Procedure: COLONOSCOPY WITH PROPOFOL;  Surgeon: Lucilla Lame, MD;  Location[facility] ENDOSCOPY;  Service: Endoscopy;  Laterality: N/A;  . DILATION AND CURETTAGE OF UTERUS    . FRACTURE SURGERY     clavicle right  . TOOTH EXTRACTION    . TUBAL LIGATION      MEDICATIONS:  (Not in a hospital admission)   ALLERGIES:   Allergies  Allergen Reactions  . Lisinopril Anaphylaxis  . Shrimp [Shellfish Allergy] Anaphylaxis  . Aspercreme [Trolamine Salicylate] Rash  . Lidocaine Rash    Patches broke her skin out    REVIEW OF SYSTEMS:   Negative except HPI  FAMILY HISTORY:   Family History  Problem Relation Age of Onset  . Cancer Mother        colon cancer  . Alzheimer's disease Father   . Heart disease Father   . Hypertension Father   . Cancer Sister        uterine  . Breast cancer Neg Hx     SOCIAL HISTORY:   reports that she has never smoked. She has never used smokeless tobacco. She reports that she does not drink alcohol and does not use drugs.  PHYSICAL EXAM:  General appearance: alert, cooperative and no distress Neck: no JVD and supple, symmetrical, trachea midline Resp: clear to auscultation bilaterally Cardio: regular rate and rhythm, S1, S2 normal, no murmur, click, rub or gallop GI: soft, non-tender; bowel  sounds normal; no masses,  no organomegaly Extremities: extremities normal, atraumatic, no cyanosis or edema and Homans sign is negative, no sign of DVT Pulses: 2+ and symmetric Skin: Skin color, texture, turgor normal. No rashes or lesions Neurologic: Alert and oriented X 3, normal strength and tone. Normal symmetric reflexes. Normal coordination and gait    LABORATORY STUDIES: No results for input(s): WBC, HGB, HCT, PLT in the last 72 hours.   Recent Labs    11/27/20 0953  NA 136  K 4.6  CL 97  CO2 25  GLUCOSE  90  BUN 10  CREATININE 0.58  CALCIUM 9.8    STUDIES/RESULTS:  No results found.  ASSESSMENT:  End stage osteoarthritis right hip        Active Problems:   * No active hospital problems. *    PLAN:  Right Primary Total Hip   Carlynn Spry 11/28/2020. 2:38 PM

## 2020-11-28 NOTE — Patient Instructions (Addendum)
Your procedure is scheduled on: Wed. 4/6 Report to Registration Desk the to Same Day Surgery To find out your arrival time please call [phone] between 1PM - 3PM on Tues 4/5  Remember: Instructions that are not followed completely may result in serious medical risk,  up to and including death, or upon the discretion of your surgeon and anesthesiologist your  surgery may need to be rescheduled.     _X__ 1. Do not eat food after midnight the night before your procedure.                 No chewing gum or hard candies. You may drink clear liquids up to 2 hours                 before you are scheduled to arrive for your surgery- DO not drink clear                 liquids within 2 hours of the start of your surgery.                 Clear Liquids include:  water, apple juice without pulp, clear Gatorade, G2 or                  Gatorade Zero (avoid Red/Purple/Blue), Black Coffee or Tea (Do not add                 anything to coffee or tea). _____2.   Complete the "Ensure Clear Pre-surgery Clear Carbohydrate Drink" provided to you, 2 hours before arrival. **If you       are diabetic you will be provided with an alternative drink, Gatorade Zero or G2.  __X__2.  On the morning of surgery brush your teeth with toothpaste and water, you                may rinse your mouth with mouthwash if you wish.  Do not swallow any toothpaste of mouthwash.     ___ 3.  No Alcohol for 24 hours before or after surgery.   __ 4.  Do Not Smoke or use e-cigarettes For 24 Hours Prior to Your Surgery.                 Do not use any chewable tobacco products for at least 6 hours prior to                 Surgery.  ___  5.  Do not use any recreational drugs (marijuana, cocaine, heroin, ecstasy, MDMA or other)                For at least one week prior to your surgery.  Combination of these drugs with anesthesia                May have life threatening results.  ____  6.  Bring all medications  with you on the day of surgery if instructed.   __x__  7.  Notify your doctor if there is any change in your medical condition      (cold, fever, infections).     Do not wear jewelry, make-up, hairpins, clips or nail polish. Do not wear lotions, powders, or perfumes. You may wear deodorant. Do not shave 48 hours prior to surgery. Do not bring valuables to the hospital.    [facility] is not responsible for any belongings or valuables.  Contacts, dentures or bridgework may not be worn into surgery. Leave your suitcase  in the car. After surgery it may be brought to your room. For patients admitted to the hospital, discharge time is determined by your treatment team.   Patients discharged the day of surgery will not be allowed to drive home.   Make arrangements for someone to be with you for the first 24 hours of your Same Day Discharge.    Please read over the following fact sheets that you were given:     _x___ Take these medicines the morning of surgery with A SIP OF WATER:    1.gabapentin (NEURONTIN) 600 MG tablet  2.acetaminophen (TYLENOL) 650 MG CR tablet if needed   3. estradiol (ESTRACE) 1 MG tablet  4.  5.  6.  ____ Fleet Enema (as directed)   __x__ Use CHG Soap (or wipes) as directed  ____ Use Benzoyl Peroxide Gel as instructed  ____ Use inhalers on the day of surgery  ____ Stop metformin 2 days prior to surgery    ____ Take 1/2 of usual insulin dose the night before surgery. No insulin the morning          of surgery.   __x__ Stop aspirin on tomorrow  __x__ Stop Anti-inflammatories on Meloxicam  tomorrow   __x__ Stop supplements until after surgery.  Omega-3 Fatty Acids (FISH OIL) 1000 MG CAPS,vitamin C (ASCORBIC ACID) 500 MG tablet tomorrow   ____ Bring C-Pap to the hospital.    If you have any questions regarding your pre-procedure instructions,  Please call Pre-admit Testing at [phone]

## 2020-11-30 DIAGNOSIS — M47812 Spondylosis without myelopathy or radiculopathy, cervical region: Secondary | ICD-10-CM | POA: Diagnosis not present

## 2020-12-01 ENCOUNTER — Encounter: Payer: Self-pay | Admitting: Physician Assistant

## 2020-12-01 ENCOUNTER — Ambulatory Visit (INDEPENDENT_AMBULATORY_CARE_PROVIDER_SITE_OTHER): Payer: PPO | Admitting: Physician Assistant

## 2020-12-01 ENCOUNTER — Other Ambulatory Visit: Payer: Self-pay

## 2020-12-01 VITALS — BP 150/84 | Temp 97.6°F | Ht 62.0 in | Wt 150.0 lb

## 2020-12-01 DIAGNOSIS — I10 Essential (primary) hypertension: Secondary | ICD-10-CM | POA: Diagnosis not present

## 2020-12-01 DIAGNOSIS — M1611 Unilateral primary osteoarthritis, right hip: Secondary | ICD-10-CM | POA: Diagnosis not present

## 2020-12-01 NOTE — Progress Notes (Signed)
Established patient visit   Patient: Sarah Todd   DOB: 23-Oct-1945   75 y.o. Female  MRN: [id number] Visit Date: 12/01/2020  Today's healthcare provider: Mar Daring, PA-C   No chief complaint on file.  Subjective    HPI  Sarah Todd is a 75 yr old female that is here today for follow up of chronic issues and to see provider before she leaves.   She reports she is doing well and has no complaints.   Patient Active Problem List   Diagnosis Date Noted  . Status post total hip replacement, right 12/06/2020  . Urge incontinence of urine 11/06/2020  . Personal history of colonic polyps   . HNP (herniated nucleus pulposus), cervical 07/21/2019  . Lumbar radiculopathy 06/21/2019  . Family history of colon cancer in mother 12/18/2017  . Pain in elbow 11/07/2016  . Candidiasis of breast 07/20/2015  . Atrophic vaginitis 07/20/2015  . Constipation 06/20/2015  . Skin lesion of face 06/20/2015  . Tinea corporis 05/19/2015  . Allergic rhinitis 02/06/2015  . Arthritis 02/06/2015  . BP (high blood pressure) 02/06/2015  . OP (osteoporosis) 02/06/2015  . Shoulder strain 02/06/2015  . Dermatophytosis of groin 02/06/2015  . Candida vaginitis 02/06/2015   Past Medical History:  Diagnosis Date  . Allergy   . Arthritis    hands  . Family history of adverse reaction to anesthesia    sister but not sure of what happened  . GERD (gastroesophageal reflux disease)   . Hypertension   . Osteoporosis   . Varicose veins    Mild   Past Surgical History:  Procedure Laterality Date  . ABDOMINAL HYSTERECTOMY    . ANTERIOR CERVICAL DECOMP/DISCECTOMY FUSION N/A 07/21/2019   Procedure: ANTERIOR CERVICAL DECOMPRESSION/DISCECTOMY FUSION CERVICAL THREE- CERVICAL FOUR, CERVICAL FOUR- CERVICAL FIVE, CERVICAL FIVE- CERVICAL SIX;  Surgeon: Jovita Gamma, MD;  Location: [facility];  Service: Neurosurgery;  Laterality: N/A;  ANTERIOR CERVICAL DECOMPRESSION/DISCECTOMY FUSION CERVICAL  3- CERVICAL 4, CERVICAL 4- CERVICAL 5, CERVICAL 5- CERVICAL 6  . BACK SURGERY     cervical and lumbar    . COLONOSCOPY N/A 01/05/2015   Procedure: COLONOSCOPY;  Surgeon: Lucilla Lame, MD;  Location: [facility];  Service: Gastroenterology;  Laterality: N/A;  . COLONOSCOPY WITH PROPOFOL N/A 01/25/2020   Procedure: COLONOSCOPY WITH PROPOFOL;  Surgeon: Lucilla Lame, MD;  Location: [facility];  Service: Endoscopy;  Laterality: N/A;  . DILATION AND CURETTAGE OF UTERUS    . FRACTURE SURGERY     clavicle right  . TOOTH EXTRACTION    . TOTAL HIP ARTHROPLASTY Right 12/06/2020   Procedure: TOTAL HIP ARTHROPLASTY ANTERIOR APPROACH;  Surgeon: Lovell Sheehan, MD;  Location: [facility];  Service: Orthopedics;  Laterality: Right;  . TUBAL LIGATION         Medications: Outpatient Medications Prior to Visit  Medication Sig  . acetaminophen (TYLENOL) 650 MG CR tablet Take 2,600 mg by mouth daily as needed for pain.  Marland Kitchen aspirin 81 MG tablet Take 81 mg by mouth daily.   . Calcium Carbonate-Vitamin D (CALCIUM 600+D PO) Take 1 capsule by mouth daily.  . Cholecalciferol (VITAMIN D) 50 MCG (2000 UT) tablet Take 2,000 Units by mouth daily.  . Coenzyme Q10 (COQ-10) 100 MG capsule Take 1 capsule (100 mg total) by mouth daily.  . diphenhydrAMINE HCl, Sleep, (ZZZQUIL) 25 MG CAPS Take 50 mg by mouth at bedtime.  Marland Kitchen estradiol (ESTRACE) 1 MG tablet Take 0.5 mg by mouth  daily.  . gabapentin (NEURONTIN) 600 MG tablet Take 600-900 mg by mouth See admin instructions. Take 600 mg by mouth in the morning and 900 mg at night  . hydrocortisone 2.5 % cream Apply topically 2 (two) times daily. (Patient taking differently: Apply 1 application topically 2 (two) times daily as needed (irritation).)  . losartan (COZAAR) 50 MG tablet Take 1 tablet (50 mg total) by mouth every morning.  . meloxicam (MOBIC) 15 MG tablet Take 15 mg by mouth at bedtime.  . Multiple Vitamin (MULTIVITAMIN) capsule Take 1 capsule by mouth daily.   Marland Kitchen  neomycin-polymyxin b-dexamethasone (MAXITROL) 3.5-10000-0.1 OINT Place 1 application into both eyes 4 (four) times daily as needed (eye infections).  . Omega-3 Fatty Acids (FISH OIL) 1000 MG CAPS Take 1,000-2,000 mg by mouth See admin instructions. Take 1000 mg in the morning and 2000 mg at night  . Probiotic Product (PROBIOTIC PO) Take 1 capsule by mouth daily.  . rosuvastatin (CRESTOR) 5 MG tablet Take 1 tablet (5 mg total) by mouth daily.  Marland Kitchen triamcinolone (KENALOG) 0.1 % Apply 1 application topically 2 (two) times daily. (Patient taking differently: Apply 1 application topically 2 (two) times daily as needed (rash).)  . vitamin C (ASCORBIC ACID) 500 MG tablet Take 500 mg by mouth daily.    No facility-administered medications prior to visit.    Review of Systems  Constitutional: Negative.   Respiratory: Negative.   Cardiovascular: Negative.   Neurological: Negative.     Last CBC Lab Results  Component Value Date   WBC 12.1 (H) 12/07/2020   HGB 8.9 (L) 12/07/2020   HCT 26.0 (L) 12/07/2020   MCV 92.9 12/07/2020   MCH 31.8 12/07/2020   RDW 12.2 12/07/2020   PLT 207 36/64/4034   Last metabolic panel Lab Results  Component Value Date   GLUCOSE 137 (H) 12/07/2020   NA 130 (L) 12/07/2020   K 4.4 12/07/2020   CL 98 12/07/2020   CO2 25 12/07/2020   BUN 20 12/07/2020   CREATININE 0.68 12/07/2020   GFRNONAA >60 12/07/2020   GFRAA 101 12/20/2019   CALCIUM 8.5 (L) 12/07/2020   PROT 7.2 11/27/2020   ALBUMIN 4.7 11/27/2020   LABGLOB 2.5 11/27/2020   AGRATIO 1.9 11/27/2020   BILITOT 0.3 11/27/2020   ALKPHOS 60 11/27/2020   AST 23 11/27/2020   ALT 21 11/27/2020   ANIONGAP 7 12/07/2020       Objective    There were no vitals taken for this visit. BP Readings from Last 3 Encounters:  12/07/20 124/63  12/01/20 (!) 150/84  11/28/20 (!) 149/90   Wt Readings from Last 3 Encounters:  12/06/20 149 lb 14.6 oz (68 kg)  12/01/20 150 lb (68 kg)  11/28/20 149 lb 9.6 oz (67.9 kg)        Physical Exam Vitals reviewed.  Constitutional:      General: She is not in acute distress.    Appearance: Normal appearance. She is well-developed. She is not ill-appearing.  HENT:     Head: Normocephalic and atraumatic.  Pulmonary:     Effort: Pulmonary effort is normal. No respiratory distress.  Musculoskeletal:     Cervical back: Normal range of motion and neck supple.  Neurological:     Mental Status: She is alert.  Psychiatric:        Mood and Affect: Mood normal.        Behavior: Behavior normal.        Thought Content: Thought content normal.  Judgment: Judgment normal.       No results found for any visits on 12/01/20.  Assessment & Plan     1. Primary hypertension Stable. Continue current medical treatment plan.   No follow-ups on file.      Reynolds Bowl, PA-C, have reviewed all documentation for this visit. The documentation on 12/10/20 for the exam, diagnosis, procedures, and orders are all accurate and complete.   Rubye Beach  [facility] [phone] (phone) [phone] (fax)  [place]

## 2020-12-04 ENCOUNTER — Other Ambulatory Visit
Admission: RE | Admit: 2020-12-04 | Discharge: 2020-12-04 | Disposition: A | Discharge status: Home / Self Care | Payer: PPO | Source: Ambulatory Visit | Attending: Orthopedic Surgery | Admitting: Orthopedic Surgery

## 2020-12-04 ENCOUNTER — Other Ambulatory Visit: Payer: Self-pay

## 2020-12-04 DIAGNOSIS — Z20822 Contact with and (suspected) exposure to covid-19: Secondary | ICD-10-CM | POA: Insufficient documentation

## 2020-12-04 DIAGNOSIS — Z01812 Encounter for preprocedural laboratory examination: Secondary | ICD-10-CM | POA: Diagnosis not present

## 2020-12-04 LAB — SARS CORONAVIRUS 2 (TAT 6-24 HRS): SARS Coronavirus 2: NEGATIVE

## 2020-12-06 ENCOUNTER — Ambulatory Visit: Payer: PPO | Admitting: Certified Registered Nurse Anesthetist

## 2020-12-06 ENCOUNTER — Encounter
Admission: RE | Disposition: A | Discharge status: Home / Self Care | Payer: Self-pay | Source: Ambulatory Visit | Attending: Orthopedic Surgery

## 2020-12-06 ENCOUNTER — Other Ambulatory Visit: Payer: Self-pay

## 2020-12-06 ENCOUNTER — Encounter: Payer: Self-pay | Admitting: Orthopedic Surgery

## 2020-12-06 ENCOUNTER — Ambulatory Visit: Payer: PPO

## 2020-12-06 ENCOUNTER — Observation Stay
Admission: RE | Admit: 2020-12-06 | Discharge: 2020-12-07 | Disposition: A | Discharge status: Home / Self Care | Payer: PPO | Source: Ambulatory Visit | Attending: Orthopedic Surgery | Admitting: Orthopedic Surgery

## 2020-12-06 DIAGNOSIS — I10 Essential (primary) hypertension: Secondary | ICD-10-CM | POA: Insufficient documentation

## 2020-12-06 DIAGNOSIS — Z79899 Other long term (current) drug therapy: Secondary | ICD-10-CM | POA: Diagnosis not present

## 2020-12-06 DIAGNOSIS — M069 Rheumatoid arthritis, unspecified: Secondary | ICD-10-CM | POA: Insufficient documentation

## 2020-12-06 DIAGNOSIS — Z96641 Presence of right artificial hip joint: Secondary | ICD-10-CM

## 2020-12-06 DIAGNOSIS — K219 Gastro-esophageal reflux disease without esophagitis: Secondary | ICD-10-CM | POA: Diagnosis not present

## 2020-12-06 DIAGNOSIS — M1611 Unilateral primary osteoarthritis, right hip: Principal | ICD-10-CM | POA: Insufficient documentation

## 2020-12-06 DIAGNOSIS — Z471 Aftercare following joint replacement surgery: Secondary | ICD-10-CM | POA: Diagnosis not present

## 2020-12-06 DIAGNOSIS — Z419 Encounter for procedure for purposes other than remedying health state, unspecified: Secondary | ICD-10-CM

## 2020-12-06 HISTORY — PX: TOTAL HIP ARTHROPLASTY: SHX124

## 2020-12-06 IMAGING — RF DG HIP (WITH PELVIS) OPERATIVE*R*
1 series · 1 of 1 positions shown · non-contrast
Comparison: None.

CLINICAL DATA: Anterior approach right total hip replacement

EXAM:
OPERATIVE RIGHT HIP (WITH PELVIS IF PERFORMED) 1 VIEW
TECHNIQUE: Fluoroscopic spot image(s) were submitted for interpretation
post-operatively.

[Series 1: run · 1 of 1 slices shown]
[im 1/1]
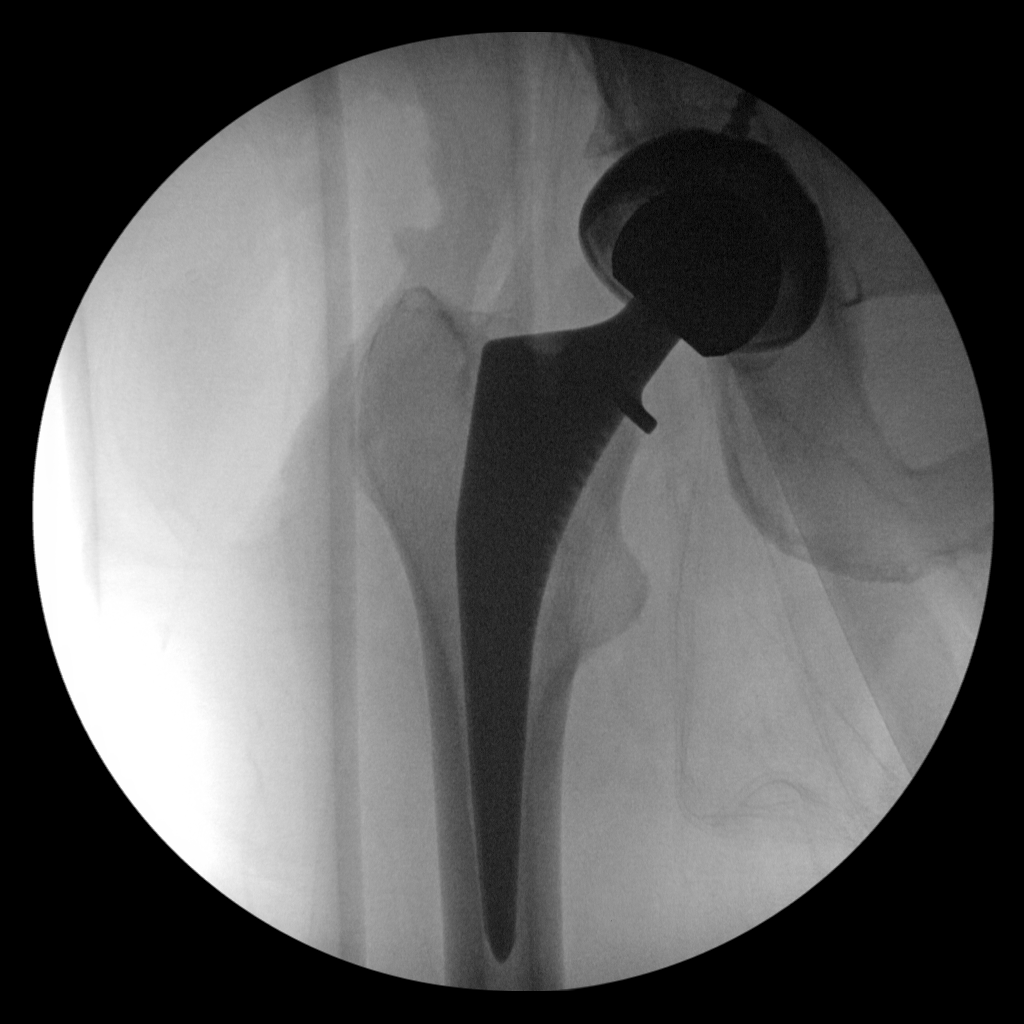

[1 of 1 positions shown; findings below may reference images not displayed]

FINDINGS: A single spot anterior projection intraoperative fluoroscopic image
is provided of the right hip demonstrating the sequela of total hip
replacement. Alignment appears anatomic given AP projection. There
is a minimal amount of expected subcutaneous emphysema about the
operative site. No radiopaque body.
IMPRESSION: Post right total hip replacement without apparent complication.

## 2020-12-06 SURGERY — ARTHROPLASTY, HIP, TOTAL, ANTERIOR APPROACH
Anesthesia: Spinal | Site: Hip | Laterality: Right

## 2020-12-06 MED ORDER — BISACODYL 10 MG RE SUPP: 10.0000 mg | Freq: Every day | RECTAL | Status: DC | PRN

## 2020-12-06 MED ORDER — KETOROLAC TROMETHAMINE 15 MG/ML IJ SOLN
7.5000 mg | Freq: Four times a day (QID) | INTRAMUSCULAR | Status: DC
  Administered 2020-12-06 – 2020-12-07 (×2): 7.5 mg via INTRAVENOUS
  Filled 2020-12-06 (×2): qty 1

## 2020-12-06 MED ORDER — ONDANSETRON HCL 4 MG PO TABS: 4.0000 mg | ORAL_TABLET | Freq: Four times a day (QID) | ORAL | Status: DC | PRN

## 2020-12-06 MED ORDER — GABAPENTIN 600 MG PO TABS: 600.0000 mg | ORAL_TABLET | ORAL | Status: DC

## 2020-12-06 MED ORDER — CEFAZOLIN SODIUM-DEXTROSE 2-4 GM/100ML-% IV SOLN
2.0000 g | INTRAVENOUS | Status: AC
  Administered 2020-12-06: 2 g via INTRAVENOUS

## 2020-12-06 MED ORDER — MENTHOL 3 MG MT LOZG
1.0000 | LOZENGE | OROMUCOSAL | Status: DC | PRN
  Filled 2020-12-06: qty 9

## 2020-12-06 MED ORDER — FAMOTIDINE 20 MG PO TABS: 20.0000 mg | ORAL_TABLET | Freq: Once | ORAL | Status: AC

## 2020-12-06 MED ORDER — ORAL CARE MOUTH RINSE: 15.0000 mL | Freq: Once | OROMUCOSAL | Status: AC

## 2020-12-06 MED ORDER — FENTANYL CITRATE (PF) 100 MCG/2ML IJ SOLN
INTRAMUSCULAR | Status: DC | PRN
  Administered 2020-12-06: 25 ug via INTRAVENOUS

## 2020-12-06 MED ORDER — METOCLOPRAMIDE HCL 10 MG PO TABS: 5.0000 mg | ORAL_TABLET | Freq: Three times a day (TID) | ORAL | Status: DC | PRN

## 2020-12-06 MED ORDER — LACTATED RINGERS IV SOLN: INTRAVENOUS | Status: DC

## 2020-12-06 MED ORDER — CHLORHEXIDINE GLUCONATE 0.12 % MT SOLN: 15.0000 mL | Freq: Once | OROMUCOSAL | Status: AC

## 2020-12-06 MED ORDER — PROPOFOL 500 MG/50ML IV EMUL
INTRAVENOUS | Status: DC | PRN
  Administered 2020-12-06: 50 ug/kg/min via INTRAVENOUS

## 2020-12-06 MED ORDER — ROSUVASTATIN CALCIUM 5 MG PO TABS
5.0000 mg | ORAL_TABLET | Freq: Every day | ORAL | Status: DC
  Filled 2020-12-06: qty 1

## 2020-12-06 MED ORDER — BUPIVACAINE-EPINEPHRINE (PF) 0.25% -1:200000 IJ SOLN
INTRAMUSCULAR | Status: DC | PRN
  Administered 2020-12-06: 20 mL

## 2020-12-06 MED ORDER — SODIUM CHLORIDE 0.9 % IV SOLN
1.0000 g | Freq: Four times a day (QID) | INTRAVENOUS | Status: AC
  Administered 2020-12-06 (×2): 1 g via INTRAVENOUS
  Filled 2020-12-06 (×2): qty 1

## 2020-12-06 MED ORDER — LOSARTAN POTASSIUM 50 MG PO TABS
50.0000 mg | ORAL_TABLET | Freq: Every morning | ORAL | Status: DC
  Filled 2020-12-06: qty 1

## 2020-12-06 MED ORDER — ALUM & MAG HYDROXIDE-SIMETH 200-200-20 MG/5ML PO SUSP: 30.0000 mL | ORAL | Status: DC | PRN

## 2020-12-06 MED ORDER — METOCLOPRAMIDE HCL 5 MG/ML IJ SOLN: 5.0000 mg | Freq: Three times a day (TID) | INTRAMUSCULAR | Status: DC | PRN

## 2020-12-06 MED ORDER — PHENYLEPHRINE HCL (PRESSORS) 10 MG/ML IV SOLN
INTRAVENOUS | Status: AC
  Filled 2020-12-06: qty 1

## 2020-12-06 MED ORDER — MAGNESIUM HYDROXIDE 400 MG/5ML PO SUSP
30.0000 mL | Freq: Every day | ORAL | Status: DC | PRN
  Administered 2020-12-07: 30 mL via ORAL
  Filled 2020-12-06: qty 30

## 2020-12-06 MED ORDER — KETOROLAC TROMETHAMINE 15 MG/ML IJ SOLN
INTRAMUSCULAR | Status: AC
  Administered 2020-12-06: 7.5 mg via INTRAVENOUS
  Filled 2020-12-06: qty 1

## 2020-12-06 MED ORDER — PHENOL 1.4 % MT LIQD
1.0000 | OROMUCOSAL | Status: DC | PRN
  Filled 2020-12-06: qty 177

## 2020-12-06 MED ORDER — TRANEXAMIC ACID-NACL 1000-0.7 MG/100ML-% IV SOLN
1000.0000 mg | INTRAVENOUS | Status: AC
  Administered 2020-12-06: 1000 mg via INTRAVENOUS

## 2020-12-06 MED ORDER — MIDAZOLAM HCL 2 MG/2ML IJ SOLN
INTRAMUSCULAR | Status: AC
  Filled 2020-12-06: qty 2

## 2020-12-06 MED ORDER — GABAPENTIN 600 MG PO TABS
600.0000 mg | ORAL_TABLET | Freq: Every day | ORAL | Status: DC
  Administered 2020-12-07: 600 mg via ORAL
  Filled 2020-12-06: qty 1

## 2020-12-06 MED ORDER — BUPIVACAINE-EPINEPHRINE (PF) 0.25% -1:200000 IJ SOLN
INTRAMUSCULAR | Status: AC
  Filled 2020-12-06: qty 60

## 2020-12-06 MED ORDER — PHENYLEPHRINE HCL (PRESSORS) 10 MG/ML IV SOLN
INTRAVENOUS | Status: DC | PRN
  Administered 2020-12-06: 50 ug via INTRAVENOUS

## 2020-12-06 MED ORDER — GABAPENTIN 300 MG PO CAPS
900.0000 mg | ORAL_CAPSULE | Freq: Every day | ORAL | Status: DC
  Administered 2020-12-06: 900 mg via ORAL
  Filled 2020-12-06: qty 3

## 2020-12-06 MED ORDER — BUPIVACAINE HCL (PF) 0.5 % IJ SOLN
INTRAMUSCULAR | Status: AC
  Filled 2020-12-06: qty 10

## 2020-12-06 MED ORDER — ONDANSETRON HCL 4 MG/2ML IJ SOLN: 4.0000 mg | Freq: Four times a day (QID) | INTRAMUSCULAR | Status: DC | PRN

## 2020-12-06 MED ORDER — FAMOTIDINE 20 MG PO TABS
ORAL_TABLET | ORAL | Status: AC
  Administered 2020-12-06: 20 mg via ORAL
  Filled 2020-12-06: qty 1

## 2020-12-06 MED ORDER — MAGNESIUM CITRATE PO SOLN
1.0000 | Freq: Once | ORAL | Status: DC | PRN
  Filled 2020-12-06: qty 296

## 2020-12-06 MED ORDER — BUPIVACAINE HCL (PF) 0.5 % IJ SOLN
INTRAMUSCULAR | Status: DC | PRN
  Administered 2020-12-06: 2.2 mL via INTRATHECAL

## 2020-12-06 MED ORDER — DOCUSATE SODIUM 100 MG PO CAPS
100.0000 mg | ORAL_CAPSULE | Freq: Two times a day (BID) | ORAL | Status: DC
  Administered 2020-12-06 – 2020-12-07 (×2): 100 mg via ORAL
  Filled 2020-12-06 (×2): qty 1

## 2020-12-06 MED ORDER — CHLORHEXIDINE GLUCONATE 0.12 % MT SOLN
OROMUCOSAL | Status: AC
  Administered 2020-12-06: 15 mL via OROMUCOSAL
  Filled 2020-12-06: qty 15

## 2020-12-06 MED ORDER — MORPHINE SULFATE (PF) 2 MG/ML IV SOLN: 0.5000 mg | INTRAVENOUS | Status: DC | PRN

## 2020-12-06 MED ORDER — METHOCARBAMOL 1000 MG/10ML IJ SOLN
500.0000 mg | Freq: Four times a day (QID) | INTRAVENOUS | Status: DC | PRN
  Filled 2020-12-06: qty 5

## 2020-12-06 MED ORDER — MIDAZOLAM HCL 5 MG/5ML IJ SOLN
INTRAMUSCULAR | Status: DC | PRN
  Administered 2020-12-06: 2 mg via INTRAVENOUS

## 2020-12-06 MED ORDER — TRANEXAMIC ACID-NACL 1000-0.7 MG/100ML-% IV SOLN
INTRAVENOUS | Status: AC
  Filled 2020-12-06: qty 100

## 2020-12-06 MED ORDER — ASPIRIN 81 MG PO CHEW
81.0000 mg | CHEWABLE_TABLET | Freq: Two times a day (BID) | ORAL | Status: DC
  Administered 2020-12-06 – 2020-12-07 (×2): 81 mg via ORAL
  Filled 2020-12-06 (×2): qty 1

## 2020-12-06 MED ORDER — SODIUM CHLORIDE 0.9 % IV SOLN
INTRAVENOUS | Status: DC | PRN
  Administered 2020-12-06: 20 ug/min via INTRAVENOUS

## 2020-12-06 MED ORDER — DIPHENHYDRAMINE HCL 12.5 MG/5ML PO ELIX: 12.5000 mg | ORAL_SOLUTION | ORAL | Status: DC | PRN

## 2020-12-06 MED ORDER — METHOCARBAMOL 500 MG PO TABS
500.0000 mg | ORAL_TABLET | Freq: Four times a day (QID) | ORAL | Status: DC | PRN
  Administered 2020-12-06 – 2020-12-07 (×2): 500 mg via ORAL
  Filled 2020-12-06 (×2): qty 1

## 2020-12-06 MED ORDER — ONDANSETRON HCL 4 MG/2ML IJ SOLN
INTRAMUSCULAR | Status: DC | PRN
  Administered 2020-12-06: 4 mg via INTRAVENOUS

## 2020-12-06 MED ORDER — HYDROCODONE-ACETAMINOPHEN 5-325 MG PO TABS
1.0000 | ORAL_TABLET | ORAL | Status: DC | PRN
  Administered 2020-12-06: 2 via ORAL
  Administered 2020-12-07 (×2): 1 via ORAL
  Filled 2020-12-06: qty 2
  Filled 2020-12-06 (×2): qty 1

## 2020-12-06 MED ORDER — CEFAZOLIN SODIUM-DEXTROSE 1-4 GM/50ML-% IV SOLN
1.0000 g | Freq: Four times a day (QID) | INTRAVENOUS | Status: DC
  Filled 2020-12-06 (×2): qty 50

## 2020-12-06 MED ORDER — DEXAMETHASONE SODIUM PHOSPHATE 10 MG/ML IJ SOLN
INTRAMUSCULAR | Status: DC | PRN
  Administered 2020-12-06: 10 mg via INTRAVENOUS

## 2020-12-06 MED ORDER — FENTANYL CITRATE (PF) 100 MCG/2ML IJ SOLN
INTRAMUSCULAR | Status: AC
  Filled 2020-12-06: qty 2

## 2020-12-06 MED ORDER — ESTRADIOL 1 MG PO TABS
0.5000 mg | ORAL_TABLET | Freq: Every day | ORAL | Status: DC
  Administered 2020-12-07: 0.5 mg via ORAL
  Filled 2020-12-06: qty 0.5

## 2020-12-06 MED ORDER — NEOMYCIN-POLYMYXIN-DEXAMETH 3.5-10000-0.1 OP OINT
1.0000 "application " | TOPICAL_OINTMENT | Freq: Four times a day (QID) | OPHTHALMIC | Status: DC | PRN
  Filled 2020-12-06: qty 3.5

## 2020-12-06 MED ORDER — CEFAZOLIN SODIUM-DEXTROSE 2-4 GM/100ML-% IV SOLN
INTRAVENOUS | Status: AC
  Filled 2020-12-06: qty 100

## 2020-12-06 MED ORDER — PROPOFOL 1000 MG/100ML IV EMUL
INTRAVENOUS | Status: AC
  Filled 2020-12-06: qty 100

## 2020-12-06 MED ORDER — HYDROCODONE-ACETAMINOPHEN 7.5-325 MG PO TABS
1.0000 | ORAL_TABLET | ORAL | Status: DC | PRN
  Administered 2020-12-06: 1 via ORAL
  Filled 2020-12-06: qty 1

## 2020-12-06 MED ORDER — ACETAMINOPHEN 325 MG PO TABS
325.0000 mg | ORAL_TABLET | Freq: Four times a day (QID) | ORAL | Status: DC | PRN
Start: 2020-12-07 — End: 2020-12-07

## 2020-12-06 SURGICAL SUPPLY — 55 items
APL PRP STRL LF DISP 70% ISPRP (MISCELLANEOUS) ×1
BLADE SAGITTAL WIDE XTHICK NO (BLADE) ×2 IMPLANT
BRUSH SCRUB EZ  4% CHG (MISCELLANEOUS) ×4
BRUSH SCRUB EZ 4% CHG (MISCELLANEOUS) ×2 IMPLANT
CHLORAPREP W/TINT 26 (MISCELLANEOUS) ×2 IMPLANT
COVER HOLE (Hips) ×1 IMPLANT
COVER WAND RF STERILE (DRAPES) ×2 IMPLANT
DRAPE 3/4 80X56 (DRAPES) ×2 IMPLANT
DRAPE C-ARM 42X72 X-RAY (DRAPES) ×2 IMPLANT
DRAPE STERI IOBAN 125X83 (DRAPES) IMPLANT
DRSG AQUACEL AG ADV 3.5X10 (GAUZE/BANDAGES/DRESSINGS) IMPLANT
DRSG AQUACEL AG ADV 3.5X14 (GAUZE/BANDAGES/DRESSINGS) IMPLANT
ELECT BLADE 4.0 EZ CLEAN MEGAD (MISCELLANEOUS) ×2
ELECT BLADE 6.5 EXT (BLADE) ×2 IMPLANT
ELECT REM PT RETURN 9FT ADLT (ELECTROSURGICAL) ×2
ELECTRODE BLDE 4.0 EZ CLN MEGD (MISCELLANEOUS) IMPLANT
ELECTRODE REM PT RTRN 9FT ADLT (ELECTROSURGICAL) ×1 IMPLANT
GAUZE XEROFORM 1X8 LF (GAUZE/BANDAGES/DRESSINGS) IMPLANT
GLOVE SURG ORTHO LTX SZ8 (GLOVE) ×4 IMPLANT
GLOVE SURG UNDER LTX SZ8 (GLOVE) ×2 IMPLANT
GOWN STRL REUS W/ TWL LRG LVL3 (GOWN DISPOSABLE) ×1 IMPLANT
GOWN STRL REUS W/ TWL XL LVL3 (GOWN DISPOSABLE) ×1 IMPLANT
GOWN STRL REUS W/TWL LRG LVL3 (GOWN DISPOSABLE) ×2
GOWN STRL REUS W/TWL XL LVL3 (GOWN DISPOSABLE) ×2
HEAD FEMORAL SZ21 -3 OXINIUM (Head) ×1 IMPLANT
HOOD PEEL AWAY FLYTE STAYCOOL (MISCELLANEOUS) ×6 IMPLANT
IRRIGATION SURGIPHOR STRL (IV SOLUTION) IMPLANT
IV NS 1000ML (IV SOLUTION) ×2
IV NS 1000ML BAXH (IV SOLUTION) ×1 IMPLANT
KIT PATIENT CARE HANA TABLE (KITS) ×2 IMPLANT
KIT TURNOVER CYSTO (KITS) ×2 IMPLANT
LINER 3H HEMI SHELL 48MM (Liner) ×1 IMPLANT
LINER ACETABULAR 32X48 (Liner) ×1 IMPLANT
MANIFOLD NEPTUNE II (INSTRUMENTS) ×2 IMPLANT
MAT ABSORB  FLUID 56X50 GRAY (MISCELLANEOUS) ×2
MAT ABSORB FLUID 56X50 GRAY (MISCELLANEOUS) ×1 IMPLANT
NDL SAFETY ECLIPSE 18X1.5 (NEEDLE) ×2 IMPLANT
NDL SPNL 20GX3.5 QUINCKE YW (NEEDLE) ×1 IMPLANT
NEEDLE HYPO 18GX1.5 SHARP (NEEDLE) ×4
NEEDLE HYPO 22GX1.5 SAFETY (NEEDLE) ×2 IMPLANT
NEEDLE SPNL 20GX3.5 QUINCKE YW (NEEDLE) ×2 IMPLANT
PACK HIP PROSTHESIS (MISCELLANEOUS) ×2 IMPLANT
PADDING CAST BLEND 4X4 NS (MISCELLANEOUS) ×4 IMPLANT
PILLOW ABDUCTION MEDIUM (MISCELLANEOUS) ×2 IMPLANT
PULSAVAC PLUS IRRIG FAN TIP (DISPOSABLE) ×2
SCREW DOME HOLE 6.5 15 (Screw) ×1 IMPLANT
STAPLER SKIN PROX 35W (STAPLE) ×2 IMPLANT
STEM STD COLLAR SZ1 POLARSTEM (Stem) ×1 IMPLANT
SUT BONE WAX W31G (SUTURE) ×2 IMPLANT
SUT DVC 2 QUILL PDO  T11 36X36 (SUTURE) ×2
SUT DVC 2 QUILL PDO T11 36X36 (SUTURE) ×1 IMPLANT
SUT VIC AB 2-0 CT1 18 (SUTURE) ×2 IMPLANT
SYR 20ML LL LF (SYRINGE) ×2 IMPLANT
TIP FAN IRRIG PULSAVAC PLUS (DISPOSABLE) ×1 IMPLANT
WAND WEREWOLF FASTSEAL 6.0 (MISCELLANEOUS) ×1 IMPLANT

## 2020-12-06 NOTE — Op Note (Signed)
12/06/2020  10:18 AM  PATIENT:  Sarah Todd   MRN: [id number]  PRE-OPERATIVE DIAGNOSIS:  Osteoarthritis right hip   POST-OPERATIVE DIAGNOSIS: Same  Procedure: Right Total Hip Replacement  Surgeon: Elyn Aquas. Harlow Mares, MD   Assist: Carlynn Spry, PA-C  Anesthesia: Spinal   EBL: 100 mL   Specimens: None   Drains: None   Components used: A size 1 Polarstem [employer]hew, R3 size 48 mm shell, and a 36 mm -3 mm head    Description of the procedure in detail: After informed consent was obtained and the appropriate extremity marked in the pre-operative holding area, the patient was taken to the operating room and placed in the supine position on the fracture table. All pressure points were well padded and bilateral lower extremities were place in traction spars. The hip was prepped and draped in standard sterile fashion. A spinal anesthetic had been delivered by the anesthesia team. The skin and subcutaneous tissues were injected with a mixture of Marcaine with epinephrine for post-operative pain. A longitudinal incision approximately 10 cm in length was carried out from the anterior superior iliac spine to the greater trochanter. The tensor fascia was divided and blunt dissection was taken down to the level of the joint capsule. The lateral circumflex vessels were cauterized. Deep retractors were placed and a portion of the anterior capsule was excised. Using fluoroscopy the neck cut was planned and carried out with a sagittal saw. The head was passed from the field with use of a corkscrew and hip skid. Deep retractors were placed along the acetabulum and the degenerative labrum and large osteophytes were removed with a Rongeur. The cup was sequentially reamed to a size 48 mm. The wound was irrigated and using fluoroscopy the size 48 mm cup was impacted in to anatomic position. A single screw was placed followed by a threaded hole cover. The final liner was impacted in to position.  Attention was then turned to the proximal femur. The leg was placed in extension and external rotation. The canal was opened and sequentially broached to a size 1. The trial components were placed and the hip relocated. The components were found to be in good position using fluoroscopy. The hip was dislocated and the trial components removed. The final components were impacted in to position and the hip relocated. The final components were again check with fluoroscopy and found to be in good position. Hemostasis was achieved with electrocautery. The deep capsule was injected with Marcaine and epinephrine. The wound was irrigated with bacitracin laced normal saline and the tensor fascia closed with #2 Quill suture. The subcutaneous tissues were closed with 2-0 vicryl and staples for the skin. A sterile dressing was applied and an abduction pillow. Patient tolerated the procedure well and there were no apparent complication. Patient was taken to the recovery room in good condition.   Kurtis Bushman, MD

## 2020-12-06 NOTE — H&P (Signed)
The patient has been re-examined, and the chart reviewed, and there have been no interval changes to the documented history and physical.  Plan a right total hip today.  Anesthesia is not consulted regarding a peripheral nerve block for post-operative pain.  The risks, benefits, and alternatives have been discussed at length, and the patient is willing to proceed.    

## 2020-12-06 NOTE — Plan of Care (Signed)

## 2020-12-06 NOTE — Evaluation (Signed)
Physical Therapy Evaluation Patient Details Name: Sarah Todd MRN: [id number] DOB: 1945-10-14 Today's Date: 12/06/2020   History of Present Illness  Pt admitted for R THR. History includes GERD and HTN.  Clinical Impression  Pt is a pleasant 75 year old female who was admitted for R THR. Pt performs bed mobility, transfers, and ambulation with min assist. Pt experienced dizziness symptoms with seated position, taking increased time for recovery. No dizziness with standing. Once seated in recliner, left with all needs met. Very anxious to start PT, eager to go home on POD 1. Pt demonstrates deficits with strength/pain/mobility. Would benefit from skilled PT to address above deficits and promote optimal return to PLOF. Recommend transition to [place] upon discharge from acute hospitalization.     Follow Up Recommendations Home health PT;Supervision for mobility/OOB    Equipment Recommendations  Rolling walker with 5" wheels;3in1 (PT)    Recommendations for Other Services       Precautions / Restrictions Precautions Precautions: Fall;Anterior Hip Precaution Booklet Issued: No Restrictions Weight Bearing Restrictions: Yes RLE Weight Bearing: Weight bearing as tolerated      Mobility  Bed Mobility Overal bed mobility: Needs Assistance Bed Mobility: Supine to Sit     Supine to sit: Min assist     General bed mobility comments: needs assist for R LE. Frequent rest breaks due to anxiety and pain. Once seated, tends to lateral lean to R with min assist for upright posture. Complains of dizziness with seated position taking extended time for recovery    Transfers Overall transfer level: Needs assistance Equipment used: Rolling walker (2 wheeled) Transfers: Sit to/from Stand Sit to Stand: Min assist         General transfer comment: very hesitant to stand with cues for correct hand placement. Once standing, upright posture noted  Ambulation/Gait Ambulation/Gait  assistance: Min assist Gait Distance (Feet): 5 Feet Assistive device: Rolling walker (2 wheeled) Gait Pattern/deviations: Step-to pattern     General Gait Details: anxious and fatigue limited further distance. Slow step to gait pattern performed  Stairs            Wheelchair Mobility    Modified Rankin (Stroke Patients Only)       Balance Overall balance assessment: Needs assistance Sitting-balance support: No upper extremity supported;Feet supported Sitting balance-Leahy Scale: Fair Sitting balance - Comments: R lateral leaning   Standing balance support: Bilateral upper extremity supported Standing balance-Leahy Scale: Good                               Pertinent Vitals/Pain Pain Assessment: 0-10 Pain Score: 5  Pain Location: R hip Pain Descriptors / Indicators: Operative site guarding Pain Intervention(s): Limited activity within patient's tolerance;Repositioned;Premedicated before session    Home Living Family/patient expects to be discharged to:: Private residence Living Arrangements: Spouse/significant other Available Help at Discharge: Family Type of Home: House Home Access: Stairs to enter Entrance Stairs-Rails: Can reach both Entrance Stairs-Number of Steps: 4 Home Layout: One level Home Equipment: None      Prior Function Level of Independence: Independent         Comments: active, no falls. indep with all ADLs     Hand Dominance        Extremity/Trunk Assessment   Upper Extremity Assessment Upper Extremity Assessment: Overall WFL for tasks assessed    Lower Extremity Assessment Lower Extremity Assessment: Generalized weakness (R LE grossly 3/5; L LE grossly 4/5)  Communication   Communication: No difficulties  Cognition Arousal/Alertness: Awake/alert Behavior During Therapy: WFL for tasks assessed/performed Overall Cognitive Status: Within Functional Limits for tasks assessed                                  General Comments: anxious with all mobility, tends to hold breath during therex and mobility efforts. Needs frequent cues for relaxation      General Comments      Exercises Other Exercises Other Exercises: supine ther-ex performed on R LE including AP, quad sets, glut sets, and hip abd/add. 10 reps with min assist   Assessment/Plan    PT Assessment Patient needs continued PT services  PT Problem List Decreased strength;Decreased activity tolerance;Decreased balance;Decreased mobility;Decreased knowledge of use of DME;Pain       PT Treatment Interventions DME instruction;Gait training;Stair training;Therapeutic exercise;Balance training    PT Goals (Current goals can be found in the Care Plan section)  Acute Rehab PT Goals Patient Stated Goal: to go home PT Goal Formulation: With patient Time For Goal Achievement: 12/20/20 Potential to Achieve Goals: Good    Frequency BID   Barriers to discharge        Co-evaluation               AM-PAC PT "6 Clicks" Mobility  Outcome Measure Help needed turning from your back to your side while in a flat bed without using bedrails?: A Little Help needed moving from lying on your back to sitting on the side of a flat bed without using bedrails?: A Little Help needed moving to and from a bed to a chair (including a wheelchair)?: A Little Help needed standing up from a chair using your arms (e.g., wheelchair or bedside chair)?: A Little Help needed to walk in hospital room?: A Little Help needed climbing 3-5 steps with a railing? : A Lot 6 Click Score: 17    End of Session Equipment Utilized During Treatment: Gait belt Activity Tolerance: Patient limited by pain Patient left: in chair;with chair alarm set;with SCD's reapplied Nurse Communication: Mobility status PT Visit Diagnosis: Muscle weakness (generalized) (M62.81);Difficulty in walking, not elsewhere classified (R26.2);Pain Pain - Right/Left: Right Pain -  part of body: Hip    Time: 0172-0910 PT Time Calculation (min) (ACUTE ONLY): 38 min   Charges:   PT Evaluation $PT Eval Low Complexity: 1 Low PT Treatments $Therapeutic Exercise: 23-37 mins        Greggory Stallion, PT, DPT [phone]   Sarah Todd 12/06/2020, 4:51 PM

## 2020-12-06 NOTE — Transfer of Care (Signed)
Immediate Anesthesia Transfer of Care Note  Patient: Sarah Todd  Procedure(s) Performed: TOTAL HIP ARTHROPLASTY ANTERIOR APPROACH (Right Hip)  Patient Location: PACU  Anesthesia Type:Spinal  Level of Consciousness: awake, alert  and oriented  Airway & Oxygen Therapy: Patient Spontanous Breathing  Post-op Assessment: Report given to RN and Post -op Vital signs reviewed and stable  Post vital signs: Reviewed and stable  Last Vitals:  Vitals Value Taken Time  BP 95/57   Temp 97.8   Pulse 74   Resp 15   SpO2 97     Last Pain:  Vitals:   12/06/20 0800  TempSrc: Oral  PainSc: 0-No pain         Complications: No complications documented.

## 2020-12-06 NOTE — Anesthesia Procedure Notes (Addendum)
Spinal  Patient location during procedure: OR Start time: 12/06/2020 8:30 AM End time: 12/06/2020 8:54 AM Reason for block: surgical anesthesia Staffing Performed: anesthesiologist  Anesthesiologist: Arita Miss, MD Resident/CRNA: Jerrye Noble, CRNA Preanesthetic Checklist Completed: patient identified, IV checked, site marked, risks and benefits discussed, surgical consent, monitors and equipment checked, pre-op evaluation and timeout performed Spinal Block Patient position: sitting Prep: ChloraPrep Patient monitoring: heart rate, continuous pulse ox, blood pressure and cardiac monitor Approach: midline Location: L3-4 Injection technique: single-shot Needle Needle type: Quincke  Needle gauge: 22 G Needle length: 9 cm Assessment Sensory level: T10 Events: CSF return Additional Notes Negative paresthesia. Negative blood return. Positive free-flowing, although very slow flow, CSF. Expiration date of kit checked and confirmed. Patient tolerated procedure well, without complications. Difficult spinal placement

## 2020-12-06 NOTE — Anesthesia Preprocedure Evaluation (Signed)
Anesthesia Evaluation  Patient identified by MRN, date of birth, ID band Patient awake    Reviewed: Allergy & Precautions, NPO status , Patient's Chart, lab work & pertinent test results  History of Anesthesia Complications Negative for: history of anesthetic complications  Airway Mallampati: III  TM Distance: >3 FB Neck ROM: Full    Dental no notable dental hx. (+) Teeth Intact   Pulmonary neg pulmonary ROS, neg sleep apnea, neg COPD, Patient abstained from smoking.Not current smoker,    Pulmonary exam normal breath sounds clear to auscultation       Cardiovascular Exercise Tolerance: Good METShypertension, (-) CAD and (-) Past MI (-) dysrhythmias  Rhythm:Regular Rate:Normal - Systolic murmurs    Neuro/Psych S/p c-spine fusion, lumbar diskectomies  Neuromuscular disease negative psych ROS   GI/Hepatic neg GERD  ,(+)     (-) substance abuse  ,   Endo/Other  neg diabetes  Renal/GU negative Renal ROS     Musculoskeletal  (+) Arthritis ,   Abdominal   Peds  Hematology   Anesthesia Other Findings Past Medical History: No date: Allergy No date: Arthritis     Comment:  hands No date: Family history of adverse reaction to anesthesia     Comment:  sister but not sure of what happened No date: GERD (gastroesophageal reflux disease) No date: Hypertension No date: Osteoporosis No date: Varicose veins     Comment:  Mild  Reproductive/Obstetrics                             Anesthesia Physical Anesthesia Plan  ASA: II  Anesthesia Plan: General/Spinal   Post-op Pain Management:    Induction: Intravenous  PONV Risk Score and Plan: 3 and Ondansetron, Dexamethasone, Propofol infusion, TIVA and Treatment may vary due to age or medical condition  Airway Management Planned: Natural Airway  Additional Equipment: None  Intra-op Plan:   Post-operative Plan:   Informed Consent: I have  reviewed the patients History and Physical, chart, labs and discussed the procedure including the risks, benefits and alternatives for the proposed anesthesia with the patient or authorized representative who has indicated his/her understanding and acceptance.       Plan Discussed with: CRNA and Surgeon  Anesthesia Plan Comments: (Discussed R/B/A of neuraxial anesthesia technique with patient: - rare risks of spinal/epidural hematoma, nerve damage, infection - Risk of PDPH - Risk of nausea and vomiting - Risk of conversion to general anesthesia and its associated risks, including sore throat, damage to lips/teeth/oropharynx, and rare risks such as cardiac and respiratory events.  Patient voiced understanding.)        Anesthesia Quick Evaluation

## 2020-12-07 ENCOUNTER — Encounter: Payer: Self-pay | Admitting: Orthopedic Surgery

## 2020-12-07 DIAGNOSIS — R531 Weakness: Secondary | ICD-10-CM | POA: Diagnosis not present

## 2020-12-07 DIAGNOSIS — M1611 Unilateral primary osteoarthritis, right hip: Secondary | ICD-10-CM | POA: Diagnosis not present

## 2020-12-07 LAB — CBC
HCT: 26 % — ABNORMAL LOW (ref 36.0–46.0)
Hemoglobin: 8.9 g/dL — ABNORMAL LOW (ref 12.0–15.0)
MCH: 31.8 pg (ref 26.0–34.0)
MCHC: 34.2 g/dL (ref 30.0–36.0)
MCV: 92.9 fL (ref 80.0–100.0)
Platelets: 207 10*3/uL (ref 150–400)
RBC: 2.8 MIL/uL — ABNORMAL LOW (ref 3.87–5.11)
RDW: 12.2 % (ref 11.5–15.5)
WBC: 12.1 10*3/uL — ABNORMAL HIGH (ref 4.0–10.5)
nRBC: 0 % (ref 0.0–0.2)

## 2020-12-07 LAB — BASIC METABOLIC PANEL
Anion gap: 7 (ref 5–15)
BUN: 20 mg/dL (ref 8–23)
CO2: 25 mmol/L (ref 22–32)
Calcium: 8.5 mg/dL — ABNORMAL LOW (ref 8.9–10.3)
Chloride: 98 mmol/L (ref 98–111)
Creatinine, Ser: 0.68 mg/dL (ref 0.44–1.00)
GFR, Estimated: >60 mL/min (ref >60)
Glucose, Bld: 137 mg/dL — ABNORMAL HIGH (ref 70–99)
Potassium: 4.4 mmol/L (ref 3.5–5.1)
Sodium: 130 mmol/L — ABNORMAL LOW (ref 135–145)

## 2020-12-07 MED ORDER — METHOCARBAMOL 500 MG PO TABS: 500.0000 mg | ORAL_TABLET | Freq: Four times a day (QID) | ORAL | 1 refills | Status: DC | PRN

## 2020-12-07 MED ORDER — DOCUSATE SODIUM 100 MG PO CAPS: 100.0000 mg | ORAL_CAPSULE | Freq: Two times a day (BID) | ORAL | 0 refills | Status: DC

## 2020-12-07 MED ORDER — HYDROCODONE-ACETAMINOPHEN 5-325 MG PO TABS: 1.0000 | ORAL_TABLET | ORAL | 0 refills | Status: DC | PRN

## 2020-12-07 MED ORDER — ASPIRIN 81 MG PO CHEW: 81.0000 mg | CHEWABLE_TABLET | Freq: Two times a day (BID) | ORAL | 0 refills | Status: DC

## 2020-12-07 NOTE — Progress Notes (Signed)
DC order noted.  Reviewed discharge instructions with patient including follow up appointment with provider, medication, prescriptions, activity, wound care and when to notify physician.  Questions answered and understanding verbalized.  Copy given.  Patient discharged off unit at this time stable without complaint.

## 2020-12-07 NOTE — Progress Notes (Signed)
Physical Therapy Treatment Patient Details Name: Sarah Todd MRN: [id number] DOB: 03-Jul-1946 Today's Date: 12/07/2020    History of Present Illness Pt admitted for R THR. History includes GERD and HTN.    PT Comments    Pt is making good progress and is safe to dc home this date. Has met all PT goals at this time. Safe technique with stair training, ambulation, and there-ex. Reviewed car transfers. Pt unable to sleep in elevated bed due to height at home, problem solved solutions including sleeping on recliner/couch. RN aware. Will dc at this time.   Follow Up Recommendations  Outpatient PT;Supervision for mobility/OOB     Equipment Recommendations  Rolling walker with 5" wheels;3in1 (PT) (youth size please)    Recommendations for Other Services       Precautions / Restrictions Precautions Precautions: Fall;Anterior Hip Precaution Booklet Issued: Yes (comment) Restrictions Weight Bearing Restrictions: Yes RLE Weight Bearing: Weight bearing as tolerated    Mobility  Bed Mobility Overal bed mobility: Needs Assistance Bed Mobility: Supine to Sit     Supine to sit: Min guard     General bed mobility comments: improved technique this date. Follows commands well, however still needs heavy cues for sequencing. Once seated at EOB, no dizziness noted    Transfers Overall transfer level: Needs assistance Equipment used: Rolling walker (2 wheeled) Transfers: Sit to/from Stand Sit to Stand: Min guard         General transfer comment: multiple sit<>Stands from various height and surfaces. Cues for pushing from seated surface. RW used  Ambulation/Gait Ambulation/Gait assistance: Counsellor (Feet): 250 Feet Assistive device: Rolling walker (2 wheeled) Gait Pattern/deviations: Step-through pattern     General Gait Details: ambulated around RN station with safe technique. Reciprocal gait pattern noted. Tends to let RW get too far infront of body, needing  cues for safety.   Stairs Stairs: Yes Stairs assistance: Min guard Stair Management: Two rails;Forwards;Step to pattern Number of Stairs: 4 General stair comments: up/down with safe technique with demonstration provided prior to attempt. Able to perform 2 sets of 4 stairs with cga. Needs frequent reminders of sequencing.   Wheelchair Mobility    Modified Rankin (Stroke Patients Only)       Balance Overall balance assessment: Needs assistance Sitting-balance support: No upper extremity supported;Feet supported Sitting balance-Leahy Scale: Fair     Standing balance support: Bilateral upper extremity supported Standing balance-Leahy Scale: Good                              Cognition Arousal/Alertness: Awake/alert Behavior During Therapy: WFL for tasks assessed/performed Overall Cognitive Status: Within Functional Limits for tasks assessed                                 General Comments: less anxious this session, however still needs cues for safety      Exercises Other Exercises Other Exercises: supine ther-ex performed on R LE including AP, quad sets, SAQ, glut sets, and hip abd/add. 12 reps with cga Other Exercises: heavy education provided on HEP and stair sequencing.    General Comments        Pertinent Vitals/Pain Pain Assessment: 0-10 Pain Score: 5  Pain Location: R hip Pain Descriptors / Indicators: Operative site guarding Pain Intervention(s): Limited activity within patient's tolerance;Patient requesting pain meds-RN notified;RN gave pain meds during session  Home Living                      Prior Function            PT Goals (current goals can now be found in the care plan section) Acute Rehab PT Goals Patient Stated Goal: to go home PT Goal Formulation: With patient Time For Goal Achievement: 12/20/20 Potential to Achieve Goals: Good Progress towards PT goals: Progressing toward goals    Frequency     BID      PT Plan Current plan remains appropriate    Co-evaluation              AM-PAC PT "6 Clicks" Mobility   Outcome Measure  Help needed turning from your back to your side while in a flat bed without using bedrails?: A Little Help needed moving from lying on your back to sitting on the side of a flat bed without using bedrails?: A Little Help needed moving to and from a bed to a chair (including a wheelchair)?: A Little Help needed standing up from a chair using your arms (e.g., wheelchair or bedside chair)?: A Little Help needed to walk in hospital room?: A Little Help needed climbing 3-5 steps with a railing? : A Little 6 Click Score: 18    End of Session Equipment Utilized During Treatment: Gait belt Activity Tolerance: Patient tolerated treatment well Patient left: in chair;with chair alarm set;with SCD's reapplied Nurse Communication: Mobility status PT Visit Diagnosis: Muscle weakness (generalized) (M62.81);Difficulty in walking, not elsewhere classified (R26.2);Pain Pain - Right/Left: Right Pain - part of body: Hip     Time: 1021-1173 PT Time Calculation (min) (ACUTE ONLY): 55 min  Charges:  $Gait Training: 23-37 mins $Therapeutic Exercise: 23-37 mins                     Sarah Todd, PT, DPT [phone]    Sarah Todd 12/07/2020, 10:53 AM

## 2020-12-07 NOTE — Discharge Summary (Signed)
Physician Discharge Summary  Patient ID: Sarah Todd MRN: [id number] DOB/AGE: 04/09/1946 75 y.o.  Admit date: 12/06/2020 Discharge date: 12/07/2020  Admission Diagnoses:  M16.11 Unilateral primary osteoarthritis, right hip <principal problem not specified>  Discharge Diagnoses:  M16.11 Unilateral primary osteoarthritis, right hip Active Problems:   Status post total hip replacement, right   Past Medical History:  Diagnosis Date  . Allergy   . Arthritis    hands  . Family history of adverse reaction to anesthesia    sister but not sure of what happened  . GERD (gastroesophageal reflux disease)   . Hypertension   . Osteoporosis   . Varicose veins    Mild    Surgeries: Procedure(s): TOTAL HIP ARTHROPLASTY ANTERIOR APPROACH on 12/06/2020   Consultants (if any):   Discharged Condition: Improved  Hospital Course: Sarah Todd is an 75 y.o. female who was admitted 12/06/2020 with a diagnosis of  M16.11 Unilateral primary osteoarthritis, right hip <principal problem not specified> and went to the operating room on 12/06/2020 and underwent the above named procedures.    She was given perioperative antibiotics:  Anti-infectives (From admission, onward)   Start     Dose/Rate Route Frequency Ordered Stop   12/06/20 1500  ceFAZolin (ANCEF) IVPB 1 g/50 mL premix  Status:  Discontinued        1 g 100 mL/hr over 30 Minutes Intravenous Every 6 hours 12/06/20 1246 12/06/20 1330   12/06/20 1500  ceFAZolin (ANCEF) 1 g in sodium chloride 0.9 % 100 mL IVPB        1 g 200 mL/hr over 30 Minutes Intravenous Every 6 hours 12/06/20 1330 12/06/20 2353   12/06/20 0752  ceFAZolin (ANCEF) 2-4 GM/100ML-% IVPB       Note to Pharmacy: [facility]   : cabinet override      12/06/20 0752 12/06/20 0858   12/06/20 0600  ceFAZolin (ANCEF) IVPB 2g/100 mL premix        2 g 200 mL/hr over 30 Minutes Intravenous On call to O.R. 12/06/20 0139 12/06/20 0855    .  She was given sequential  compression devices, early ambulation, and aspirin for DVT prophylaxis.  She benefited maximally from the hospital stay and there were no complications.    Recent vital signs:  Vitals:   12/07/20 0014 12/07/20 0357  BP: 118/64 (!) 108/58  Pulse: 90 82  Resp: 16 16  Temp: 98.5 F (36.9 C) 97.6 F (36.4 C)  SpO2: 96% 96%    Recent laboratory studies:  Lab Results  Component Value Date   HGB 8.9 (L) 12/07/2020   HGB 14.1 11/28/2020   HGB 13.1 12/20/2019   Lab Results  Component Value Date   WBC 12.1 (H) 12/07/2020   PLT 207 12/07/2020   Lab Results  Component Value Date   INR 1.0 11/28/2020   Lab Results  Component Value Date   NA 130 (L) 12/07/2020   K 4.4 12/07/2020   CL 98 12/07/2020   CO2 25 12/07/2020   BUN 20 12/07/2020   CREATININE 0.68 12/07/2020   GLUCOSE 137 (H) 12/07/2020    Discharge Medications:   Allergies as of 12/07/2020      Reactions   Lisinopril Anaphylaxis   Shrimp [shellfish Allergy] Anaphylaxis   Aspercreme [trolamine Salicylate] Rash   Lidocaine Rash   Patches broke her skin out      Medication List    STOP taking these medications   aspirin 81 MG tablet Replaced by: aspirin  81 MG chewable tablet     TAKE these medications   acetaminophen 650 MG CR tablet Commonly known as: TYLENOL Take 2,600 mg by mouth daily as needed for pain.   aspirin 81 MG chewable tablet Chew 1 tablet (81 mg total) by mouth 2 (two) times daily. Replaces: aspirin 81 MG tablet   CALCIUM 600+D PO Take 1 capsule by mouth daily.   CoQ-10 100 MG capsule Take 1 capsule (100 mg total) by mouth daily.   docusate sodium 100 MG capsule Commonly known as: COLACE Take 1 capsule (100 mg total) by mouth 2 (two) times daily.   estradiol 1 MG tablet Commonly known as: ESTRACE Take 0.5 mg by mouth daily.   Fish Oil 1000 MG Caps Take 1,000-2,000 mg by mouth See admin instructions. Take 1000 mg in the morning and 2000 mg at night   gabapentin 600 MG  tablet Commonly known as: NEURONTIN Take 600-900 mg by mouth See admin instructions. Take 600 mg by mouth in the morning and 900 mg at night   HYDROcodone-acetaminophen 5-325 MG tablet Commonly known as: NORCO/VICODIN Take 1 tablet by mouth every 4 (four) hours as needed for moderate pain (pain score 4-6).   hydrocortisone 2.5 % cream Apply topically 2 (two) times daily. What changed:   how much to take  when to take this  reasons to take this   losartan 50 MG tablet Commonly known as: COZAAR Take 1 tablet (50 mg total) by mouth every morning.   methocarbamol 500 MG tablet Commonly known as: ROBAXIN Take 1 tablet (500 mg total) by mouth every 6 (six) hours as needed for muscle spasms.   multivitamin capsule Take 1 capsule by mouth daily.   neomycin-polymyxin b-dexamethasone 3.5-10000-0.1 Oint Commonly known as: MAXITROL Place 1 application into both eyes 4 (four) times daily as needed (eye infections).   PROBIOTIC PO Take 1 capsule by mouth daily.   rosuvastatin 5 MG tablet Commonly known as: Crestor Take 1 tablet (5 mg total) by mouth daily.   triamcinolone 0.1 % Commonly known as: KENALOG Apply 1 application topically 2 (two) times daily. What changed:   when to take this  reasons to take this   vitamin C 500 MG tablet Commonly known as: ASCORBIC ACID Take 500 mg by mouth daily.   Vitamin D 50 MCG (2000 UT) tablet Take 2,000 Units by mouth daily.   ZzzQuil 25 MG Caps Generic drug: diphenhydrAMINE HCl (Sleep) Take 50 mg by mouth at bedtime.            Durable Medical Equipment  (From admission, onward)         Start     Ordered   12/07/20 0645  For home use only DME 3 n 1  Once        12/07/20 0644   12/07/20 0645  For home use only DME Walker rolling  Once       Question Answer Comment  Walker: With 5 Inch Wheels   Patient needs a walker to treat with the following condition Osteoarthritis of right hip      12/07/20 0644   12/06/20 1247   DME Walker rolling  Once       Question:  Patient needs a walker to treat with the following condition  Answer:  Status post total hip replacement, right   12/06/20 1246   12/06/20 1247  DME 3 n 1  Once        12/06/20 1246   12/06/20  71  DME Bedside commode  Once       Question:  Patient needs a bedside commode to treat with the following condition  Answer:  Status post total hip replacement, right   12/06/20 1246          Diagnostic Studies: DG HIP OPERATIVE UNILAT W OR W/O PELVIS RIGHT  Result Date: 12/06/2020 CLINICAL DATA:  Anterior approach right total hip replacement EXAM: OPERATIVE RIGHT HIP (WITH PELVIS IF PERFORMED) 1 VIEW TECHNIQUE: Fluoroscopic spot image(s) were submitted for interpretation post-operatively. COMPARISON:  None. FINDINGS: A single spot anterior projection intraoperative fluoroscopic image is provided of the right hip demonstrating the sequela of total hip replacement. Alignment appears anatomic given AP projection. There is a minimal amount of expected subcutaneous emphysema about the operative site. No radiopaque body. IMPRESSION: Post right total hip replacement without apparent complication. Electronically Signed   By: Sandi Mariscal M.D.   On: 12/06/2020 10:31    Disposition: Discharge disposition: 01-Home or Self Care       Discharge Instructions    Call MD / Call 911   Complete by: As directed    If you experience chest pain or shortness of breath, CALL 911 and be transported to the hospital emergency room.  If you develope a fever above 101 F, pus (white drainage) or increased drainage or redness at the wound, or calf pain, call your surgeon's office.   Constipation Prevention   Complete by: As directed    Drink plenty of fluids.  Prune juice may be helpful.  You may use a stool softener, such as Colace (over the counter) 100 mg twice a day.  Use MiraLax (over the counter) for constipation as needed.   Diet - low sodium heart healthy   Complete by: As  directed    Increase activity slowly as tolerated   Complete by: As directed          Signed: Carlynn Spry ,PA-C 12/07/2020, 6:44 AM

## 2020-12-07 NOTE — Plan of Care (Signed)

## 2020-12-07 NOTE — Discharge Instructions (Signed)

## 2020-12-07 NOTE — Plan of Care (Signed)
Alert and oriented x4, on room air, c/o pain between 4-7/10, medicated per order, pain medication effective in reducing pain and discomfort to tolerable rate 3/10. Surgical site with intact dressing, clean dry, no drainage noted. BLE with good capillary refill and palpable pulses. Assisted to bedside commode 1 - 2 person assist with walker, ambulates with difficulty. Maintained foam wedge, ted hose and SCD's. Urinated with adequate output. Call light and personal effects within reach.

## 2020-12-07 NOTE — Anesthesia Postprocedure Evaluation (Signed)
Anesthesia Post Note  Patient: Sarah Todd  Procedure(s) Performed: TOTAL HIP ARTHROPLASTY ANTERIOR APPROACH (Right Hip)  Patient location during evaluation: Nursing Unit Anesthesia Type: Combined General/Spinal Level of consciousness: awake and alert Pain management: pain level controlled Respiratory status: spontaneous breathing Cardiovascular status: stable Anesthetic complications: no   No complications documented.   Last Vitals:  Vitals:   12/07/20 0014 12/07/20 0357  BP: 118/64 (!) 108/58  Pulse: 90 82  Resp: 16 16  Temp: 36.9 C 36.4 C  SpO2: 96% 96%    Last Pain:  Vitals:   12/07/20 0357  TempSrc: Oral  PainSc:                  Sarah Todd

## 2020-12-07 NOTE — Progress Notes (Signed)
Met with the patient to discuss DC plan and needs She lives at home with her spouse She needs a youth RW and a 3 in 1, I notified Mardene Celeste with [employer], it will be brought into her room She has an outpatient PT appointment on 4/13, no additional needs

## 2020-12-07 NOTE — Progress Notes (Signed)
  Subjective:  Patient reports pain as mild to moderate.    Objective:   VITALS:   Vitals:   12/06/20 1629 12/06/20 1937 12/07/20 0014 12/07/20 0357  BP: 107/61 111/62 118/64 (!) 108/58  Pulse: 88 (!) 101 90 82  Resp: $Remo'20 16 16 16  'afcEJ$ Temp: 97.7 F (36.5 C) 98 F (36.7 C) 98.5 F (36.9 C) 97.6 F (36.4 C)  TempSrc:   Oral Oral  SpO2: 100% 98% 96% 96%  Weight:      Height:        PHYSICAL EXAM:  Neurologically intact ABD soft Neurovascular intact Sensation intact distally Intact pulses distally Dorsiflexion/Plantar flexion intact Incision: scant drainage and dresing changed today No cellulitis present Compartment soft  LABS  Results for orders placed or performed during the hospital encounter of 12/06/20 (from the past 24 hour(s))  CBC     Status: Abnormal   Collection Time: 12/07/20  5:13 AM  Result Value Ref Range   WBC 12.1 (H) 4.0 - 10.5 K/uL   RBC 2.80 (L) 3.87 - 5.11 MIL/uL   Hemoglobin 8.9 (L) 12.0 - 15.0 g/dL   HCT 26.0 (L) 36.0 - 46.0 %   MCV 92.9 80.0 - 100.0 fL   MCH 31.8 26.0 - 34.0 pg   MCHC 34.2 30.0 - 36.0 g/dL   RDW 12.2 11.5 - 15.5 %   Platelets 207 150 - 400 K/uL   nRBC 0.0 0.0 - 0.2 %  Basic metabolic panel     Status: Abnormal   Collection Time: 12/07/20  5:13 AM  Result Value Ref Range   Sodium 130 (L) 135 - 145 mmol/L   Potassium 4.4 3.5 - 5.1 mmol/L   Chloride 98 98 - 111 mmol/L   CO2 25 22 - 32 mmol/L   Glucose, Bld 137 (H) 70 - 99 mg/dL   BUN 20 8 - 23 mg/dL   Creatinine, Ser 0.68 0.44 - 1.00 mg/dL   Calcium 8.5 (L) 8.9 - 10.3 mg/dL   GFR, Estimated >60 >60 mL/min   Anion gap 7 5 - 15    DG HIP OPERATIVE UNILAT W OR W/O PELVIS RIGHT  Result Date: 12/06/2020 CLINICAL DATA:  Anterior approach right total hip replacement EXAM: OPERATIVE RIGHT HIP (WITH PELVIS IF PERFORMED) 1 VIEW TECHNIQUE: Fluoroscopic spot image(s) were submitted for interpretation post-operatively. COMPARISON:  None. FINDINGS: A single spot anterior projection  intraoperative fluoroscopic image is provided of the right hip demonstrating the sequela of total hip replacement. Alignment appears anatomic given AP projection. There is a minimal amount of expected subcutaneous emphysema about the operative site. No radiopaque body. IMPRESSION: Post right total hip replacement without apparent complication. Electronically Signed   By: Sandi Mariscal M.D.   On: 12/06/2020 10:31    Assessment/Plan: 1 Day Post-Op   Active Problems:   Status post total hip replacement, right   Advance diet Up with therapy  Discharge home today if PT goals met   Carlynn Spry , PA-C 12/07/2020, 6:36 AM

## 2020-12-07 NOTE — Plan of Care (Incomplete Revision)
Alert and oriented x4, on room air, c/o pain between 4-7/10, medicated per order, pain medication effective in reducing pain and discomfort to tolerable rate 3/10. Surgical site with intact dressing, clean dry, no drainage noted. BLE with good capillary refill and palpable pulses. Assisted to bedside commode 1 - 2 person assist with walker, ambulates with difficulty. Maintained foam wedge, ted hose and SCD's. Provided and educated on incentive spirometer use. Urinated with adequate output. Call light and personal effects within reach.

## 2020-12-10 ENCOUNTER — Encounter: Payer: Self-pay | Admitting: Physician Assistant

## 2020-12-13 DIAGNOSIS — R600 Localized edema: Secondary | ICD-10-CM | POA: Diagnosis not present

## 2020-12-13 DIAGNOSIS — Z96641 Presence of right artificial hip joint: Secondary | ICD-10-CM | POA: Diagnosis not present

## 2020-12-13 DIAGNOSIS — M1611 Unilateral primary osteoarthritis, right hip: Secondary | ICD-10-CM | POA: Diagnosis not present

## 2020-12-20 DIAGNOSIS — Z96641 Presence of right artificial hip joint: Secondary | ICD-10-CM | POA: Diagnosis not present

## 2020-12-20 DIAGNOSIS — M1611 Unilateral primary osteoarthritis, right hip: Secondary | ICD-10-CM | POA: Diagnosis not present

## 2020-12-28 DIAGNOSIS — M1611 Unilateral primary osteoarthritis, right hip: Secondary | ICD-10-CM | POA: Diagnosis not present

## 2020-12-28 DIAGNOSIS — Z96641 Presence of right artificial hip joint: Secondary | ICD-10-CM | POA: Diagnosis not present

## 2020-12-30 NOTE — H&P (Signed)
NAME: Sarah Todd MRN:   [id number] DOB:   June 09, 1946     HISTORY AND PHYSICAL   CHIEF COMPLAINT:  Right hip pain  HISTORY:   Rozina Pointer Bordeauxis a 75 y.o. female  with right  Hip Pain Patient complains of right hip pain. Onset of the symptoms was several months ago. Inciting event: none. The patient reports the hip pain is worse with weight bearing. Associated symptoms: none, injury. Aggravating symptoms include: any weight bearing and going up and down stairs. Patient has had no prior hip problems. Previous visits for this problem: multiple, this is a longstanding diagnosis. Last seen several weeks ago by Dr. Harlow Mares. Evaluation to date: plain films, which were abnormal  osteoarthritis. Treatment to date: OTC analgesics, which have been somewhat effective, prescription analgesics, which have been somewhat effective, home exercise program, which has been somewhat effective and physical therapy, which has been somewhat effective.  Plan for right total hip arthroplasty.  PAST MEDICAL HISTORY:       Past Medical History:  Diagnosis Date  . Allergy   . Arthritis    hands  . Family history of adverse reaction to anesthesia    sister but not sure of what happened  . GERD (gastroesophageal reflux disease)   . Hypertension   . Osteoporosis   . Varicose veins    Mild    PAST SURGICAL HISTORY:        Past Surgical History:  Procedure Laterality Date  . ABDOMINAL HYSTERECTOMY    . ANTERIOR CERVICAL DECOMP/DISCECTOMY FUSION N/A 07/21/2019   Procedure: ANTERIOR CERVICAL DECOMPRESSION/DISCECTOMY FUSION CERVICAL THREE- CERVICAL FOUR, CERVICAL FOUR- CERVICAL FIVE, CERVICAL FIVE- CERVICAL SIX;  Surgeon: Jovita Gamma, MD;  Location: [place];  Service: Neurosurgery;  Laterality: N/A;  ANTERIOR CERVICAL DECOMPRESSION/DISCECTOMY FUSION CERVICAL 3- CERVICAL 4, CERVICAL 4- CERVICAL 5, CERVICAL 5- CERVICAL 6  . BACK SURGERY     cervical and lumbar    . COLONOSCOPY N/A  01/05/2015   Procedure: COLONOSCOPY;  Surgeon: Lucilla Lame, MD;  Location: [facility];  Service: Gastroenterology;  Laterality: N/A;  . COLONOSCOPY WITH PROPOFOL N/A 01/25/2020   Procedure: COLONOSCOPY WITH PROPOFOL;  Surgeon: Lucilla Lame, MD;  Location: [facility];  Service: Endoscopy;  Laterality: N/A;  . DILATION AND CURETTAGE OF UTERUS    . FRACTURE SURGERY     clavicle right  . TOOTH EXTRACTION    . TUBAL LIGATION      MEDICATIONS:  (Not in a hospital admission)   ALLERGIES:        Allergies  Allergen Reactions  . Lisinopril Anaphylaxis  . Shrimp [Shellfish Allergy] Anaphylaxis  . Aspercreme [Trolamine Salicylate] Rash  . Lidocaine Rash    Patches broke her skin out    REVIEW OF SYSTEMS:   Negative except HPI  FAMILY HISTORY:        Family History  Problem Relation Age of Onset  . Cancer Mother        colon cancer  . Alzheimer's disease Father   . Heart disease Father   . Hypertension Father   . Cancer Sister        uterine  . Breast cancer Neg Hx     SOCIAL HISTORY:   reports that she has never smoked. She has never used smokeless tobacco. She reports that she does not drink alcohol and does not use drugs.  PHYSICAL EXAM:  General appearance: alert, cooperative and no distress Neck: no JVD and supple, symmetrical, trachea midline Resp: clear to  auscultation bilaterally Cardio: regular rate and rhythm, S1, S2 normal, no murmur, click, rub or gallop GI: soft, non-tender; bowel sounds normal; no masses,  no organomegaly Extremities: extremities normal, atraumatic, no cyanosis or edema and Homans sign is negative, no sign of DVT Pulses: 2+ and symmetric Skin: Skin color, texture, turgor normal. No rashes or lesions Neurologic: Alert and oriented X 3, normal strength and tone. Normal symmetric reflexes. Normal coordination and gait    LABORATORY STUDIES: Recent Labs (last 2 labs)   No results for input(s): WBC, HGB,  HCT, PLT in the last 72 hours.                Recent Labs (last 2 labs)      Recent Labs    11/27/20 0953  NA 136  K 4.6  CL 97  CO2 25  GLUCOSE 90  BUN 10  CREATININE 0.58  CALCIUM 9.8      STUDIES/RESULTS:              Imaging Results  No results found.    ASSESSMENT:  End stage osteoarthritis right hip                              Active Problems:   * No active hospital problems. *    PLAN:  Right Primary Total Hip Arthroplasty  Carlynn Spry 12/30/2020. 3:32 PM

## 2021-01-02 DIAGNOSIS — Z96641 Presence of right artificial hip joint: Secondary | ICD-10-CM | POA: Diagnosis not present

## 2021-01-02 DIAGNOSIS — M1611 Unilateral primary osteoarthritis, right hip: Secondary | ICD-10-CM | POA: Diagnosis not present

## 2021-01-04 DIAGNOSIS — Z96641 Presence of right artificial hip joint: Secondary | ICD-10-CM | POA: Diagnosis not present

## 2021-01-04 DIAGNOSIS — M1611 Unilateral primary osteoarthritis, right hip: Secondary | ICD-10-CM | POA: Diagnosis not present

## 2021-01-09 DIAGNOSIS — Z96641 Presence of right artificial hip joint: Secondary | ICD-10-CM | POA: Diagnosis not present

## 2021-01-09 DIAGNOSIS — M1611 Unilateral primary osteoarthritis, right hip: Secondary | ICD-10-CM | POA: Diagnosis not present

## 2021-01-11 DIAGNOSIS — M1611 Unilateral primary osteoarthritis, right hip: Secondary | ICD-10-CM | POA: Diagnosis not present

## 2021-01-11 DIAGNOSIS — Z96641 Presence of right artificial hip joint: Secondary | ICD-10-CM | POA: Diagnosis not present

## 2021-01-16 DIAGNOSIS — Z96641 Presence of right artificial hip joint: Secondary | ICD-10-CM | POA: Diagnosis not present

## 2021-01-18 ENCOUNTER — Other Ambulatory Visit: Payer: Self-pay | Admitting: Family Medicine

## 2021-01-18 DIAGNOSIS — Z1231 Encounter for screening mammogram for malignant neoplasm of breast: Secondary | ICD-10-CM

## 2021-01-23 DIAGNOSIS — Z96641 Presence of right artificial hip joint: Secondary | ICD-10-CM | POA: Diagnosis not present

## 2021-01-23 DIAGNOSIS — M1611 Unilateral primary osteoarthritis, right hip: Secondary | ICD-10-CM | POA: Diagnosis not present

## 2021-01-25 DIAGNOSIS — M1611 Unilateral primary osteoarthritis, right hip: Secondary | ICD-10-CM | POA: Diagnosis not present

## 2021-01-25 DIAGNOSIS — Z96641 Presence of right artificial hip joint: Secondary | ICD-10-CM | POA: Diagnosis not present

## 2021-01-30 DIAGNOSIS — M1611 Unilateral primary osteoarthritis, right hip: Secondary | ICD-10-CM | POA: Diagnosis not present

## 2021-01-30 DIAGNOSIS — Z96641 Presence of right artificial hip joint: Secondary | ICD-10-CM | POA: Diagnosis not present

## 2021-02-01 DIAGNOSIS — R600 Localized edema: Secondary | ICD-10-CM | POA: Diagnosis not present

## 2021-02-01 DIAGNOSIS — M1611 Unilateral primary osteoarthritis, right hip: Secondary | ICD-10-CM | POA: Diagnosis not present

## 2021-02-01 DIAGNOSIS — Z96641 Presence of right artificial hip joint: Secondary | ICD-10-CM | POA: Diagnosis not present

## 2021-02-05 DIAGNOSIS — R600 Localized edema: Secondary | ICD-10-CM | POA: Diagnosis not present

## 2021-02-05 DIAGNOSIS — M1611 Unilateral primary osteoarthritis, right hip: Secondary | ICD-10-CM | POA: Diagnosis not present

## 2021-02-05 DIAGNOSIS — Z96641 Presence of right artificial hip joint: Secondary | ICD-10-CM | POA: Diagnosis not present

## 2021-02-07 DIAGNOSIS — R600 Localized edema: Secondary | ICD-10-CM | POA: Diagnosis not present

## 2021-02-07 DIAGNOSIS — M1611 Unilateral primary osteoarthritis, right hip: Secondary | ICD-10-CM | POA: Diagnosis not present

## 2021-02-07 DIAGNOSIS — Z96641 Presence of right artificial hip joint: Secondary | ICD-10-CM | POA: Diagnosis not present

## 2021-02-14 DIAGNOSIS — Z96641 Presence of right artificial hip joint: Secondary | ICD-10-CM | POA: Diagnosis not present

## 2021-02-14 DIAGNOSIS — M1611 Unilateral primary osteoarthritis, right hip: Secondary | ICD-10-CM | POA: Diagnosis not present

## 2021-02-20 DIAGNOSIS — M1611 Unilateral primary osteoarthritis, right hip: Secondary | ICD-10-CM | POA: Diagnosis not present

## 2021-02-20 DIAGNOSIS — Z96641 Presence of right artificial hip joint: Secondary | ICD-10-CM | POA: Diagnosis not present

## 2021-02-23 ENCOUNTER — Other Ambulatory Visit: Payer: Self-pay

## 2021-02-23 ENCOUNTER — Ambulatory Visit
Admission: RE | Admit: 2021-02-23 | Discharge: 2021-02-23 | Disposition: A | Discharge status: Home / Self Care | Payer: PPO | Source: Ambulatory Visit | Attending: Family Medicine | Admitting: Family Medicine

## 2021-02-23 DIAGNOSIS — Z1231 Encounter for screening mammogram for malignant neoplasm of breast: Secondary | ICD-10-CM

## 2021-02-23 IMAGING — MG MM DIGITAL SCREENING BILAT W/ TOMO AND CAD
6 of 10 series · 6 of 30 positions shown · non-contrast
Comparison: Previous exam(s).

CLINICAL DATA: Screening.

EXAM:
DIGITAL SCREENING BILATERAL MAMMOGRAM WITH TOMOSYNTHESIS AND CAD
TECHNIQUE: Bilateral screening digital craniocaudal and mediolateral oblique
mammograms were obtained. Bilateral screening digital breast
tomosynthesis was performed. The images were evaluated with
computer-aided detection.

[L CC synth-2D]
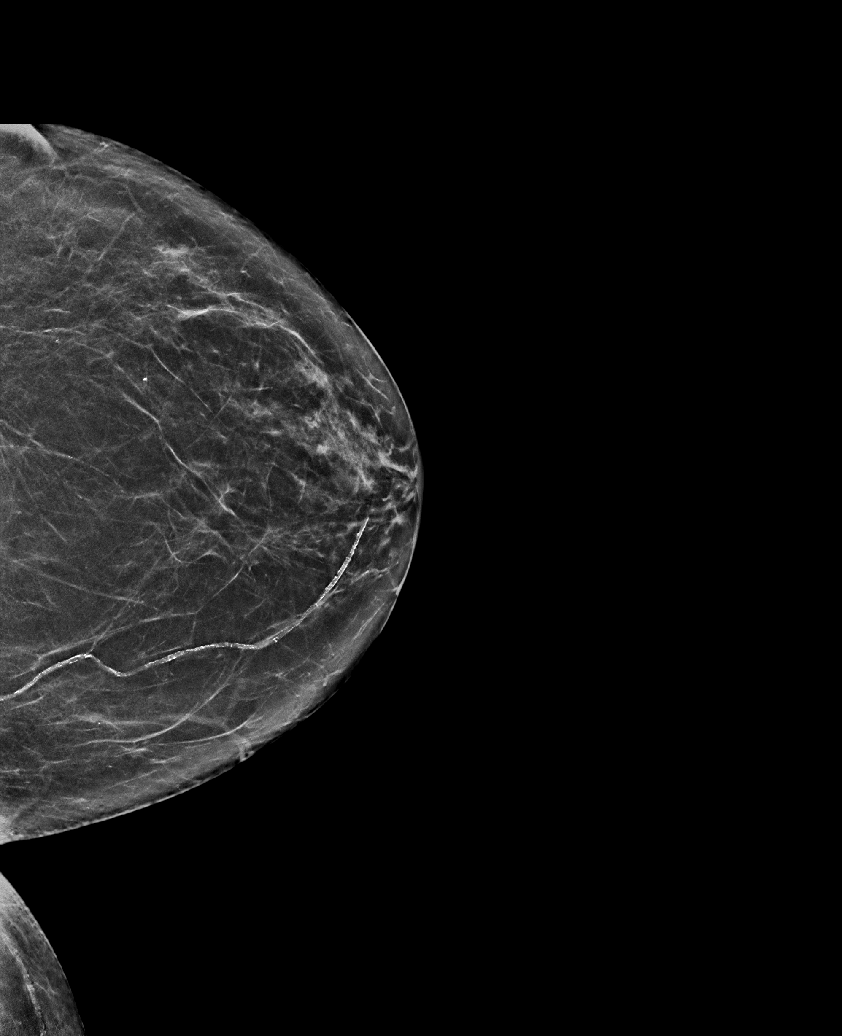

[R MLO synth-2D (1 of 2)]
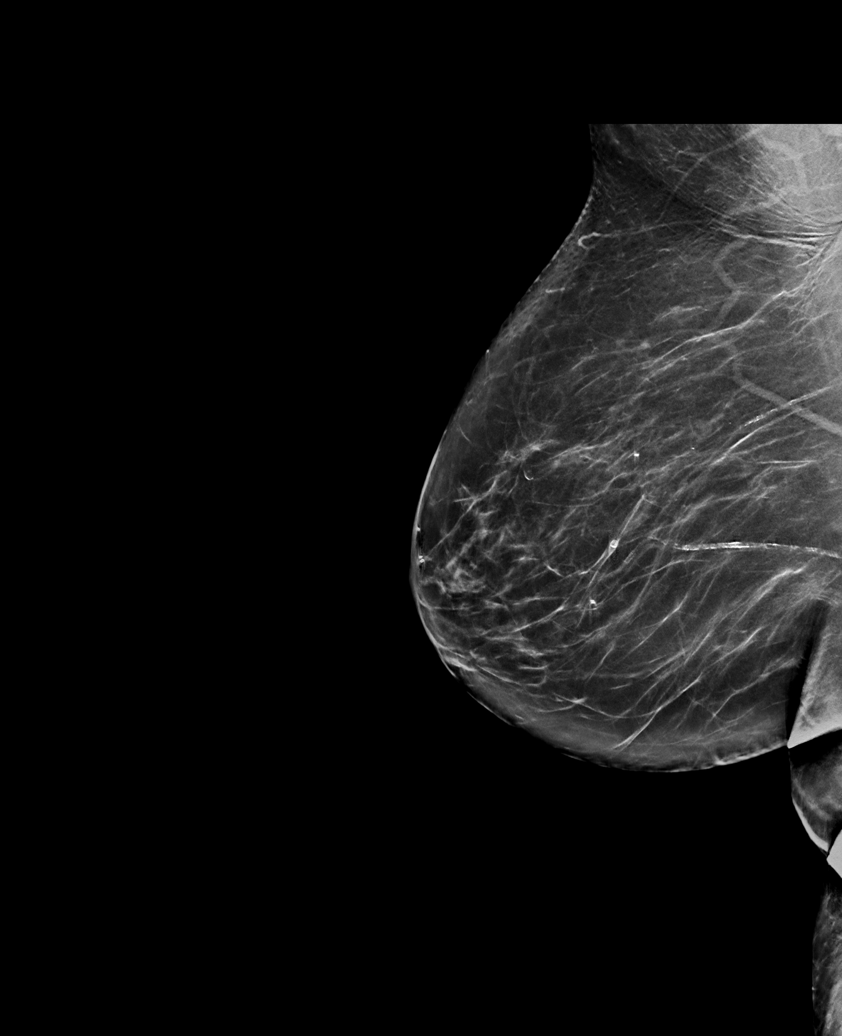

[R CC synth-2D]
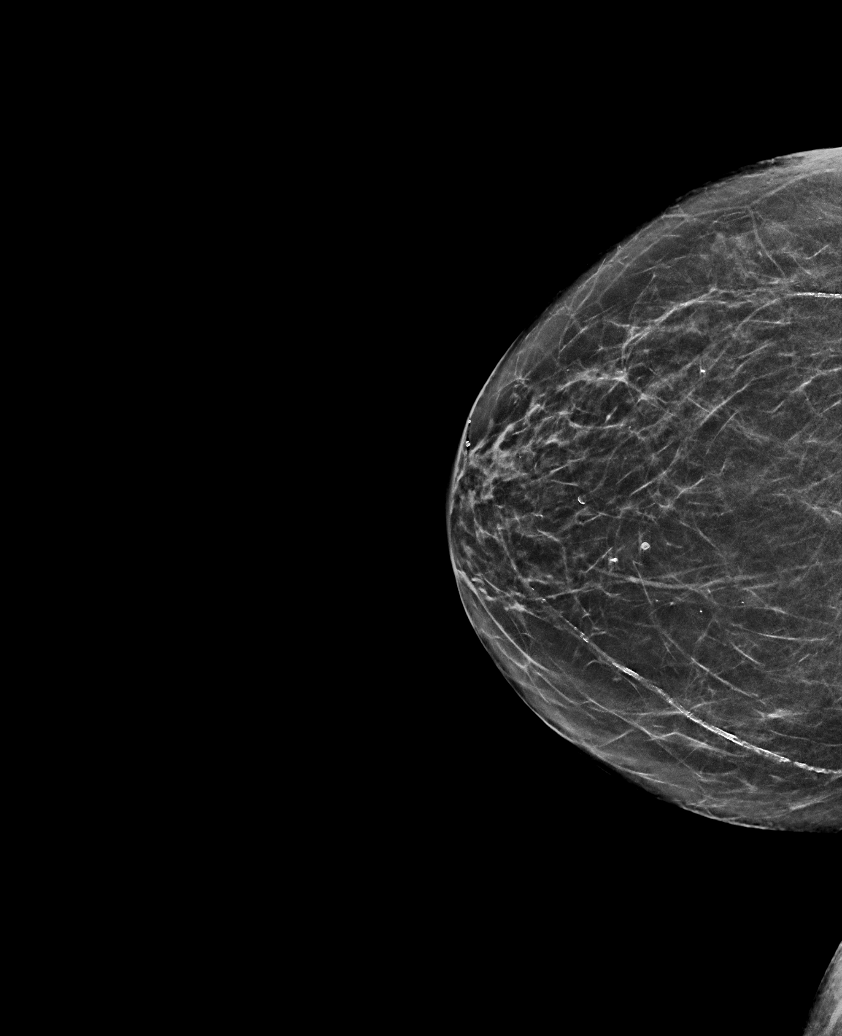

[L MLO synth-2D]
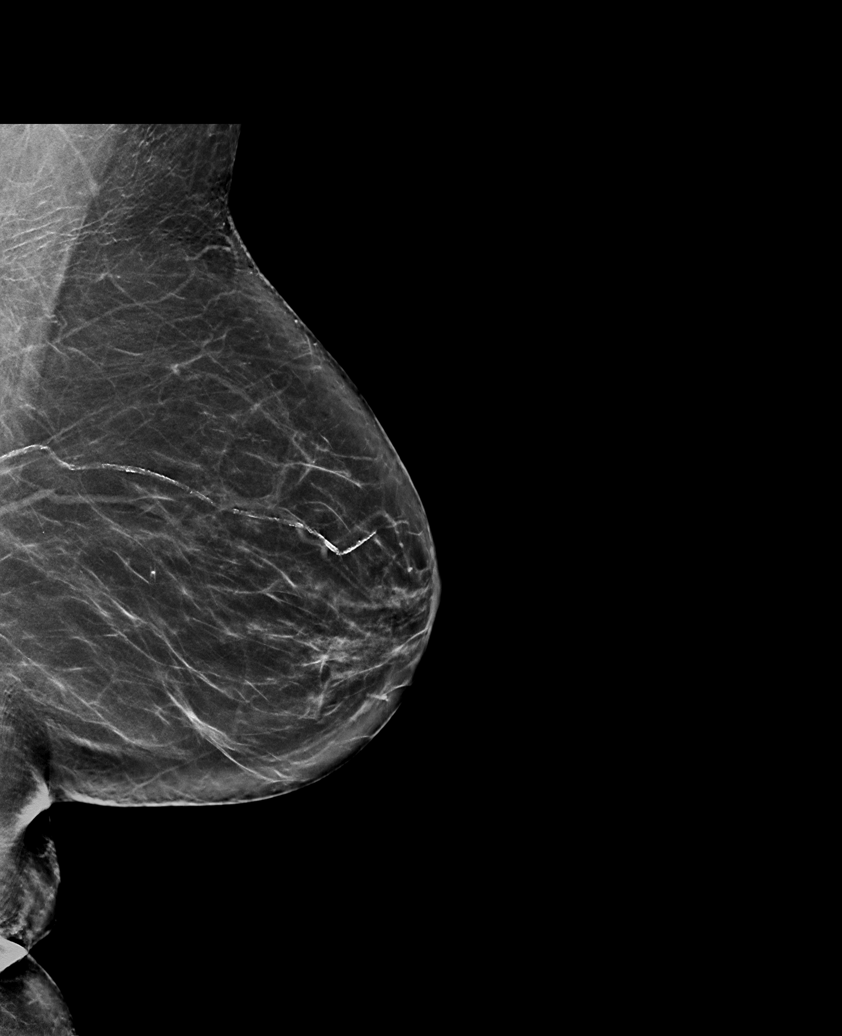

[R MLO synth-2D (2 of 2)]
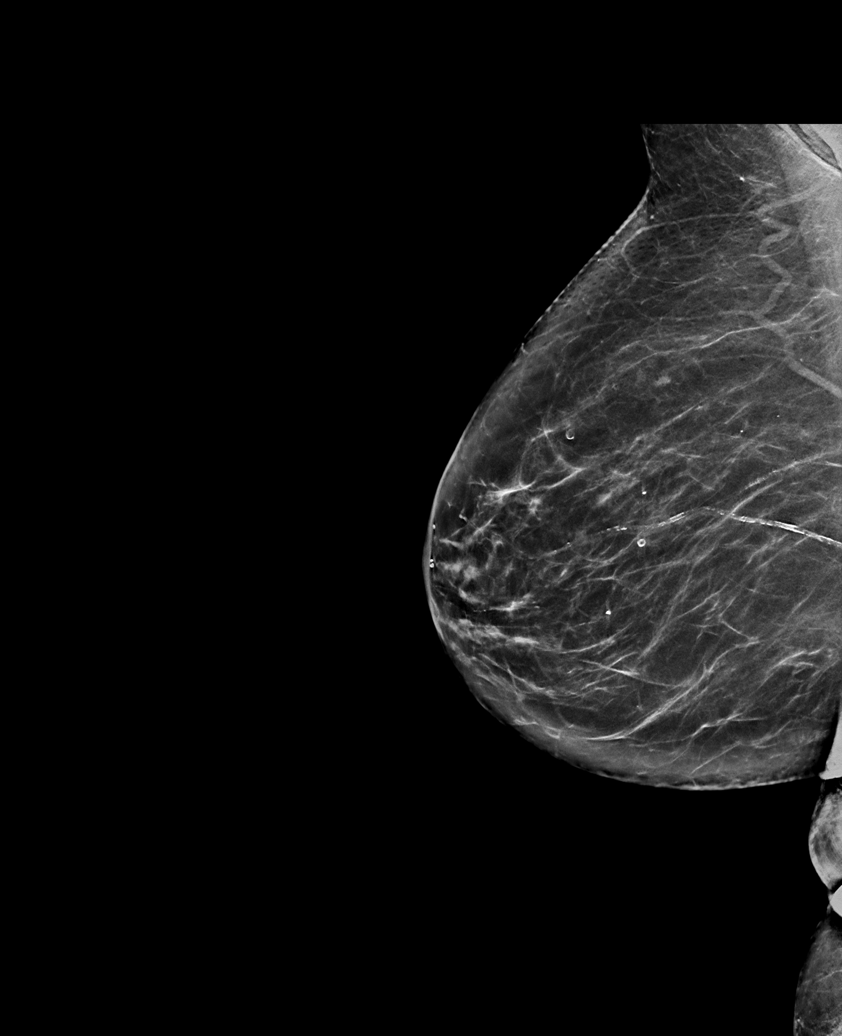

[L CC tomo · tomo slice 31/62.0]
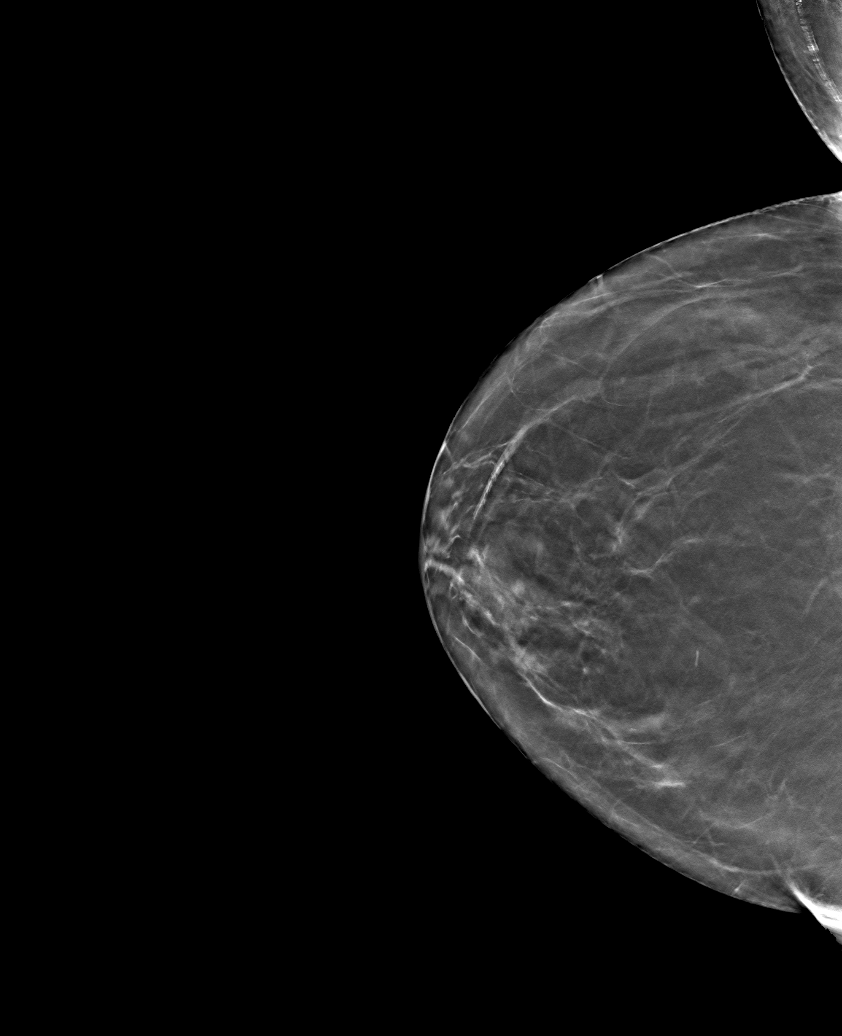

[6 of 30 positions shown; findings below may reference images not displayed]

ACR Breast Density Category b: There are scattered areas of
fibroglandular density.
FINDINGS: There are no findings suspicious for malignancy.
IMPRESSION: No mammographic evidence of malignancy. A result letter of this
screening mammogram will be mailed directly to the patient.

RECOMMENDATION:
Screening mammogram in one year. (Code:[BY])

BI-RADS CATEGORY  1: Negative.

## 2021-02-27 DIAGNOSIS — Z96641 Presence of right artificial hip joint: Secondary | ICD-10-CM | POA: Diagnosis not present

## 2021-02-27 DIAGNOSIS — M1611 Unilateral primary osteoarthritis, right hip: Secondary | ICD-10-CM | POA: Diagnosis not present

## 2021-03-06 DIAGNOSIS — M1611 Unilateral primary osteoarthritis, right hip: Secondary | ICD-10-CM | POA: Diagnosis not present

## 2021-03-06 DIAGNOSIS — Z96641 Presence of right artificial hip joint: Secondary | ICD-10-CM | POA: Diagnosis not present

## 2021-03-13 DIAGNOSIS — M1611 Unilateral primary osteoarthritis, right hip: Secondary | ICD-10-CM | POA: Diagnosis not present

## 2021-03-13 DIAGNOSIS — Z96641 Presence of right artificial hip joint: Secondary | ICD-10-CM | POA: Diagnosis not present

## 2021-03-16 DIAGNOSIS — H0012 Chalazion right lower eyelid: Secondary | ICD-10-CM | POA: Diagnosis not present

## 2021-03-20 DIAGNOSIS — Z96641 Presence of right artificial hip joint: Secondary | ICD-10-CM | POA: Diagnosis not present

## 2021-03-22 DIAGNOSIS — Z96641 Presence of right artificial hip joint: Secondary | ICD-10-CM | POA: Insufficient documentation

## 2021-04-11 DIAGNOSIS — H02882 Meibomian gland dysfunction right lower eyelid: Secondary | ICD-10-CM | POA: Diagnosis not present

## 2021-04-11 DIAGNOSIS — H02881 Meibomian gland dysfunction right upper eyelid: Secondary | ICD-10-CM | POA: Diagnosis not present

## 2021-04-13 DIAGNOSIS — M47812 Spondylosis without myelopathy or radiculopathy, cervical region: Secondary | ICD-10-CM | POA: Diagnosis not present

## 2021-04-13 DIAGNOSIS — M961 Postlaminectomy syndrome, not elsewhere classified: Secondary | ICD-10-CM | POA: Diagnosis not present

## 2021-04-20 ENCOUNTER — Ambulatory Visit: Payer: Self-pay

## 2021-04-20 NOTE — Telephone Encounter (Signed)
Scheduled patient appt with Sarah Todd on 04/26/21 at 1:20PM. KW

## 2021-04-20 NOTE — Telephone Encounter (Signed)
Summary: medication   Patient states medication oxbutyain 10 mg,giving for frequent urination isnt working, and wants to see if she can try something else. Please call back      Reason for Disposition  Has to get out of bed to urinate > 2 times a night (i.e., nocturia)  Answer Assessment - Initial Assessment Questions 1. SYMPTOM: "What's the main symptom you're concerned about?" (e.g., frequency, incontinence)     Frequency 2. ONSET: "When did the  frequency  start?"     2 years 3. PAIN: "Is there any pain?" If Yes, ask: "How bad is it?" (Scale: 1-10; mild, moderate, severe)     No 4. CAUSE: "What do you think is causing the symptoms?"     Unsure 5. OTHER SYMPTOMS: "Do you have any other symptoms?" (e.g., fever, flank pain, blood in urine, pain with urination)     No 6. PREGNANCY: "Is there any chance you are pregnant?" "When was your last menstrual period?"     No  Protocols used: Urinary Symptoms-A-AH

## 2021-04-20 NOTE — Telephone Encounter (Signed)
Pt. Reports she has had urinary frequency for "a couple of years." Was on oxybutynin and it did not help and she stopped it. Has an appointment in October, but would like to be seen sooner for this issue or if possible, have another medication sent to her pharmacy. Please advise pt.

## 2021-05-01 ENCOUNTER — Other Ambulatory Visit: Payer: Self-pay | Admitting: Physician Assistant

## 2021-05-10 ENCOUNTER — Ambulatory Visit: Payer: PPO | Admitting: Family Medicine

## 2021-05-17 ENCOUNTER — Ambulatory Visit: Payer: PPO | Admitting: Family Medicine

## 2021-06-04 ENCOUNTER — Ambulatory Visit: Payer: Self-pay | Admitting: Family Medicine

## 2021-06-22 DIAGNOSIS — Z96649 Presence of unspecified artificial hip joint: Secondary | ICD-10-CM | POA: Insufficient documentation

## 2021-06-22 DIAGNOSIS — Z96641 Presence of right artificial hip joint: Secondary | ICD-10-CM | POA: Diagnosis not present

## 2021-06-22 DIAGNOSIS — M545 Low back pain, unspecified: Secondary | ICD-10-CM | POA: Diagnosis not present

## 2021-06-26 ENCOUNTER — Other Ambulatory Visit: Payer: Self-pay

## 2021-06-26 ENCOUNTER — Encounter: Payer: Self-pay | Admitting: Family Medicine

## 2021-06-26 ENCOUNTER — Ambulatory Visit (INDEPENDENT_AMBULATORY_CARE_PROVIDER_SITE_OTHER): Payer: PPO | Admitting: Family Medicine

## 2021-06-26 VITALS — BP 140/86 | HR 80 | Temp 97.2°F | Resp 16 | Ht 62.0 in | Wt 153.0 lb

## 2021-06-26 DIAGNOSIS — R0609 Other forms of dyspnea: Secondary | ICD-10-CM | POA: Diagnosis not present

## 2021-06-26 DIAGNOSIS — Z96641 Presence of right artificial hip joint: Secondary | ICD-10-CM

## 2021-06-26 DIAGNOSIS — N3941 Urge incontinence: Secondary | ICD-10-CM

## 2021-06-26 DIAGNOSIS — R5382 Chronic fatigue, unspecified: Secondary | ICD-10-CM | POA: Insufficient documentation

## 2021-06-26 DIAGNOSIS — R5383 Other fatigue: Secondary | ICD-10-CM | POA: Insufficient documentation

## 2021-06-26 MED ORDER — MIRABEGRON ER 25 MG PO TB24: 25.0000 mg | ORAL_TABLET | Freq: Every day | ORAL | 3 refills | Status: DC

## 2021-06-26 NOTE — Assessment & Plan Note (Signed)
New problem for the last 6 months since total hip replacement Does seem to be an exercise intolerance Could be related to deconditioning after her surgery Concerned about her dyspnea on exertion and how this could be an anginal equivalent if it is not due to deconditioning See plan below for that Check labs for possible underlying etiologies of fatigue

## 2021-06-26 NOTE — Assessment & Plan Note (Signed)
Being managed by Ortho Was given a 6-day steroid taper that is making her feel great, but has not changed her DOE Discussed that she does not want to take this medication chronically due to possible side effects

## 2021-06-26 NOTE — Assessment & Plan Note (Signed)
Minimal improvement with oxybutynin Trial of Myrbetriq instead

## 2021-06-26 NOTE — Assessment & Plan Note (Signed)
New problem x6 months Not improving as she recovers from her total hip replacement Normal exam today Referral to cardiology for further evaluation

## 2021-06-26 NOTE — Progress Notes (Signed)
Established patient visit   Patient: Sarah Todd   DOB: 1946/06/07   76 y.o. Female  MRN: [id number] Visit Date: 06/26/2021  Today's healthcare provider: Lavon Paganini, MD   Chief Complaint  Patient presents with   Fatigue   Subjective    HPI  Fatigue  She reports recurrent fatigue which she describes as a lack of energy, feeling exhausted, and feeling weak. It began a few months ago after right hip surgery in April, and occurs all the time and every day. It is described as moderate and staying constant. She has started new medications around the time the fatigue started. Prednisone 4 mg taper dose. Patient reports some swelling on right hip area.  Associated symptoms: Yes arthralgias No bleeding  No melena No chest discomfort  Yes heart palpitations Yes heart racing   Yes dyspnea No feeling depressed  No feeling anxious or under stress No fevers  No loss of appetite No nausea  No vomiting No sleeping problems    Worn out so easily and napping more than usual.  +DOE with palpitations - better after rest.  Felt like she had more energy with steroid taper. On day 4/6 currently.  Does not feel like oxybutinin is helpful. Wearing depends now for urinary incontinence.  Wt Readings from Last 3 Encounters:  06/26/21 153 lb (69.4 kg)  12/06/20 149 lb 14.6 oz (68 kg)  12/01/20 150 lb (68 kg)    Lab Results  Component Value Date   WBC 12.1 (H) 12/07/2020   HGB 8.9 (L) 12/07/2020   HCT 26.0 (L) 12/07/2020   MCV 92.9 12/07/2020   PLT 207 12/07/2020   Lab Results  Component Value Date   TSH 2.820 12/18/2017   Lab Results  Component Value Date   NA 130 (L) 12/07/2020   K 4.4 12/07/2020   CO2 25 12/07/2020   BUN 20 12/07/2020   CREATININE 0.68 12/07/2020   CALCIUM 8.5 (L) 12/07/2020   GLUCOSE 137 (H) 12/07/2020     ---------------------------------------------------------------------------------------------------     Medications: Outpatient  Medications Prior to Visit  Medication Sig   acetaminophen (TYLENOL) 650 MG CR tablet Take 2,600 mg by mouth daily as needed for pain.   aspirin 81 MG chewable tablet Chew 1 tablet (81 mg total) by mouth 2 (two) times daily.   Calcium Carbonate-Vitamin D (CALCIUM 600+D PO) Take 1 capsule by mouth daily.   Cholecalciferol (VITAMIN D) 50 MCG (2000 UT) tablet Take 2,000 Units by mouth daily.   Coenzyme Q10 (COQ-10) 100 MG capsule Take 1 capsule (100 mg total) by mouth daily.   diphenhydrAMINE HCl, Sleep, (ZZZQUIL) 25 MG CAPS Take 50 mg by mouth at bedtime.   docusate sodium (COLACE) 100 MG capsule Take 1 capsule (100 mg total) by mouth 2 (two) times daily.   estradiol (ESTRACE) 1 MG tablet Take 0.5 mg by mouth daily.   gabapentin (NEURONTIN) 600 MG tablet Take 600-900 mg by mouth See admin instructions. Take 600 mg by mouth in the morning and 900 mg at night   hydrocortisone 2.5 % cream Apply 1 application topically 2 (two) times daily as needed (irritation).   losartan (COZAAR) 50 MG tablet Take 1 tablet (50 mg total) by mouth every morning.   methylPREDNISolone (MEDROL DOSEPAK) 4 MG TBPK tablet Medrol (Pak) 4 mg tablets in a dose pack  TAKE BY ORAL ROUTE AS DIRECTED   Multiple Vitamin (MULTIVITAMIN) capsule Take 1 capsule by mouth daily.    Omega-3  Fatty Acids (FISH OIL) 1000 MG CAPS Take 1,000-2,000 mg by mouth See admin instructions. Take 1000 mg in the morning and 2000 mg at night   Probiotic Product (PROBIOTIC PO) Take 1 capsule by mouth daily.   rosuvastatin (CRESTOR) 5 MG tablet Take 1 tablet (5 mg total) by mouth daily.   triamcinolone (KENALOG) 0.1 % Apply 1 application topically 2 (two) times daily. (Patient taking differently: Apply 1 application topically 2 (two) times daily as needed (rash).)   vitamin C (ASCORBIC ACID) 500 MG tablet Take 500 mg by mouth daily.    [DISCONTINUED] HYDROcodone-acetaminophen (NORCO/VICODIN) 5-325 MG tablet Take 1 tablet by mouth every 4 (four) hours as  needed for moderate pain (pain score 4-6). (Patient not taking: Reported on 06/26/2021)   [DISCONTINUED] methocarbamol (ROBAXIN) 500 MG tablet Take 1 tablet (500 mg total) by mouth every 6 (six) hours as needed for muscle spasms. (Patient not taking: Reported on 06/26/2021)   No facility-administered medications prior to visit.    Review of Systems  Constitutional:  Positive for activity change and fatigue.  Respiratory:  Positive for shortness of breath. Negative for chest tightness.   Cardiovascular:  Positive for palpitations. Negative for chest pain and leg swelling.  Musculoskeletal:  Positive for arthralgias, joint swelling and myalgias.  Psychiatric/Behavioral:  Positive for sleep disturbance. The patient is nervous/anxious.    Last CBC Lab Results  Component Value Date   WBC 12.1 (H) 12/07/2020   HGB 8.9 (L) 12/07/2020   HCT 26.0 (L) 12/07/2020   MCV 92.9 12/07/2020   MCH 31.8 12/07/2020   RDW 12.2 12/07/2020   PLT 207 35/36/1443   Last metabolic panel Lab Results  Component Value Date   GLUCOSE 137 (H) 12/07/2020   NA 130 (L) 12/07/2020   K 4.4 12/07/2020   CL 98 12/07/2020   CO2 25 12/07/2020   BUN 20 12/07/2020   CREATININE 0.68 12/07/2020   GFRNONAA >60 12/07/2020   CALCIUM 8.5 (L) 12/07/2020   PROT 7.2 11/27/2020   ALBUMIN 4.7 11/27/2020   LABGLOB 2.5 11/27/2020   AGRATIO 1.9 11/27/2020   BILITOT 0.3 11/27/2020   ALKPHOS 60 11/27/2020   AST 23 11/27/2020   ALT 21 11/27/2020   ANIONGAP 7 12/07/2020   Last lipids Lab Results  Component Value Date   CHOL 236 (H) 11/27/2020   HDL 72 11/27/2020   LDLCALC 136 (H) 11/27/2020   TRIG 158 (H) 11/27/2020   CHOLHDL 3.3 11/27/2020   Last hemoglobin A1c Lab Results  Component Value Date   HGBA1C 5.4 12/18/2017   Last thyroid functions Lab Results  Component Value Date   TSH 2.820 12/18/2017       Objective    BP 140/86   Pulse 80   Temp (!) 97.2 F (36.2 C) (Temporal)   Resp 16   Ht 5\' 2"  (1.575  m)   Wt 153 lb (69.4 kg)   SpO2 98%   BMI 27.98 kg/m  BP Readings from Last 3 Encounters:  06/26/21 140/86  12/07/20 124/63  12/01/20 (!) 150/84   Wt Readings from Last 3 Encounters:  06/26/21 153 lb (69.4 kg)  12/06/20 149 lb 14.6 oz (68 kg)  12/01/20 150 lb (68 kg)      Physical Exam Vitals reviewed.  Constitutional:      General: She is not in acute distress.    Appearance: Normal appearance. She is well-developed. She is not diaphoretic.  HENT:     Head: Normocephalic and atraumatic.  Eyes:  General: No scleral icterus.    Conjunctiva/sclera: Conjunctivae normal.  Neck:     Thyroid: No thyromegaly.  Cardiovascular:     Rate and Rhythm: Normal rate and regular rhythm.     Pulses: Normal pulses.     Heart sounds: Normal heart sounds. No murmur heard. Pulmonary:     Effort: Pulmonary effort is normal. No respiratory distress.     Breath sounds: Normal breath sounds. No wheezing, rhonchi or rales.  Musculoskeletal:     Cervical back: Neck supple.     Right lower leg: No edema.     Left lower leg: No edema.  Lymphadenopathy:     Cervical: No cervical adenopathy.  Skin:    General: Skin is warm and dry.     Findings: No rash.  Neurological:     Mental Status: She is alert and oriented to person, place, and time. Mental status is at baseline.     Gait: Gait abnormal (antalgic).  Psychiatric:        Mood and Affect: Mood normal.        Behavior: Behavior normal.      No results found for any visits on 06/26/21.  Assessment & Plan     Problem List Items Addressed This Visit       Other   Urge incontinence of urine    Minimal improvement with oxybutynin Trial of Myrbetriq instead      Relevant Medications   mirabegron ER (MYRBETRIQ) 25 MG TB24 tablet   Status post total hip replacement, right    Being managed by Ortho Was given a 6-day steroid taper that is making her feel great, but has not changed her DOE Discussed that she does not want to take  this medication chronically due to possible side effects      DOE (dyspnea on exertion)    New problem x6 months Not improving as she recovers from her total hip replacement Normal exam today Referral to cardiology for further evaluation      Relevant Orders   Ambulatory referral to Cardiology   Chronic fatigue - Primary    New problem for the last 6 months since total hip replacement Does seem to be an exercise intolerance Could be related to deconditioning after her surgery Concerned about her dyspnea on exertion and how this could be an anginal equivalent if it is not due to deconditioning See plan below for that Check labs for possible underlying etiologies of fatigue      Relevant Orders   Comprehensive metabolic panel   CBC   TSH   VITAMIN D 25 Hydroxy (Vit-D Deficiency, Fractures)   B12     Return in about 2 months (around 08/26/2021) for as scheduled.      I, Lavon Paganini, MD, have reviewed all documentation for this visit. The documentation on 06/26/21 for the exam, diagnosis, procedures, and orders are all accurate and complete.   Telford Archambeau, Dionne Bucy, MD, MPH [facility]

## 2021-06-27 LAB — COMPREHENSIVE METABOLIC PANEL
ALT: 19 IU/L (ref 0–32)
AST: 22 IU/L (ref 0–40)
Albumin/Globulin Ratio: 2.1 (ref 1.2–2.2)
Albumin: 5.3 g/dL — ABNORMAL HIGH (ref 3.7–4.7)
Alkaline Phosphatase: 52 IU/L (ref 44–121)
BUN/Creatinine Ratio: 25 (ref 12–28)
BUN: 16 mg/dL (ref 8–27)
Bilirubin Total: 0.2 mg/dL (ref 0.0–1.2)
CO2: 23 mmol/L (ref 20–29)
Calcium: 10.9 mg/dL — ABNORMAL HIGH (ref 8.7–10.3)
Chloride: 98 mmol/L (ref 96–106)
Creatinine, Ser: 0.64 mg/dL (ref 0.57–1.00)
Globulin, Total: 2.5 g/dL (ref 1.5–4.5)
Glucose: 93 mg/dL (ref 70–99)
Potassium: 5 mmol/L (ref 3.5–5.2)
Sodium: 139 mmol/L (ref 134–144)
Total Protein: 7.8 g/dL (ref 6.0–8.5)
eGFR: 91 mL/min/{1.73_m2} (ref >59)

## 2021-06-27 LAB — CBC
Hematocrit: 38.8 % (ref 34.0–46.6)
Hemoglobin: 13.4 g/dL (ref 11.1–15.9)
MCH: 32.2 pg (ref 26.6–33.0)
MCHC: 34.5 g/dL (ref 31.5–35.7)
MCV: 93 fL (ref 79–97)
Platelets: 320 10*3/uL (ref 150–450)
RBC: 4.16 x10E6/uL (ref 3.77–5.28)
RDW: 12.1 % (ref 11.7–15.4)
WBC: 8.6 10*3/uL (ref 3.4–10.8)

## 2021-06-27 LAB — VITAMIN B12: Vitamin B-12: 516 pg/mL (ref 232–1245)

## 2021-06-27 LAB — VITAMIN D 25 HYDROXY (VIT D DEFICIENCY, FRACTURES): Vit D, 25-Hydroxy: 39.5 ng/mL (ref 30.0–100.0)

## 2021-06-27 LAB — TSH: TSH: 4.48 u[IU]/mL (ref 0.450–4.500)

## 2021-06-28 ENCOUNTER — Telehealth: Payer: Self-pay

## 2021-06-28 DIAGNOSIS — I10 Essential (primary) hypertension: Secondary | ICD-10-CM

## 2021-06-28 NOTE — Telephone Encounter (Signed)
-----   Message from Virginia Crews, MD sent at 06/28/2021  1:13 PM EDT ----- Normal/stable labs, except for high calcium. Hold Calcium supplement (including tums) and recheck CMP in 1 month.

## 2021-06-28 NOTE — Telephone Encounter (Signed)
Copied from [place] [id number]. Topic: General - Inquiry >> Jun 28, 2021 11:25 AM Oneta Rack wrote: Reason for CRM:  patient inquiring about most recent labs results, patient was able to view on mychart but would like clarity

## 2021-06-28 NOTE — Telephone Encounter (Signed)
Mychart result message sent. She should wait 2-3 days for provider to review labs before calling in the future.

## 2021-06-29 NOTE — Telephone Encounter (Signed)
Patient advised as below.  

## 2021-07-10 DIAGNOSIS — Z96641 Presence of right artificial hip joint: Secondary | ICD-10-CM | POA: Diagnosis not present

## 2021-07-10 DIAGNOSIS — M545 Low back pain, unspecified: Secondary | ICD-10-CM | POA: Diagnosis not present

## 2021-07-18 ENCOUNTER — Encounter: Payer: Self-pay | Admitting: Internal Medicine

## 2021-07-18 ENCOUNTER — Other Ambulatory Visit: Payer: Self-pay

## 2021-07-18 ENCOUNTER — Ambulatory Visit: Payer: PPO | Admitting: Internal Medicine

## 2021-07-18 VITALS — BP 150/80 | HR 111 | Ht 62.0 in | Wt 154.0 lb

## 2021-07-18 DIAGNOSIS — R0609 Other forms of dyspnea: Secondary | ICD-10-CM

## 2021-07-18 DIAGNOSIS — E782 Mixed hyperlipidemia: Secondary | ICD-10-CM | POA: Diagnosis not present

## 2021-07-18 DIAGNOSIS — R5383 Other fatigue: Secondary | ICD-10-CM | POA: Diagnosis not present

## 2021-07-18 DIAGNOSIS — I10 Essential (primary) hypertension: Secondary | ICD-10-CM | POA: Diagnosis not present

## 2021-07-18 MED ORDER — METOPROLOL SUCCINATE ER 25 MG PO TB24: 25.0000 mg | ORAL_TABLET | Freq: Every day | ORAL | 1 refills | Status: DC

## 2021-07-18 NOTE — Patient Instructions (Signed)
Medication Instructions:   Your physician has recommended you make the following change in your medication:   START Metoprolol Succinate 25 mg daily   *If you need a refill on your cardiac medications before your next appointment, please call your pharmacy*   Lab Work:  None ordered  Testing/Procedures:  Your physician has requested that you have an echocardiogram (within the next week.) Echocardiography is a painless test that uses sound waves to create images of your heart. It provides your doctor with information about the size and shape of your heart and how well your heart's chambers and valves are working. This procedure takes approximately one hour. There are no restrictions for this procedure.   Follow-Up: At [facility], you and your health needs are our priority.  As part of our continuing mission to provide you with exceptional heart care, we have created designated Provider Care Teams.  These Care Teams include your primary Cardiologist (physician) and Advanced Practice Providers (APPs -  Physician Assistants and Nurse Practitioners) who all work together to provide you with the care you need, when you need it.  We recommend signing up for the patient portal called "MyChart".  Sign up information is provided on this After Visit Summary.  MyChart is used to connect with patients for Virtual Visits (Telemedicine).  Patients are able to view lab/test results, encounter notes, upcoming appointments, etc.  Non-urgent messages can be sent to your provider as well.   To learn more about what you can do with MyChart, go to [email or web address].    Your next appointment:    Follow up after echo  The format for your next appointment:   In Person  Provider:   You may see Dr. Harrell Gave End or one of the following Advanced Practice Providers on your designated Care Team:   Murray Hodgkins, NP Christell Faith, PA-C Cadence Kathlen Mody, [place]

## 2021-07-18 NOTE — Progress Notes (Signed)
New Outpatient Visit Date: 07/18/2021  Referring Provider: Virginia Crews, [address] [place],  [place] [address]  Chief Complaint: Fatigue and shortness of breath  HPI:  Sarah Todd is a 75 y.o. female who is being seen today for the evaluation of dyspnea on exertion and fatigue at the request of Dr. Brita Romp. She has a history of hypertension, hyperlipidemia, GERD, varicose veins, and arthritis.  Ms. Sarah Todd reports that she has had progressive fatigue and exertional dyspnea shortly after her right hip surgery in April.  She now has a hard time doing even light tasks such as cleaning in the bathroom or making her bed due to shortness of breath.  She also feels like her heart races, especially when she is lying down.  She has not had any chest pain, orthopnea, or leg edema.  She denies a history of prior blood clots and was treated with low-dose aspirin following her hip surgery.  She denies a history of heart disease and prior heart testing.  --------------------------------------------------------------------------------------------------  Cardiovascular History & Procedures: Cardiovascular Problems: Dyspnea on exertion  Risk Factors: Hypertension and age > 19  Cath/PCI: None  CV Surgery: None  EP Procedures and Devices: None  Non-Invasive Evaluation(s): None  Recent CV Pertinent Labs: Lab Results  Component Value Date   CHOL 236 (H) 11/27/2020   HDL 72 11/27/2020   LDLCALC 136 (H) 11/27/2020   TRIG 158 (H) 11/27/2020   CHOLHDL 3.3 11/27/2020   INR 1.0 11/28/2020   K 5.0 06/26/2021   BUN 16 06/26/2021   CREATININE 0.64 06/26/2021    --------------------------------------------------------------------------------------------------  Past Medical History:  Diagnosis Date   Allergy    Arthritis    hands   Family history of adverse reaction to anesthesia    sister but not sure of what happened   GERD (gastroesophageal reflux disease)     Hypertension    Osteoporosis    Varicose veins    Mild    Past Surgical History:  Procedure Laterality Date   ABDOMINAL HYSTERECTOMY     ANTERIOR CERVICAL DECOMP/DISCECTOMY FUSION N/A 07/21/2019   Procedure: ANTERIOR CERVICAL DECOMPRESSION/DISCECTOMY FUSION CERVICAL THREE- CERVICAL FOUR, CERVICAL FOUR- CERVICAL FIVE, CERVICAL FIVE- CERVICAL SIX;  Surgeon: Jovita Gamma, MD;  Location: [place];  Service: Neurosurgery;  Laterality: N/A;  ANTERIOR CERVICAL DECOMPRESSION/DISCECTOMY FUSION CERVICAL 3- CERVICAL 4, CERVICAL 4- CERVICAL 5, CERVICAL 5- CERVICAL 6   BACK SURGERY     cervical and lumbar     COLONOSCOPY N/A 01/05/2015   Procedure: COLONOSCOPY;  Surgeon: Lucilla Lame, MD;  Location: [place];  Service: Gastroenterology;  Laterality: N/A;   COLONOSCOPY WITH PROPOFOL N/A 01/25/2020   Procedure: COLONOSCOPY WITH PROPOFOL;  Surgeon: Lucilla Lame, MD;  [facility];  Service: Endoscopy;  Laterality: N/A;   DILATION AND CURETTAGE OF UTERUS     FRACTURE SURGERY     clavicle right   TOOTH EXTRACTION     TOTAL HIP ARTHROPLASTY Right 12/06/2020   Procedure: TOTAL HIP ARTHROPLASTY ANTERIOR APPROACH;  Surgeon: Lovell Sheehan, MD;  Location: [facility];  Service: Orthopedics;  Laterality: Right;   TUBAL LIGATION      Current Meds  Medication Sig   acetaminophen (TYLENOL) 650 MG CR tablet Take 2,600 mg by mouth daily as needed for pain.   aspirin 81 MG chewable tablet Chew 81 mg by mouth daily.   Cholecalciferol (VITAMIN D) 50 MCG (2000 UT) tablet Take 2,000 Units by mouth daily.   Coenzyme Q10 (COQ-10)  100 MG capsule Take 1 capsule (100 mg total) by mouth daily.   estradiol (ESTRACE) 1 MG tablet Take 0.5 mg by mouth daily.   gabapentin (NEURONTIN) 600 MG tablet Take 600-900 mg by mouth See admin instructions. Take 600 mg by mouth in the morning and 900 mg at night   hydrocortisone 2.5 % cream Apply 1 application topically 2 (two) times daily as needed (irritation).    losartan (COZAAR) 50 MG tablet Take 1 tablet (50 mg total) by mouth every morning.   mirabegron ER (MYRBETRIQ) 25 MG TB24 tablet Take 1 tablet (25 mg total) by mouth daily.   Multiple Vitamin (MULTIVITAMIN) capsule Take 1 capsule by mouth daily.    Omega-3 Fatty Acids (FISH OIL) 1000 MG CAPS Take 1,000-2,000 mg by mouth See admin instructions. Take 1000 mg in the morning and 2000 mg at night   Probiotic Product (PROBIOTIC PO) Take 1 capsule by mouth daily.   rosuvastatin (CRESTOR) 5 MG tablet Take 1 tablet (5 mg total) by mouth daily.   triamcinolone cream (KENALOG) 0.1 % Apply 1 application topically 2 (two) times daily as needed.   vitamin C (ASCORBIC ACID) 500 MG tablet Take 500 mg by mouth daily.     Allergies: Lisinopril, Shrimp [shellfish allergy], Aspercreme [trolamine salicylate], and Lidocaine  Social History   Tobacco Use   Smoking status: Never   Smokeless tobacco: Never  Vaping Use   Vaping Use: Never used  Substance Use Topics   Alcohol use: No   Drug use: No    Family History  Problem Relation Age of Onset   Cancer Mother        colon cancer   Alzheimer's disease Father    Heart disease Father    Hypertension Father    Cancer Sister        uterine   Breast cancer Neg Hx     Review of Systems: A 12-system review of systems was performed and was negative except as noted in the HPI.  --------------------------------------------------------------------------------------------------  Physical Exam: BP (!) 150/80 (BP Location: Right Arm, Patient Position: Sitting, Cuff Size: Normal)   Pulse (!) 111   Ht 5\' 2"  (1.575 m)   Wt 154 lb (69.9 kg)   SpO2 98%   BMI 28.17 kg/m   General: NAD HEENT: No conjunctival pallor or scleral icterus. Facemask in place. Neck: Supple without lymphadenopathy, thyromegaly, JVD, or HJR. No carotid bruit. Lungs: Normal work of breathing. Clear to auscultation bilaterally without wheezes or crackles. Heart: Tachycardic but regular  without murmurs, rubs, or gallops. Non-displaced PMI. Abd: Bowel sounds present. Soft, NT/ND without hepatosplenomegaly Ext: No lower extremity edema. Radial, PT, and DP pulses are 2+ bilaterally Skin: Warm and dry without rash. Neuro: CNIII-XII intact. Strength and fine-touch sensation intact in upper and lower extremities bilaterally. Psych: Normal mood and affect.  EKG: Sinus tachycardia with left bundle branch block.  Compared with prior tracing from 11/28/2020, left bundle branch block is new.  Limited bedside echo personally performed, demonstrating low normal LVEF with suggestion of septal dyskinesis likely due to LBBB.  Additional wall motion abnormality cannot be excluded due to suboptimal windows.  RV contraction appears vigorous.  No significant pericardial effusion noted.  IVC appears normal in size with phasic respiratory variation.  Lab Results  Component Value Date   WBC 8.6 06/26/2021   HGB 13.4 06/26/2021   HCT 38.8 06/26/2021   MCV 93 06/26/2021   PLT 320 06/26/2021    Lab Results  Component Value  Date   NA 139 06/26/2021   K 5.0 06/26/2021   CL 98 06/26/2021   CO2 23 06/26/2021   BUN 16 06/26/2021   CREATININE 0.64 06/26/2021   GLUCOSE 93 06/26/2021   ALT 19 06/26/2021    Lab Results  Component Value Date   CHOL 236 (H) 11/27/2020   HDL 72 11/27/2020   LDLCALC 136 (H) 11/27/2020   TRIG 158 (H) 11/27/2020   CHOLHDL 3.3 11/27/2020    --------------------------------------------------------------------------------------------------  ASSESSMENT AND PLAN: Dyspnea on exertion and fatigue: Symptoms have been present since the spring following hip surgery.  EKG today demonstrates sinus tachycardia with left bundle branch block, which is new since late March.  Tracing at that time was notable for inferior and anterior Q waves.  Sarah Todd denies a history of heart disease and prior heart testing.  I am concerned that her symptoms may be reflective of heart  failure and/or ischemic heart disease.  I have recommended that we initiate metoprolol succinate 25 mg daily and arrange for expedited echocardiogram to better assess her LV function.  Based on results, we will need to perform ischemia evaluation with noninvasive testing or catheterization.  I advised her to call 911 if she has worsening symptoms in the meantime.  She should continue aspirin, losartan, and rosuvastatin.  I think PE is less likely given the time course of her symptoms and normal RV appearance based on limited echo today.  However, if there is evidence of RV dysfunction or pulmonary hypertension on formal echo, PE will need to be excluded given that symptoms began after right hip surgery the splint.  Hypertension: Blood pressure mildly elevated today.  As above we will add low-dose metoprolol.  Continue current dose of losartan.  Hyperlipidemia: Continue rosuvastatin 5 mg daily.  Escalation will need to be considered if significant ASCVD discovered on ongoing work-up.  Follow-up: Return to clinic after completion of echocardiogram.  Nelva Bush, MD 07/18/2021 4:14 PM

## 2021-07-19 DIAGNOSIS — M47812 Spondylosis without myelopathy or radiculopathy, cervical region: Secondary | ICD-10-CM | POA: Diagnosis not present

## 2021-07-19 DIAGNOSIS — M961 Postlaminectomy syndrome, not elsewhere classified: Secondary | ICD-10-CM | POA: Diagnosis not present

## 2021-07-20 DIAGNOSIS — Z96641 Presence of right artificial hip joint: Secondary | ICD-10-CM | POA: Diagnosis not present

## 2021-07-23 ENCOUNTER — Other Ambulatory Visit: Payer: PPO

## 2021-07-25 ENCOUNTER — Ambulatory Visit: Payer: PPO | Admitting: Cardiovascular Disease

## 2021-07-30 ENCOUNTER — Ambulatory Visit: Payer: PPO | Admitting: Cardiovascular Disease

## 2021-07-30 DIAGNOSIS — I10 Essential (primary) hypertension: Secondary | ICD-10-CM | POA: Diagnosis not present

## 2021-07-31 ENCOUNTER — Ambulatory Visit: Payer: Self-pay | Admitting: *Deleted

## 2021-07-31 LAB — COMPREHENSIVE METABOLIC PANEL
ALT: 16 IU/L (ref 0–32)
AST: 21 IU/L (ref 0–40)
Albumin/Globulin Ratio: 1.7 (ref 1.2–2.2)
Albumin: 4.7 g/dL (ref 3.7–4.7)
Alkaline Phosphatase: 55 IU/L (ref 44–121)
BUN/Creatinine Ratio: 19 (ref 12–28)
BUN: 12 mg/dL (ref 8–27)
Bilirubin Total: 0.3 mg/dL (ref 0.0–1.2)
CO2: 19 mmol/L — ABNORMAL LOW (ref 20–29)
Calcium: 9.2 mg/dL (ref 8.7–10.3)
Chloride: 99 mmol/L (ref 96–106)
Creatinine, Ser: 0.62 mg/dL (ref 0.57–1.00)
Globulin, Total: 2.7 g/dL (ref 1.5–4.5)
Glucose: 97 mg/dL (ref 70–99)
Potassium: 4.6 mmol/L (ref 3.5–5.2)
Sodium: 136 mmol/L (ref 134–144)
Total Protein: 7.4 g/dL (ref 6.0–8.5)
eGFR: 92 mL/min/{1.73_m2} (ref >59)

## 2021-07-31 NOTE — Telephone Encounter (Signed)
got her labs and does not quite understand, has a couple questions re her calcium, call either [phone] or [phone]   Called patient and patient unavailable at this time. Left message for patient to call clinic back in am to review lab results at [phone]. Female answered phone reports he will give patient the message.

## 2021-08-01 DIAGNOSIS — Z96641 Presence of right artificial hip joint: Secondary | ICD-10-CM | POA: Diagnosis not present

## 2021-08-01 DIAGNOSIS — M545 Low back pain, unspecified: Secondary | ICD-10-CM | POA: Diagnosis not present

## 2021-08-01 NOTE — Telephone Encounter (Signed)
Patient would like to know if she can add calcium back into her diet like milk. Patient also wants to know what needs to be done reading the CO2 due to it being flagged. Please advise.

## 2021-08-02 NOTE — Telephone Encounter (Signed)
She can have calcium in her diet.  No need to worry about the CO2 reading.

## 2021-08-06 ENCOUNTER — Other Ambulatory Visit: Payer: Self-pay | Admitting: Family Medicine

## 2021-08-06 ENCOUNTER — Ambulatory Visit: Payer: PPO | Admitting: Cardiology

## 2021-08-06 ENCOUNTER — Other Ambulatory Visit: Payer: Self-pay | Admitting: Physician Assistant

## 2021-08-06 DIAGNOSIS — I10 Essential (primary) hypertension: Secondary | ICD-10-CM

## 2021-08-07 DIAGNOSIS — Z96641 Presence of right artificial hip joint: Secondary | ICD-10-CM | POA: Diagnosis not present

## 2021-08-07 DIAGNOSIS — M545 Low back pain, unspecified: Secondary | ICD-10-CM | POA: Diagnosis not present

## 2021-08-08 ENCOUNTER — Ambulatory Visit: Payer: PPO | Admitting: Nurse Practitioner

## 2021-08-15 NOTE — Progress Notes (Signed)
Established patient visit   Patient: Sarah Todd   DOB: [birth year]   75 y.o. Female  MRN: [id number] Visit Date: 08/16/2021  Today's healthcare provider: Lavon Paganini, MD   Chief Complaint  Patient presents with   Leg Pain    Both legs have been aching noticed some swelling in left leg for about 2 weeks  itchy behind both knee caps. Pt reports sx are getting worse.     Hypertension   Subjective    HPI HPI     Leg Pain    Additional comments: Both legs have been aching noticed some swelling in left leg for about 2 weeks  itchy behind both knee caps. Pt reports sx are getting worse.        Last edited by Virginia Crews, MD on 08/16/2021  1:55 PM.     Taking Ibuprofen and it makes it better temporarily   Hypertension, follow-up  BP Readings from Last 3 Encounters:  08/16/21 132/82  07/18/21 (!) 150/80  06/26/21 140/86   Wt Readings from Last 3 Encounters:  08/16/21 156 lb (70.8 kg)  07/18/21 154 lb (69.9 kg)  06/26/21 153 lb (69.4 kg)     She was last seen for hypertension 8 months ago.  BP at that visit was 150/84. Management since that visit includes continue current medication.  She reports excellent compliance with treatment. She is not having side effects.  She is following a Regular diet. She is not exercising.  Outside blood pressures are not being checked. Symptoms: No chest pain No chest pressure  No palpitations No syncope  No dyspnea No orthopnea  No paroxysmal nocturnal dyspnea Yes lower extremity edema   Pertinent labs: Lab Results  Component Value Date   CHOL 236 (H) 11/27/2020   HDL 72 11/27/2020   LDLCALC 136 (H) 11/27/2020   TRIG 158 (H) 11/27/2020   CHOLHDL 3.3 11/27/2020   Lab Results  Component Value Date   NA 136 07/30/2021   K 4.6 07/30/2021   CREATININE 0.62 07/30/2021   EGFR 92 07/30/2021   GLUCOSE 97 07/30/2021   TSH 4.480 06/26/2021     The 10-year ASCVD risk score (Arnett DK, et al., 2019) is:  27.7%   ---------------------------------------------------------------------------------------------------    Medications: Outpatient Medications Prior to Visit  Medication Sig   acetaminophen (TYLENOL) 650 MG CR tablet Take 2,600 mg by mouth daily as needed for pain.   aspirin 81 MG chewable tablet Chew 81 mg by mouth daily.   Cholecalciferol (VITAMIN D) 50 MCG (2000 UT) tablet Take 2,000 Units by mouth daily.   Coenzyme Q10 (COQ-10) 100 MG capsule Take 1 capsule (100 mg total) by mouth daily.   estradiol (ESTRACE) 0.5 MG tablet TAKE ONE TABLET EVERY DAY   estradiol (ESTRACE) 1 MG tablet Take 0.5 mg by mouth daily.   gabapentin (NEURONTIN) 600 MG tablet Take 600-900 mg by mouth See admin instructions. Take 600 mg by mouth in the morning and 900 mg at night   hydrocortisone 2.5 % cream Apply 1 application topically 2 (two) times daily as needed (irritation).   losartan (COZAAR) 50 MG tablet TAKE 1 TABLET BY MOUTH EVERY MORNING   metoprolol succinate (TOPROL XL) 25 MG 24 hr tablet Take 1 tablet (25 mg total) by mouth daily.   mirabegron ER (MYRBETRIQ) 25 MG TB24 tablet Take 1 tablet (25 mg total) by mouth daily.   Multiple Vitamin (MULTIVITAMIN) capsule Take 1 capsule by mouth daily.  Omega-3 Fatty Acids (FISH OIL) 1000 MG CAPS Take 1,000-2,000 mg by mouth See admin instructions. Take 1000 mg in the morning and 2000 mg at night   Probiotic Product (PROBIOTIC PO) Take 1 capsule by mouth daily.   rosuvastatin (CRESTOR) 5 MG tablet Take 1 tablet (5 mg total) by mouth daily.   triamcinolone cream (KENALOG) 0.1 % Apply 1 application topically 2 (two) times daily as needed.   vitamin C (ASCORBIC ACID) 500 MG tablet Take 500 mg by mouth daily.    No facility-administered medications prior to visit.    Review of Systems per HPI     Objective    BP 132/82 (BP Location: Right Arm, Patient Position: Sitting, Cuff Size: Normal)    Pulse 80    Temp 98.2 F (36.8 C) (Temporal)    Ht 5' 2"   (1.575 m)    Wt 156 lb (70.8 kg)    SpO2 100%    BMI 28.53 kg/m  {Show previous vital signs (optional):23777}  Physical Exam Vitals reviewed.  Constitutional:      General: She is not in acute distress.    Appearance: Normal appearance. She is well-developed. She is not diaphoretic.  HENT:     Head: Normocephalic and atraumatic.  Eyes:     General: No scleral icterus.    Conjunctiva/sclera: Conjunctivae normal.  Neck:     Thyroid: No thyromegaly.  Cardiovascular:     Rate and Rhythm: Normal rate and regular rhythm.     Pulses: Normal pulses.     Heart sounds: Normal heart sounds. No murmur heard. Pulmonary:     Effort: Pulmonary effort is normal. No respiratory distress.     Breath sounds: Normal breath sounds. No wheezing, rhonchi or rales.  Musculoskeletal:     Cervical back: Neck supple.     Right lower leg: No edema.     Left lower leg: No edema.     Comments: L Knee: Inspection with no erythema, +swelling, no obvious bony abnormalities.  Palpation with no warmth or + joint line tenderness, nopatellar tenderness or condyle tenderness. ROM normal in flexion and extension and lower leg rotation. Ligaments with solid consistent endpoints including ACL, PCL, LCL, MCL. Negative Mcmurray's and provocative meniscal tests. Non painful patellar compression. Patellar and quadriceps tendons unremarkable. Hamstring and quadriceps strength is normal.   Lymphadenopathy:     Cervical: No cervical adenopathy.  Skin:    General: Skin is warm and dry.     Findings: Rash (Small area of erythematous maculopapular rash behind both knees and area where she was applying Aspercre[place]) present.  Neurological:     Mental Status: She is alert and oriented to person, place, and time. Mental status is at baseline.  Psychiatric:        Mood and Affect: Mood normal.        Behavior: Behavior normal.      No results found for any visits on 08/16/21.  Assessment & Plan     Problem List Items  Addressed This Visit       Cardiovascular and Mediastinum   Essential hypertension - Primary    Well controlled Continue current medications Recheck metabolic panel F/u in 6 months         Musculoskeletal and Integument   Irritant contact dermatitis due to cosmetics    Likely contact Derm from [employer] Discontinue that and use OTC hydrocortisone Return precautions discussed        Other   Mixed hyperlipidemia  Reviewed last panel No changes today Recheck at next visit      Acute pain of left knee    New pain in both knees significantly worse in the left knee Notable swelling in the left knee with joint line tenderness We will get x-rays of both knees Suspect OA that may have been exacerbated by change in gait related to her hip pain Corticosteroid injection in her left knee as below today      Relevant Orders   DG Knee Complete 4 Views Left   Other Visit Diagnoses     Acute pain of right knee       Relevant Orders   DG Knee Complete 4 Views Right       INJECTION: Patient was given informed consent,. Appropriate time out was taken. Area prepped and draped in usual sterile fashion. 1 cc of depo-medrol 40 mg/ml plus 4 cc of 1% lidocaine without epinephrine was injected into the left knee using a(n) anterior medial approach. The patient tolerated the procedure well. There were no complications. Post procedure instructions were given.    Return in about 3 months (around 11/14/2021) for CPE, AWV.      I, Lavon Paganini, MD, have reviewed all documentation for this visit. The documentation on 08/16/21 for the exam, diagnosis, procedures, and orders are all accurate and complete.   Jun Osment, Dionne Bucy, MD, MPH [facility]

## 2021-08-16 ENCOUNTER — Encounter: Payer: Self-pay | Admitting: Family Medicine

## 2021-08-16 ENCOUNTER — Other Ambulatory Visit: Payer: Self-pay

## 2021-08-16 ENCOUNTER — Ambulatory Visit
Admission: RE | Admit: 2021-08-16 | Discharge: 2021-08-16 | Disposition: A | Discharge status: Home / Self Care | Payer: PPO | Source: Ambulatory Visit | Attending: Family Medicine | Admitting: Family Medicine

## 2021-08-16 ENCOUNTER — Ambulatory Visit
Admission: RE | Admit: 2021-08-16 | Discharge: 2021-08-16 | Disposition: A | Discharge status: Home / Self Care | Payer: PPO | Attending: Family Medicine | Admitting: Family Medicine

## 2021-08-16 ENCOUNTER — Ambulatory Visit (INDEPENDENT_AMBULATORY_CARE_PROVIDER_SITE_OTHER): Payer: PPO | Admitting: Family Medicine

## 2021-08-16 VITALS — BP 132/82 | HR 80 | Temp 98.2°F | Ht 62.0 in | Wt 156.0 lb

## 2021-08-16 DIAGNOSIS — E782 Mixed hyperlipidemia: Secondary | ICD-10-CM

## 2021-08-16 DIAGNOSIS — M25562 Pain in left knee: Secondary | ICD-10-CM | POA: Diagnosis not present

## 2021-08-16 DIAGNOSIS — M25561 Pain in right knee: Secondary | ICD-10-CM

## 2021-08-16 DIAGNOSIS — I10 Essential (primary) hypertension: Secondary | ICD-10-CM | POA: Diagnosis not present

## 2021-08-16 DIAGNOSIS — M7989 Other specified soft tissue disorders: Secondary | ICD-10-CM | POA: Diagnosis not present

## 2021-08-16 DIAGNOSIS — L243 Irritant contact dermatitis due to cosmetics: Secondary | ICD-10-CM | POA: Insufficient documentation

## 2021-08-16 DIAGNOSIS — M1711 Unilateral primary osteoarthritis, right knee: Secondary | ICD-10-CM | POA: Diagnosis not present

## 2021-08-16 IMAGING — CR DG KNEE COMPLETE 4+V*R*
1 series · 4 of 4 positions shown · non-contrast
Comparison: None.

CLINICAL DATA: Pain.  No swelling.

EXAM:
RIGHT KNEE - COMPLETE 4+ VIEW

[Series 1: dg knee complete 4 views right · 0.14mm/px · 4 of 4 slices shown]
[im 1/4]
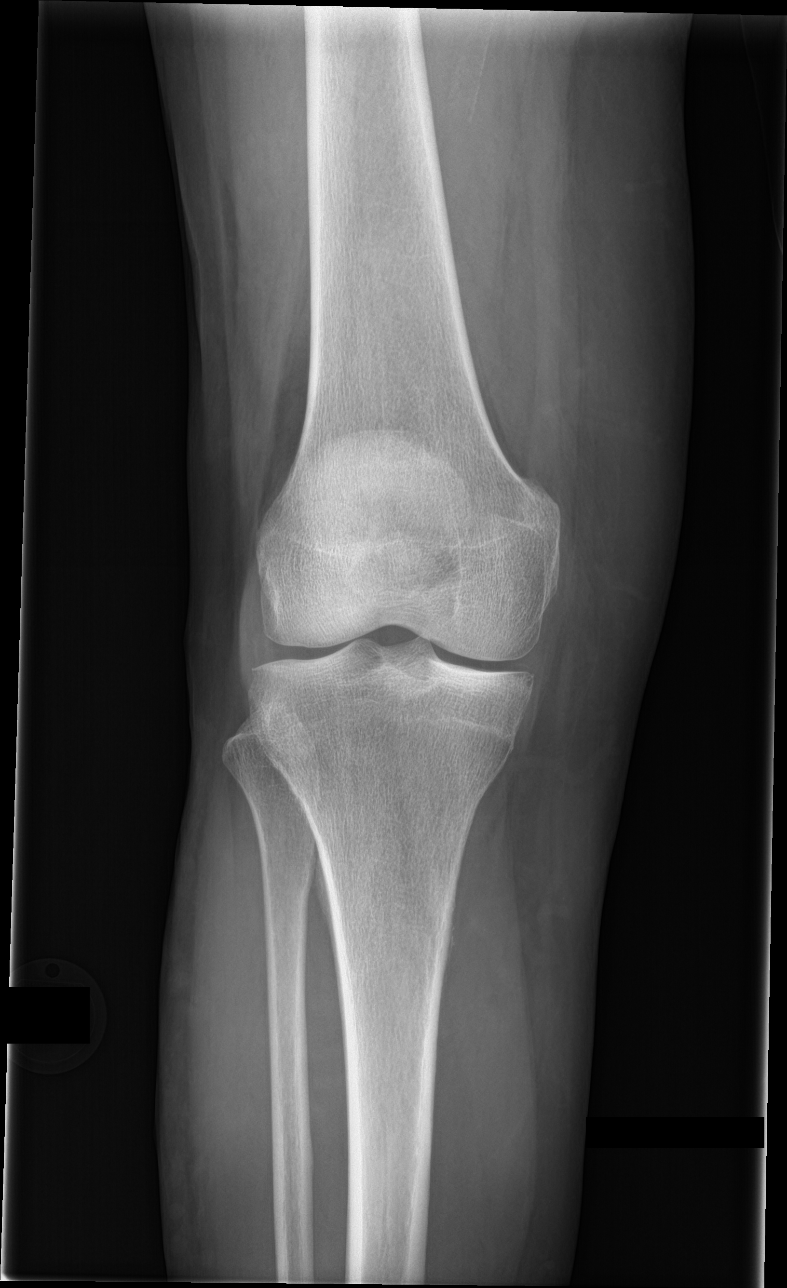
[im 2/4]
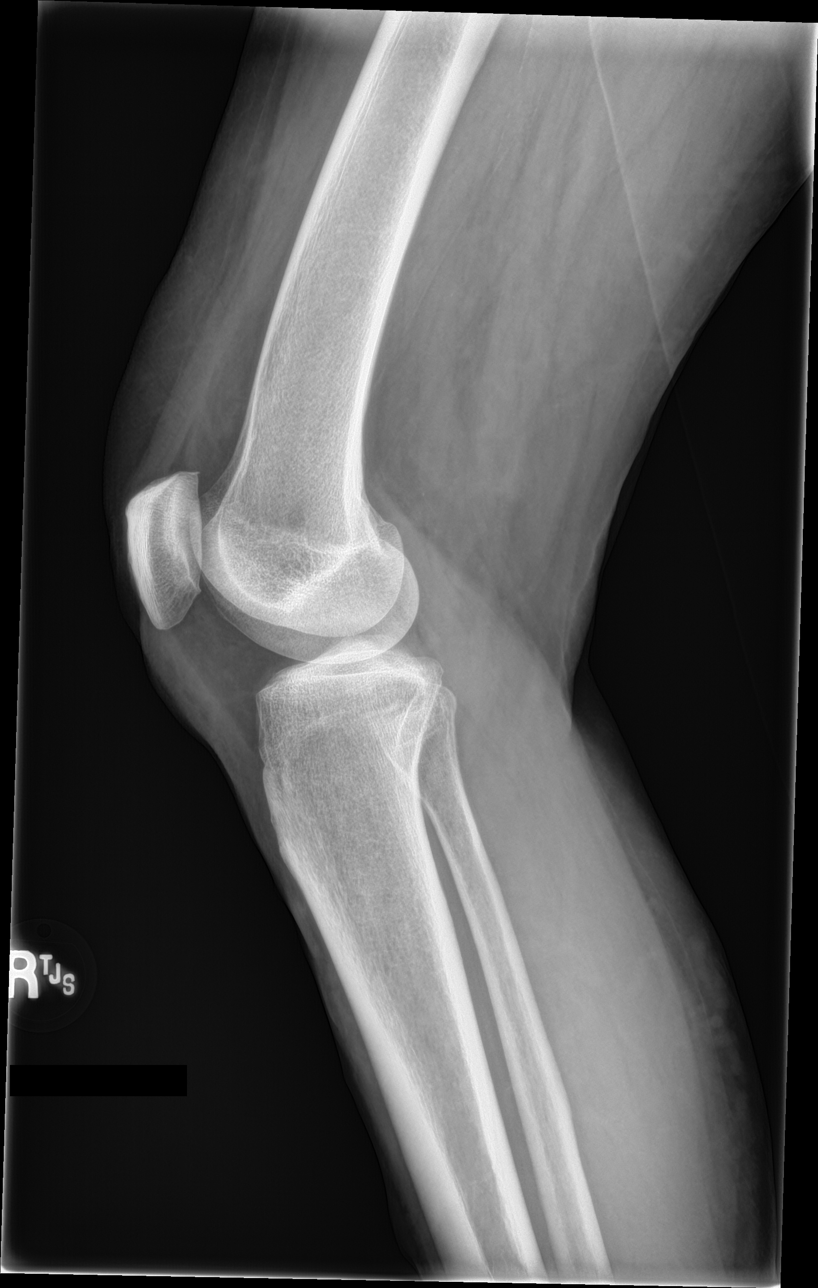
[im 3/4]
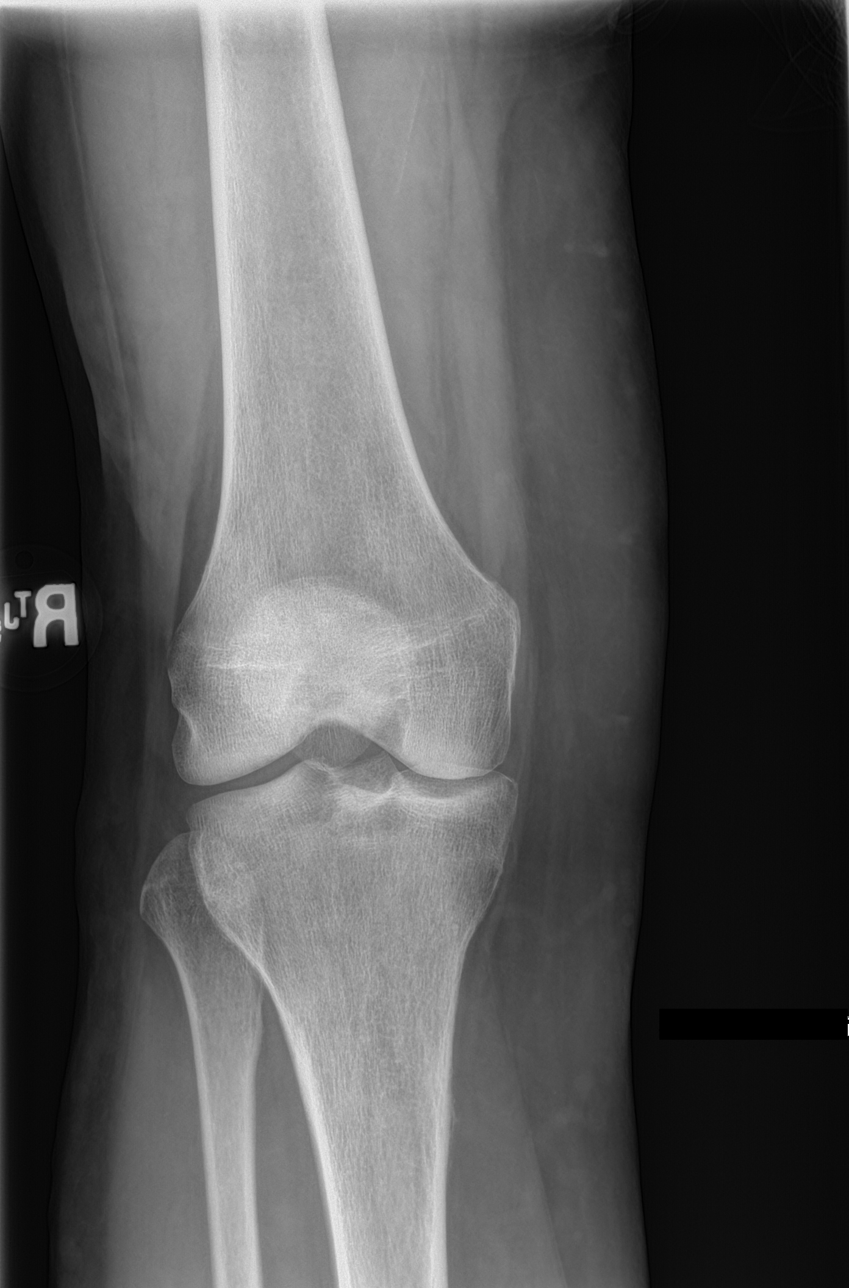
[im 4/4]
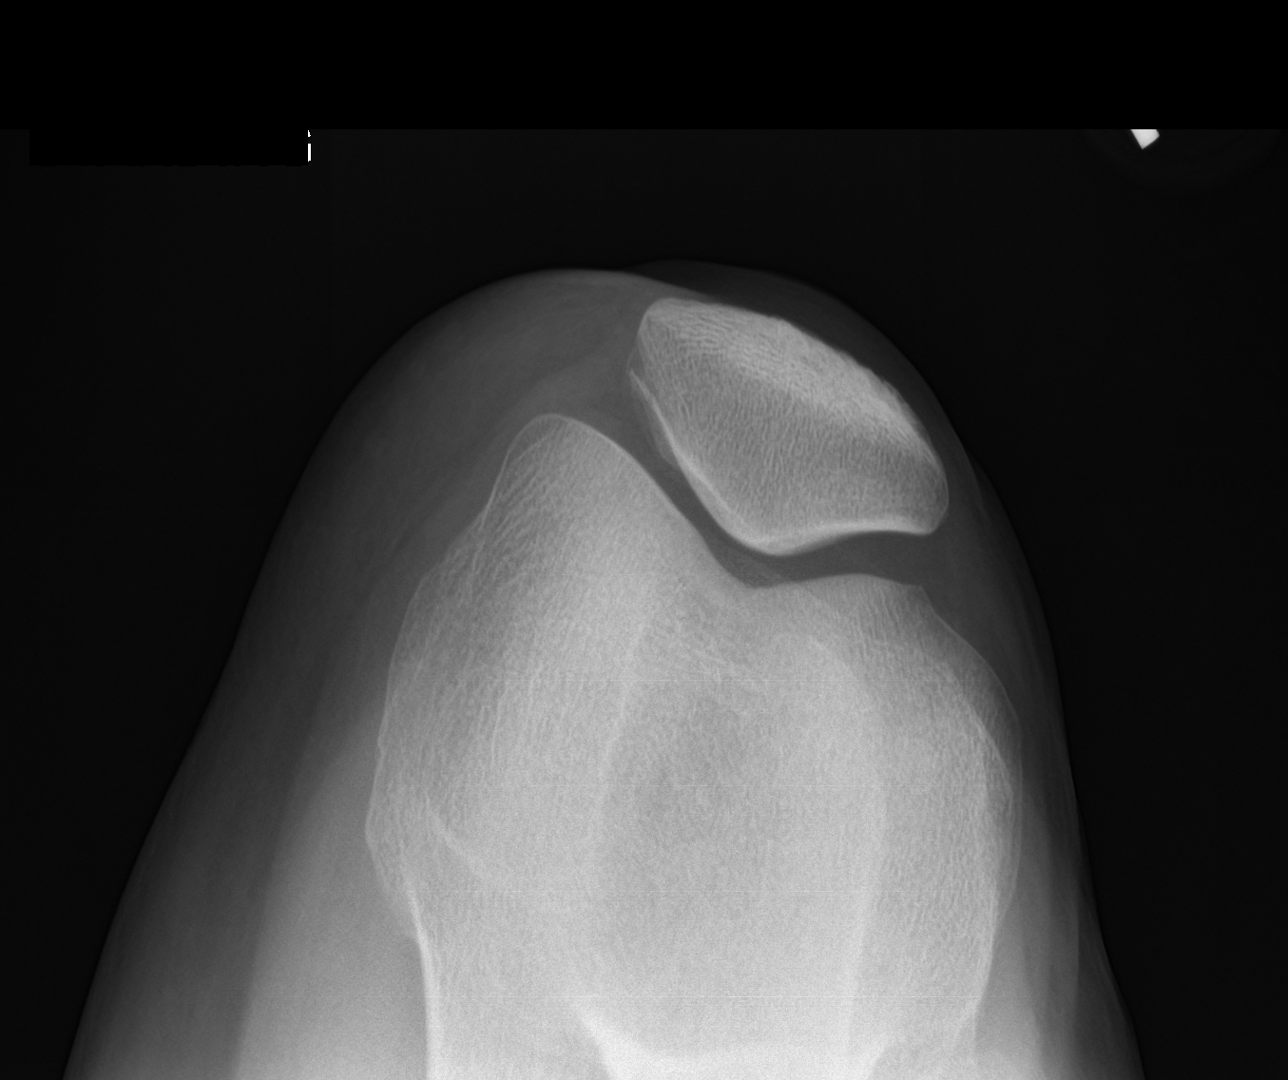

[4 of 4 positions shown; findings below may reference images not displayed]

FINDINGS: Minimal tricompartmental degenerative changes with tiny osteophytes.
No fracture or dislocation. No joint effusion. No other
abnormalities.
IMPRESSION: Minimal tricompartmental degenerative changes.

## 2021-08-16 MED ORDER — LIDOCAINE HCL (PF) 1 % IJ SOLN
4.0000 mL | Freq: Once | INTRAMUSCULAR | Status: AC
  Administered 2021-08-16: 4 mL via INTRADERMAL

## 2021-08-16 MED ORDER — METHYLPREDNISOLONE ACETATE 40 MG/ML IJ SUSP
40.0000 mg | Freq: Once | INTRAMUSCULAR | Status: AC
  Administered 2021-08-16: 40 mg via INTRA_ARTICULAR

## 2021-08-16 NOTE — Assessment & Plan Note (Signed)
New pain in both knees significantly worse in the left knee Notable swelling in the left knee with joint line tenderness We will get x-rays of both knees Suspect OA that may have been exacerbated by change in gait related to her hip pain Corticosteroid injection in her left knee as below today

## 2021-08-16 NOTE — Assessment & Plan Note (Signed)
Likely contact Derm from [employer] Discontinue that and use OTC hydrocortisone Return precautions discussed

## 2021-08-16 NOTE — Assessment & Plan Note (Signed)
Well controlled Continue current medications Recheck metabolic panel F/u in 6 months  

## 2021-08-16 NOTE — Patient Instructions (Signed)
Tried hydrocortisone cream for the back of the knees

## 2021-08-16 NOTE — Assessment & Plan Note (Signed)
Reviewed last panel No changes today Recheck at next visit

## 2021-08-21 ENCOUNTER — Telehealth: Payer: Self-pay

## 2021-08-21 NOTE — Telephone Encounter (Signed)
Patient advised of diagnosis with hyperlipidemia and contact derm. Patient did not have any other questions.

## 2021-08-21 NOTE — Telephone Encounter (Signed)
Copied from [place] [id number]. Topic: General - Other >> Aug 21, 2021  1:19 PM Fields, Museum/gallery conservator R wrote: Reason for CRM: patient calling in needing clarification on AVS from 12/15. Shehad questions regarding hyperlipidemia and Irritant contact dermatitis due to cosmetics. Requesting call back at [phone]

## 2021-08-29 ENCOUNTER — Other Ambulatory Visit: Payer: Self-pay

## 2021-08-29 ENCOUNTER — Ambulatory Visit (INDEPENDENT_AMBULATORY_CARE_PROVIDER_SITE_OTHER): Payer: PPO

## 2021-08-29 DIAGNOSIS — R0609 Other forms of dyspnea: Secondary | ICD-10-CM | POA: Diagnosis not present

## 2021-08-29 LAB — ECHOCARDIOGRAM COMPLETE
AR max vel: 3.07 cm2
AV Area VTI: 3.16 cm2
AV Area mean vel: 2.7 cm2
AV Mean grad: 2 mmHg
AV Peak grad: 3.8 mmHg
Ao pk vel: 0.98 m/s
Area-P 1/2: 4.46 cm2
Calc EF: 52.7 %
S' Lateral: 2.1 cm
Single Plane A2C EF: 55.5 %
Single Plane A4C EF: 48.1 %

## 2021-09-04 ENCOUNTER — Other Ambulatory Visit: Payer: Self-pay | Admitting: Family Medicine

## 2021-09-04 DIAGNOSIS — E782 Mixed hyperlipidemia: Secondary | ICD-10-CM

## 2021-09-07 ENCOUNTER — Other Ambulatory Visit: Payer: Self-pay | Admitting: Internal Medicine

## 2021-09-18 ENCOUNTER — Ambulatory Visit: Payer: PPO | Admitting: Nurse Practitioner

## 2021-09-28 ENCOUNTER — Ambulatory Visit: Payer: PPO | Admitting: Nurse Practitioner

## 2021-09-28 ENCOUNTER — Encounter: Payer: Self-pay | Admitting: Nurse Practitioner

## 2021-09-28 ENCOUNTER — Other Ambulatory Visit: Payer: Self-pay

## 2021-09-28 VITALS — BP 144/90 | HR 84 | Ht 62.0 in | Wt 153.0 lb

## 2021-09-28 DIAGNOSIS — G4730 Sleep apnea, unspecified: Secondary | ICD-10-CM

## 2021-09-28 DIAGNOSIS — R0609 Other forms of dyspnea: Secondary | ICD-10-CM | POA: Diagnosis not present

## 2021-09-28 DIAGNOSIS — Z79899 Other long term (current) drug therapy: Secondary | ICD-10-CM | POA: Diagnosis not present

## 2021-09-28 DIAGNOSIS — I5189 Other ill-defined heart diseases: Secondary | ICD-10-CM | POA: Diagnosis not present

## 2021-09-28 DIAGNOSIS — I10 Essential (primary) hypertension: Secondary | ICD-10-CM

## 2021-09-28 DIAGNOSIS — E782 Mixed hyperlipidemia: Secondary | ICD-10-CM

## 2021-09-28 MED ORDER — LOSARTAN POTASSIUM 100 MG PO TABS: 100.0000 mg | ORAL_TABLET | Freq: Every day | ORAL | 6 refills | Status: DC

## 2021-09-28 MED ORDER — METOPROLOL SUCCINATE ER 25 MG PO TB24: 25.0000 mg | ORAL_TABLET | Freq: Every day | ORAL | 3 refills | Status: DC

## 2021-09-28 NOTE — Progress Notes (Signed)
Office Visit    Patient Name: Sarah Todd Date of Encounter: 09/28/2021  Primary Care Provider:  Virginia Crews, MD Primary Cardiologist:  Nelva Bush, MD  Chief Complaint    76 year old female with a history of hypertension, hyperlipidemia, GERD, varicose veins, and arthritis, who presents for follow-up secondary to dyspnea and fatigue with diastolic dysfunction on echocardiogram.  Past Medical History    Past Medical History:  Diagnosis Date   Allergy    Arthritis    hands   Family history of adverse reaction to anesthesia    sister but not sure of what happened   GERD (gastroesophageal reflux disease)    Hyperlipidemia    Hypertension    Osteoporosis    Varicose veins    Mild   Past Surgical History:  Procedure Laterality Date   ABDOMINAL HYSTERECTOMY     ANTERIOR CERVICAL DECOMP/DISCECTOMY FUSION N/A 07/21/2019   Procedure: ANTERIOR CERVICAL DECOMPRESSION/DISCECTOMY FUSION CERVICAL THREE- CERVICAL FOUR, CERVICAL FOUR- CERVICAL FIVE, CERVICAL FIVE- CERVICAL SIX;  Surgeon: Jovita Gamma, MD;  Location: [facility] [place];  Service: Neurosurgery;  Laterality: N/A;  ANTERIOR CERVICAL DECOMPRESSION/DISCECTOMY FUSION CERVICAL 3- CERVICAL 4, CERVICAL 4- CERVICAL 5, CERVICAL 5- CERVICAL 6   BACK SURGERY     cervical and lumbar     COLONOSCOPY N/A 01/05/2015   Procedure: COLONOSCOPY;  Surgeon: Lucilla Lame, MD;  Location: [place];  Service: Gastroenterology;  Laterality: N/A;   COLONOSCOPY WITH PROPOFOL N/A 01/25/2020   Procedure: COLONOSCOPY WITH PROPOFOL;  Surgeon: Lucilla Lame, MD;  Location: [facility] ENDOSCOPY;  Service: Endoscopy;  Laterality: N/A;   DILATION AND CURETTAGE OF UTERUS     FRACTURE SURGERY     clavicle right   TOOTH EXTRACTION     TOTAL HIP ARTHROPLASTY Right 12/06/2020   Procedure: TOTAL HIP ARTHROPLASTY ANTERIOR APPROACH;  Surgeon: Lovell Sheehan, MD;  Location: [facility];  Service: Orthopedics;  Laterality: Right;   TUBAL LIGATION       Allergies  Allergies  Allergen Reactions   Lisinopril Anaphylaxis   Shrimp [Shellfish Allergy] Anaphylaxis   Aspercreme [Trolamine Salicylate] Rash    History of Present Illness    76 year old female with a history of hypertension, hyperlipidemia, GERD, varicose veins, and arthritis.  She establish care with our practice in November in the setting of progressive fatigue and dyspnea on exertion which began in April, following right hip surgery.  She was noted to have a new left bundle branch block on ECG.  She was placed on metoprolol succinate 25 mg daily and subsequently underwent echocardiography which showed an EF of 55 to 60% without regional wall motion abnormalities, grade 2 diastolic dysfunction, and mild MR.  Since her last visit, she feels that DOE has improved slightly, though she still experiences dyspnea with minimal activity around the house.  She also continues to experience daytime fatigue and sleepiness in the afternoons.  We discussed her sleep habits and she notes that she wakes up 3-4 times per night to urinate.  Her husband has told her that she snores and she notes that sometimes when she is napping during the day, she will wake suddenly gasping for breath.  She would be willing to undergo sleep evaluation in the future.  She denies chest pain, palpitations, PND, orthopnea, dizziness, syncope, edema, or early satiety.  Home Medications    Current Outpatient Medications  Medication Sig Dispense Refill   acetaminophen (TYLENOL) 650 MG CR tablet Take 2,600 mg by mouth daily as needed for  pain.     aspirin 81 MG chewable tablet Chew 81 mg by mouth daily.     Cholecalciferol (VITAMIN D) 50 MCG (2000 UT) tablet Take 2,000 Units by mouth daily.     Coenzyme Q10 (COQ-10) 100 MG capsule Take 1 capsule (100 mg total) by mouth daily. 30 capsule 3   estradiol (ESTRACE) 0.5 MG tablet TAKE ONE TABLET EVERY DAY 90 tablet 1   gabapentin (NEURONTIN) 600 MG tablet Take 600-900 mg by  mouth See admin instructions. Take 600 mg by mouth in the morning and 900 mg at night     hydrocortisone 2.5 % cream Apply 1 application topically 2 (two) times daily as needed (irritation). 28 g 2   losartan (COZAAR) 100 MG tablet Take 1 tablet (100 mg total) by mouth daily. 30 tablet 6   mirabegron ER (MYRBETRIQ) 25 MG TB24 tablet Take 1 tablet (25 mg total) by mouth daily. 30 tablet 3   Multiple Vitamin (MULTIVITAMIN) capsule Take 1 capsule by mouth daily.      Omega-3 Fatty Acids (FISH OIL) 1000 MG CAPS Take 1,000-2,000 mg by mouth See admin instructions. Take 1000 mg in the morning and 2000 mg at night     Probiotic Product (PROBIOTIC PO) Take 1 capsule by mouth daily.     rosuvastatin (CRESTOR) 5 MG tablet TAKE 1 TABLET BY MOUTH DAILY. 90 tablet 3   triamcinolone cream (KENALOG) 0.1 % Apply 1 application topically 2 (two) times daily as needed.     vitamin C (ASCORBIC ACID) 500 MG tablet Take 500 mg by mouth daily.      metoprolol succinate (TOPROL-XL) 25 MG 24 hr tablet Take 1 tablet (25 mg total) by mouth daily. 90 tablet 3   No current facility-administered medications for this visit.     Review of Systems    Ongoing dyspnea on exertion as outlined above.  It is slightly improved.  Daytime fatigue and sleepiness persist.  She denies chest pain, palpitations, PND, orthopnea, dizziness, syncope, edema, or early satiety.  All other systems reviewed and are otherwise negative except as noted above.    Physical Exam    VS:  BP (!) 144/90 (BP Location: Left Arm, Patient Position: Sitting, Cuff Size: Normal)    Pulse 84    Ht 5\' 2"  (1.575 m)    Wt 153 lb (69.4 kg)    SpO2 97%    BMI 27.98 kg/m  , BMI Body mass index is 27.98 kg/m.     GEN: Well nourished, well developed, in no acute distress. HEENT: normal. Neck: Supple, no JVD, carotid bruits, or masses. Cardiac: RRR, no murmurs, rubs, or gallops. No clubbing, cyanosis, edema.  Radials/PT 2+ and equal bilaterally.  Respiratory:   Respirations regular and unlabored, clear to auscultation bilaterally. GI: Soft, nontender, nondistended, BS + x 4. MS: no deformity or atrophy. Skin: warm and dry, no rash. Neuro:  Strength and sensation are intact. Psych: Normal affect.  Accessory Clinical Findings     Lab Results  Component Value Date   WBC 8.6 06/26/2021   HGB 13.4 06/26/2021   HCT 38.8 06/26/2021   MCV 93 06/26/2021   PLT 320 06/26/2021   Lab Results  Component Value Date   CREATININE 0.62 07/30/2021   BUN 12 07/30/2021   NA 136 07/30/2021   K 4.6 07/30/2021   CL 99 07/30/2021   CO2 19 (L) 07/30/2021   Lab Results  Component Value Date   ALT 16 07/30/2021   AST  21 07/30/2021   ALKPHOS 55 07/30/2021   BILITOT 0.3 07/30/2021   Lab Results  Component Value Date   CHOL 236 (H) 11/27/2020   HDL 72 11/27/2020   LDLCALC 136 (H) 11/27/2020   TRIG 158 (H) 11/27/2020   CHOLHDL 3.3 11/27/2020    Lab Results  Component Value Date   HGBA1C 5.4 12/18/2017    Assessment & Plan    1.  Dyspnea on exertion/diastolic dysfunction: Multifactorial likely in the setting of some degree of deconditioning following hip surgery last spring with associated weight gain, as well as diastolic dysfunction noted on echo.  Echo otherwise showed normal LV function without significant valvular abnormalities.  We discussed options for ischemic evaluation today in order to rule out coronary artery disease as a possible contributor to her dyspnea, and we mutually agreed to pursue a [employer].  The benefits (risk stratification, diagnosing coronary artery disease, treatment guidance) and risks [chest pain, dyspnea, cardiac arrhythmias, dizziness, low blood pressure, allergic reaction, heart attack, and life-threatening complications (estimated to be 1 in 10,000)] were discussed in detail with Wonda Amis and she agrees to proceed.  She is euvolemic on examination.  Her blood pressure is elevated at 144/90.  She does not  check this at home.  I am increasing her losartan to 100 mg daily and will continue her current dose of Toprol-25 mg daily.  I do not think she requires a diuretic at this time.  2.  Essential hypertension: As outlined above, blood pressure elevated.  Increasing losartan to 100 mg daily.  Continue beta-blocker.  Follow-up basic metabolic panel in 1 week.  3.  Hyperlipidemia: She remains on statin therapy.  Her LDL was 136 in March 2022.  LFTs were normal in November.  As previously noted, pending ischemic evaluation, she may require further escalation of statin therapy.  4.  Sleep disordered breathing: Patient with daytime somnolence, frequent snoring and occasionally awakening suddenly, gasping for air.  She was agreeable to pulmonology referral for sleep evaluation.  5.  Disposition: Follow-up basic metabolic panel and stress testing in 1 week.  Follow-up in clinic in 1 month or sooner if necessary.  Murray Hodgkins, NP 09/28/2021, 2:12 PM

## 2021-09-28 NOTE — Patient Instructions (Addendum)
Medication Instructions:  - Your physician has recommended you make the following change in your medication:   1) INCREASE losartan to 100 mg: - take 1 tablet by mouth ONCE daily  *If you need a refill on your cardiac medications before your next appointment, please call your pharmacy*   Lab Work: - Your physician recommends that you return for lab work in: 1 week- BMP (we can try to co-ordinate for the same day as your stress test)  If you have labs (blood work) drawn today and your tests are completely normal, you will receive your results only by: [place] (if you have MyChart) OR A paper copy in the mail If you have any lab test that is abnormal or we need to change your treatment, we will call you to review the results.   Testing/Procedures:  1) Lexiscan Myoview (Chemical Stress Test/ Cardiac Nuclear Scan) Your physician has requested that you have a lexiscan myoview.   [place]  Your caregiver has ordered a Stress Test with nuclear imaging. The purpose of this test is to evaluate the blood supply to your heart muscle. This procedure is referred to as a "Non-Invasive Stress Test." This is because other than having an IV started in your vein, nothing is inserted or "invades" your body. Cardiac stress tests are done to find areas of poor blood flow to the heart by determining the extent of coronary artery disease (CAD). Some patients exercise on a treadmill, which naturally increases the blood flow to your heart, while others who are  unable to walk on a treadmill due to physical limitations have a pharmacologic/chemical stress agent called Lexiscan . This medicine will mimic walking on a treadmill by temporarily increasing your coronary blood flow.   Please note: these test may take anywhere between 2-4 hours to complete  PLEASE REPORT TO [place] AT THE FIRST DESK WILL DIRECT YOU WHERE TO GO  Date of  Procedure:_____________________________________  Arrival Time for Procedure:______________________________  Instructions regarding medication:   __x__ : You may take all of your regular morning medications the day of your test  PLEASE NOTIFY THE OFFICE AT LEAST 24 HOURS IN ADVANCE IF YOU ARE UNABLE TO KEEP YOUR APPOINTMENT.  [phone] AND  PLEASE NOTIFY NUCLEAR MEDICINE AT [facility] AT LEAST 24 HOURS IN ADVANCE IF YOU ARE UNABLE TO KEEP YOUR APPOINTMENT. [phone]  How to prepare for your Myoview test:  Do not eat or drink after midnight No caffeine for 24 hours prior to test No smoking 24 hours prior to test. Your medication may be taken with water.  If your doctor stopped a medication because of this test, do not take that medication. Ladies, please do not wear dresses.  Skirts or pants are appropriate. Please wear a short sleeve shirt. No perfume, cologne or lotion. Wear comfortable walking shoes. No heels!    2) You have been referred to : [facility]- consultation for possible sleep study  - The Pulmonary Office will call you directly to schedule an appointment. If you do not hear from them in the next 2 weeks, then please call them directly at [phone] to follow up.   Follow-Up: At [facility], you and your health needs are our priority.  As part of our continuing mission to provide you with exceptional heart care, we have created designated Provider Care Teams.  These Care Teams include your primary Cardiologist (physician) and Advanced Practice Providers (APPs -  Physician Assistants and  Nurse Practitioners) who all work together to provide you with the care you need, when you need it.  We recommend signing up for the patient portal called "MyChart".  Sign up information is provided on this After Visit Summary.  MyChart is used to connect with patients for Virtual Visits (Telemedicine).  Patients are able to view lab/test results, encounter notes, upcoming  appointments, etc.  Non-urgent messages can be sent to your provider as well.   To learn more about what you can do with MyChart, go to [email or web address].    Your next appointment:   1 month(s)  The format for your next appointment:   In Person  Provider:   You may see Nelva Bush, MD or one of the following Advanced Practice Providers on your designated Care Team:   Murray Hodgkins, NP    Other Instructions  Cardiac Nuclear Scan A cardiac nuclear scan is a test that is done to check the flow of blood to your heart. It is done when you are resting and when you are exercising. The test looks for problems such as: Not enough blood reaching a portion of the heart. The heart muscle not working as it should. You may need this test if: You have heart disease. You have had lab results that are not normal. You have had heart surgery or a balloon procedure to open up blocked arteries (angioplasty). You have chest pain. You have shortness of breath. In this test, a special dye (tracer) is put into your bloodstream. The tracer will travel to your heart. A camera will then take pictures of your heart to see how the tracer moves through your heart. This test is usually done at a hospital and takes 2-4 hours. Tell a doctor about: Any allergies you have. All medicines you are taking, including vitamins, herbs, eye drops, creams, and over-the-counter medicines. Any problems you or family members have had with anesthetic medicines. Any blood disorders you have. Any surgeries you have had. Any medical conditions you have. Whether you are pregnant or may be pregnant. What are the risks? Generally, this is a safe test. However, problems may occur, such as: Serious chest pain and heart attack. This is only a risk if the stress portion of the test is done. Rapid heartbeat. A feeling of warmth in your chest. This feeling usually does not last long. Allergic reaction to the tracer. What  happens before the test? Ask your doctor about changing or stopping your normal medicines. This is important. Follow instructions from your doctor about what you cannot eat or drink. Remove your jewelry on the day of the test. What happens during the test? An IV tube will be inserted into one of your veins. Your doctor will give you a small amount of tracer through the IV tube. You will wait for 20-40 minutes while the tracer moves through your bloodstream. Your heart will be monitored with an electrocardiogram (ECG). You will lie down on an exam table. Pictures of your heart will be taken for about 15-20 minutes. You may also have a stress test. For this test, one of these things may be done: You will be asked to exercise on a treadmill or a stationary bike. You will be given medicines that will make your heart work harder. This is done if you are unable to exercise. When blood flow to your heart has peaked, a tracer will again be given through the IV tube. After 20-40 minutes, you will get back on the exam  table. More pictures will be taken of your heart. Depending on the tracer that is used, more pictures may need to be taken 3-4 hours later. Your IV tube will be removed when the test is over. The test may vary among doctors and hospitals. What happens after the test? Ask your doctor: Whether you can return to your normal schedule, including diet, activities, and medicines. Whether you should drink more fluids. This will help to remove the tracer from your body. Drink enough fluid to keep your pee (urine) pale yellow. Ask your doctor, or the department that is doing the test: When will my results be ready? How will I get my results? Summary A cardiac nuclear scan is a test that is done to check the flow of blood to your heart. Tell your doctor whether you are pregnant or may be pregnant. Before the test, ask your doctor about changing or stopping your normal medicines. This is  important. Ask your doctor whether you can return to your normal activities. You may be asked to drink more fluids. This information is not intended to replace advice given to you by your health care provider. Make sure you discuss any questions you have with your health care provider. Document Revised: 05/02/2021 Document Reviewed: 01/31/2021 Elsevier Patient Education  [place].

## 2021-10-04 ENCOUNTER — Other Ambulatory Visit: Payer: Self-pay

## 2021-10-04 ENCOUNTER — Other Ambulatory Visit (INDEPENDENT_AMBULATORY_CARE_PROVIDER_SITE_OTHER): Payer: PPO

## 2021-10-04 ENCOUNTER — Encounter
Admission: RE | Admit: 2021-10-04 | Discharge: 2021-10-04 | Disposition: A | Discharge status: Home / Self Care | Payer: PPO | Source: Ambulatory Visit | Attending: Nurse Practitioner | Admitting: Nurse Practitioner

## 2021-10-04 DIAGNOSIS — I10 Essential (primary) hypertension: Secondary | ICD-10-CM

## 2021-10-04 DIAGNOSIS — Z79899 Other long term (current) drug therapy: Secondary | ICD-10-CM | POA: Diagnosis not present

## 2021-10-04 DIAGNOSIS — R0609 Other forms of dyspnea: Secondary | ICD-10-CM | POA: Diagnosis not present

## 2021-10-04 MED ORDER — REGADENOSON 0.4 MG/5ML IV SOLN
0.4000 mg | Freq: Once | INTRAVENOUS | Status: AC
  Administered 2021-10-04: 0.4 mg via INTRAVENOUS

## 2021-10-04 MED ORDER — TECHNETIUM TC 99M TETROFOSMIN IV KIT
30.7200 | PACK | Freq: Once | INTRAVENOUS | Status: AC | PRN
  Administered 2021-10-04: 30.72 via INTRAVENOUS

## 2021-10-04 MED ORDER — TECHNETIUM TC 99M TETROFOSMIN IV KIT
10.0000 | PACK | Freq: Once | INTRAVENOUS | Status: AC | PRN
  Administered 2021-10-04: 10.6 via INTRAVENOUS

## 2021-10-05 LAB — BASIC METABOLIC PANEL
BUN/Creatinine Ratio: 14 (ref 12–28)
BUN: 9 mg/dL (ref 8–27)
CO2: 26 mmol/L (ref 20–29)
Calcium: 9.5 mg/dL (ref 8.7–10.3)
Chloride: 97 mmol/L (ref 96–106)
Creatinine, Ser: 0.66 mg/dL (ref 0.57–1.00)
Glucose: 100 mg/dL — ABNORMAL HIGH (ref 70–99)
Potassium: 4 mmol/L (ref 3.5–5.2)
Sodium: 135 mmol/L (ref 134–144)
eGFR: 90 mL/min/{1.73_m2} (ref >59)

## 2021-10-08 ENCOUNTER — Telehealth: Payer: Self-pay | Admitting: Nurse Practitioner

## 2021-10-08 DIAGNOSIS — E782 Mixed hyperlipidemia: Secondary | ICD-10-CM

## 2021-10-08 DIAGNOSIS — Z79899 Other long term (current) drug therapy: Secondary | ICD-10-CM

## 2021-10-08 DIAGNOSIS — L718 Other rosacea: Secondary | ICD-10-CM | POA: Diagnosis not present

## 2021-10-08 MED ORDER — ROSUVASTATIN CALCIUM 20 MG PO TABS: 20.0000 mg | ORAL_TABLET | Freq: Every day | ORAL | 3 refills | Status: DC

## 2021-10-08 NOTE — Telephone Encounter (Signed)
See results note. Reviewed results and recommendations for labs and stress test. Patient verbalized understanding and was agreeable with medication increase and repeat labs. Fasting lipid and lft to be done in 6 weeks. Scheduled repeat labs and sent in updated prescription. She verbalized understanding of recommendations with no further questions at this time.

## 2021-10-08 NOTE — Telephone Encounter (Signed)
° ° °  Please call with stress test results °

## 2021-10-17 LAB — NM MYOCAR MULTI W/SPECT W/WALL MOTION / EF
Estimated workload: 1
Exercise duration (min): 0 min
Exercise duration (sec): 0 s
LV dias vol: 49 mL (ref 46–106)
LV sys vol: 16 mL
MPHR: 143 {beats}/min
Nuc Stress EF: 67 %
Peak HR: 91 {beats}/min
Percent HR: 63 %
Rest HR: 68 {beats}/min
Rest Nuclear Isotope Dose: 10.6 mCi
SDS: 0
SRS: 2
SSS: 3
ST Depression (mm): 0 mm
Stress Nuclear Isotope Dose: 30.7 mCi
TID: 0.91

## 2021-10-24 ENCOUNTER — Ambulatory Visit (INDEPENDENT_AMBULATORY_CARE_PROVIDER_SITE_OTHER): Payer: PPO

## 2021-10-24 ENCOUNTER — Other Ambulatory Visit: Payer: Self-pay

## 2021-10-24 VITALS — BP 118/68 | HR 89 | Temp 98.2°F | Ht 62.0 in | Wt 148.5 lb

## 2021-10-24 DIAGNOSIS — M25559 Pain in unspecified hip: Secondary | ICD-10-CM | POA: Insufficient documentation

## 2021-10-24 DIAGNOSIS — M961 Postlaminectomy syndrome, not elsewhere classified: Secondary | ICD-10-CM | POA: Insufficient documentation

## 2021-10-24 DIAGNOSIS — S129XXA Fracture of neck, unspecified, initial encounter: Secondary | ICD-10-CM | POA: Insufficient documentation

## 2021-10-24 DIAGNOSIS — Z0000 Encounter for general adult medical examination without abnormal findings: Secondary | ICD-10-CM | POA: Diagnosis not present

## 2021-10-24 NOTE — Patient Instructions (Signed)
Sarah Todd , Thank you for taking time to come for your Medicare Wellness Visit. I appreciate your ongoing commitment to your health goals. Please review the following plan we discussed and let me know if I can assist you in the future.   Screening recommendations/referrals: Colonoscopy: 01/25/20 Mammogram: 02/23/21 Bone Density: 10/05/13, declined referral Recommended yearly ophthalmology/optometry visit for glaucoma screening and checkup Recommended yearly dental visit for hygiene and checkup  Vaccinations: Influenza vaccine: 07/27/21 Pneumococcal vaccine: 11/17/14 Tdap vaccine: 10/16/16 Shingles vaccine: Shingrix 09/09/18, 03/09/19   Covid-19:10/30/19, 11/27/19, 08/11/20  Advanced directives: yes  Conditions/risks identified: none  Next appointment: Follow up in one year for your annual wellness visit - declined   Preventive Care 78 Years and Older, Female Preventive care refers to lifestyle choices and visits with your health care provider that can promote health and wellness. What does preventive care include? A yearly physical exam. This is also called an annual well check. Dental exams once or twice a year. Routine eye exams. Ask your health care provider how often you should have your eyes checked. Personal lifestyle choices, including: Daily care of your teeth and gums. Regular physical activity. Eating a healthy diet. Avoiding tobacco and drug use. Limiting alcohol use. Practicing safe sex. Taking low-dose aspirin every day. Taking vitamin and mineral supplements as recommended by your health care provider. What happens during an annual well check? The services and screenings done by your health care provider during your annual well check will depend on your age, overall health, lifestyle risk factors, and family history of disease. Counseling  Your health care provider may ask you questions about your: Alcohol use. Tobacco use. Drug use. Emotional well-being. Home and  relationship well-being. Sexual activity. Eating habits. History of falls. Memory and ability to understand (cognition). Work and work Statistician. Reproductive health. Screening  You may have the following tests or measurements: Height, weight, and BMI. Blood pressure. Lipid and cholesterol levels. These may be checked every 5 years, or more frequently if you are over 69 years old. Skin check. Lung cancer screening. You may have this screening every year starting at age 78 if you have a 30-pack-year history of smoking and currently smoke or have quit within the past 15 years. Fecal occult blood test (FOBT) of the stool. You may have this test every year starting at age 78. Flexible sigmoidoscopy or colonoscopy. You may have a sigmoidoscopy every 5 years or a colonoscopy every 10 years starting at age 78. Hepatitis C blood test. Hepatitis B blood test. Sexually transmitted disease (STD) testing. Diabetes screening. This is done by checking your blood sugar (glucose) after you have not eaten for a while (fasting). You may have this done every 1-3 years. Bone density scan. This is done to screen for osteoporosis. You may have this done starting at age 78. Mammogram. This may be done every 1-2 years. Talk to your health care provider about how often you should have regular mammograms. Talk with your health care provider about your test results, treatment options, and if necessary, the need for more tests. Vaccines  Your health care provider may recommend certain vaccines, such as: Influenza vaccine. This is recommended every year. Tetanus, diphtheria, and acellular pertussis (Tdap, Td) vaccine. You may need a Td booster every 10 years. Zoster vaccine. You may need this after age 30. Pneumococcal 13-valent conjugate (PCV13) vaccine. One dose is recommended after age 78. Pneumococcal polysaccharide (PPSV23) vaccine. One dose is recommended after age 78. Talk to your health care  provider  about which screenings and vaccines you need and how often you need them. This information is not intended to replace advice given to you by your health care provider. Make sure you discuss any questions you have with your health care provider. Document Released: 09/15/2015 Document Revised: 05/08/2016 Document Reviewed: 06/20/2015 Elsevier Interactive Patient Education  2017 [place] Prevention in the Home Falls can cause injuries. They can happen to people of all ages. There are many things you can do to make your home safe and to help prevent falls. What can I do on the outside of my home? Regularly fix the edges of walkways and driveways and fix any cracks. Remove anything that might make you trip as you walk through a door, such as a raised step or threshold. Trim any bushes or trees on the path to your home. Use bright outdoor lighting. Clear any walking paths of anything that might make someone trip, such as rocks or tools. Regularly check to see if handrails are loose or broken. Make sure that both sides of any steps have handrails. Any raised decks and porches should have guardrails on the edges. Have any leaves, snow, or ice cleared regularly. Use sand or salt on walking paths during winter. Clean up any spills in your garage right away. This includes oil or grease spills. What can I do in the bathroom? Use night lights. Install grab bars by the toilet and in the tub and shower. Do not use towel bars as grab bars. Use non-skid mats or decals in the tub or shower. If you need to sit down in the shower, use a plastic, non-slip stool. Keep the floor dry. Clean up any water that spills on the floor as soon as it happens. Remove soap buildup in the tub or shower regularly. Attach bath mats securely with double-sided non-slip rug tape. Do not have throw rugs and other things on the floor that can make you trip. What can I do in the bedroom? Use night lights. Make sure  that you have a light by your bed that is easy to reach. Do not use any sheets or blankets that are too big for your bed. They should not hang down onto the floor. Have a firm chair that has side arms. You can use this for support while you get dressed. Do not have throw rugs and other things on the floor that can make you trip. What can I do in the kitchen? Clean up any spills right away. Avoid walking on wet floors. Keep items that you use a lot in easy-to-reach places. If you need to reach something above you, use a strong step stool that has a grab bar. Keep electrical cords out of the way. Do not use floor polish or wax that makes floors slippery. If you must use wax, use non-skid floor wax. Do not have throw rugs and other things on the floor that can make you trip. What can I do with my stairs? Do not leave any items on the stairs. Make sure that there are handrails on both sides of the stairs and use them. Fix handrails that are broken or loose. Make sure that handrails are as long as the stairways. Check any carpeting to make sure that it is firmly attached to the stairs. Fix any carpet that is loose or worn. Avoid having throw rugs at the top or bottom of the stairs. If you do have throw rugs, attach them to the  floor with carpet tape. Make sure that you have a light switch at the top of the stairs and the bottom of the stairs. If you do not have them, ask someone to add them for you. What else can I do to help prevent falls? Wear shoes that: Do not have high heels. Have rubber bottoms. Are comfortable and fit you well. Are closed at the toe. Do not wear sandals. If you use a stepladder: Make sure that it is fully opened. Do not climb a closed stepladder. Make sure that both sides of the stepladder are locked into place. Ask someone to hold it for you, if possible. Clearly mark and make sure that you can see: Any grab bars or handrails. First and last steps. Where the edge of  each step is. Use tools that help you move around (mobility aids) if they are needed. These include: Canes. Walkers. Scooters. Crutches. Turn on the lights when you go into a dark area. Replace any light bulbs as soon as they burn out. Set up your furniture so you have a clear path. Avoid moving your furniture around. If any of your floors are uneven, fix them. If there are any pets around you, be aware of where they are. Review your medicines with your doctor. Some medicines can make you feel dizzy. This can increase your chance of falling. Ask your doctor what other things that you can do to help prevent falls. This information is not intended to replace advice given to you by your health care provider. Make sure you discuss any questions you have with your health care provider. Document Released: 06/15/2009 Document Revised: 01/25/2016 Document Reviewed: 09/23/2014 Elsevier Interactive Patient Education  2017 [employer].

## 2021-10-24 NOTE — Progress Notes (Signed)
Subjective:   Sarah Todd is a 76 y.o. female who presents for Medicare Annual (Subsequent) preventive examination.  Review of Systems           Objective:    Today's Vitals   10/24/21 1449  BP: 118/68  Pulse: 89  Temp: 98.2 F (36.8 C)  TempSrc: Oral  SpO2: 98%  Weight: 148 lb 8 oz (67.4 kg)  Height: 5\' 2"  (1.575 m)   Body mass index is 27.16 kg/m.  Advanced Directives 12/06/2020 12/06/2020 11/28/2020 02/29/2020 01/25/2020 07/19/2019 04/21/2019  Does Patient Have a Medical Advance Directive? Yes Yes Yes Yes Yes Yes No  Type of Advance Directive Living will Living will Living will [place];Living will Living will Living will -  Does patient want to make changes to medical advance directive? No - Patient declined No - Patient declined No - Patient declined - - No - Patient declined -  Copy of [place] in Chart? - - - Yes - validated most recent copy scanned in chart (See row information) - - -  Would patient like information on creating a medical advance directive? No - Patient declined No - Patient declined - - - No - Patient declined -    Current Medications (verified) Outpatient Encounter Medications as of 10/24/2021  Medication Sig   acetaminophen (TYLENOL) 650 MG CR tablet Take 2,600 mg by mouth daily as needed for pain.   aspirin 81 MG chewable tablet Chew 81 mg by mouth daily.   Cholecalciferol (VITAMIN D) 50 MCG (2000 UT) tablet Take 2,000 Units by mouth daily.   Coenzyme Q10 (COQ-10) 100 MG capsule Take 1 capsule (100 mg total) by mouth daily.   estradiol (ESTRACE) 0.5 MG tablet TAKE ONE TABLET EVERY DAY   gabapentin (NEURONTIN) 600 MG tablet Take 600-900 mg by mouth See admin instructions. Take 600 mg by mouth in the morning and 900 mg at night   hydrocortisone 2.5 % cream Apply 1 application topically 2 (two) times daily as needed (irritation).   losartan (COZAAR) 100 MG tablet Take 1 tablet (100 mg total) by mouth daily.    metoprolol succinate (TOPROL-XL) 25 MG 24 hr tablet Take 1 tablet (25 mg total) by mouth daily.   mirabegron ER (MYRBETRIQ) 25 MG TB24 tablet Take 1 tablet (25 mg total) by mouth daily.   Multiple Vitamin (MULTIVITAMIN) capsule Take 1 capsule by mouth daily.    Omega-3 Fatty Acids (FISH OIL) 1000 MG CAPS Take 1,000-2,000 mg by mouth See admin instructions. Take 1000 mg in the morning and 2000 mg at night   Probiotic Product (PROBIOTIC PO) Take 1 capsule by mouth daily.   rosuvastatin (CRESTOR) 20 MG tablet Take 1 tablet (20 mg total) by mouth daily.   triamcinolone cream (KENALOG) 0.1 % Apply 1 application topically 2 (two) times daily as needed.   vitamin C (ASCORBIC ACID) 500 MG tablet Take 500 mg by mouth daily.    No facility-administered encounter medications on file as of 10/24/2021.    Allergies (verified) Lisinopril, Shrimp [shellfish allergy], and Aspercreme [trolamine salicylate]   History: Past Medical History:  Diagnosis Date   Allergy    Arthritis    hands   Diastolic dysfunction    a. 08/2021 Echo: EF 55-60%, no rwma. GrII DD. Nl RV fxn. Mild MR. Ao sclerosis w/o stenosis.   Family history of adverse reaction to anesthesia    sister but not sure of what happened   GERD (gastroesophageal reflux  disease)    Hyperlipidemia    Hypertension    Osteoporosis    Varicose veins    Mild   Past Surgical History:  Procedure Laterality Date   ABDOMINAL HYSTERECTOMY     ANTERIOR CERVICAL DECOMP/DISCECTOMY FUSION N/A 07/21/2019   Procedure: ANTERIOR CERVICAL DECOMPRESSION/DISCECTOMY FUSION CERVICAL THREE- CERVICAL FOUR, CERVICAL FOUR- CERVICAL FIVE, CERVICAL FIVE- CERVICAL SIX;  Surgeon: Jovita Gamma, MD;  Location: [facility];  Service: Neurosurgery;  Laterality: N/A;  ANTERIOR CERVICAL DECOMPRESSION/DISCECTOMY FUSION CERVICAL 3- CERVICAL 4, CERVICAL 4- CERVICAL 5, CERVICAL 5- CERVICAL 6   BACK SURGERY     cervical and lumbar     COLONOSCOPY N/A 01/05/2015   Procedure:  COLONOSCOPY;  Surgeon: Lucilla Lame, MD;  Location: [facility];  Service: Gastroenterology;  Laterality: N/A;   COLONOSCOPY WITH PROPOFOL N/A 01/25/2020   Procedure: COLONOSCOPY WITH PROPOFOL;  Surgeon: Lucilla Lame, MD;  Location: [facility] ENDOSCOPY;  Service: Endoscopy;  Laterality: N/A;   DILATION AND CURETTAGE OF UTERUS     FRACTURE SURGERY     clavicle right   TOOTH EXTRACTION     TOTAL HIP ARTHROPLASTY Right 12/06/2020   Procedure: TOTAL HIP ARTHROPLASTY ANTERIOR APPROACH;  Surgeon: Lovell Sheehan, MD;  Location: [facility];  Service: Orthopedics;  Laterality: Right;   TUBAL LIGATION     Family History  Problem Relation Age of Onset   Cancer Mother        colon cancer   Alzheimer's disease Father    Heart disease Father    Hypertension Father    Cancer Sister        uterine   Breast cancer Neg Hx    Social History   Socioeconomic History   Marital status: Married    Spouse name: Sarah Todd   Number of children: 3   Years of education: 15   Highest education level: Some college, no degree  Occupational History   Occupation: retired  Tobacco Use   Smoking status: Never   Smokeless tobacco: Never  Vaping Use   Vaping Use: Never used  Substance and Sexual Activity   Alcohol use: No   Drug use: No   Sexual activity: Yes  Other Topics Concern   Not on file  Social History Narrative   Not on file   Social Determinants of Health   Financial Resource Strain: Not on file  Food Insecurity: Not on file  Transportation Needs: Not on file  Physical Activity: Not on file  Stress: Not on file  Social Connections: Not on file    Tobacco Counseling Counseling given: Not Answered   Clinical Intake:  Pre-visit preparation completed: Yes  Pain : No/denies pain     Nutritional Risks: None Diabetes: No  How often do you need to have someone help you when you read instructions, pamphlets, or other written materials from your doctor or pharmacy?: 1 -  Never  Diabetic?no  Interpreter Needed?: No  Information entered by :: Kirke Shaggy, LPN   Activities of Daily Living In your present state of health, do you have any difficulty performing the following activities: 10/21/2021 06/26/2021  Hearing? N N  Vision? Y Y  Difficulty concentrating or making decisions? N N  Walking or climbing stairs? N Y  Dressing or bathing? N N  Doing errands, shopping? N N  Preparing Food and eating ? N -  Using the Toilet? N -  In the past six months, have you accidently leaked urine? N -  Do you have  problems with loss of bowel control? N -  Managing your Medications? N -  Managing your Finances? N -  Housekeeping or managing your Housekeeping? N -  Some recent data might be hidden    Patient Care Team: Brita Romp Dionne Bucy, MD as PCP - General (Family Medicine) End, Harrell Gave, MD as PCP - Cardiology (Cardiology) Arelia Sneddon, [place] as Consulting Physician (Optometry) Beverly Gust, MD (Otolaryngology) Kary Kos, MD as Consulting Physician (Neurosurgery) Lucilla Lame, MD as Consulting Physician (Gastroenterology) Thornton Park, MD as Referring Physician (Orthopedic Surgery) Vladimir Crofts, MD as Consulting Physician (Neurology)  Indicate any recent Medical Services you may have received from other than [facility] providers in the past year (date may be approximate).     Assessment:   This is a routine wellness examination for Cherisse.  Hearing/Vision screen No results found.  Dietary issues and exercise activities discussed:     Goals Addressed   None    Depression Screen PHQ 2/9 Scores 06/26/2021 12/01/2020 11/06/2020 02/29/2020 12/20/2019 02/23/2019 02/23/2019  PHQ - 2 Score 0 0 0 0 0 0 0  PHQ- 9 Score 4 3 3  - - 0 -    Fall Risk Fall Risk  10/21/2021 06/26/2021 12/01/2020 11/06/2020 02/29/2020  Falls in the past year? 0 0 0 0 0  Number falls in past yr: - 0 - 0 0  Injury with Fall? - 0 0 0 0  Risk for fall due to : - No Fall Risks -  No Fall Risks -  Follow up - Falls evaluation completed Falls evaluation completed Falls evaluation completed -    FALL RISK PREVENTION PERTAINING TO THE HOME:  Any stairs in or around the home? Yes  If so, are there any without handrails? No  Home free of loose throw rugs in walkways, pet beds, electrical cords, etc? Yes  Adequate lighting in your home to reduce risk of falls? Yes   ASSISTIVE DEVICES UTILIZED TO PREVENT FALLS:  Life alert? No  Use of a cane, walker or w/c? No  Grab bars in the bathroom? No  Shower chair or bench in shower? No  Elevated toilet seat or a handicapped toilet? No   TIMED UP AND GO:  Was the test performed? Yes .  Length of time to ambulate 10 feet: 4 sec.   Gait steady and fast without use of assistive device  Cognitive Function:     6CIT Screen 02/29/2020 12/20/2019 02/23/2019 12/18/2017  What Year? 0 points 0 points 0 points 0 points  What month? 0 points 0 points 0 points 0 points  What time? 0 points 0 points 0 points 0 points  Count back from 20 0 points 0 points 0 points 0 points  Months in reverse 0 points 0 points 0 points 0 points  Repeat phrase 0 points 6 points 0 points 0 points  Total Score 0 6 0 0    Immunizations Immunization History  Administered Date(s) Administered   Influenza,inj,Quad PF,6+ Mos 06/15/2015   Influenza-Unspecified 05/13/2017, 06/09/2018, 05/18/2019, 07/28/2020, 07/27/2021   Moderna Sars-Covid-2 Vaccination 10/30/2019, 11/27/2019, 08/11/2020   Pneumococcal Conjugate-13 11/17/2014   Pneumococcal Polysaccharide-23 08/03/2013   Tdap 10/16/2016   Zoster Recombinat (Shingrix) 09/09/2018, 03/09/2019    TDAP status: Up to date  Flu Vaccine status: Up to date  Pneumococcal vaccine status: Up to date  Covid-19 vaccine status: Information provided on how to obtain vaccines.   Qualifies for Shingles Vaccine? Yes   Zostavax completed No   Shingrix Completed?: Yes  Screening Tests Health Maintenance  Topic  Date Due   COVID-19 Vaccine (4 - Booster for Moderna series) 10/06/2020   COLONOSCOPY (Pts 45-79yrs Insurance coverage will need to be confirmed)  01/24/2025   TETANUS/TDAP  10/16/2026   Pneumonia Vaccine 7+ Years old  Completed   INFLUENZA VACCINE  Completed   DEXA SCAN  Completed   Hepatitis C Screening  Completed   Zoster Vaccines- Shingrix  Completed   HPV VACCINES  Aged Out    Health Maintenance  Health Maintenance Due  Topic Date Due   COVID-19 Vaccine (4 - Booster for Moderna series) 10/06/2020    Colorectal cancer screening: Type of screening: Colonoscopy. Completed 01/25/20. Repeat every 5 years  Mammogram status: Completed 02/23/21. Repeat every year  Bone Density status: Completed 10/05/13. Results reflect: Bone density results: NORMAL. Repeat every 5 years.- declined referral  Lung Cancer Screening: (Low Dose CT Chest recommended if Age 36-80 years, 30 pack-year currently smoking OR have quit w/in 15years.) does not qualify.   Additional Screening:  Hepatitis C Screening: does qualify; Completed 12/18/17  Vision Screening: Recommended annual ophthalmology exams for early detection of glaucoma and other disorders of the eye. Is the patient up to date with their annual eye exam?  Yes  Who is the provider or what is the name of the office in which the patient attends annual eye exams? [facility] If pt is not established with a provider, would they like to be referred to a provider to establish care? No .   Dental Screening: Recommended annual dental exams for proper oral hygiene  Community Resource Referral / Chronic Care Management: CRR required this visit?  No   CCM required this visit?  No      Plan:     I have personally reviewed and noted the following in the patients chart:   Medical and social history Use of alcohol, tobacco or illicit drugs  Current medications and supplements including opioid prescriptions.  Functional ability and  status Nutritional status Physical activity Advanced directives List of other physicians Hospitalizations, surgeries, and ER visits in previous 12 months Vitals Screenings to include cognitive, depression, and falls Referrals and appointments  In addition, I have reviewed and discussed with patient certain preventive protocols, quality metrics, and best practice recommendations. A written personalized care plan for preventive services as well as general preventive health recommendations were provided to patient.     Dionisio David, LPN   [id number]   Nurse Notes: none

## 2021-10-29 DIAGNOSIS — M3501 Sicca syndrome with keratoconjunctivitis: Secondary | ICD-10-CM | POA: Diagnosis not present

## 2021-10-30 DIAGNOSIS — Z6826 Body mass index (BMI) 26.0-26.9, adult: Secondary | ICD-10-CM | POA: Diagnosis not present

## 2021-10-30 DIAGNOSIS — M5416 Radiculopathy, lumbar region: Secondary | ICD-10-CM | POA: Diagnosis not present

## 2021-10-31 ENCOUNTER — Other Ambulatory Visit: Payer: Self-pay

## 2021-10-31 ENCOUNTER — Ambulatory Visit: Payer: PPO | Admitting: Nurse Practitioner

## 2021-10-31 ENCOUNTER — Encounter: Payer: Self-pay | Admitting: Nurse Practitioner

## 2021-10-31 VITALS — BP 124/68 | HR 79 | Ht 62.0 in | Wt 146.0 lb

## 2021-10-31 DIAGNOSIS — R0609 Other forms of dyspnea: Secondary | ICD-10-CM | POA: Diagnosis not present

## 2021-10-31 DIAGNOSIS — G4730 Sleep apnea, unspecified: Secondary | ICD-10-CM

## 2021-10-31 DIAGNOSIS — E782 Mixed hyperlipidemia: Secondary | ICD-10-CM | POA: Diagnosis not present

## 2021-10-31 DIAGNOSIS — I10 Essential (primary) hypertension: Secondary | ICD-10-CM

## 2021-10-31 DIAGNOSIS — I5189 Other ill-defined heart diseases: Secondary | ICD-10-CM | POA: Diagnosis not present

## 2021-10-31 NOTE — Patient Instructions (Signed)
Medication Instructions:  ?Your physician recommends that you continue on your current medications as directed. Please refer to the Current Medication list given to you today. ? ?*If you need a refill on your cardiac medications before your next appointment, please call your pharmacy* ? ? ?Lab Work: ?Fasting labs as scheduled in March ? ?If you have labs (blood work) drawn today and your tests are completely normal, you will receive your results only by: ?MyChart Message (if you have MyChart) OR ?A paper copy in the mail ?If you have any lab test that is abnormal or we need to change your treatment, we will call you to review the results. ? ? ?Testing/Procedures: ?None ordered ? ? ?Follow-Up: ?At [facility], you and your health needs are our priority.  As part of our continuing mission to provide you with exceptional heart care, we have created designated Provider Care Teams.  These Care Teams include your primary Cardiologist (physician) and Advanced Practice Providers (APPs -  Physician Assistants and Nurse Practitioners) who all work together to provide you with the care you need, when you need it. ? ?We recommend signing up for the patient portal called "MyChart".  Sign up information is provided on this After Visit Summary.  MyChart is used to connect with patients for Virtual Visits (Telemedicine).  Patients are able to view lab/test results, encounter notes, upcoming appointments, etc.  Non-urgent messages can be sent to your provider as well.   ?To learn more about what you can do with MyChart, go to [email or web address].   ? ?Your next appointment:   ?4 month(s) ? ?The format for your next appointment:   ?In Person ? ?Provider:   ?You may see Nelva Bush, MD or one of the following Advanced Practice Providers on your designated Care Team:   ?Murray Hodgkins, NP ?Christell Faith, PA-C ?Cadence Kathlen Mody, PA-C}  ? ? ?Other Instructions ?N/A ? ?

## 2021-10-31 NOTE — Progress Notes (Signed)
Office Visit    Patient Name: Sarah Todd Date of Encounter: 10/31/2021  Primary Care Provider:  Virginia Crews, MD Primary Cardiologist:  Nelva Bush, MD  Chief Complaint    76 year old female with a history of hypertension, hyperlipidemia, GERD, varicose veins, and arthritis, who presents for follow-up of fatigue and recent stress testing.  Past Medical History    Past Medical History:  Diagnosis Date   Allergy    Arthritis    hands   Diastolic dysfunction    a. 08/2021 Echo: EF 55-60%, no rwma. GrII DD. Nl RV fxn. Mild MR. Ao sclerosis w/o stenosis.   Family history of adverse reaction to anesthesia    sister but not sure of what happened   GERD (gastroesophageal reflux disease)    History of cardiovascular stress test    a. 10/2021 MV: EF 88%. No ischemia/infarct. CT w/ mild Cor Ca2+ and Ao atherosclerosis.  Low risk scan.   Hyperlipidemia    Hypertension    Osteoporosis    Varicose veins    Mild   Past Surgical History:  Procedure Laterality Date   ABDOMINAL HYSTERECTOMY     ANTERIOR CERVICAL DECOMP/DISCECTOMY FUSION N/A 07/21/2019   Procedure: ANTERIOR CERVICAL DECOMPRESSION/DISCECTOMY FUSION CERVICAL THREE- CERVICAL FOUR, CERVICAL FOUR- CERVICAL FIVE, CERVICAL FIVE- CERVICAL SIX;  Surgeon: Jovita Gamma, MD;  Location: [place];  Service: Neurosurgery;  Laterality: N/A;  ANTERIOR CERVICAL DECOMPRESSION/DISCECTOMY FUSION CERVICAL 3- CERVICAL 4, CERVICAL 4- CERVICAL 5, CERVICAL 5- CERVICAL 6   BACK SURGERY     cervical and lumbar     COLONOSCOPY N/A 01/05/2015   Procedure: COLONOSCOPY;  Surgeon: Lucilla Lame, MD;  Location: [place];  Service: Gastroenterology;  Laterality: N/A;   COLONOSCOPY WITH PROPOFOL N/A 01/25/2020   Procedure: COLONOSCOPY WITH PROPOFOL;  Surgeon: Lucilla Lame, MD;  Location: [facility] ENDOSCOPY;  Service: Endoscopy;  Laterality: N/A;   DILATION AND CURETTAGE OF UTERUS     FRACTURE SURGERY     clavicle right   TOOTH  EXTRACTION     TOTAL HIP ARTHROPLASTY Right 12/06/2020   Procedure: TOTAL HIP ARTHROPLASTY ANTERIOR APPROACH;  Surgeon: Lovell Sheehan, MD;  Location: [facility];  Service: Orthopedics;  Laterality: Right;   TUBAL LIGATION      Allergies  Allergies  Allergen Reactions   Lisinopril Anaphylaxis   Shrimp [Shellfish Allergy] Anaphylaxis   Aspercreme [Trolamine Salicylate] Rash    History of Present Illness    76 year old female with the above past medical history including hypertension, hyperlipidemia, GERD, varicose veins, and arthritis.  She establish care with our practice in November 2022 in the setting of progressive fatigue and dyspnea on exertion, which began in April 2022 following right hip surgery.  She was noted to have a new left bundle branch block on ECG.  She was placed on metoprolol succinate 25 mg daily and subsequently underwent echocardiography which showed an EF of 55 to 60% without regional wall motion abnormalities, grade 2 diastolic dysfunction, and mild mitral regurgitation.  At clinic follow-up on January 27, she reported ongoing dyspnea on exertion with minimal activity around the house as well as daytime fatigue and sleepiness.  She reported frequent nocturia as well as a history of snoring.  She was not interested in a sleep study.  Losartan was increased to 100 mg daily in the setting of hypertension.  In the setting of dyspnea on exertion with ongoing risk factors, she subsequently underwent Lexiscan Myoview in early February, which showed normal LV  function without evidence of ischemia or infarct.  Mild coronary calcifications and aortic atherosclerosis was noted and her rosuvastatin dose was increased to 20 mg daily.  Since her last visit, she has been doing well.  At her last visit, we discussed the importance of regular activity, and she has been exercising 30 minutes daily, walking in her yard and up her stairs in her home and with this, she has noted improved activity  tolerance, reduced dyspnea on exertion, and also a reduction in her afternoon fatigue and tiredness.  She denies chest pain, palpitations, PND, orthopnea, dizziness, syncope, edema, or early satiety.  She has tolerated the higher dose of rosuvastatin and is scheduled for labs later this month.  She is also has pulmonology follow-up for sleep eval later this month.  Home Medications    Current Outpatient Medications  Medication Sig Dispense Refill   acetaminophen (TYLENOL) 650 MG CR tablet Take 2,600 mg by mouth daily as needed for pain.     aspirin 81 MG chewable tablet Chew 81 mg by mouth daily.     Cholecalciferol (VITAMIN D) 50 MCG (2000 UT) tablet Take 2,000 Units by mouth daily.     Coenzyme Q10 (COQ-10) 100 MG capsule Take 1 capsule (100 mg total) by mouth daily. 30 capsule 3   doxycycline (MONODOX) 50 MG capsule Take 50 mg by mouth 2 (two) times daily.     estradiol (ESTRACE) 0.5 MG tablet TAKE ONE TABLET EVERY DAY 90 tablet 1   gabapentin (NEURONTIN) 600 MG tablet Take 600-900 mg by mouth See admin instructions. Take 600 mg by mouth in the morning and 900 mg at night     hydrocortisone 2.5 % cream Apply 1 application topically 2 (two) times daily as needed (irritation). 28 g 2   losartan (COZAAR) 100 MG tablet Take 1 tablet (100 mg total) by mouth daily. 30 tablet 6   metoprolol succinate (TOPROL-XL) 25 MG 24 hr tablet Take 1 tablet (25 mg total) by mouth daily. 90 tablet 3   mirabegron ER (MYRBETRIQ) 25 MG TB24 tablet Take 1 tablet (25 mg total) by mouth daily. 30 tablet 3   Multiple Vitamin (MULTIVITAMIN) capsule Take 1 capsule by mouth daily.      Omega-3 Fatty Acids (FISH OIL) 1000 MG CAPS Take 1,000-2,000 mg by mouth See admin instructions. Take 1000 mg in the morning and 2000 mg at night     Probiotic Product (PROBIOTIC PO) Take 1 capsule by mouth daily.     rosuvastatin (CRESTOR) 20 MG tablet Take 1 tablet (20 mg total) by mouth daily. 90 tablet 3   triamcinolone cream (KENALOG)  0.1 % Apply 1 application topically 2 (two) times daily as needed.     vitamin C (ASCORBIC ACID) 500 MG tablet Take 500 mg by mouth daily.      No current facility-administered medications for this visit.     Review of Systems    Significant improvement in activity tolerance and baseline level of dyspnea on exertion.  She still tires some in the afternoon but this is improved.  She denies chest pain, palpitations, PND, orthopnea, dizziness, syncope, edema, or early satiety.  All other systems reviewed and are otherwise negative except as noted above.    Physical Exam    VS:  BP 124/68 (BP Location: Left Arm, Patient Position: Sitting, Cuff Size: Normal)    Pulse 79    Ht 5\' 2"  (1.575 m)    Wt 146 lb (66.2 kg)  SpO2 97%    BMI 26.70 kg/m  , BMI Body mass index is 26.7 kg/m. STOP-Bang Score:  5      GEN: Well nourished, well developed, in no acute distress. HEENT: normal. Neck: Supple, no JVD, carotid bruits, or masses. Cardiac: RRR, no murmurs, rubs, or gallops. No clubbing, cyanosis, edema.  Radials/PT 2+ and equal bilaterally.  Respiratory:  Respirations regular and unlabored, clear to auscultation bilaterally. GI: Soft, nontender, nondistended, BS + x 4. MS: no deformity or atrophy. Skin: warm and dry, no rash. Neuro:  Strength and sensation are intact. Psych: Normal affect.  Accessory Clinical Findings     Lab Results  Component Value Date   WBC 8.6 06/26/2021   HGB 13.4 06/26/2021   HCT 38.8 06/26/2021   MCV 93 06/26/2021   PLT 320 06/26/2021   Lab Results  Component Value Date   CREATININE 0.66 10/04/2021   BUN 9 10/04/2021   NA 135 10/04/2021   K 4.0 10/04/2021   CL 97 10/04/2021   CO2 26 10/04/2021   Lab Results  Component Value Date   ALT 16 07/30/2021   AST 21 07/30/2021   ALKPHOS 55 07/30/2021   BILITOT 0.3 07/30/2021   Lab Results  Component Value Date   CHOL 236 (H) 11/27/2020   HDL 72 11/27/2020   LDLCALC 136 (H) 11/27/2020   TRIG 158 (H)  11/27/2020   CHOLHDL 3.3 11/27/2020    Lab Results  Component Value Date   HGBA1C 5.4 12/18/2017    Assessment & Plan    1.  Dyspnea on exertion/diastolic dysfunction: Persistent dyspnea which is likely multifactorial in the setting of deconditioning following hip surgery last spring associated weight gain, as well as diastolic dysfunction noted on echocardiogram.  She otherwise has normal LV function.  She recently underwent stress testing, which was low risk without evidence of ischemia or infarct.  Since her last visit, she has been exercising 30 minutes a day, walking around her yard and up steps in her home.  With this, she has noted improved activity tolerance and reduction in baseline level of dyspnea on exertion.  She is also down 9 pounds.  She is quite pleased with herself and I congratulated her on this.  She is euvolemic on examination and heart rate and blood pressure are normal.  I encouraged her to continue exercising regularly.  2.  Essential hypertension: Blood pressure 124/68 after increasing her losartan at her last visit.  She also remains on beta-blocker therapy.  Stable renal function and electrolytes in early February.  3.  Hyperlipidemia: LDL 136 in March 2022 with normal LFTs in November.  Stress testing was notable for coronary calcium and I increased her rosuvastatin to 20 mg daily.  She will have follow-up lipids and LFTs later this month.  4.  Sleep disordered breathing: With exercise, she has noted some improvement in her daytime fatigue and sleepiness but still rests in the afternoons.  Her husband told her that she snores and her STOP-BANG equals 5.  She is scheduled to see pulmonology later this month.  5.  Disposition: Follow-up lipids and LFTs later this month.  Follow-up in clinic in 4 months or sooner if necessary.  Murray Hodgkins, NP 10/31/2021, 3:24 PM

## 2021-11-08 DIAGNOSIS — D485 Neoplasm of uncertain behavior of skin: Secondary | ICD-10-CM | POA: Diagnosis not present

## 2021-11-08 DIAGNOSIS — D0439 Carcinoma in situ of skin of other parts of face: Secondary | ICD-10-CM | POA: Diagnosis not present

## 2021-11-12 NOTE — Progress Notes (Signed)
I,Sulibeya S Dimas,acting as a Neurosurgeon for Shirlee Latch, MD.,have documented all relevant documentation on the behalf of Shirlee Latch, MD,as directed by  Shirlee Latch, MD while in the presence of Shirlee Latch, MD.   Complete physical exam   Patient: Sarah Todd   DOB: [birth year]   76 y.o. Female  MRN: [id number] Visit Date: 11/13/2021  Today's healthcare provider: Shirlee Latch, MD   Chief Complaint  Patient presents with   Annual Exam   Subjective    Sarah Todd is a 76 y.o. female who presents today for a complete physical exam.  She reports consuming a general diet. Home exercise routine includes walking 1.5 hrs per week. She generally feels well. She reports sleeping fairly well. She does not have additional problems to discuss today.   HPI  Patient had AWV with [facility] on 10/24/2021  Past Medical History:  Diagnosis Date   Allergy    Arthritis    hands   Diastolic dysfunction    a. 08/2021 Echo: EF 55-60%, no rwma. GrII DD. Nl RV fxn. Mild MR. Ao sclerosis w/o stenosis.   Family history of adverse reaction to anesthesia    sister but not sure of what happened   GERD (gastroesophageal reflux disease)    History of cardiovascular stress test    a. 10/2021 MV: EF 88%. No ischemia/infarct. CT w/ mild Cor Ca2+ and Ao atherosclerosis.  Low risk scan.   Hyperlipidemia    Hypertension    Osteoporosis    Varicose veins    Mild   Past Surgical History:  Procedure Laterality Date   ABDOMINAL HYSTERECTOMY     ANTERIOR CERVICAL DECOMP/DISCECTOMY FUSION N/A 07/21/2019   Procedure: ANTERIOR CERVICAL DECOMPRESSION/DISCECTOMY FUSION CERVICAL THREE- CERVICAL FOUR, CERVICAL FOUR- CERVICAL FIVE, CERVICAL FIVE- CERVICAL SIX;  Surgeon: Shirlean Kelly, MD;  Location: [facility] OR;  Service: Neurosurgery;  Laterality: N/A;  ANTERIOR CERVICAL DECOMPRESSION/DISCECTOMY FUSION CERVICAL 3- CERVICAL 4, CERVICAL 4- CERVICAL 5, CERVICAL 5- CERVICAL 6   BACK SURGERY      cervical and lumbar     COLONOSCOPY N/A 01/05/2015   Procedure: COLONOSCOPY;  Surgeon: Midge Minium, MD;  Location: [facility];  Service: Gastroenterology;  Laterality: N/A;   COLONOSCOPY WITH PROPOFOL N/A 01/25/2020   Procedure: COLONOSCOPY WITH PROPOFOL;  Surgeon: Midge Minium, MD;  Location: [facility];  Service: Endoscopy;  Laterality: N/A;   DILATION AND CURETTAGE OF UTERUS     FRACTURE SURGERY     clavicle right   TOOTH EXTRACTION     TOTAL HIP ARTHROPLASTY Right 12/06/2020   Procedure: TOTAL HIP ARTHROPLASTY ANTERIOR APPROACH;  Surgeon: Lyndle Herrlich, MD;  Location: [facility] ORS;  Service: Orthopedics;  Laterality: Right;   TUBAL LIGATION     Social History   Socioeconomic History   Marital status: Married    Spouse name: Stefhanie Ayres   Number of children: 3   Years of education: 15   Highest education level: Some college, no degree  Occupational History   Occupation: retired  Tobacco Use   Smoking status: Never   Smokeless tobacco: Never  Vaping Use   Vaping Use: Never used  Substance and Sexual Activity   Alcohol use: No   Drug use: No   Sexual activity: Yes  Other Topics Concern   Not on file  Social History Narrative   Not on file   Social Determinants of Health   Financial Resource Strain: Low Risk    Difficulty of Paying Living Expenses:  Not hard at all  Food Insecurity: No Food Insecurity   Worried About Programme researcher, broadcasting/film/video in the Last Year: Never true   Ran Out of Food in the Last Year: Never true  Transportation Needs: No Transportation Needs   Lack of Transportation (Medical): No   Lack of Transportation (Non-Medical): No  Physical Activity: Insufficiently Active   Days of Exercise per Week: 3 days   Minutes of Exercise per Session: 30 min  Stress: No Stress Concern Present   Feeling of Stress : Not at all  Social Connections: Moderately Integrated   Frequency of Communication with Friends and Family: More than three times a week    Frequency of Social Gatherings with Friends and Family: Twice a week   Attends Religious Services: More than 4 times per year   Active Member of [employer] or Organizations: No   Attends Banker Meetings: Never   Marital Status: Married  Catering manager Violence: Not At Risk   Fear of Current or Ex-Partner: No   Emotionally Abused: No   Physically Abused: No   Sexually Abused: No   Family Status  Relation Name Status   Mother  Deceased at age 73       old age   Father  Deceased at age 44   Sister  Alive   Brother  Alive   Neg Hx  (Not Specified)   Family History  Problem Relation Age of Onset   Cancer Mother        colon cancer   Alzheimer's disease Father    Heart disease Father    Hypertension Father    Cancer Sister        uterine   Breast cancer Neg Hx    Allergies  Allergen Reactions   Lisinopril Anaphylaxis   Shrimp [Shellfish Allergy] Anaphylaxis   Aspercreme [Trolamine Salicylate] Rash    Patient Care Team: Erasmo Downer, MD as PCP - General (Family Medicine) End, Cristal Deer, MD as PCP - Cardiology (Cardiology) Eli Phillips, OD as Consulting Physician (Optometry) Linus Salmons, MD (Otolaryngology) Donalee Citrin, MD as Consulting Physician (Neurosurgery) Midge Minium, MD as Consulting Physician (Gastroenterology) Juanell Fairly, MD as Referring Physician (Orthopedic Surgery) Lonell Face, MD as Consulting Physician (Neurology)   Medications: Outpatient Medications Prior to Visit  Medication Sig   acetaminophen (TYLENOL) 650 MG CR tablet Take 2,600 mg by mouth daily as needed for pain.   aspirin 81 MG chewable tablet Chew 81 mg by mouth daily.   Cholecalciferol (VITAMIN D) 50 MCG (2000 UT) tablet Take 2,000 Units by mouth daily.   Coenzyme Q10 (COQ-10) 100 MG capsule Take 1 capsule (100 mg total) by mouth daily.   doxycycline (MONODOX) 50 MG capsule Take 50 mg by mouth 2 (two) times daily.   estradiol (ESTRACE) 0.5 MG tablet  TAKE ONE TABLET EVERY DAY   gabapentin (NEURONTIN) 600 MG tablet Take 600-900 mg by mouth See admin instructions. Take 600 mg by mouth in the morning and 900 mg at night   hydrocortisone 2.5 % cream Apply 1 application topically 2 (two) times daily as needed (irritation).   losartan (COZAAR) 100 MG tablet Take 1 tablet (100 mg total) by mouth daily.   metoprolol succinate (TOPROL-XL) 25 MG 24 hr tablet Take 1 tablet (25 mg total) by mouth daily.   Multiple Vitamin (MULTIVITAMIN) capsule Take 1 capsule by mouth daily.    Omega-3 Fatty Acids (FISH OIL) 1000 MG CAPS Take 1,000-2,000 mg by mouth See admin  instructions. Take 1000 mg in the morning and 2000 mg at night   Probiotic Product (PROBIOTIC PO) Take 1 capsule by mouth daily.   rosuvastatin (CRESTOR) 20 MG tablet Take 1 tablet (20 mg total) by mouth daily.   triamcinolone cream (KENALOG) 0.1 % Apply 1 application topically 2 (two) times daily as needed.   vitamin C (ASCORBIC ACID) 500 MG tablet Take 500 mg by mouth daily.    [DISCONTINUED] mirabegron ER (MYRBETRIQ) 25 MG TB24 tablet Take 1 tablet (25 mg total) by mouth daily.   No facility-administered medications prior to visit.    Review of Systems  Constitutional:  Positive for fatigue.  HENT: Negative.    Eyes:  Positive for discharge.  Gastrointestinal:  Positive for constipation.  Endocrine: Positive for polyuria.  Genitourinary:  Positive for frequency.  Musculoskeletal:  Positive for arthralgias.  All other systems reviewed and are negative.  Last CBC Lab Results  Component Value Date   WBC 8.6 06/26/2021   HGB 13.4 06/26/2021   HCT 38.8 06/26/2021   MCV 93 06/26/2021   MCH 32.2 06/26/2021   RDW 12.1 06/26/2021   PLT 320 06/26/2021   Last metabolic panel Lab Results  Component Value Date   GLUCOSE 100 (H) 10/04/2021   NA 135 10/04/2021   K 4.0 10/04/2021   CL 97 10/04/2021   CO2 26 10/04/2021   BUN 9 10/04/2021   CREATININE 0.66 10/04/2021   EGFR 90 10/04/2021    CALCIUM 9.5 10/04/2021   PROT 7.4 07/30/2021   ALBUMIN 4.7 07/30/2021   LABGLOB 2.7 07/30/2021   AGRATIO 1.7 07/30/2021   BILITOT 0.3 07/30/2021   ALKPHOS 55 07/30/2021   AST 21 07/30/2021   ALT 16 07/30/2021   ANIONGAP 7 12/07/2020   Last lipids Lab Results  Component Value Date   CHOL 236 (H) 11/27/2020   HDL 72 11/27/2020   LDLCALC 136 (H) 11/27/2020   TRIG 158 (H) 11/27/2020   CHOLHDL 3.3 11/27/2020   Last hemoglobin A1c Lab Results  Component Value Date   HGBA1C 5.4 12/18/2017   Last thyroid functions Lab Results  Component Value Date   TSH 4.480 06/26/2021   Last vitamin D Lab Results  Component Value Date   VD25OH 39.5 06/26/2021   Last vitamin B12 and Folate Lab Results  Component Value Date   VITAMINB12 516 06/26/2021   FOLATE >20.0 12/20/2019      Objective    BP 115/73 (BP Location: Left Arm, Patient Position: Sitting, Cuff Size: Large)   Pulse 69   Temp 98 F (36.7 C) (Temporal)   Resp 16   Ht 5\' 1"  (1.549 m)   Wt 147 lb 4.8 oz (66.8 kg)   BMI 27.83 kg/m  BP Readings from Last 3 Encounters:  11/13/21 115/73  10/31/21 124/68  10/24/21 118/68   Wt Readings from Last 3 Encounters:  11/13/21 147 lb 4.8 oz (66.8 kg)  10/31/21 146 lb (66.2 kg)  10/24/21 148 lb 8 oz (67.4 kg)       Physical Exam Vitals reviewed.  Constitutional:      General: She is not in acute distress.    Appearance: Normal appearance. She is well-developed. She is not diaphoretic.  HENT:     Head: Normocephalic and atraumatic.     Right Ear: Tympanic membrane, ear canal and external ear normal.     Left Ear: Tympanic membrane, ear canal and external ear normal.     Nose: Nose normal.  Mouth/Throat:     Mouth: Mucous membranes are moist.     Pharynx: Oropharynx is clear. No oropharyngeal exudate.  Eyes:     General: No scleral icterus.    Conjunctiva/sclera: Conjunctivae normal.     Pupils: Pupils are equal, round, and reactive to light.  Neck:      Thyroid: No thyromegaly.  Cardiovascular:     Rate and Rhythm: Normal rate and regular rhythm.     Pulses: Normal pulses.     Heart sounds: Normal heart sounds. No murmur heard. Pulmonary:     Effort: Pulmonary effort is normal. No respiratory distress.     Breath sounds: Normal breath sounds. No wheezing or rales.  Abdominal:     General: There is no distension.     Palpations: Abdomen is soft.     Tenderness: There is no abdominal tenderness.  Musculoskeletal:        General: No deformity.     Cervical back: Neck supple.     Right lower leg: No edema.     Left lower leg: No edema.  Lymphadenopathy:     Cervical: No cervical adenopathy.  Skin:    General: Skin is warm and dry.     Findings: No rash.  Neurological:     Mental Status: She is alert and oriented to person, place, and time. Mental status is at baseline.     Gait: Gait normal.  Psychiatric:        Mood and Affect: Mood normal.        Behavior: Behavior normal.        Thought Content: Thought content normal.      Last depression screening scores PHQ 2/9 Scores 10/24/2021 06/26/2021 12/01/2020  PHQ - 2 Score 0 0 0  PHQ- 9 Score - 4 3   Last fall risk screening Fall Risk  10/24/2021  Falls in the past year? 0  Number falls in past yr: 0  Injury with Fall? 0  Risk for fall due to : No Fall Risks  Follow up Falls evaluation completed   Last Audit-C alcohol use screening Alcohol Use Disorder Test (AUDIT) 10/24/2021  1. How often do you have a drink containing alcohol? 0  2. How many drinks containing alcohol do you have on a typical day when you are drinking? 0  3. How often do you have six or more drinks on one occasion? 0  AUDIT-C Score 0  Alcohol Brief Interventions/Follow-up -   A score of 3 or more in women, and 4 or more in men indicates increased risk for alcohol abuse, EXCEPT if all of the points are from question 1   No results found for any visits on 11/13/21.  Assessment & Plan    Routine Health  Maintenance and Physical Exam  Exercise Activities and Dietary recommendations  Goals      DIET - EAT MORE FRUITS AND VEGETABLES     DIET - INCREASE WATER INTAKE     Recommend increasing water intake to 6 glasses a day.      Exercise 3x per week (30 min per time)     Recommend to exercise for 3 days a week for at least 30 minutes at a time.         Immunization History  Administered Date(s) Administered   Influenza,inj,Quad PF,6+ Mos 06/15/2015   Influenza-Unspecified 05/13/2017, 06/09/2018, 05/18/2019, 07/28/2020, 07/27/2021   Moderna Sars-Covid-2 Vaccination 10/30/2019, 11/27/2019, 08/11/2020   Pneumococcal Conjugate-13 11/17/2014   Pneumococcal Polysaccharide-23 08/03/2013  Tdap 10/16/2016   Zoster Recombinat (Shingrix) 09/09/2018, 03/09/2019    Health Maintenance  Topic Date Due   COVID-19 Vaccine (4 - Booster for Moderna series) 10/06/2020   COLONOSCOPY (Pts 45-66yrs Insurance coverage will need to be confirmed)  01/24/2025   TETANUS/TDAP  10/16/2026   Pneumonia Vaccine 31+ Years old  Completed   INFLUENZA VACCINE  Completed   DEXA SCAN  Completed   Hepatitis C Screening  Completed   Zoster Vaccines- Shingrix  Completed   HPV VACCINES  Aged Out    Discussed health benefits of physical activity, and encouraged her to engage in regular exercise appropriate for her age and condition.  Problem List Items Addressed This Visit       Cardiovascular and Mediastinum   Essential hypertension    Well controlled Continue current medications Recheck metabolic panel F/u in 6 months       Relevant Orders   Comprehensive metabolic panel     Other   Mixed hyperlipidemia    Previously well controlled Continue statin Repeat FLP and CMP      Relevant Orders   Comprehensive metabolic panel   Lipid panel   Other Visit Diagnoses     Encounter for annual physical exam    -  Primary   Relevant Orders   Comprehensive metabolic panel   Lipid panel   Hemoglobin A1c    Hyperglycemia       Relevant Orders   Hemoglobin A1c        Return in about 6 months (around 05/16/2022) for chronic disease f/u.     I, Shirlee Latch, MD, have reviewed all documentation for this visit. The documentation on 11/13/21 for the exam, diagnosis, procedures, and orders are all accurate and complete.   Peytyn Trine, Marzella Schlein, MD, MPH [facility]

## 2021-11-13 ENCOUNTER — Other Ambulatory Visit: Payer: Self-pay | Admitting: Student

## 2021-11-13 ENCOUNTER — Other Ambulatory Visit: Payer: Self-pay

## 2021-11-13 ENCOUNTER — Ambulatory Visit (INDEPENDENT_AMBULATORY_CARE_PROVIDER_SITE_OTHER): Payer: PPO | Admitting: Family Medicine

## 2021-11-13 ENCOUNTER — Encounter: Payer: Self-pay | Admitting: Family Medicine

## 2021-11-13 VITALS — BP 115/73 | HR 69 | Temp 98.0°F | Resp 16 | Ht 61.0 in | Wt 147.3 lb

## 2021-11-13 DIAGNOSIS — Z0000 Encounter for general adult medical examination without abnormal findings: Secondary | ICD-10-CM | POA: Diagnosis not present

## 2021-11-13 DIAGNOSIS — E782 Mixed hyperlipidemia: Secondary | ICD-10-CM | POA: Diagnosis not present

## 2021-11-13 DIAGNOSIS — R739 Hyperglycemia, unspecified: Secondary | ICD-10-CM

## 2021-11-13 DIAGNOSIS — M5416 Radiculopathy, lumbar region: Secondary | ICD-10-CM

## 2021-11-13 DIAGNOSIS — I10 Essential (primary) hypertension: Secondary | ICD-10-CM | POA: Diagnosis not present

## 2021-11-13 NOTE — Assessment & Plan Note (Signed)
Previously well controlled Continue statin Repeat FLP and CMP  

## 2021-11-13 NOTE — Assessment & Plan Note (Signed)
Well controlled Continue current medications Recheck metabolic panel F/u in 6 months  

## 2021-11-14 ENCOUNTER — Telehealth: Payer: Self-pay

## 2021-11-14 NOTE — Telephone Encounter (Signed)
Called and spoke to patient in regards to upcoming appt. Patient has never had sleep study before. Nothing further needed.  ?

## 2021-11-15 ENCOUNTER — Encounter: Payer: Self-pay | Admitting: Adult Health

## 2021-11-15 ENCOUNTER — Other Ambulatory Visit: Payer: Self-pay

## 2021-11-15 ENCOUNTER — Ambulatory Visit (INDEPENDENT_AMBULATORY_CARE_PROVIDER_SITE_OTHER): Payer: PPO | Admitting: Adult Health

## 2021-11-15 ENCOUNTER — Ambulatory Visit
Admission: RE | Admit: 2021-11-15 | Discharge: 2021-11-15 | Disposition: A | Discharge status: Home / Self Care | Payer: PPO | Attending: Adult Health | Admitting: Adult Health

## 2021-11-15 ENCOUNTER — Ambulatory Visit
Admission: RE | Admit: 2021-11-15 | Discharge: 2021-11-15 | Disposition: A | Discharge status: Home / Self Care | Payer: PPO | Source: Ambulatory Visit | Attending: Adult Health | Admitting: Adult Health

## 2021-11-15 VITALS — BP 130/80 | HR 72 | Temp 97.8°F | Ht 61.0 in | Wt 148.2 lb

## 2021-11-15 DIAGNOSIS — R0602 Shortness of breath: Secondary | ICD-10-CM | POA: Diagnosis not present

## 2021-11-15 DIAGNOSIS — R400 Somnolence: Secondary | ICD-10-CM

## 2021-11-15 DIAGNOSIS — R5383 Other fatigue: Secondary | ICD-10-CM | POA: Diagnosis not present

## 2021-11-15 DIAGNOSIS — M419 Scoliosis, unspecified: Secondary | ICD-10-CM | POA: Diagnosis not present

## 2021-11-15 IMAGING — CR DG CHEST 2V
1 series · 2 of 2 positions shown · non-contrast
Comparison: [DATE]

CLINICAL DATA: 75-year-old female with a history of shortness of
breath

EXAM:
CHEST - 2 VIEW

[Series 1: dg chest 2 view · 0.14mm/px · 2 of 2 slices shown]
[im 1/2]
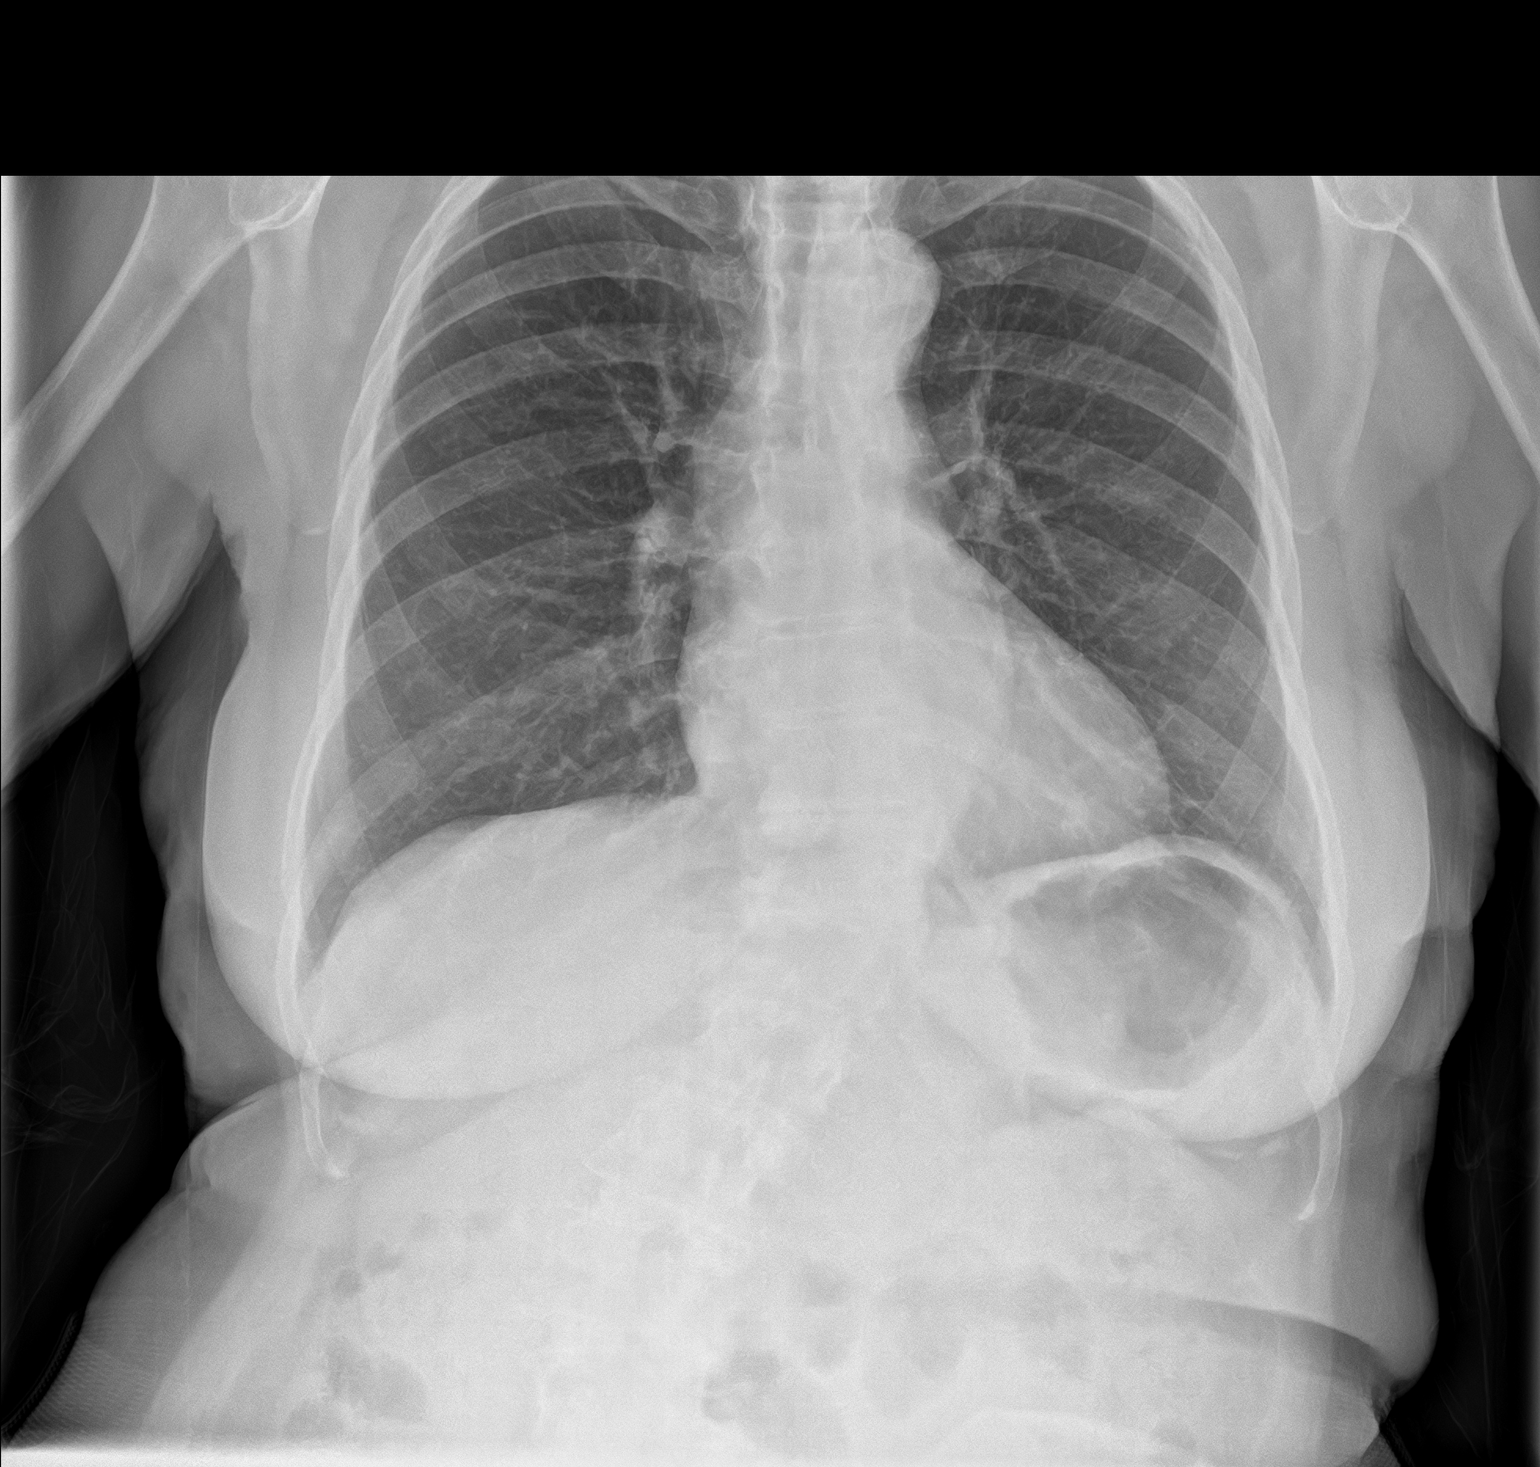
[im 2/2]
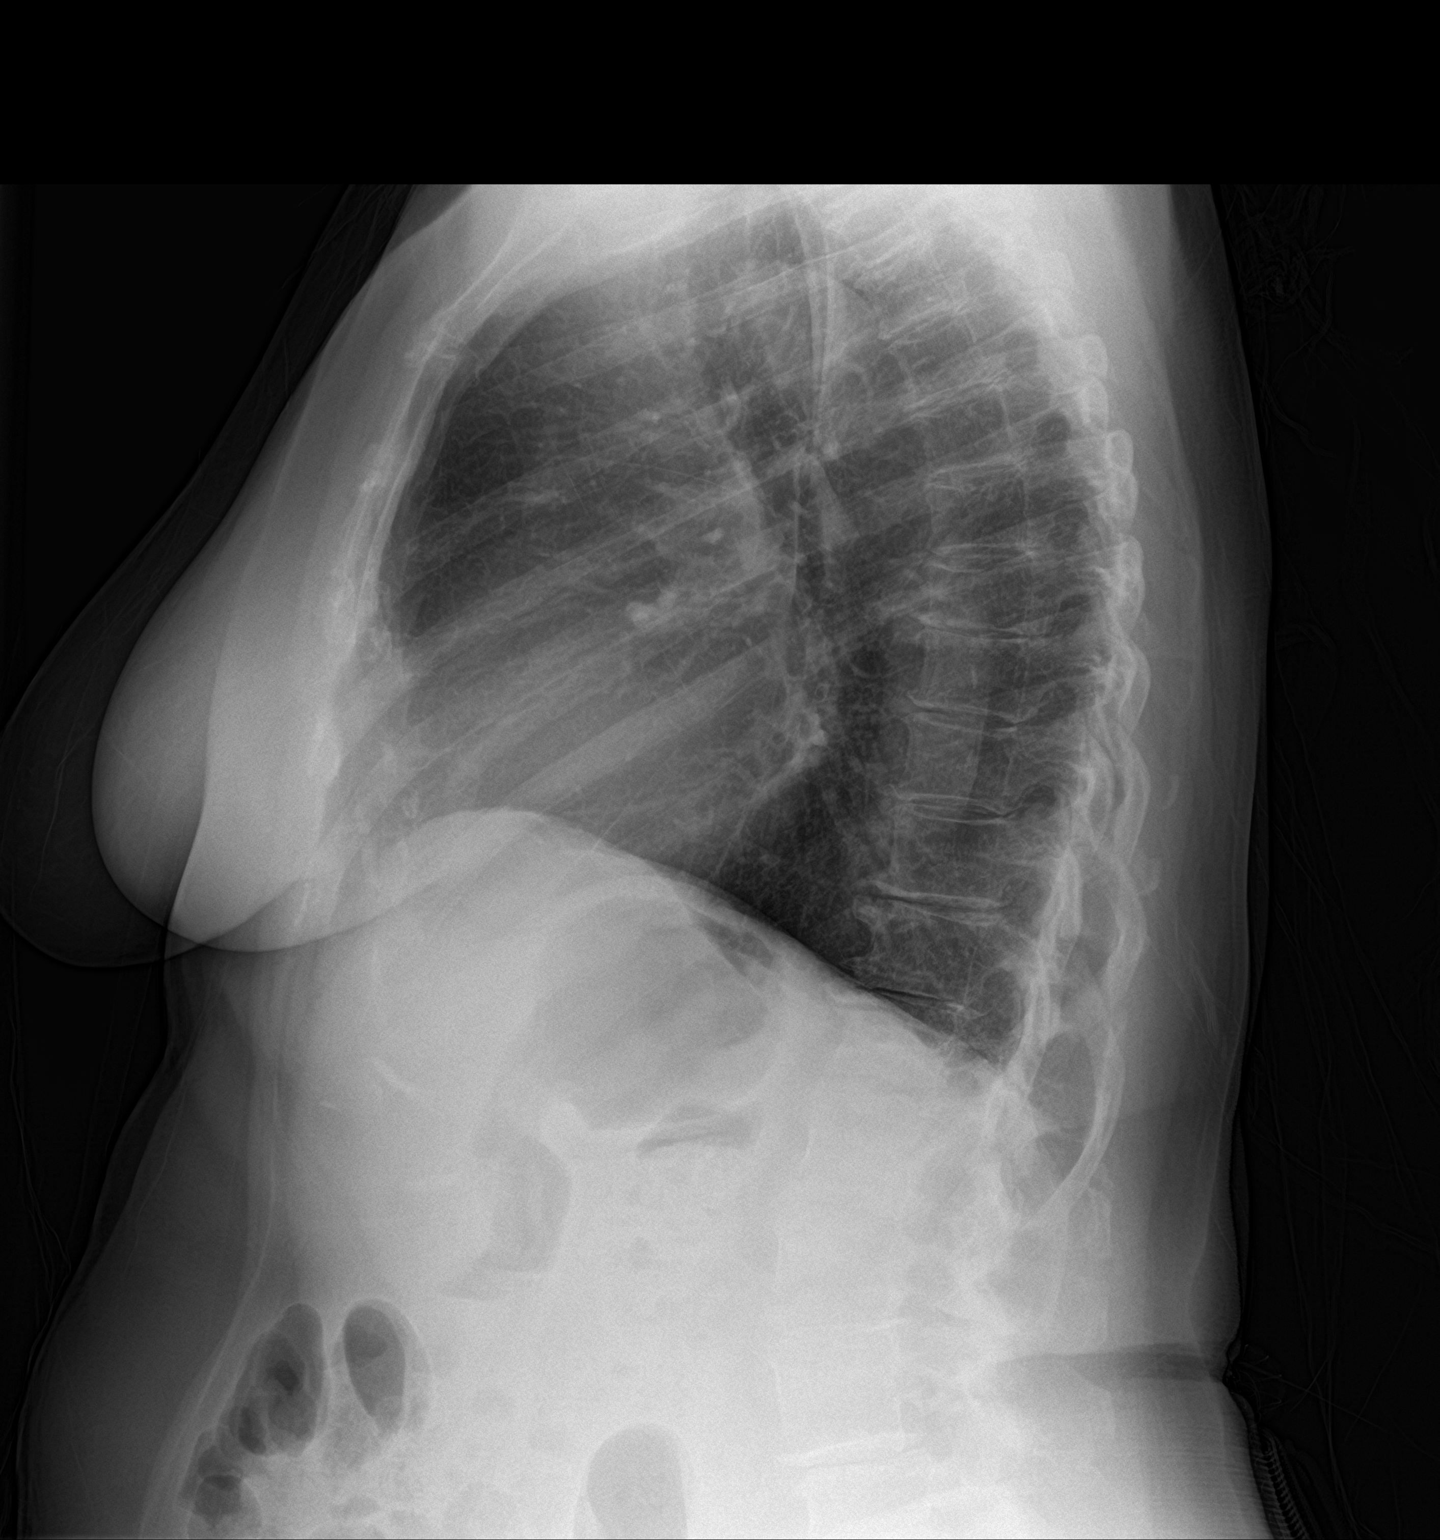

[2 of 2 positions shown; findings below may reference images not displayed]

FINDINGS: Cardiomediastinal silhouette unchanged in size and contour. No
evidence of central vascular congestion. No interlobular septal
thickening.

No pneumothorax or pleural effusion. Coarsened interstitial
markings, with no confluent airspace disease.

No acute displaced fracture. Progressive degenerative scoliotic
curvature compared to the prior
IMPRESSION: Negative for acute cardiopulmonary disease

## 2021-11-15 NOTE — Patient Instructions (Addendum)
Set up for home sleep study.  ?Healthy sleep regimen  ?Do not drive if sleepy .  ?Chest xray today  ?Follow up in 6-8 weeks to discuss results and treatment plan.  ? ?

## 2021-11-15 NOTE — Assessment & Plan Note (Signed)
Chronic fatigue , dyspnea , low stamina. Cardiac workup neg stress test /low risk. Echo showed preserved EF , Gr 2 DD . Suspect component of deconditioning . Possible OSA with daytime fatigue .  ?Will check Chest xray today  ? ?Plan  ?Patient Instructions  ?Set up for home sleep study.  ?Healthy sleep regimen  ?Do not drive if sleepy .  ?Chest xray today  ?Follow up in 6-8 weeks to discuss results and treatment plan.  ? ?  ? ?

## 2021-11-15 NOTE — Progress Notes (Signed)
? ?'@Patient'$  ID: Sarah Todd, female    DOB: 07/30/1946, 76 y.o.   MRN: [id number] ? ?Chief Complaint  ?Patient presents with  ? Consult  ? ? ?Referring provider: ?Wilder Glade* ? ?HPI: ?76 year old female seen for sleep consult November 15, 2021 for daytime sleepiness, snoring , fatigue , low energy for 1 year.  ? ?TEST/EVENTS :  ?Echo 08/2021- EF 55-60%, Gr II DD , RV size normal , normal fnx.  ?Cardiac stress test 10/04/21 - low risk  ? ? ?11/15/2021 Sleep Consult  ?Patient presents for a sleep consult.  She complains of daytime sleepiness and restless sleep. She has been having ongoing fatigue, low energy and restless sleep that began after hip surgery 12/2020. Felt like her breathing and energy level wore out , she would have to lie down and rest . Once she rested she was better. No cough , no wheezing . No hemoptysis , weight loss. Feels like energy level is slightly better last few weeks.  ?Typically goes to bed about 11 PM.  Takes about 15 minutes to go to sleep.  Is up 3-4 times a night.  Gets up about 8 AM.  Does not operate heavy machinery.  Weight is up about 20 pounds over the last 2 years.  Has never had a sleep study before. ?Caffeine intake 2 cups of coffee. Occasional tea /soda  ?No symptoms suspicious for cataplexy or sleep paralysis.  Epworth score is 5.  Mainly gets sleepy when she is an active such as watching TV or reading. Falls asleep watching TV some.  ?Due to her ongoing fatigue she was referred to cardiology . She underwent cardiac stress test that was unrevealing .  ?Takes Gabapentin '600mg'$  in am and 900 pm for leg pain from previous back surgery 2002. Has chronic leg pain and neuropathy in right leg.  ? ?Medical history: Diastolic dysfunction, GERD, hyperlipidemia, hypertension, osteoporosis, arthritis, chronic back and leg pain, neuropathy.  ?No history of stroke .  ? ?Family history significant for cancer, dementia, heart disease, hypertension,  ? ?Social history patient is  married.  Has 3 children.  Never smoker.  No alcohol or drug use.  She lives with her husband and son. Travels some . Retired - Agricultural consultant.  ? ?Surgical history patient has had neck and back surgery before.  She has had a hysterectomy.  Hip replacement surgery 12/2020.  ? ? ? ? ?Allergies  ?Allergen Reactions  ? Lisinopril Anaphylaxis  ? Shrimp [Shellfish Allergy] Anaphylaxis  ? Aspercreme [Trolamine Salicylate] Rash  ? ? ?Immunization History  ?Administered Date(s) Administered  ? Influenza,inj,Quad PF,6+ Mos 06/15/2015  ? Influenza-Unspecified 05/13/2017, 06/09/2018, 05/18/2019, 07/28/2020, 07/27/2021  ? Moderna Sars-Covid-2 Vaccination 10/30/2019, 11/27/2019, 08/11/2020  ? Pneumococcal Conjugate-13 11/17/2014  ? Pneumococcal Polysaccharide-23 08/03/2013  ? Tdap 10/16/2016  ? Zoster Recombinat (Shingrix) 09/09/2018, 03/09/2019  ? ? ?Past Medical History:  ?Diagnosis Date  ? Allergy   ? Arthritis   ? hands  ? Diastolic dysfunction   ? a. 08/2021 Echo: EF 55-60%, no rwma. GrII DD. Nl RV fxn. Mild MR. Ao sclerosis w/o stenosis.  ? Family history of adverse reaction to anesthesia   ? sister but not sure of what happened  ? GERD (gastroesophageal reflux disease)   ? History of cardiovascular stress test   ? a. 10/2021 MV: EF 88%. No ischemia/infarct. CT w/ mild Cor Ca2+ and Ao atherosclerosis.  Low risk scan.  ? Hyperlipidemia   ? Hypertension   ? Osteoporosis   ?  Varicose veins   ? Mild  ? ? ?Tobacco History: ?Social History  ? ?Tobacco Use  ?Smoking Status Never  ?Smokeless Tobacco Never  ? ?Counseling given: Not Answered ? ? ?Outpatient Medications Prior to Visit  ?Medication Sig Dispense Refill  ? acetaminophen (TYLENOL) 650 MG CR tablet Take 2,600 mg by mouth daily as needed for pain.    ? aspirin 81 MG chewable tablet Chew 81 mg by mouth daily.    ? Cholecalciferol (VITAMIN D) 50 MCG (2000 UT) tablet Take 2,000 Units by mouth daily.    ? Coenzyme Q10 (COQ-10) 100 MG capsule Take 1 capsule (100 mg total) by  mouth daily. 30 capsule 3  ? doxycycline (MONODOX) 50 MG capsule Take 50 mg by mouth 2 (two) times daily.    ? estradiol (ESTRACE) 0.5 MG tablet TAKE ONE TABLET EVERY DAY 90 tablet 1  ? gabapentin (NEURONTIN) 600 MG tablet Take 600-900 mg by mouth See admin instructions. Take 600 mg by mouth in the morning and 900 mg at night    ? hydrocortisone 2.5 % cream Apply 1 application topically 2 (two) times daily as needed (irritation). 28 g 2  ? losartan (COZAAR) 100 MG tablet Take 1 tablet (100 mg total) by mouth daily. 30 tablet 6  ? metoprolol succinate (TOPROL-XL) 25 MG 24 hr tablet Take 1 tablet (25 mg total) by mouth daily. 90 tablet 3  ? Multiple Vitamin (MULTIVITAMIN) capsule Take 1 capsule by mouth daily.     ? Omega-3 Fatty Acids (FISH OIL) 1000 MG CAPS Take 1,000-2,000 mg by mouth See admin instructions. Take 1000 mg in the morning and 2000 mg at night    ? Probiotic Product (PROBIOTIC PO) Take 1 capsule by mouth daily.    ? rosuvastatin (CRESTOR) 20 MG tablet Take 1 tablet (20 mg total) by mouth daily. 90 tablet 3  ? triamcinolone cream (KENALOG) 0.1 % Apply 1 application topically 2 (two) times daily as needed.    ? vitamin C (ASCORBIC ACID) 500 MG tablet Take 500 mg by mouth daily.     ? ?No facility-administered medications prior to visit.  ? ? ? ?Review of Systems:  ? ?Constitutional:   No  weight loss, night sweats,  Fevers, chills, +fatigue, or  lassitude. ? ?HEENT:   No headaches,  Difficulty swallowing,  Tooth/dental problems, or  Sore throat,  ?              No sneezing, itching, ear ache, nasal congestion, post nasal drip,  ? ?CV:  No chest pain,  Orthopnea, PND, swelling in lower extremities, anasarca, dizziness, palpitations, syncope.  ? ?GI  No heartburn, indigestion, abdominal pain, nausea, vomiting, diarrhea, change in bowel habits, loss of appetite, bloody stools.  ? ?Resp:   No excess mucus, no productive cough,  No non-productive cough,  No coughing up of blood.  No change in color of mucus.   No wheezing.  No chest wall deformity ? ?Skin: no rash or lesions. ? ?GU: no dysuria, change in color of urine, no urgency or frequency.  No flank pain, no hematuria  ? ?MS:  No joint pain or swelling.  No decreased range of motion.  No back pain. ? ? ? ?Physical Exam ? ?BP 130/80 (BP Location: Left Arm, Patient Position: Sitting, Cuff Size: Normal)   Pulse 72   Temp 97.8 ?F (36.6 ?C) (Oral)   Ht '5\' 1"'$  (1.549 m)   Wt 148 lb 3.2 oz (67.2 kg)   SpO2  97%   BMI 28.00 kg/m?  ? ?GEN: A/Ox3; pleasant , NAD, well nourished  ?  ?HEENT:  Point/AT,  EACs-clear, TMs-wnl, NOSE-clear, THROAT-clear, no lesions, no postnasal drip or exudate noted. Class 2-3 MP airway  ? ?NECK:  Supple w/ fair ROM; no JVD; normal carotid impulses w/o bruits; no thyromegaly or nodules palpated; no lymphadenopathy.   ? ?RESP  Clear  P & A; w/o, wheezes/ rales/ or rhonchi. no accessory muscle use, no dullness to percussion ? ?CARD:  RRR, no m/r/g, tr peripheral edema, pulses intact, no cyanosis or clubbing. ? ?GI:   Soft & nt; nml bowel sounds; no organomegaly or masses detected.  ? ?Musco: Warm bil, no deformities or joint swelling noted.  ? ?Neuro: alert, no focal deficits noted.   ? ?Skin: Warm, no lesions or rashes ? ? ? ?Lab Results: ? ?CBC ? ? ? ? ?BNP ?No results found for: BNP ? ?ProBNP ?No results found for: PROBNP ? ?Imaging: ?No results found. ? ? ? ?No flowsheet data found. ? ?No results found for: NITRICOXIDE ? ? ? ? ? ?Assessment & Plan:  ? ?Daytime sleepiness ?Daytime sleepiness, snoring , restless sleep all suspicious for OSA  ?- discussed how weight can impact sleep and risk for sleep disordered breathing ?- discussed options to assist with weight loss: combination of diet modification, cardiovascular and strength training exercises ?  ?- had an extensive discussion regarding the adverse health consequences related to untreated sleep disordered breathing ?- specifically discussed the risks for hypertension, coronary artery disease,  cardiac dysrhythmias, cerebrovascular disease, and diabetes ?- lifestyle modification discussed ?  ?- discussed how sleep disruption can increase risk of accidents, particularly when driving ?- safe driving pra

## 2021-11-15 NOTE — Assessment & Plan Note (Signed)
Daytime sleepiness, snoring , restless sleep all suspicious for OSA  ?- discussed how weight can impact sleep and risk for sleep disordered breathing ?- discussed options to assist with weight loss: combination of diet modification, cardiovascular and strength training exercises ?  ?- had an extensive discussion regarding the adverse health consequences related to untreated sleep disordered breathing ?- specifically discussed the risks for hypertension, coronary artery disease, cardiac dysrhythmias, cerebrovascular disease, and diabetes ?- lifestyle modification discussed ?  ?- discussed how sleep disruption can increase risk of accidents, particularly when driving ?- safe driving practices were discussed ?  ? ?Set up home sleep study  ? ?Plan  ?Patient Instructions  ?Set up for home sleep study.  ?Healthy sleep regimen  ?Do not drive if sleepy .  ?Chest xray today  ?Follow up in 6-8 weeks to discuss results and treatment plan.  ? ?  ? ?

## 2021-11-15 NOTE — Progress Notes (Signed)
Reviewed and agree with assessment/plan. ? ? ?Chesley Mires, MD ?[place] ?11/15/2021, 3:33 PM ?Pager:  [phone] ? ?

## 2021-11-16 ENCOUNTER — Telehealth: Payer: Self-pay

## 2021-11-16 NOTE — Telephone Encounter (Signed)
Copied from [place] [phone]. Topic: General - Other ?>> Nov 16, 2021 11:52 AM McGill, Nelva Bush wrote: ?Reason for CRM: Pt stated she is having labs done on 11/21/2021 pt stated her PCP gave her a sheet with lab information, and pt is unsure if she should bring that paper back to PCP after labs or if labs will automatically be sent to her PCP.? ? ?Pt is requesting a call back for clarification. ?

## 2021-11-16 NOTE — Telephone Encounter (Signed)
Patient advised that lab results should come to Dr. Jacinto Reap. ?

## 2021-11-21 ENCOUNTER — Other Ambulatory Visit
Admission: RE | Admit: 2021-11-21 | Discharge: 2021-11-21 | Disposition: A | Discharge status: Home / Self Care | Payer: PPO | Attending: Family Medicine | Admitting: Family Medicine

## 2021-11-21 ENCOUNTER — Other Ambulatory Visit
Admission: RE | Admit: 2021-11-21 | Discharge: 2021-11-21 | Disposition: A | Discharge status: Home / Self Care | Payer: PPO | Source: Home / Self Care | Attending: Nurse Practitioner | Admitting: Nurse Practitioner

## 2021-11-21 ENCOUNTER — Other Ambulatory Visit: Payer: PPO

## 2021-11-21 ENCOUNTER — Other Ambulatory Visit: Payer: Self-pay

## 2021-11-21 DIAGNOSIS — R739 Hyperglycemia, unspecified: Secondary | ICD-10-CM | POA: Insufficient documentation

## 2021-11-21 DIAGNOSIS — E782 Mixed hyperlipidemia: Secondary | ICD-10-CM

## 2021-11-21 DIAGNOSIS — I10 Essential (primary) hypertension: Secondary | ICD-10-CM | POA: Diagnosis not present

## 2021-11-21 DIAGNOSIS — Z0000 Encounter for general adult medical examination without abnormal findings: Secondary | ICD-10-CM | POA: Insufficient documentation

## 2021-11-21 LAB — HEMOGLOBIN A1C
Hgb A1c MFr Bld: 5.6 % (ref 4.8–5.6)
Mean Plasma Glucose: 114 mg/dL

## 2021-11-21 LAB — LIPID PANEL
Cholesterol: 125 mg/dL (ref 0–200)
HDL: 66 mg/dL (ref >40)
LDL Cholesterol: 41 mg/dL (ref 0–99)
Total CHOL/HDL Ratio: 1.9 RATIO
Triglycerides: 88 mg/dL (ref <150)
VLDL: 18 mg/dL (ref 0–40)

## 2021-11-21 LAB — COMPREHENSIVE METABOLIC PANEL
ALT: 21 U/L (ref 0–44)
AST: 25 U/L (ref 15–41)
Albumin: 4.4 g/dL (ref 3.5–5.0)
Alkaline Phosphatase: 38 U/L (ref 38–126)
Anion gap: 6 (ref 5–15)
BUN: 14 mg/dL (ref 8–23)
CO2: 28 mmol/L (ref 22–32)
Calcium: 10 mg/dL (ref 8.9–10.3)
Chloride: 101 mmol/L (ref 98–111)
Creatinine, Ser: 0.67 mg/dL (ref 0.44–1.00)
GFR, Estimated: >60 mL/min (ref >60)
Glucose, Bld: 100 mg/dL — ABNORMAL HIGH (ref 70–99)
Potassium: 4.2 mmol/L (ref 3.5–5.1)
Sodium: 135 mmol/L (ref 135–145)
Total Bilirubin: 0.7 mg/dL (ref 0.3–1.2)
Total Protein: 7.6 g/dL (ref 6.5–8.1)

## 2021-11-21 LAB — BILIRUBIN, DIRECT: Bilirubin, Direct: <0.1 mg/dL (ref 0.0–0.2)

## 2021-11-21 NOTE — Progress Notes (Signed)
d 

## 2021-11-22 ENCOUNTER — Ambulatory Visit: Payer: PPO | Admitting: Family Medicine

## 2021-11-28 DIAGNOSIS — D0439 Carcinoma in situ of skin of other parts of face: Secondary | ICD-10-CM | POA: Diagnosis not present

## 2021-11-30 DIAGNOSIS — M3501 Sicca syndrome with keratoconjunctivitis: Secondary | ICD-10-CM | POA: Diagnosis not present

## 2021-12-04 ENCOUNTER — Ambulatory Visit
Admission: RE | Admit: 2021-12-04 | Discharge: 2021-12-04 | Disposition: A | Discharge status: Home / Self Care | Payer: PPO | Source: Ambulatory Visit | Attending: Student | Admitting: Student

## 2021-12-04 DIAGNOSIS — M5416 Radiculopathy, lumbar region: Secondary | ICD-10-CM

## 2021-12-04 DIAGNOSIS — M48061 Spinal stenosis, lumbar region without neurogenic claudication: Secondary | ICD-10-CM | POA: Diagnosis not present

## 2021-12-04 DIAGNOSIS — M4186 Other forms of scoliosis, lumbar region: Secondary | ICD-10-CM | POA: Diagnosis not present

## 2021-12-04 DIAGNOSIS — M4316 Spondylolisthesis, lumbar region: Secondary | ICD-10-CM | POA: Diagnosis not present

## 2021-12-04 DIAGNOSIS — M5126 Other intervertebral disc displacement, lumbar region: Secondary | ICD-10-CM | POA: Diagnosis not present

## 2021-12-04 IMAGING — MR MR LUMBAR SPINE WO/W CM
5 of 9 series · 21 of 48 positions shown · IV contrast (multihance)
Comparison: [DATE]

CLINICAL DATA: Right lateral leg and foot pain.

EXAM:
MRI LUMBAR SPINE WITHOUT AND WITH CONTRAST
TECHNIQUE: Multiplanar and multiecho pulse sequences of the lumbar spine were
obtained without and with intravenous contrast.
CONTRAST:  18mL MULTIHANCE GADOBENATE DIMEGLUMINE 529 MG/ML IV SOLN

[Series 7: T1 · sagittal · 4.0mm · 0.73mm/px · 4 of 19 slices shown]
[im 1/19]
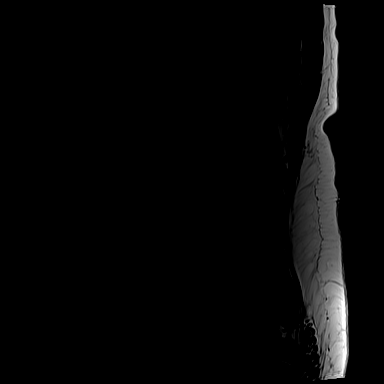
[im 7/19]
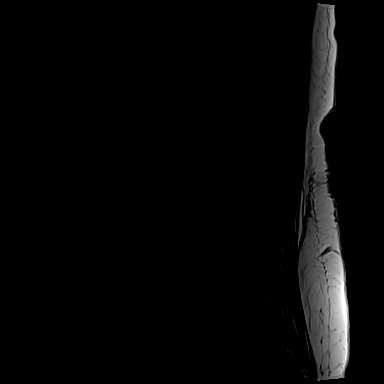
[im 13/19]
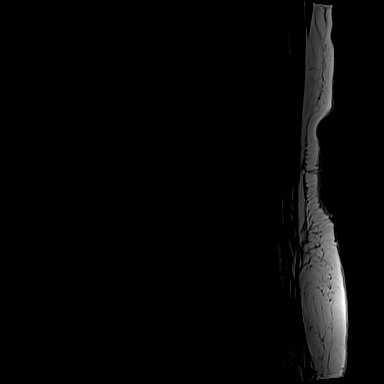
[im 19/19]
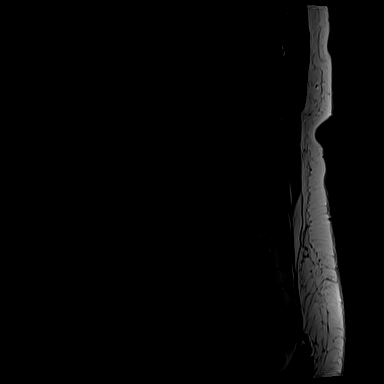

[Series 9: T2 · coronal · 5.0mm · 0.73mm/px · 4 of 16 slices shown (1 of 2)]
[im 1/16]
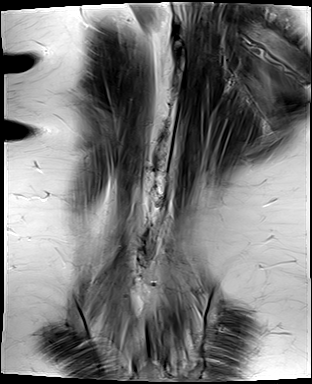
[im 6/16]
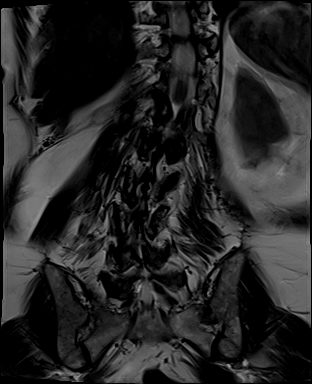
[im 11/16]
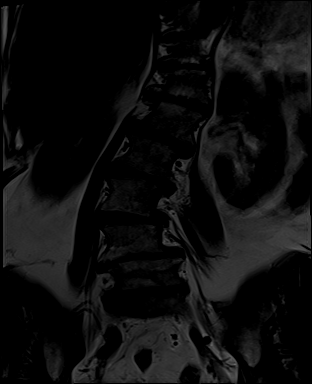
[im 16/16]
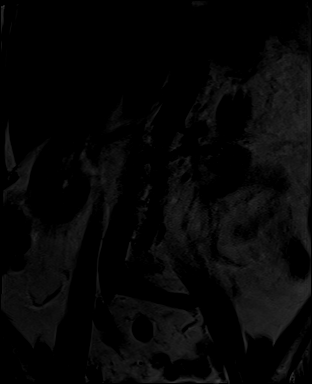

[Series 12: T2 · axial · 4.0mm · 0.28mm/px · z∈[-7,+196]mm · 8 of 35 slices shown (2 of 2)]
[im 1/35]
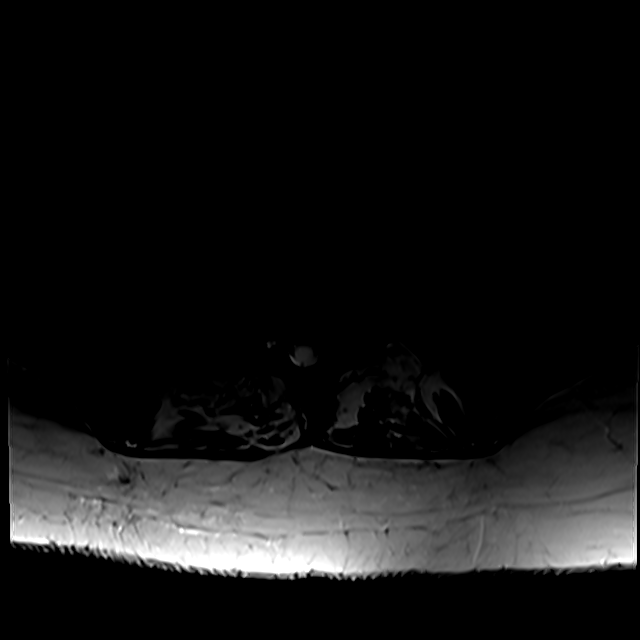
[im 5/35]
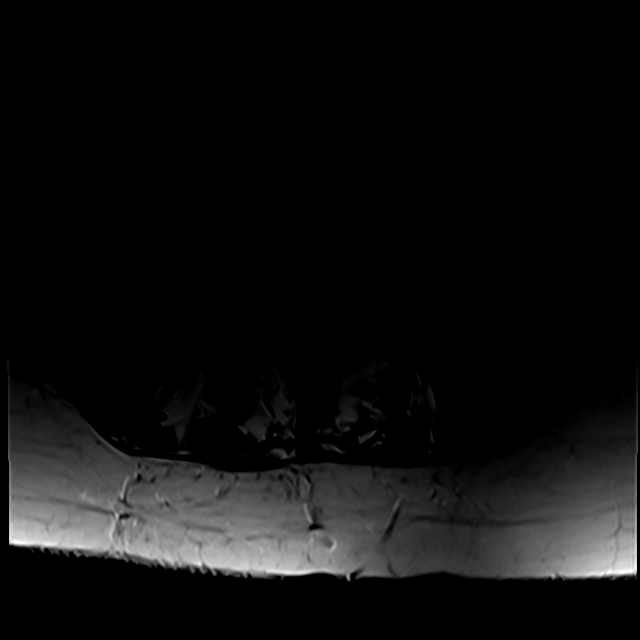
[im 10/35]
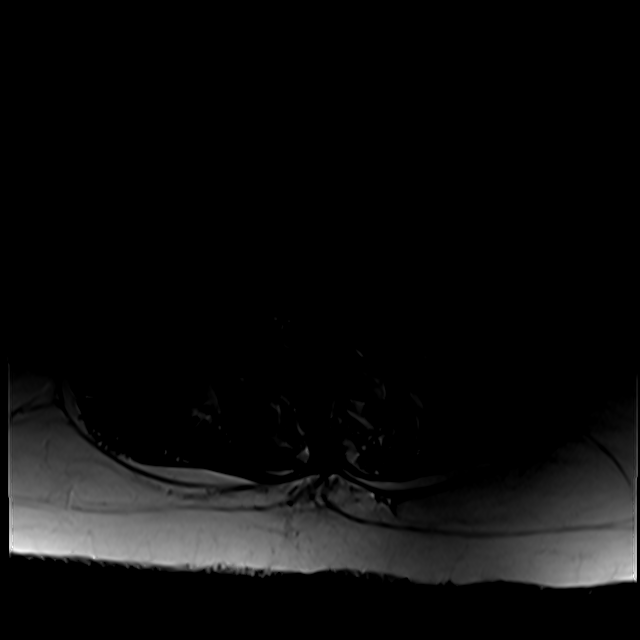
[im 15/35]
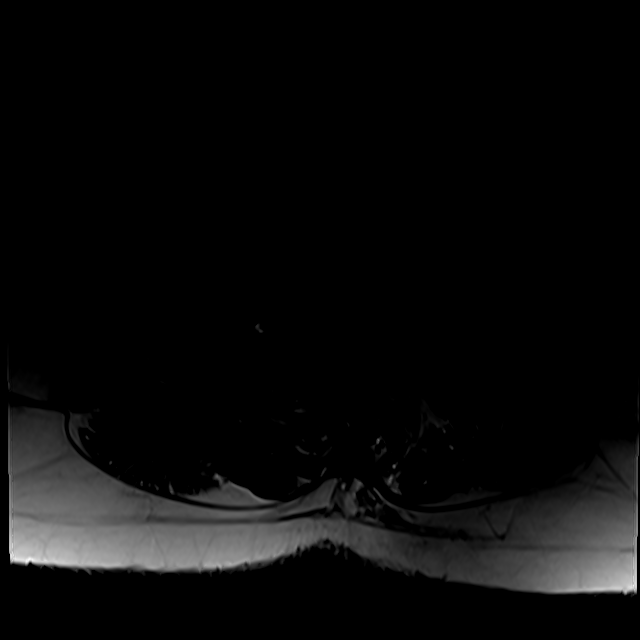
[im 20/35]
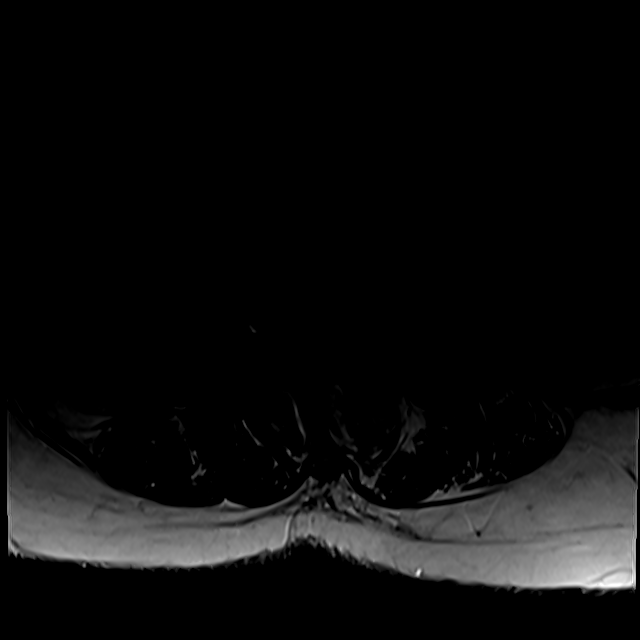
[im 25/35]
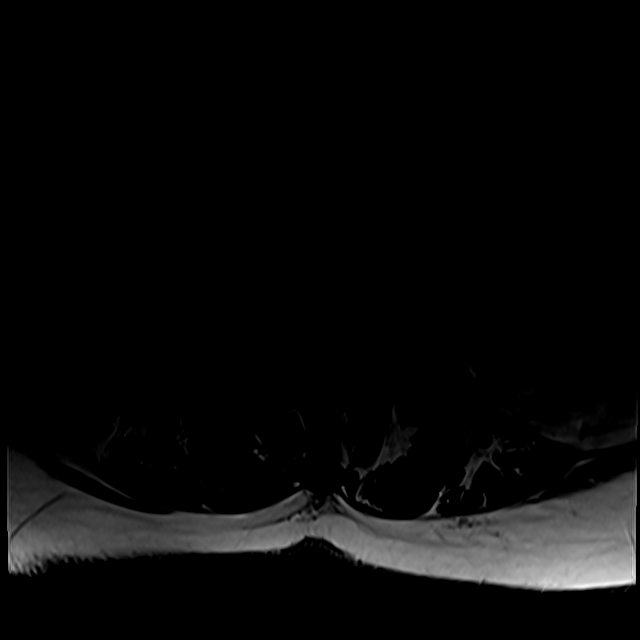
[im 30/35]
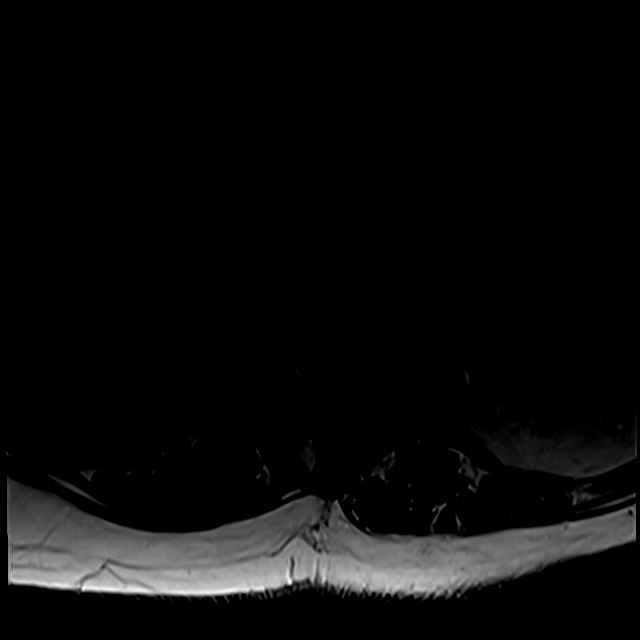
[im 35/35]
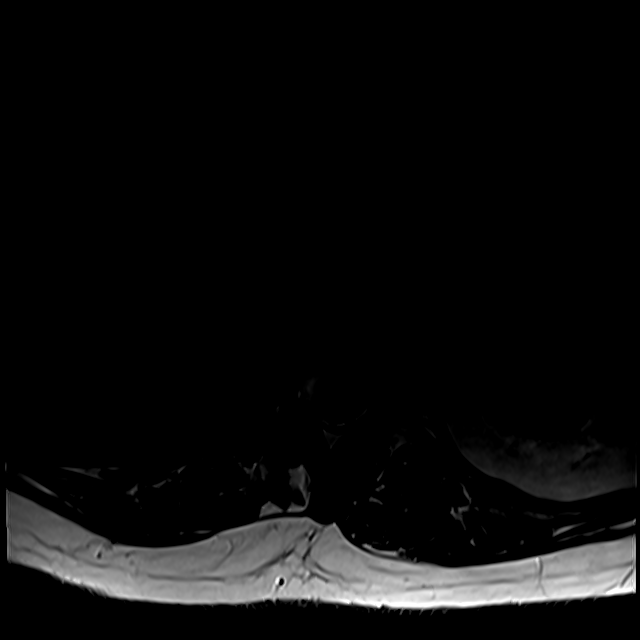

[Series 15: T2 post-contrast · sagittal · 4.0mm · 0.73mm/px · 4 of 19 slices shown]
[im 1/19]
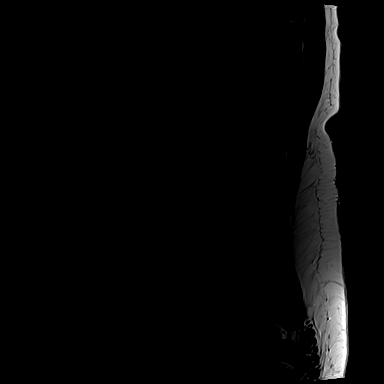
[im 7/19]
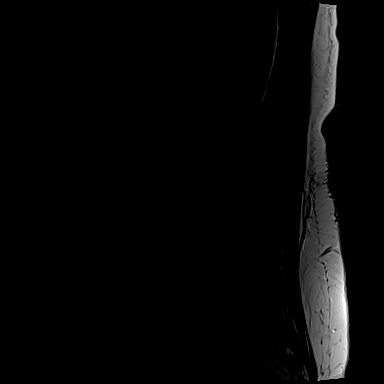
[im 13/19]
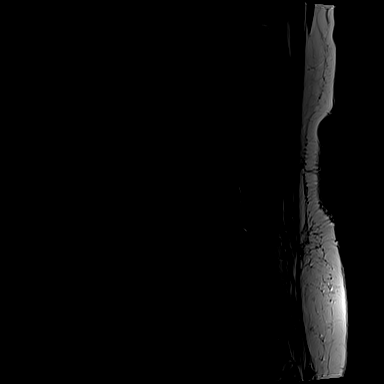
[im 19/19]
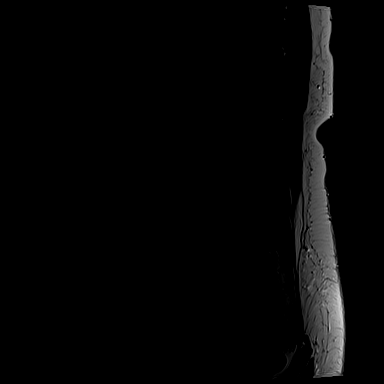

[Series 16: T1 fat-sat post-contrast · sagittal · 4.0mm · 0.73mm/px · 1 of 19 slices shown]
[im 1/19]
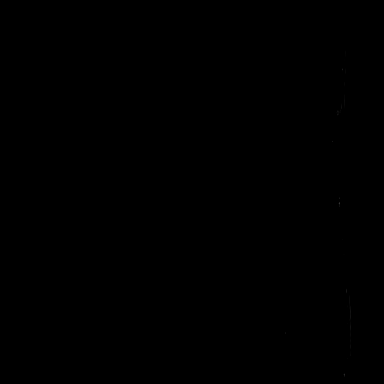

[21 of 48 positions shown; findings below may reference images not displayed]

FINDINGS: Segmentation:  5 lumbar type vertebrae as previously numbered

Alignment: Dextroscoliosis the lumbar spine with L3-4
anterolisthesis.

Vertebrae:  No fracture, evidence of discitis, or bone lesion.

Conus medullaris and cauda equina: Conus extends to the L1 level.
Conus and cauda equina appear normal.

Paraspinal and other soft tissues: Mild scarring in the lower lumbar
posterior soft tissues.

Disc levels:

T10-11 to T11-12: Asymmetric rightward disc narrowing and bulging
with endplate and facet spurring. Right foraminal impingement at
T12-L1.

L1-L2: Disc narrowing and bulging with degenerative facet spurring.
At least moderate left foraminal stenosis.

L2-L3: Degenerative facet spurring and ligamentum flavum thickening.
Circumferential disc bulging. Moderate left foraminal and
subarticular recess stenosis.

L3-L4: Degenerative facet spurring with anterolisthesis. The disc is
narrowed and bulging eccentric to the left where there is mild
foraminal stenosis. Moderate left subarticular recess narrowing

L4-L5: Disc narrowing and bulging with endplate and facet spurring.
Moderate spinal stenosis with asymmetric left subarticular recess
narrowing. Mild bilateral foraminal narrowing

L5-S1:Disc narrowing and bulging with right foraminal protrusion and
asymmetric facet spurring. Prior right laminotomy with patent
subarticular recess. Right foraminal impingement.
IMPRESSION: 1. Generalized lumbar spine degeneration with scoliosis and L3-4
anterolisthesis.
2. Chronic foraminal impingement on the symptomatic right side at
T12-L1 and L5-S1.
3. Moderate spinal stenosis at L4-5 and moderate left subarticular
recess narrowing at L2-3 and L3-4.
4. No focal or notable change since [DATE].

## 2021-12-04 MED ORDER — GADOBENATE DIMEGLUMINE 529 MG/ML IV SOLN
18.0000 mL | Freq: Once | INTRAVENOUS | Status: AC | PRN
  Administered 2021-12-04: 18 mL via INTRAVENOUS

## 2021-12-11 DIAGNOSIS — Z6826 Body mass index (BMI) 26.0-26.9, adult: Secondary | ICD-10-CM | POA: Diagnosis not present

## 2021-12-11 DIAGNOSIS — M5416 Radiculopathy, lumbar region: Secondary | ICD-10-CM | POA: Diagnosis not present

## 2021-12-14 DIAGNOSIS — M545 Low back pain, unspecified: Secondary | ICD-10-CM | POA: Diagnosis not present

## 2021-12-14 DIAGNOSIS — Z96641 Presence of right artificial hip joint: Secondary | ICD-10-CM | POA: Diagnosis not present

## 2022-01-03 DIAGNOSIS — M3501 Sicca syndrome with keratoconjunctivitis: Secondary | ICD-10-CM | POA: Diagnosis not present

## 2022-01-11 ENCOUNTER — Other Ambulatory Visit: Payer: Self-pay | Admitting: Family Medicine

## 2022-01-11 DIAGNOSIS — Z1231 Encounter for screening mammogram for malignant neoplasm of breast: Secondary | ICD-10-CM

## 2022-01-15 DIAGNOSIS — M961 Postlaminectomy syndrome, not elsewhere classified: Secondary | ICD-10-CM | POA: Diagnosis not present

## 2022-01-15 DIAGNOSIS — M5416 Radiculopathy, lumbar region: Secondary | ICD-10-CM | POA: Diagnosis not present

## 2022-01-16 ENCOUNTER — Telehealth: Payer: Self-pay | Admitting: Adult Health

## 2022-01-18 NOTE — Telephone Encounter (Signed)
I have spoke with Sarah Todd and she has been scheduled to  pick up the HST machine on 01/30/22 @ 2:00pm

## 2022-01-30 ENCOUNTER — Ambulatory Visit: Payer: PPO

## 2022-01-30 DIAGNOSIS — G4733 Obstructive sleep apnea (adult) (pediatric): Secondary | ICD-10-CM

## 2022-01-30 DIAGNOSIS — R400 Somnolence: Secondary | ICD-10-CM

## 2022-01-31 ENCOUNTER — Other Ambulatory Visit: Payer: Self-pay | Admitting: Family Medicine

## 2022-01-31 ENCOUNTER — Ambulatory Visit (INDEPENDENT_AMBULATORY_CARE_PROVIDER_SITE_OTHER): Payer: PPO | Admitting: Physician Assistant

## 2022-01-31 ENCOUNTER — Telehealth: Payer: Self-pay | Admitting: Adult Health

## 2022-01-31 ENCOUNTER — Encounter: Payer: Self-pay | Admitting: Physician Assistant

## 2022-01-31 VITALS — BP 126/86 | HR 75 | Temp 97.6°F | Resp 16 | Wt 142.7 lb

## 2022-01-31 DIAGNOSIS — R1010 Upper abdominal pain, unspecified: Secondary | ICD-10-CM

## 2022-01-31 MED ORDER — OMEPRAZOLE 20 MG PO CPDR: 20.0000 mg | DELAYED_RELEASE_CAPSULE | Freq: Every day | ORAL | 3 refills | Status: DC

## 2022-01-31 NOTE — Telephone Encounter (Signed)
Patient called me directly and she stated she would bring the HST machine back at 2:00pm. I have offered to download while she is here to confirm if the sleep study was done correctly

## 2022-01-31 NOTE — Progress Notes (Unsigned)
I,[name],acting as a Education administrator for [employer], PA-C.,have documented all relevant documentation on the behalf of [name], PA-C,as directed by  [employer], PA-C while in the presence of [employer], PA-C.   Established patient visit   Patient: Sarah Todd   DOB: [birth year]   76 y.o. Female  MRN: [id number] Visit Date: 01/31/2022  Today's healthcare provider: [name], PA-C   Chief Complaint  Patient presents with   Abdominal Pain  CC: stomach pain  Subjective      HPI  Patient presents for stomach pain / ache since Saturday.  Records it was so bad patient missed church and laid around all day.   Took Pepto Bismol and Zantac and got better.  Suspected due to tomato/cucumbers.  Reports this has never happened before.  No constipation.  BM this a.m. Pain comes and goes.  Had cucumbers on Saturday, but never bothered her before. No history of GI problems. Associated with food. 5/10 pain but eases off, constant ache. Salad dressing - french and honey mustard Does have belching, burping, passing flatus Did not have fever, nausea, vomiting Had a normal colonoscopy 2 years ago Denies having recent GI infection Has been taking AS 81 mg daily for "years" Has been taking Doxycycline since February   Medications: Outpatient Medications Prior to Visit  Medication Sig   acetaminophen (TYLENOL) 650 MG CR tablet Take 2,600 mg by mouth daily as needed for pain.   aspirin 81 MG chewable tablet Chew 81 mg by mouth daily.   Cholecalciferol (VITAMIN D) 50 MCG (2000 UT) tablet Take 2,000 Units by mouth daily.   Coenzyme Q10 (COQ-10) 100 MG capsule Take 1 capsule (100 mg total) by mouth daily.   doxycycline (MONODOX) 50 MG capsule Take 50 mg by mouth 2 (two) times daily.   estradiol (ESTRACE) 0.5 MG tablet TAKE ONE TABLET EVERY DAY   gabapentin (NEURONTIN) 600 MG tablet Take 600-900 mg by mouth See admin instructions. Take 600 mg by mouth in the morning and 900 mg  at night   hydrocortisone 2.5 % cream Apply 1 application topically 2 (two) times daily as needed (irritation).   metoprolol succinate (TOPROL-XL) 25 MG 24 hr tablet Take 1 tablet (25 mg total) by mouth daily.   Multiple Vitamin (MULTIVITAMIN) capsule Take 1 capsule by mouth daily.    Omega-3 Fatty Acids (FISH OIL) 1000 MG CAPS Take 1,000-2,000 mg by mouth See admin instructions. Take 1000 mg in the morning and 2000 mg at night   Probiotic Product (PROBIOTIC PO) Take 1 capsule by mouth daily.   triamcinolone cream (KENALOG) 0.1 % Apply 1 application topically 2 (two) times daily as needed.   vitamin C (ASCORBIC ACID) 500 MG tablet Take 500 mg by mouth daily.    losartan (COZAAR) 100 MG tablet Take 1 tablet (100 mg total) by mouth daily.   rosuvastatin (CRESTOR) 20 MG tablet Take 1 tablet (20 mg total) by mouth daily.   No facility-administered medications prior to visit.    Review of Systems  Constitutional: Negative.   Cardiovascular: Negative.   Gastrointestinal:  Positive for abdominal distention, abdominal pain and constipation. Negative for nausea and vomiting.  Genitourinary: Negative.    {Labs  Heme  Chem  Endocrine  Serology  Results Review (optional):23779}   Objective    BP 126/86 (BP Location: Right Arm, Patient Position: Sitting, Cuff Size: Normal)   Pulse 75   Temp 97.6 F (36.4 C) (Oral)   Resp 16  Wt 142 lb 11.2 oz (64.7 kg)   SpO2 96%   BMI 26.96 kg/m  {Show previous vital signs (optional):23777}  Physical Exam Vitals reviewed.  Constitutional:      General: She is not in acute distress.    Appearance: Normal appearance. She is well-developed. She is not diaphoretic.  HENT:     Head: Normocephalic and atraumatic.  Eyes:     General: No scleral icterus.    Conjunctiva/sclera: Conjunctivae normal.  Neck:     Thyroid: No thyromegaly.  Cardiovascular:     Rate and Rhythm: Normal rate and regular rhythm.     Pulses: Normal pulses.     Heart sounds:  Normal heart sounds. No murmur heard. Pulmonary:     Effort: Pulmonary effort is normal. No respiratory distress.     Breath sounds: Normal breath sounds. No wheezing, rhonchi or rales.  Abdominal:     General: Bowel sounds are normal. There is distension. There is no abdominal bruit.     Palpations: Abdomen is soft.     Tenderness: There is generalized abdominal tenderness.  Musculoskeletal:     Cervical back: Neck supple.     Right lower leg: No edema.     Left lower leg: No edema.  Lymphadenopathy:     Cervical: No cervical adenopathy.  Skin:    General: Skin is warm and dry.     Findings: No rash.  Neurological:     Mental Status: She is alert and oriented to person, place, and time. Mental status is at baseline.  Psychiatric:        Mood and Affect: Mood normal.        Behavior: Behavior normal.     No results found for any visits on 01/31/22.  Assessment & Plan     1. Pain of upper abdomen Started a trial of omeprazole Stopped taking ASA Advised to discuss her current treatment for dry eye syndrome/doxycycline with her ophthalmologist -H/pylori breath test Elevate the head of the bed 6-8 inches, avoid recumbency for 3 hours after eating, avoid food as a delayed gastric emptying, weight loss  Prevent constipation , make dietary modifications Fu I 1 week?    The patient was advised to call back or seek an in-person evaluation if the symptoms worsen or if the condition fails to improve as anticipated.  I discussed the assessment and treatment plan with the patient. The patient was provided an opportunity to ask questions and all were answered. The patient agreed with the plan and demonstrated an understanding of the instructions.  The entirety of the information documented in the History of Present Illness, Review of Systems and Physical Exam were personally obtained by me. Portions of this information were initially documented by the CMA and reviewed by me for  thoroughness and accuracy.  Portions of this note were created using dictation software and may contain typographical errors.       Total encounter time more than 40 minutes  Greater than 50% was spent in counseling and coordination of care with the patient    Elberta Leatherwood  [facility] [phone] (phone) [phone] (fax)  [place]

## 2022-02-01 DIAGNOSIS — R1010 Upper abdominal pain, unspecified: Secondary | ICD-10-CM | POA: Diagnosis not present

## 2022-02-02 LAB — H. PYLORI BREATH TEST: H pylori Breath Test: NEGATIVE

## 2022-02-04 ENCOUNTER — Telehealth: Payer: Self-pay

## 2022-02-04 NOTE — Telephone Encounter (Signed)
Pt states she feels great. Wants to know if you want her to stay off the baby aspirin? She has added fiber to her diet and it's working well for regularity.  Wanted you to know.

## 2022-02-04 NOTE — Telephone Encounter (Signed)
Copied from [place] [phone]. Topic: General - Other >> Feb 04, 2022 11:24 AM Pawlus, Monica A wrote: Reason for CRM: Pt called in to go over her latest lab results and if she is supposed to continue taking doxycycline, please advise.

## 2022-02-05 ENCOUNTER — Other Ambulatory Visit: Payer: Self-pay

## 2022-02-05 NOTE — Telephone Encounter (Signed)
Pt calling in to fu on previous mssg. Advised pt of current status  Pt requesting a call back on cell phone  Phone [phone]

## 2022-02-06 NOTE — Telephone Encounter (Signed)
Attempted to reach pt.  Phone just rings. No VM. If pt r/t's call please advise for her to call and check with eye doctor and see if she is to continue ABX.  Okay for [facility] triage to advise.

## 2022-02-06 NOTE — Telephone Encounter (Signed)
Pt called back with further questions, please advise.

## 2022-02-07 ENCOUNTER — Telehealth: Payer: Self-pay | Admitting: Adult Health

## 2022-02-07 DIAGNOSIS — G4733 Obstructive sleep apnea (adult) (pediatric): Secondary | ICD-10-CM | POA: Diagnosis not present

## 2022-02-07 NOTE — Telephone Encounter (Signed)
FYI: Spoke with pt who again wants to know if we've heard from eye doctor regarding ABX. Doxyclyline 100 mg once daily.  States she went by office yesterday to find out and was told there was till no word from doctor answering her question. Pt advised to call office again and that she needs the answer to come from the doctor that prescribed the medication.  Pt agreeable.  Advised we will call her if we here from office.

## 2022-02-07 NOTE — Telephone Encounter (Signed)
Sarah Todd called my phone wanting to know if we had her sleep study results yet.  I explained that the report hasn't been scanned into Epic yet

## 2022-02-07 NOTE — Telephone Encounter (Signed)
Pt stated that Eulis Canner was concerned about a medication she was taking for dry eye, medication Doxycycline hyclate.  Pt stated she was advised that there was no harm in it would not upset her stomach if she took it while eating or had eaten, just not to take on an empty stomach.   Please advise

## 2022-02-08 NOTE — Telephone Encounter (Signed)
Called patient to explain to her that I understand she has done her home sleep study but hte report has not been scanned into our system just yet. We will call her when we have the results of the HST. Patient verbalized understanding. Nothing further needed at this time

## 2022-02-11 ENCOUNTER — Telehealth: Payer: Self-pay | Admitting: Adult Health

## 2022-02-11 NOTE — Telephone Encounter (Signed)
ATC home # x1.  No answer.  No VM. ATC mobile # x1.  LVM to return call.  When she returns call, please schedule OV to review results/treatment plan.

## 2022-02-11 NOTE — Telephone Encounter (Signed)
-----   Message from Donne Hazel sent at 02/08/2022  2:57 PM EDT ----- Hello this patient's HST report is ready for review

## 2022-02-11 NOTE — Telephone Encounter (Signed)
Home sleep study done on Jan 30, 2022 showed his moderate obstructive sleep apnea the AHI at 15.6/hour and SPO2 low at 81%.  Please set up office visit to discuss sleep study results and treatment plan

## 2022-02-11 NOTE — Telephone Encounter (Signed)
Pt states she's doing great.  No more issue.  Eating like she wants and no problem. Requested to cancel upcoming f/u appt and this has been done.

## 2022-02-21 ENCOUNTER — Ambulatory Visit: Payer: PPO | Admitting: Adult Health

## 2022-02-21 ENCOUNTER — Encounter: Payer: Self-pay | Admitting: Adult Health

## 2022-02-21 DIAGNOSIS — G4733 Obstructive sleep apnea (adult) (pediatric): Secondary | ICD-10-CM | POA: Diagnosis not present

## 2022-02-21 NOTE — Progress Notes (Signed)
$'@Patient'D$  ID: Sarah Todd, female    DOB: November 10, 1945, 76 y.o.   MRN: [id number]  Chief Complaint  Patient presents with   Follow-up    Referring provider: Virginia Crews, MD  HPI: 75 year old female seen for sleep consult November 15, 2021 for snoring and daytime sleepiness found to have moderate obstructive sleep apnea Chronic leg pain/neuropathy- on Gabapentin, D CHF   TEST/EVENTS :  Home sleep study done on Jan 30, 2022 showed his moderate obstructive sleep apnea the AHI at 15.6/hour and SPO2 low at 81%.  02/21/2022 Follow up : OSA  Patient presents for a 78-monthfollow-up.  Patient was seen last visit for sleep consult for snoring and daytime sleepiness.  She was set up for a home sleep study done on Jan 30, 2022 that showed moderate obstructive sleep apnea with AHI at 15.6/hour and SPO2 low at 81%.  We discussed her sleep study results in detail.  Patient education on sleep apnea went over treatment options including healthy weight, oral appliance and CPAP.  Patient would like to proceed with CPAP therapy.  Patient education on CPAP care   Allergies  Allergen Reactions   Lisinopril Anaphylaxis   Shrimp [Shellfish Allergy] Anaphylaxis   Aspercreme [Trolamine Salicylate] Rash    Immunization History  Administered Date(s) Administered   Influenza,inj,Quad PF,6+ Mos 06/15/2015   Influenza-Unspecified 05/13/2017, 06/09/2018, 05/18/2019, 07/28/2020, 07/27/2021   Moderna Sars-Covid-2 Vaccination 10/30/2019, 11/27/2019, 08/11/2020   Pneumococcal Conjugate-13 11/17/2014   Pneumococcal Polysaccharide-23 08/03/2013   Tdap 10/16/2016   Zoster Recombinat (Shingrix) 09/09/2018, 03/09/2019    Past Medical History:  Diagnosis Date   Allergy    Arthritis    hands   Diastolic dysfunction    a. 08/2021 Echo: EF 55-60%, no rwma. GrII DD. Nl RV fxn. Mild MR. Ao sclerosis w/o stenosis.   Family history of adverse reaction to anesthesia    sister but not sure of what happened    GERD (gastroesophageal reflux disease)    History of cardiovascular stress test    a. 10/2021 MV: EF 88%. No ischemia/infarct. CT w/ mild Cor Ca2+ and Ao atherosclerosis.  Low risk scan.   Hyperlipidemia    Hypertension    Osteoporosis    Varicose veins    Mild    Tobacco History: Social History   Tobacco Use  Smoking Status Never  Smokeless Tobacco Never   Counseling given: Not Answered   Outpatient Medications Prior to Visit  Medication Sig Dispense Refill   acetaminophen (TYLENOL) 650 MG CR tablet Take 2,600 mg by mouth daily as needed for pain.     Cholecalciferol (VITAMIN D) 50 MCG (2000 UT) tablet Take 2,000 Units by mouth daily.     Coenzyme Q10 (COQ-10) 100 MG capsule Take 1 capsule (100 mg total) by mouth daily. 30 capsule 3   doxycycline (MONODOX) 50 MG capsule Take 50 mg by mouth 2 (two) times daily.     estradiol (ESTRACE) 0.5 MG tablet TAKE ONE TABLET EVERY DAY 90 tablet 1   gabapentin (NEURONTIN) 600 MG tablet Take 600-900 mg by mouth See admin instructions. Take 600 mg by mouth in the morning and 900 mg at night     hydrocortisone 2.5 % cream Apply 1 application topically 2 (two) times daily as needed (irritation). 28 g 2   metoprolol succinate (TOPROL-XL) 25 MG 24 hr tablet Take 1 tablet (25 mg total) by mouth daily. 90 tablet 3   Multiple Vitamin (MULTIVITAMIN) capsule Take 1 capsule by mouth daily.  Omega-3 Fatty Acids (FISH OIL) 1000 MG CAPS Take 1,000-2,000 mg by mouth See admin instructions. Take 1000 mg in the morning and 2000 mg at night     omeprazole (PRILOSEC) 20 MG capsule Take 1 capsule (20 mg total) by mouth daily. 30 capsule 3   Probiotic Product (PROBIOTIC PO) Take 1 capsule by mouth daily.     triamcinolone cream (KENALOG) 0.1 % Apply 1 application topically 2 (two) times daily as needed.     vitamin C (ASCORBIC ACID) 500 MG tablet Take 500 mg by mouth daily.      losartan (COZAAR) 100 MG tablet Take 1 tablet (100 mg total) by mouth daily. 30  tablet 6   rosuvastatin (CRESTOR) 20 MG tablet Take 1 tablet (20 mg total) by mouth daily. 90 tablet 3   No facility-administered medications prior to visit.     Review of Systems:   Constitutional:   No  weight loss, night sweats,  Fevers, chills,  +fatigue, or  lassitude.  HEENT:   No headaches,  Difficulty swallowing,  Tooth/dental problems, or  Sore throat,                No sneezing, itching, ear ache, nasal congestion, post nasal drip,   CV:  No chest pain,  Orthopnea, PND, swelling in lower extremities, anasarca, dizziness, palpitations, syncope.   GI  No heartburn, indigestion, abdominal pain, nausea, vomiting, diarrhea, change in bowel habits, loss of appetite, bloody stools.   Resp: No shortness of breath with exertion or at rest.  No excess mucus, no productive cough,  No non-productive cough,  No coughing up of blood.  No change in color of mucus.  No wheezing.  No chest wall deformity  Skin: no rash or lesions.  GU: no dysuria, change in color of urine, no urgency or frequency.  No flank pain, no hematuria   MS: + joint pain     Physical Exam  BP 128/80 (BP Location: Left Arm, Patient Position: Sitting, Cuff Size: Normal)   Pulse 85   Temp (!) 97.5 F (36.4 C) (Oral)   Ht '5\' 2"'$  (1.575 m)   Wt 141 lb 12.8 oz (64.3 kg)   SpO2 97%   BMI 25.94 kg/m   GEN: A/Ox3; pleasant , NAD, well nourished    HEENT:  [place]/AT,  , NOSE-clear, THROAT-clear, no lesions, no postnasal drip or exudate noted.  Class 2-3 MP airway   NECK:  Supple w/ fair ROM; no JVD; normal carotid impulses w/o bruits; no thyromegaly or nodules palpated; no lymphadenopathy.    RESP  Clear  P & A; w/o, wheezes/ rales/ or rhonchi. no accessory muscle use, no dullness to percussion  CARD:  RRR, no m/r/g, no peripheral edema, pulses intact, no cyanosis or clubbing.  GI:   Soft & nt; nml bowel sounds; no organomegaly or masses detected.   Musco: Warm bil, no deformities or joint swelling noted.    Neuro: alert, no focal deficits noted.    Skin: Warm, no lesions or rashes    Lab Results:     BNP No results found for: "BNP"  ProBNP No results found for: "PROBNP"  Imaging: No results found.        No data to display          No results found for: "NITRICOXIDE"      Assessment & Plan:   OSA (obstructive sleep apnea) Moderate OSA - discussed sleep study results  Reviewed treatment options  Will proceed  with CPAP , auto CPAP 5 to 15 cm H2O , mask of choice   Plan  Patient Instructions  Begin CPAP at bedtime Wear CPAP all night long for at least 6 or more hours Saline nasal rinses As needed   Saline nasal gel At bedtime   Work on healthy weight Do not drive if sleepy Healthy sleep regimen Follow-up in 3 months and as needed       [employer], NP 02/21/2022

## 2022-02-21 NOTE — Assessment & Plan Note (Signed)
Moderate OSA - discussed sleep study results  Reviewed treatment options  Will proceed with CPAP , auto CPAP 5 to 15 cm H2O , mask of choice   Plan  Patient Instructions  Begin CPAP at bedtime Wear CPAP all night long for at least 6 or more hours Saline nasal rinses As needed   Saline nasal gel At bedtime   Work on healthy weight Do not drive if sleepy Healthy sleep regimen Follow-up in 3 months and as needed

## 2022-02-21 NOTE — Addendum Note (Signed)
Addended by: Vanessa Barbara on: 02/21/2022 04:53 PM   Modules accepted: Orders

## 2022-02-21 NOTE — Patient Instructions (Addendum)
Begin CPAP at bedtime Wear CPAP all night long for at least 6 or more hours Dream wear nasal mask may work well.  Saline nasal rinses As needed   Saline nasal gel At bedtime   Work on healthy weight Do not drive if sleepy Healthy sleep regimen Follow-up in 3 months and as needed [employer])

## 2022-02-21 NOTE — Addendum Note (Signed)
Addended by: Vanessa Barbara on: 02/21/2022 02:22 PM   Modules accepted: Orders

## 2022-02-22 ENCOUNTER — Ambulatory Visit: Payer: PPO | Admitting: Physician Assistant

## 2022-03-05 NOTE — Progress Notes (Deleted)
Follow-up Outpatient Visit Date: 03/06/2022  Primary Care Provider: Virginia Crews, [address] [place] [address]  Chief Complaint: ***  HPI:  Ms. Barba is a 76 y.o. female with history of LBBB, hypertension, hyperlipidemia, GERD, varicose veins, and arthritis, who presents for follow-up of dyspnea on exertion and fatigue.  She was last seen in our office in early March by Ignacia Bayley, NP, at which time she was doing well.  Prior workup includes low-risk MPI and normal LVEF with grade II diastolic dysfunction and mild MR.  --------------------------------------------------------------------------------------------------  Past Medical History:  Diagnosis Date   Allergy    Arthritis    hands   Diastolic dysfunction    a. 08/2021 Echo: EF 55-60%, no rwma. GrII DD. Nl RV fxn. Mild MR. Ao sclerosis w/o stenosis.   Family history of adverse reaction to anesthesia    sister but not sure of what happened   GERD (gastroesophageal reflux disease)    History of cardiovascular stress test    a. 10/2021 MV: EF 88%. No ischemia/infarct. CT w/ mild Cor Ca2+ and Ao atherosclerosis.  Low risk scan.   Hyperlipidemia    Hypertension    Osteoporosis    Varicose veins    Mild   Past Surgical History:  Procedure Laterality Date   ABDOMINAL HYSTERECTOMY     ANTERIOR CERVICAL DECOMP/DISCECTOMY FUSION N/A 07/21/2019   Procedure: ANTERIOR CERVICAL DECOMPRESSION/DISCECTOMY FUSION CERVICAL THREE- CERVICAL FOUR, CERVICAL FOUR- CERVICAL FIVE, CERVICAL FIVE- CERVICAL SIX;  Surgeon: Jovita Gamma, MD;  Location: [facility];  Service: Neurosurgery;  Laterality: N/A;  ANTERIOR CERVICAL DECOMPRESSION/DISCECTOMY FUSION CERVICAL 3- CERVICAL 4, CERVICAL 4- CERVICAL 5, CERVICAL 5- CERVICAL 6   BACK SURGERY     cervical and lumbar     COLONOSCOPY N/A 01/05/2015   Procedure: COLONOSCOPY;  Surgeon: Lucilla Lame, MD;  Location: [facility];  Service: Gastroenterology;  Laterality: N/A;    COLONOSCOPY WITH PROPOFOL N/A 01/25/2020   Procedure: COLONOSCOPY WITH PROPOFOL;  Surgeon: Lucilla Lame, MD;  Location: [facility] ENDOSCOPY;  Service: Endoscopy;  Laterality: N/A;   DILATION AND CURETTAGE OF UTERUS     FRACTURE SURGERY     clavicle right   TOOTH EXTRACTION     TOTAL HIP ARTHROPLASTY Right 12/06/2020   Procedure: TOTAL HIP ARTHROPLASTY ANTERIOR APPROACH;  Surgeon: Lovell Sheehan, MD;  Location: [facility];  Service: Orthopedics;  Laterality: Right;   TUBAL LIGATION      No outpatient medications have been marked as taking for the 03/06/22 encounter (Appointment) with Keilana Morlock, Harrell Gave, MD.    Allergies: Lisinopril, Shrimp [shellfish allergy], and Aspercreme [trolamine salicylate]  Social History   Tobacco Use   Smoking status: Never   Smokeless tobacco: Never  Vaping Use   Vaping Use: Never used  Substance Use Topics   Alcohol use: No   Drug use: No    Family History  Problem Relation Age of Onset   Cancer Mother        colon cancer   Alzheimer's disease Father    Heart disease Father    Hypertension Father    Cancer Sister        uterine   Breast cancer Neg Hx     Review of Systems: A 12-system review of systems was performed and was negative except as noted in the HPI.  --------------------------------------------------------------------------------------------------  Physical Exam: There were no vitals taken for this visit.  General:  NAD. Neck: No JVD or HJR. Lungs: Clear to auscultation bilaterally  without wheezes or crackles. Heart: Regular rate and rhythm without murmurs, rubs, or gallops. Abdomen: Soft, nontender, nondistended. Extremities: No lower extremity edema.  EKG:  ***  Lab Results  Component Value Date   WBC 8.6 06/26/2021   HGB 13.4 06/26/2021   HCT 38.8 06/26/2021   MCV 93 06/26/2021   PLT 320 06/26/2021    Lab Results  Component Value Date   NA 135 11/21/2021   K 4.2 11/21/2021   CL 101 11/21/2021   CO2 28 11/21/2021    BUN 14 11/21/2021   CREATININE 0.67 11/21/2021   GLUCOSE 100 (H) 11/21/2021   ALT 21 11/21/2021    Lab Results  Component Value Date   CHOL 125 11/21/2021   HDL 66 11/21/2021   LDLCALC 41 11/21/2021   TRIG 88 11/21/2021   CHOLHDL 1.9 11/21/2021    --------------------------------------------------------------------------------------------------  ASSESSMENT AND PLAN: Nelva Bush, MD 03/05/2022 4:29 PM

## 2022-03-06 ENCOUNTER — Encounter: Payer: Self-pay | Admitting: Internal Medicine

## 2022-03-06 ENCOUNTER — Ambulatory Visit: Payer: PPO | Admitting: Internal Medicine

## 2022-03-06 VITALS — BP 116/70 | HR 75 | Ht 62.0 in | Wt 142.0 lb

## 2022-03-06 DIAGNOSIS — I2510 Atherosclerotic heart disease of native coronary artery without angina pectoris: Secondary | ICD-10-CM | POA: Diagnosis not present

## 2022-03-06 DIAGNOSIS — I2584 Coronary atherosclerosis due to calcified coronary lesion: Secondary | ICD-10-CM

## 2022-03-06 DIAGNOSIS — I5189 Other ill-defined heart diseases: Secondary | ICD-10-CM | POA: Diagnosis not present

## 2022-03-06 DIAGNOSIS — E782 Mixed hyperlipidemia: Secondary | ICD-10-CM

## 2022-03-06 DIAGNOSIS — I10 Essential (primary) hypertension: Secondary | ICD-10-CM

## 2022-03-06 DIAGNOSIS — R5383 Other fatigue: Secondary | ICD-10-CM

## 2022-03-06 DIAGNOSIS — R0609 Other forms of dyspnea: Secondary | ICD-10-CM | POA: Diagnosis not present

## 2022-03-06 DIAGNOSIS — I700 Atherosclerosis of aorta: Secondary | ICD-10-CM

## 2022-03-06 MED ORDER — ASPIRIN 81 MG PO TBEC: 81.0000 mg | DELAYED_RELEASE_TABLET | Freq: Every day | ORAL | Status: AC

## 2022-03-06 NOTE — Patient Instructions (Signed)
Medication Instructions:   Your physician has recommended you make the following change in your medication:   RESTART Aspirin 81 mg     - If you have recurrent stomach upset after restarting Aspirin, please let us know.   *If you need a refill on your cardiac medications before your next appointment, please call your pharmacy*   Lab Work:  None ordered  Testing/Procedures:  None ordered   Follow-Up: At [facility], you and your health needs are our priority.  As part of our continuing mission to provide you with exceptional heart care, we have created designated Provider Care Teams.  These Care Teams include your primary Cardiologist (physician) and Advanced Practice Providers (APPs -  Physician Assistants and Nurse Practitioners) who all work together to provide you with the care you need, when you need it.  We recommend signing up for the patient portal called "MyChart".  Sign up information is provided on this After Visit Summary.  MyChart is used to connect with patients for Virtual Visits (Telemedicine).  Patients are able to view lab/test results, encounter notes, upcoming appointments, etc.  Non-urgent messages can be sent to your provider as well.   To learn more about what you can do with MyChart, go to [email or web address].    Your next appointment:   1 year(s)  The format for your next appointment:   In Person  Provider:   You may see Nelva Bush, MD or one of the following Advanced Practice Providers on your designated Care Team:   Murray Hodgkins, NP Christell Faith, PA-C Cadence Kathlen Mody, PA-C{   Important Information About Sugar

## 2022-03-06 NOTE — Progress Notes (Unsigned)
Follow-up Outpatient Visit Date: 03/06/2022  Primary Care Provider: Virginia Crews, [address] [place] [address]  Chief Complaint: Follow-up dyspnea on exertion and fatigue  HPI:  Sarah Todd is a 76 y.o. female with history of intermittent LBBB, hypertension, hyperlipidemia, GERD, varicose veins, and arthritis, who presents for follow-up of dyspnea on exertion and fatigue.  She was last seen in our office in early March by Ignacia Bayley, NP, at which time she was doing well.  Prior workup includes low-risk MPI and normal LVEF with grade II diastolic dysfunction and mild MR.  Today, Sarah Todd reports that she feels fairly well.  She has not had any chest pain, shortness of breath, palpitations, lightheadedness, or edema.  She recently stopped taking aspirin [place] advice of her PCP due to some GI discomfort.  She notes that she was also constipated at the time and began taking a fiber supplement.  She continues to get fatigued at times, sometimes needing to lie down and rest after doing chores for an extended period.  --------------------------------------------------------------------------------------------------  Past Medical History:  Diagnosis Date   Allergy    Arthritis    hands   Diastolic dysfunction    a. 08/2021 Echo: EF 55-60%, no rwma. GrII DD. Nl RV fxn. Mild MR. Ao sclerosis w/o stenosis.   Family history of adverse reaction to anesthesia    sister but not sure of what happened   GERD (gastroesophageal reflux disease)    History of cardiovascular stress test    a. 10/2021 MV: EF 88%. No ischemia/infarct. CT w/ mild Cor Ca2+ and Ao atherosclerosis.  Low risk scan.   Hyperlipidemia    Hypertension    Osteoporosis    Varicose veins    Mild   Past Surgical History:  Procedure Laterality Date   ABDOMINAL HYSTERECTOMY     ANTERIOR CERVICAL DECOMP/DISCECTOMY FUSION N/A 07/21/2019   Procedure: ANTERIOR CERVICAL DECOMPRESSION/DISCECTOMY  FUSION CERVICAL THREE- CERVICAL FOUR, CERVICAL FOUR- CERVICAL FIVE, CERVICAL FIVE- CERVICAL SIX;  Surgeon: Jovita Gamma, MD;  Location: [facility];  Service: Neurosurgery;  Laterality: N/A;  ANTERIOR CERVICAL DECOMPRESSION/DISCECTOMY FUSION CERVICAL 3- CERVICAL 4, CERVICAL 4- CERVICAL 5, CERVICAL 5- CERVICAL 6   BACK SURGERY     cervical and lumbar     COLONOSCOPY N/A 01/05/2015   Procedure: COLONOSCOPY;  Surgeon: Lucilla Lame, MD;  Location: [facility];  Service: Gastroenterology;  Laterality: N/A;   COLONOSCOPY WITH PROPOFOL N/A 01/25/2020   Procedure: COLONOSCOPY WITH PROPOFOL;  Surgeon: Lucilla Lame, MD;  Location[facility] ENDOSCOPY;  Service: Endoscopy;  Laterality: N/A;   DILATION AND CURETTAGE OF UTERUS     FRACTURE SURGERY     clavicle right   TOOTH EXTRACTION     TOTAL HIP ARTHROPLASTY Right 12/06/2020   Procedure: TOTAL HIP ARTHROPLASTY ANTERIOR APPROACH;  Surgeon: Lovell Sheehan, MD;  Location: [facility];  Service: Orthopedics;  Laterality: Right;   TUBAL LIGATION      Current Meds  Medication Sig   acetaminophen (TYLENOL) 650 MG CR tablet Take 2,600 mg by mouth daily as needed for pain.   aspirin EC 81 MG tablet Take 1 tablet (81 mg total) by mouth daily. Swallow whole.   Cholecalciferol (VITAMIN D) 50 MCG (2000 UT) tablet Take 2,000 Units by mouth daily.   Coenzyme Q10 (COQ-10) 100 MG capsule Take 1 capsule (100 mg total) by mouth daily.   doxycycline (MONODOX) 50 MG capsule Take 50 mg by mouth 2 (two) times daily.  estradiol (ESTRACE) 0.5 MG tablet TAKE ONE TABLET EVERY DAY   gabapentin (NEURONTIN) 600 MG tablet Take 600-900 mg by mouth See admin instructions. Take 600 mg by mouth in the morning and 900 mg at night   hydrocortisone 2.5 % cream Apply 1 application topically 2 (two) times daily as needed (irritation).   losartan (COZAAR) 100 MG tablet Take 1 tablet (100 mg total) by mouth daily.   metoprolol succinate (TOPROL-XL) 25 MG 24 hr tablet Take 1 tablet (25 mg total)  by mouth daily.   Multiple Vitamin (MULTIVITAMIN) capsule Take 1 capsule by mouth daily.    Omega-3 Fatty Acids (FISH OIL) 1000 MG CAPS Take 1,000-2,000 mg by mouth See admin instructions. Take 1000 mg in the morning and 2000 mg at night   omeprazole (PRILOSEC) 20 MG capsule Take 1 capsule (20 mg total) by mouth daily.   Probiotic Product (PROBIOTIC PO) Take 1 capsule by mouth daily.   rosuvastatin (CRESTOR) 20 MG tablet Take 1 tablet (20 mg total) by mouth daily.   triamcinolone cream (KENALOG) 0.1 % Apply 1 application topically 2 (two) times daily as needed.   vitamin C (ASCORBIC ACID) 500 MG tablet Take 500 mg by mouth daily.     Allergies: Lisinopril, Shrimp [shellfish allergy], and Aspercreme [trolamine salicylate]  Social History   Tobacco Use   Smoking status: Never   Smokeless tobacco: Never  Vaping Use   Vaping Use: Never used  Substance Use Topics   Alcohol use: No   Drug use: No    Family History  Problem Relation Age of Onset   Cancer Mother        colon cancer   Alzheimer's disease Father    Heart disease Father    Hypertension Father    Cancer Sister        uterine   Breast cancer Neg Hx     Review of Systems: A 12-system review of systems was performed and was negative except as noted in the HPI.  --------------------------------------------------------------------------------------------------  Physical Exam: BP 116/70 (BP Location: Left Arm, Patient Position: Sitting, Cuff Size: Large)   Pulse 75   Ht '5\' 2"'$  (1.575 m)   Wt 142 lb (64.4 kg)   SpO2 97%   BMI 25.97 kg/m   General:  NAD. Neck: No JVD or HJR. Lungs: Clear to auscultation bilaterally without wheezes or crackles. Heart: Regular rate and rhythm without murmurs, rubs, or gallops. Abdomen: Soft, nontender, nondistended. Extremities: No lower extremity edema.  EKG: Normal sinus rhythm with borderline LVH, poor R wave progression, and inferior Q waves.  Compared with prior tracing from  07/18/2021, heart rate has decreased and LBBB resolved.  Lab Results  Component Value Date   WBC 8.6 06/26/2021   HGB 13.4 06/26/2021   HCT 38.8 06/26/2021   MCV 93 06/26/2021   PLT 320 06/26/2021    Lab Results  Component Value Date   NA 135 11/21/2021   K 4.2 11/21/2021   CL 101 11/21/2021   CO2 28 11/21/2021   BUN 14 11/21/2021   CREATININE 0.67 11/21/2021   GLUCOSE 100 (H) 11/21/2021   ALT 21 11/21/2021    Lab Results  Component Value Date   CHOL 125 11/21/2021   HDL 66 11/21/2021   LDLCALC 41 11/21/2021   TRIG 88 11/21/2021   CHOLHDL 1.9 11/21/2021    --------------------------------------------------------------------------------------------------  ASSESSMENT AND PLAN: Dyspnea on exertion, fatigue, and diastolic dysfunction: Symptoms have improved though Sarah Todd still has some fatigue after doing  strenuous activities around the house.  Cardiac work-up has been reassuring with preserved LVEF and no evidence of myocardial ischemia.  Some degree of diastolic dysfunction could be contributing to her symptoms.  Sarah Todd appears euvolemic today.  We have agreed to continue her current medications.  Hypertension: Blood pressure well controlled today.  No medication changes at Sarah time.  Hyperlipidemia, coronary artery calcification, and aortic atherosclerosis: Lipids very well controlled.  Continue rosuvastatin.  Restart aspirin 81 mg daily.  If Sarah Todd has recurrent abdominal pain, aspirin can be stopped.  Follow-up: Return to clinic in 1 year.  Nelva Bush, MD 03/07/2022 7:33 AM

## 2022-03-07 ENCOUNTER — Encounter: Payer: Self-pay | Admitting: Internal Medicine

## 2022-03-07 DIAGNOSIS — I700 Atherosclerosis of aorta: Secondary | ICD-10-CM | POA: Insufficient documentation

## 2022-03-07 DIAGNOSIS — I5189 Other ill-defined heart diseases: Secondary | ICD-10-CM | POA: Insufficient documentation

## 2022-03-07 DIAGNOSIS — I2510 Atherosclerotic heart disease of native coronary artery without angina pectoris: Secondary | ICD-10-CM | POA: Insufficient documentation

## 2022-03-11 ENCOUNTER — Ambulatory Visit: Payer: PPO | Admitting: Dermatology

## 2022-04-01 ENCOUNTER — Ambulatory Visit
Admission: RE | Admit: 2022-04-01 | Discharge: 2022-04-01 | Disposition: A | Discharge status: Home / Self Care | Payer: PPO | Source: Ambulatory Visit | Attending: Family Medicine | Admitting: Family Medicine

## 2022-04-01 DIAGNOSIS — Z1231 Encounter for screening mammogram for malignant neoplasm of breast: Secondary | ICD-10-CM | POA: Insufficient documentation

## 2022-04-04 DIAGNOSIS — G4733 Obstructive sleep apnea (adult) (pediatric): Secondary | ICD-10-CM | POA: Diagnosis not present

## 2022-04-04 DIAGNOSIS — R531 Weakness: Secondary | ICD-10-CM | POA: Diagnosis not present

## 2022-04-04 NOTE — Progress Notes (Signed)
Hi Marty  Normal mammogram; repeat in 1 year.  Please let us know if you have any questions.  Thank you,  Tally Joe, FNP

## 2022-04-08 DIAGNOSIS — L718 Other rosacea: Secondary | ICD-10-CM | POA: Diagnosis not present

## 2022-04-09 ENCOUNTER — Telehealth: Payer: Self-pay | Admitting: Adult Health

## 2022-04-09 DIAGNOSIS — G4733 Obstructive sleep apnea (adult) (pediatric): Secondary | ICD-10-CM

## 2022-04-09 NOTE — Telephone Encounter (Signed)
Okay let us know if she changes her mind or wants to keep follow up to discuss other options. Such as INSPIRE or Oral appliance

## 2022-04-09 NOTE — Telephone Encounter (Signed)
Patient called into the office today and explained that she used the Cpap Machine 3 days and couldn't stand it and returned to [facility].  She states that she needs [place] to send in an order to discontinue the CPAP.  She stated that if down the road she thought she still needed the Cpap she would call us back

## 2022-04-10 NOTE — Telephone Encounter (Signed)
Order placed to discontinue the cpap through [facility]. Told patient to let us know in the further if she would like to try cpap again. I advised her to keep follow up to talk to Tammy about other options. Nothing further needed

## 2022-04-18 ENCOUNTER — Other Ambulatory Visit: Payer: Self-pay | Admitting: Nurse Practitioner

## 2022-04-30 ENCOUNTER — Ambulatory Visit: Payer: Self-pay | Admitting: *Deleted

## 2022-04-30 NOTE — Patient Outreach (Signed)
  Care Coordination   Initial Visit Note   04/30/2022 Name: Sarah Todd MRN: [id number] DOB: 02/25/1946  Sarah Todd is a 76 y.o. year old female who sees Bacigalupo, Dionne Bucy, MD for primary care. I spoke with  Wonda Amis by phone today.  What matters to the patients health and wellness today?  Case Management Services offered, patient verbalized having no nursing or community resource needs at this time    Goals Addressed             This Visit's Progress    care coordination activities       Care Coordination Interventions: Case Management Program offered and discussed SDOH screening completed Confirmed patient's next scheduled visit with her PCP 06/06/22 Annual Wellness Visit completed on 10/24/21 Patient declined having any nursing or community resource needs at this time but will discuss program with her provider on 06/06/22         SDOH assessments and interventions completed:  Yes  SDOH Interventions Today    Flowsheet Row Most Recent Value  SDOH Interventions   Social Connections Interventions Intervention Not Indicated  Transportation Interventions Intervention Not Indicated        Care Coordination Interventions Activated:  Yes  Care Coordination Interventions:  Yes, provided   Follow up plan: No further intervention required.   Encounter Outcome:  Pt. Visit Completed

## 2022-04-30 NOTE — Patient Instructions (Signed)
Visit Information  Thank you for taking time to visit with me today. Please don't hesitate to contact me if I can be of assistance to you.   Following are the goals we discussed today:   Goals Addressed             This Visit's Progress    care coordination activities       Care Coordination Interventions: Case Management Program offered and discussed SDOH screening completed Confirmed patient's next scheduled visit with her PCP 06/06/22 Annual Wellness Visit completed on 10/24/21 Patient declined having any nursing or community resource needs at this time but will discuss program with her provider on 06/06/22          Please call the care guide team at [phone] if you need to schedule an appointment with the case management team.  If you are experiencing a Mental Health or [place] or need someone to talk to, please call the Suicide and Crisis Lifeline: 988   Patient verbalizes understanding of instructions and care plan provided today and agrees to view in [place]. Active MyChart status and patient understanding of how to access instructions and care plan via MyChart confirmed with patient.     Next PCP appointment scheduled for: 06/06/22 Annual Wellness Visit) completed on 10/24/21  Elliot Gurney, Gross Worker  [facility] Care Management [phone]

## 2022-05-05 DIAGNOSIS — G4733 Obstructive sleep apnea (adult) (pediatric): Secondary | ICD-10-CM | POA: Diagnosis not present

## 2022-05-05 DIAGNOSIS — R531 Weakness: Secondary | ICD-10-CM | POA: Diagnosis not present

## 2022-05-20 ENCOUNTER — Ambulatory Visit: Payer: PPO | Admitting: Adult Health

## 2022-05-20 ENCOUNTER — Ambulatory Visit: Payer: PPO | Admitting: Family Medicine

## 2022-06-05 NOTE — Progress Notes (Unsigned)
I,Kamira Mellette S Auria Mckinlay,acting as a Education administrator for Lavon Paganini, MD.,have documented all relevant documentation on the behalf of Lavon Paganini, MD,as directed by  Lavon Paganini, MD while in the presence of Lavon Paganini, MD.     Established patient visit   Patient: Sarah Todd   DOB: 08-23-1946   76 y.o. Female  MRN: [id number] Visit Date: 06/06/2022  Today's healthcare provider: Lavon Paganini, MD   No chief complaint on file.  Subjective    HPI  Hypertension, follow-up  BP Readings from Last 3 Encounters:  03/06/22 116/70  02/21/22 128/80  01/31/22 126/86   Wt Readings from Last 3 Encounters:  03/06/22 142 lb (64.4 kg)  02/21/22 141 lb 12.8 oz (64.3 kg)  01/31/22 142 lb 11.2 oz (64.7 kg)     She was last seen for hypertension 6 months ago.  BP at that visit was ***. Management since that visit includes no changes.  She reports {excellent/good/fair/poor:19665} compliance with treatment. She {is/is not:9024} having side effects. {document side effects if present:1} She is following a {diet:21022986} diet. She {is/is not:9024} exercising. She {does/does not:200015} smoke.  Use of agents associated with hypertension: {bp agents assoc with hypertension:511::"none"}.   Outside blood pressures are {***enter patient reported home BP readings, or 'not being checked':1}. Symptoms: {Yes/No:20286} chest pain {Yes/No:20286} chest pressure  {Yes/No:20286} palpitations {Yes/No:20286} syncope  {Yes/No:20286} dyspnea {Yes/No:20286} orthopnea  {Yes/No:20286} paroxysmal nocturnal dyspnea {Yes/No:20286} lower extremity edema   Pertinent labs Lab Results  Component Value Date   CHOL 125 11/21/2021   HDL 66 11/21/2021   LDLCALC 41 11/21/2021   TRIG 88 11/21/2021   CHOLHDL 1.9 11/21/2021   Lab Results  Component Value Date   NA 135 11/21/2021   K 4.2 11/21/2021   CREATININE 0.67 11/21/2021   GFRNONAA >60 11/21/2021   GLUCOSE 100 (H) 11/21/2021   TSH 4.480  06/26/2021     The ASCVD Risk score (Arnett DK, et al., 2019) failed to calculate for the following reasons:   The valid total cholesterol range is 130 to 320 mg/dL  --------------------------------------------------------------------------------------------------- Lipid/Cholesterol, Follow-up  Last lipid panel Other pertinent labs  Lab Results  Component Value Date   CHOL 125 11/21/2021   HDL 66 11/21/2021   LDLCALC 41 11/21/2021   TRIG 88 11/21/2021   CHOLHDL 1.9 11/21/2021   Lab Results  Component Value Date   ALT 21 11/21/2021   AST 25 11/21/2021   PLT 320 06/26/2021   TSH 4.480 06/26/2021     She was last seen for this 6 months ago.  Management since that visit includes no changes.  She reports {excellent/good/fair/poor:19665} compliance with treatment. She {is/is not:9024} having side effects. {document side effects if present:1}   The ASCVD Risk score (Arnett DK, et al., 2019) failed to calculate for the following reasons:   The valid total cholesterol range is 130 to 320 mg/dL  ---------------------------------------------------------------------------------------------------   Medications: Outpatient Medications Prior to Visit  Medication Sig   acetaminophen (TYLENOL) 650 MG CR tablet Take 2,600 mg by mouth daily as needed for pain.   aspirin EC 81 MG tablet Take 1 tablet (81 mg total) by mouth daily. Swallow whole.   Cholecalciferol (VITAMIN D) 50 MCG (2000 UT) tablet Take 2,000 Units by mouth daily.   Coenzyme Q10 (COQ-10) 100 MG capsule Take 1 capsule (100 mg total) by mouth daily.   doxycycline (MONODOX) 50 MG capsule Take 50 mg by mouth 2 (two) times daily.   estradiol (ESTRACE) 0.5 MG tablet TAKE ONE  TABLET EVERY DAY   gabapentin (NEURONTIN) 600 MG tablet Take 600-900 mg by mouth See admin instructions. Take 600 mg by mouth in the morning and 900 mg at night   hydrocortisone 2.5 % cream Apply 1 application topically 2 (two) times daily as needed  (irritation).   losartan (COZAAR) 100 MG tablet TAKE 1 TABLET BY MOUTH DAILY   metoprolol succinate (TOPROL-XL) 25 MG 24 hr tablet Take 1 tablet (25 mg total) by mouth daily.   Multiple Vitamin (MULTIVITAMIN) capsule Take 1 capsule by mouth daily.    Omega-3 Fatty Acids (FISH OIL) 1000 MG CAPS Take 1,000-2,000 mg by mouth See admin instructions. Take 1000 mg in the morning and 2000 mg at night   omeprazole (PRILOSEC) 20 MG capsule Take 1 capsule (20 mg total) by mouth daily.   Probiotic Product (PROBIOTIC PO) Take 1 capsule by mouth daily.   rosuvastatin (CRESTOR) 20 MG tablet Take 1 tablet (20 mg total) by mouth daily.   triamcinolone cream (KENALOG) 0.1 % Apply 1 application topically 2 (two) times daily as needed.   vitamin C (ASCORBIC ACID) 500 MG tablet Take 500 mg by mouth daily.    No facility-administered medications prior to visit.    Review of Systems  {Labs  Heme  Chem  Endocrine  Serology  Results Review (optional):23779}   Objective    There were no vitals taken for this visit. {Show previous vital signs (optional):23777}  Physical Exam  ***  No results found for any visits on 06/06/22.  Assessment & Plan     ***  No follow-ups on file.      {provider attestation***:1}   Lavon Paganini, MD  [facility] [phone] (phone) [phone] (fax)  [place]

## 2022-06-06 ENCOUNTER — Encounter: Payer: Self-pay | Admitting: Family Medicine

## 2022-06-06 ENCOUNTER — Ambulatory Visit (INDEPENDENT_AMBULATORY_CARE_PROVIDER_SITE_OTHER): Payer: PPO | Admitting: Family Medicine

## 2022-06-06 VITALS — BP 125/76 | HR 62 | Temp 97.9°F | Resp 16 | Wt 143.2 lb

## 2022-06-06 DIAGNOSIS — Z23 Encounter for immunization: Secondary | ICD-10-CM

## 2022-06-06 DIAGNOSIS — G4733 Obstructive sleep apnea (adult) (pediatric): Secondary | ICD-10-CM | POA: Diagnosis not present

## 2022-06-06 DIAGNOSIS — I700 Atherosclerosis of aorta: Secondary | ICD-10-CM

## 2022-06-06 DIAGNOSIS — E782 Mixed hyperlipidemia: Secondary | ICD-10-CM | POA: Diagnosis not present

## 2022-06-06 DIAGNOSIS — I10 Essential (primary) hypertension: Secondary | ICD-10-CM | POA: Diagnosis not present

## 2022-06-06 MED ORDER — HYDROCORTISONE 2.5 % EX CREA: 1.0000 | TOPICAL_CREAM | Freq: Two times a day (BID) | CUTANEOUS | 2 refills | Status: DC | PRN

## 2022-06-06 NOTE — Assessment & Plan Note (Addendum)
Was diagnosed with moderate OSA by Pulm Was unable to tolerate CPAP with full face mask - she states they would not give her a nasal pillows mask at [place] Denies any symptoms other than chronic nocturia x2-3 May consider in lab sleep study in the future

## 2022-06-06 NOTE — Assessment & Plan Note (Signed)
Previously well controlled Continue statin Repeat FLP and CMP annually 

## 2022-06-06 NOTE — Assessment & Plan Note (Signed)
Continue statin. 

## 2022-06-06 NOTE — Assessment & Plan Note (Signed)
Well controlled Continue current medications Recheck metabolic panel F/u in 6 months  

## 2022-06-07 LAB — BASIC METABOLIC PANEL
BUN/Creatinine Ratio: 12 (ref 12–28)
BUN: 8 mg/dL (ref 8–27)
CO2: 23 mmol/L (ref 20–29)
Calcium: 9.7 mg/dL (ref 8.7–10.3)
Chloride: 97 mmol/L (ref 96–106)
Creatinine, Ser: 0.67 mg/dL (ref 0.57–1.00)
Glucose: 97 mg/dL (ref 70–99)
Potassium: 4.8 mmol/L (ref 3.5–5.2)
Sodium: 135 mmol/L (ref 134–144)
eGFR: 89 mL/min/{1.73_m2} (ref >59)

## 2022-07-10 ENCOUNTER — Other Ambulatory Visit: Payer: Self-pay | Admitting: Nurse Practitioner

## 2022-07-16 ENCOUNTER — Other Ambulatory Visit: Payer: Self-pay | Admitting: Nurse Practitioner

## 2022-07-18 DIAGNOSIS — M961 Postlaminectomy syndrome, not elsewhere classified: Secondary | ICD-10-CM | POA: Diagnosis not present

## 2022-07-18 DIAGNOSIS — Z6826 Body mass index (BMI) 26.0-26.9, adult: Secondary | ICD-10-CM | POA: Diagnosis not present

## 2022-07-18 DIAGNOSIS — M5416 Radiculopathy, lumbar region: Secondary | ICD-10-CM | POA: Diagnosis not present

## 2022-08-20 ENCOUNTER — Ambulatory Visit: Payer: Self-pay

## 2022-08-20 NOTE — Telephone Encounter (Signed)
  Chief Complaint: Cough, sore throat, Sinus congestion Symptoms: above Frequency: 5 days Pertinent Negatives: Patient denies fever Disposition: '[]'$ ED /'[]'$ Urgent Care (no appt availability in office) / '[]'$ Appointment(In office/virtual)/ '[]'$  [facility]/ '[x]'$ Home Care/ '[]'$ Refused Recommended Disposition /'[]'$ [facility]/ '[]'$  Follow-up with PCP Additional Notes: Pt appears to have mild cold s/s. No fever, some cough, no difficulty breathing. Pt will monitor s/s and use home care advice.     Summary: Congestion and cough   Patient states that she has had congestion, cough, and sore throat for 5+ days. Patient declined appointment.       Reason for Disposition  Cough  Answer Assessment - Initial Assessment Questions 1. ONSET: "When did the cough begin?"      5 days 2. SEVERITY: "How bad is the cough today?"      moderate 3. SPUTUM: "Describe the color of your sputum" (none, dry cough; clear, white, yellow, green)     unknown 4. HEMOPTYSIS: "Are you coughing up any blood?" If so ask: "How much?" (flecks, streaks, tablespoons, etc.)      5. DIFFICULTY BREATHING: "Are you having difficulty breathing?" If Yes, ask: "How bad is it?" (e.g., mild, moderate, severe)    - MILD: No SOB at rest, mild SOB with walking, speaks normally in sentences, can lie down, no retractions, pulse < 100.    - MODERATE: SOB at rest, SOB with minimal exertion and prefers to sit, cannot lie down flat, speaks in phrases, mild retractions, audible wheezing, pulse 100-120.    - SEVERE: Very SOB at rest, speaks in single words, struggling to breathe, sitting hunched forward, retractions, pulse > 120      mild 6. FEVER: "Do you have a fever?" If Yes, ask: "What is your temperature, how was it measured, and when did it start?"     no 7. CARDIAC HISTORY: "Do you have any history of heart disease?" (e.g., heart attack, congestive heart failure)      no 8. LUNG HISTORY: "Do you have any history of lung  disease?"  (e.g., pulmonary embolus, asthma, emphysema)     no 9. PE RISK FACTORS: "Do you have a history of blood clots?" (or: recent major surgery, recent prolonged travel, bedridden)      10. OTHER SYMPTOMS: "Do you have any other symptoms?" (e.g., runny nose, wheezing, chest pain)       Sore throat, Congestion 11. PREGNANCY: "Is there any chance you are pregnant?" "When was your last menstrual period?"        12. TRAVEL: "Have you traveled out of the country in the last month?" (e.g., travel history, exposures)  Protocols used: Cough - Acute Productive-A-AH

## 2022-08-31 ENCOUNTER — Other Ambulatory Visit: Payer: Self-pay | Admitting: Family Medicine

## 2022-09-01 NOTE — Telephone Encounter (Signed)
Requested medication (s) are due for refill today: yes  Requested medication (s) are on the active medication list: yes  Last refill:  01/31/22 #90/1  Future visit scheduled: yes  Notes to clinic:  Mammo not update     Requested Prescriptions  Pending Prescriptions Disp Refills   estradiol (ESTRACE) 0.5 MG tablet [Pharmacy Med Name: ESTRADIOL 0.5 MG TAB] 90 tablet 1    Sig: TAKE ONE TABLET EVERY DAY     OB/GYN:  Estrogens Failed - 08/31/2022 11:37 AM      Failed - Mammogram is up-to-date per Health Maintenance      Passed - Last BP in normal range    BP Readings from Last 1 Encounters:  06/06/22 125/76         Passed - Valid encounter within last 12 months    Recent Outpatient Visits           2 months ago Essential hypertension   [facility] [place], Dionne Bucy, MD   7 months ago Pain of upper abdomen   [facility] [place], [place], PA-C   9 months ago Encounter for annual physical exam   [facility] [place], Dionne Bucy, MD   1 year ago Essential hypertension   [facility] [place], Dionne Bucy, MD   1 year ago Chronic fatigue   [facility] [place], Dionne Bucy, MD

## 2022-10-04 DIAGNOSIS — M75101 Unspecified rotator cuff tear or rupture of right shoulder, not specified as traumatic: Secondary | ICD-10-CM | POA: Diagnosis not present

## 2022-10-08 ENCOUNTER — Other Ambulatory Visit: Payer: Self-pay | Admitting: Nurse Practitioner

## 2022-10-11 ENCOUNTER — Other Ambulatory Visit: Payer: Self-pay | Admitting: Nurse Practitioner

## 2022-10-17 DIAGNOSIS — H2513 Age-related nuclear cataract, bilateral: Secondary | ICD-10-CM | POA: Diagnosis not present

## 2022-10-17 DIAGNOSIS — M3501 Sicca syndrome with keratoconjunctivitis: Secondary | ICD-10-CM | POA: Diagnosis not present

## 2022-10-28 ENCOUNTER — Ambulatory Visit (INDEPENDENT_AMBULATORY_CARE_PROVIDER_SITE_OTHER): Payer: PPO

## 2022-10-28 VITALS — Ht 62.0 in | Wt 143.0 lb

## 2022-10-28 DIAGNOSIS — Z13820 Encounter for screening for osteoporosis: Secondary | ICD-10-CM | POA: Diagnosis not present

## 2022-10-28 DIAGNOSIS — Z0000 Encounter for general adult medical examination without abnormal findings: Secondary | ICD-10-CM | POA: Diagnosis not present

## 2022-10-28 DIAGNOSIS — Z1231 Encounter for screening mammogram for malignant neoplasm of breast: Secondary | ICD-10-CM | POA: Diagnosis not present

## 2022-10-28 NOTE — Patient Instructions (Signed)
Sarah Todd , Thank you for taking time to come for your Medicare Wellness Visit. I appreciate your ongoing commitment to your health goals. Please review the following plan we discussed and let me know if I can assist you in the future.   These are the goals we discussed:  Goals      care coordination activities     Care Coordination Interventions: Case Management Program offered and discussed SDOH screening completed Confirmed patient's next scheduled visit with her PCP 06/06/22 Annual Wellness Visit completed on 10/24/21 Patient declined having any nursing or community resource needs at this time but will discuss program with her provider on 06/06/22      DIET - EAT MORE FRUITS AND VEGETABLES     DIET - [place] increasing water intake to 6 glasses a day.      Exercise 3x per week (30 min per time)     Recommend to exercise for 3 days a week for at least 30 minutes at a time.         This is a list of the screening recommended for you and due dates:  Health Maintenance  Topic Date Due   COVID-19 Vaccine (6 - 2023-24 season) 05/03/2022   Medicare Annual Wellness Visit  10/29/2023   Colon Cancer Screening  01/24/2025   DTaP/Tdap/Td vaccine (2 - Td or Tdap) 10/16/2026   Pneumonia Vaccine  Completed   Flu Shot  Completed   DEXA scan (bone density measurement)  Completed   Hepatitis C Screening: USPSTF Recommendation to screen - Ages 13-79 yo.  Completed   Zoster (Shingles) Vaccine  Completed   HPV Vaccine  Aged Out    Advanced directives: yes  Conditions/risks identified: none  Next appointment: Follow up in one year for your annual wellness visit 11/03/2023 @ 2pm telephone   Preventive Care 65 Years and Older, Female Preventive care refers to lifestyle choices and visits with your health care provider that can promote health and wellness. What does preventive care include? A yearly physical exam. This is also called an annual well  check. Dental exams once or twice a year. Routine eye exams. Ask your health care provider how often you should have your eyes checked. Personal lifestyle choices, including: Daily care of your teeth and gums. Regular physical activity. Eating a healthy diet. Avoiding tobacco and drug use. Limiting alcohol use. Practicing safe sex. Taking low-dose aspirin every day. Taking vitamin and mineral supplements as recommended by your health care provider. What happens during an annual well check? The services and screenings done by your health care provider during your annual well check will depend on your age, overall health, lifestyle risk factors, and family history of disease. Counseling  Your health care provider may ask you questions about your: Alcohol use. Tobacco use. Drug use. Emotional well-being. Home and relationship well-being. Sexual activity. Eating habits. History of falls. Memory and ability to understand (cognition). Work and work Statistician. Reproductive health. Screening  You may have the following tests or measurements: Height, weight, and BMI. Blood pressure. Lipid and cholesterol levels. These may be checked every 5 years, or more frequently if you are over 32 years old. Skin check. Lung cancer screening. You may have this screening every year starting at age 25 if you have a 30-pack-year history of smoking and currently smoke or have quit within the past 15 years. Fecal occult blood test (FOBT) of the stool. You may have this test every  year starting at age 62. Flexible sigmoidoscopy or colonoscopy. You may have a sigmoidoscopy every 5 years or a colonoscopy every 10 years starting at age 79. Hepatitis C blood test. Hepatitis B blood test. Sexually transmitted disease (STD) testing. Diabetes screening. This is done by checking your blood sugar (glucose) after you have not eaten for a while (fasting). You may have this done every 1-3 years. Bone density scan.  This is done to screen for osteoporosis. You may have this done starting at age 37. Mammogram. This may be done every 1-2 years. Talk to your health care provider about how often you should have regular mammograms. Talk with your health care provider about your test results, treatment options, and if necessary, the need for more tests. Vaccines  Your health care provider may recommend certain vaccines, such as: Influenza vaccine. This is recommended every year. Tetanus, diphtheria, and acellular pertussis (Tdap, Td) vaccine. You may need a Td booster every 10 years. Zoster vaccine. You may need this after age 47. Pneumococcal 13-valent conjugate (PCV13) vaccine. One dose is recommended after age 43. Pneumococcal polysaccharide (PPSV23) vaccine. One dose is recommended after age 35. Talk to your health care provider about which screenings and vaccines you need and how often you need them. This information is not intended to replace advice given to you by your health care provider. Make sure you discuss any questions you have with your health care provider. Document Released: 09/15/2015 Document Revised: 05/08/2016 Document Reviewed: 06/20/2015 Elsevier Interactive Patient Education  2017 [place] Prevention in the Home Falls can cause injuries. They can happen to people of all ages. There are many things you can do to make your home safe and to help prevent falls. What can I do on the outside of my home? Regularly fix the edges of walkways and driveways and fix any cracks. Remove anything that might make you trip as you walk through a door, such as a raised step or threshold. Trim any bushes or trees on the path to your home. Use bright outdoor lighting. Clear any walking paths of anything that might make someone trip, such as rocks or tools. Regularly check to see if handrails are loose or broken. Make sure that both sides of any steps have handrails. Any raised decks and porches  should have guardrails on the edges. Have any leaves, snow, or ice cleared regularly. Use sand or salt on walking paths during winter. Clean up any spills in your garage right away. This includes oil or grease spills. What can I do in the bathroom? Use night lights. Install grab bars by the toilet and in the tub and shower. Do not use towel bars as grab bars. Use non-skid mats or decals in the tub or shower. If you need to sit down in the shower, use a plastic, non-slip stool. Keep the floor dry. Clean up any water that spills on the floor as soon as it happens. Remove soap buildup in the tub or shower regularly. Attach bath mats securely with double-sided non-slip rug tape. Do not have throw rugs and other things on the floor that can make you trip. What can I do in the bedroom? Use night lights. Make sure that you have a light by your bed that is easy to reach. Do not use any sheets or blankets that are too big for your bed. They should not hang down onto the floor. Have a firm chair that has side arms. You can use this  for support while you get dressed. Do not have throw rugs and other things on the floor that can make you trip. What can I do in the kitchen? Clean up any spills right away. Avoid walking on wet floors. Keep items that you use a lot in easy-to-reach places. If you need to reach something above you, use a strong step stool that has a grab bar. Keep electrical cords out of the way. Do not use floor polish or wax that makes floors slippery. If you must use wax, use non-skid floor wax. Do not have throw rugs and other things on the floor that can make you trip. What can I do with my stairs? Do not leave any items on the stairs. Make sure that there are handrails on both sides of the stairs and use them. Fix handrails that are broken or loose. Make sure that handrails are as long as the stairways. Check any carpeting to make sure that it is firmly attached to the stairs.  Fix any carpet that is loose or worn. Avoid having throw rugs at the top or bottom of the stairs. If you do have throw rugs, attach them to the floor with carpet tape. Make sure that you have a light switch at the top of the stairs and the bottom of the stairs. If you do not have them, ask someone to add them for you. What else can I do to help prevent falls? Wear shoes that: Do not have high heels. Have rubber bottoms. Are comfortable and fit you well. Are closed at the toe. Do not wear sandals. If you use a stepladder: Make sure that it is fully opened. Do not climb a closed stepladder. Make sure that both sides of the stepladder are locked into place. Ask someone to hold it for you, if possible. Clearly mark and make sure that you can see: Any grab bars or handrails. First and last steps. Where the edge of each step is. Use tools that help you move around (mobility aids) if they are needed. These include: Canes. Walkers. Scooters. Crutches. Turn on the lights when you go into a dark area. Replace any light bulbs as soon as they burn out. Set up your furniture so you have a clear path. Avoid moving your furniture around. If any of your floors are uneven, fix them. If there are any pets around you, be aware of where they are. Review your medicines with your doctor. Some medicines can make you feel dizzy. This can increase your chance of falling. Ask your doctor what other things that you can do to help prevent falls. This information is not intended to replace advice given to you by your health care provider. Make sure you discuss any questions you have with your health care provider. Document Released: 06/15/2009 Document Revised: 01/25/2016 Document Reviewed: 09/23/2014 Elsevier Interactive Patient Education  2017 [employer].

## 2022-10-28 NOTE — Progress Notes (Addendum)
I connected with  Sarah Todd on 10/28/22 by a audio/telephone and verified that I am speaking with the correct person using two identifiers.  Patient Location: Home  Provider Location: Office/Clinic  I discussed the limitations of evaluation and management by telemedicine. The patient expressed understanding and agreed to proceed.  Subjective:   Sarah Todd is a 77 y.o. female who presents for Medicare Annual (Subsequent) preventive examination.  Review of Systems    Cardiac Risk Factors include: advanced age (>102mn, >>17women);dyslipidemia;hypertension    Objective:    Today's Vitals   10/28/22 1354  Weight: 143 lb (64.9 kg)  Height: '5\' 2"'$  (1.575 m)   Body mass index is 26.16 kg/m.     10/28/2022    2:23 PM 10/24/2021    3:03 PM 12/06/2020   12:47 PM 12/06/2020    7:34 AM 11/28/2020   11:01 AM 02/29/2020    1:34 PM 01/25/2020    8:03 AM  Advanced Directives  Does Patient Have a Medical Advance Directive? Yes Yes Yes Yes Yes Yes Yes  Type of Advance Directive  Living will Living will Living will Living will [place] Living will  Does patient want to make changes to medical advance directive?  Yes (Inpatient - patient defers changing a medical advance directive and declines information at this time) No - Patient declined No - Patient declined No - Patient declined    Copy of [place]?      Yes - validated most recent copy scanned in chart (See row information)   Would patient like information on creating a medical advance directive?   No - Patient declined No - Patient declined       Current Medications (verified) Outpatient Encounter Medications as of 10/28/2022  Medication Sig   acetaminophen (TYLENOL) 650 MG CR tablet Take 2,600 mg by mouth daily as needed for pain.   aspirin EC 81 MG tablet Take 1 tablet (81 mg total) by mouth daily. Swallow whole.   b complex vitamins capsule Take 1 capsule by  mouth daily.   Cholecalciferol (VITAMIN D) 50 MCG (2000 UT) tablet Take 2,000 Units by mouth daily.   Coenzyme Q10 (COQ-10) 100 MG capsule Take 1 capsule (100 mg total) by mouth daily.   cycloSPORINE (RESTASIS) 0.05 % ophthalmic emulsion Place 1 drop into both eyes 2 (two) times daily.   estradiol (ESTRACE) 0.5 MG tablet TAKE ONE TABLET EVERY DAY   gabapentin (NEURONTIN) 600 MG tablet Take 600-900 mg by mouth See admin instructions. Take 900 mg by mouth in the morning and 900 mg at night   hydrocortisone 2.5 % cream Apply 1 Application topically 2 (two) times daily as needed (irritation).   losartan (COZAAR) 100 MG tablet TAKE 1 TABLET BY MOUTH DAILY   metoprolol succinate (TOPROL-XL) 25 MG 24 hr tablet TAKE 1 TABLET BY MOUTH DAILY   Multiple Vitamin (MULTIVITAMIN) capsule Take 1 capsule by mouth daily.    Omega-3 Fatty Acids (FISH OIL) 1000 MG CAPS Take 1,000-2,000 mg by mouth See admin instructions. Take 1000 mg in the morning and 2000 mg at night   Probiotic Product (PROBIOTIC PO) Take 1 capsule by mouth daily.   rosuvastatin (CRESTOR) 20 MG tablet TAKE 1 TABLET BY MOUTH DAILY   triamcinolone cream (KENALOG) 0.1 % Apply 1 application topically 2 (two) times daily as needed.   vitamin C (ASCORBIC ACID) 500 MG tablet Take 500 mg by mouth daily.  White Petrolatum-Mineral Oil (GENTEAL TEARS NIGHT-TIME) OINT Apply to eye.   doxycycline (MONODOX) 50 MG capsule Take 50 mg by mouth 2 (two) times daily. (Patient not taking: Reported on 10/28/2022)   omeprazole (PRILOSEC) 20 MG capsule Take 1 capsule (20 mg total) by mouth daily. (Patient not taking: Reported on 10/28/2022)   No facility-administered encounter medications on file as of 10/28/2022.    Allergies (verified) Lisinopril, Shrimp [shellfish allergy], and Aspercreme [trolamine salicylate]   History: Past Medical History:  Diagnosis Date   Allergy    Arthritis    hands   Diastolic dysfunction    a. 08/2021 Echo: EF 55-60%, no rwma.  GrII DD. Nl RV fxn. Mild MR. Ao sclerosis w/o stenosis.   Family history of adverse reaction to anesthesia    sister but not sure of what happened   GERD (gastroesophageal reflux disease)    History of cardiovascular stress test    a. 10/2021 MV: EF 88%. No ischemia/infarct. CT w/ mild Cor Ca2+ and Ao atherosclerosis.  Low risk scan.   Hyperlipidemia    Hypertension    Osteoporosis    Varicose veins    Mild   Past Surgical History:  Procedure Laterality Date   ABDOMINAL HYSTERECTOMY     ANTERIOR CERVICAL DECOMP/DISCECTOMY FUSION N/A 07/21/2019   Procedure: ANTERIOR CERVICAL DECOMPRESSION/DISCECTOMY FUSION CERVICAL THREE- CERVICAL FOUR, CERVICAL FOUR- CERVICAL FIVE, CERVICAL FIVE- CERVICAL SIX;  Surgeon: Jovita Gamma, MD;  Location: [place];  Service: Neurosurgery;  Laterality: N/A;  ANTERIOR CERVICAL DECOMPRESSION/DISCECTOMY FUSION CERVICAL 3- CERVICAL 4, CERVICAL 4- CERVICAL 5, CERVICAL 5- CERVICAL 6   BACK SURGERY     cervical and lumbar     COLONOSCOPY N/A 01/05/2015   Procedure: COLONOSCOPY;  Surgeon: Lucilla Lame, MD;  Location: [place];  Service: Gastroenterology;  Laterality: N/A;   COLONOSCOPY WITH PROPOFOL N/A 01/25/2020   Procedure: COLONOSCOPY WITH PROPOFOL;  Surgeon: Lucilla Lame, MD;  Location: [facility];  Service: Endoscopy;  Laterality: N/A;   DILATION AND CURETTAGE OF UTERUS     FRACTURE SURGERY     clavicle right   TOOTH EXTRACTION     TOTAL HIP ARTHROPLASTY Right 12/06/2020   Procedure: TOTAL HIP ARTHROPLASTY ANTERIOR APPROACH;  Surgeon: Lovell Sheehan, MD;  Location: [facility];  Service: Orthopedics;  Laterality: Right;   TUBAL LIGATION     Family History  Problem Relation Age of Onset   Cancer Mother        colon cancer   Alzheimer's disease Father    Heart disease Father    Hypertension Father    Cancer Sister        uterine   Breast cancer Neg Hx    Social History   Socioeconomic History   Marital status: Married    Spouse name: Sherriann Landino   Number of children: 3   Years of education: 15   Highest education level: Some college, no degree  Occupational History   Occupation: retired  Tobacco Use   Smoking status: Never   Smokeless tobacco: Never  Vaping Use   Vaping Use: Never used  Substance and Sexual Activity   Alcohol use: No   Drug use: No   Sexual activity: Yes  Other Topics Concern   Not on file  Social History Narrative   Not on file   Social Determinants of Health   Financial Resource Strain: Low Risk  (10/28/2022)   Overall Financial Resource Strain (CARDIA)    Difficulty of Paying Living Expenses:  Not hard at all  Food Insecurity: No Food Insecurity (10/28/2022)   Hunger Vital Sign    Worried About Running Out of Food in the Last Year: Never true    Ran Out of Food in the Last Year: Never true  Transportation Needs: No Transportation Needs (10/28/2022)   PRAPARE - Hydrologist (Medical): No    Lack of Transportation (Non-Medical): No  Physical Activity: Insufficiently Active (10/28/2022)   Exercise Vital Sign    Days of Exercise per Week: 2 days    Minutes of Exercise per Session: 30 min  Stress: Not on file  Social Connections: Moderately Integrated (10/28/2022)   Social Connection and Isolation Panel [NHANES]    Frequency of Communication with Friends and Family: More than three times a week    Frequency of Social Gatherings with Friends and Family: Twice a week    Attends Religious Services: More than 4 times per year    Active Member of [employer] or Organizations: No    Attends Music therapist: Never    Marital Status: Married    Tobacco Counseling Counseling given: Not Answered   Clinical Intake:  Pre-visit preparation completed: Yes  Pain : No/denies pain     BMI - recorded: 26.16 Nutritional Status: BMI 25 -29 Overweight Nutritional Risks: None Diabetes: No  How often do you need to have someone help you when you read  instructions, pamphlets, or other written materials from your doctor or pharmacy?: 1 - Never  Diabetic?no  Interpreter Needed?: No   Activities of Daily Living    10/28/2022    2:23 PM 06/06/2022    1:15 PM  In your present state of health, do you have any difficulty performing the following activities:  Hearing? 0 0  Vision? 0 1  Difficulty concentrating or making decisions? 0 1  Walking or climbing stairs? 1 0  Dressing or bathing? 0 0  Doing errands, shopping? 0 0  Preparing Food and eating ? N   Using the Toilet? N   In the past six months, have you accidently leaked urine? N   Do you have problems with loss of bowel control? N   Managing your Medications? N   Managing your Finances? N   Housekeeping or managing your Housekeeping? N     Patient Care Team: Virginia Crews, MD as PCP - General (Family Medicine) End, Harrell Gave, MD as PCP - Cardiology (Cardiology) Arelia Sneddon, [place] as Consulting Physician (Optometry) Beverly Gust, MD (Otolaryngology) Kary Kos, MD as Consulting Physician (Neurosurgery) Lucilla Lame, MD as Consulting Physician (Gastroenterology) Thornton Park, MD as Referring Physician (Orthopedic Surgery) Vladimir Crofts, MD as Consulting Physician (Neurology)  Indicate any recent Medical Services you may have received from other than [facility] providers in the past year (date may be approximate).     Assessment:   This is a routine wellness examination for Sarah Todd.  Hearing/Vision screen Hearing Screening - Comments:: Adequate hearing Vision Screening - Comments:: Adequate vision w/glasses [facility] Carolan Clines  Dietary issues and exercise activities discussed: Current Exercise Habits: Structured exercise class, Type of exercise: strength training/weights, Time (Minutes): 30, Frequency (Times/Week): 2, Weekly Exercise (Minutes/Week): 60, Intensity: Mild, Exercise limited by: orthopedic condition(s)   Goals Addressed              This Visit's Progress    care coordination activities   Not on track    Care Coordination Interventions: Case Management Program offered and discussed SDOH screening  completed Confirmed patient's next scheduled visit with her PCP 06/06/22 Annual Wellness Visit completed on 10/24/21 Patient declined having any nursing or community resource needs at this time but will discuss program with her provider on 06/06/22      DIET - EAT MORE FRUITS AND VEGETABLES   On track    DIET - [place]   On track    Recommend increasing water intake to 6 glasses a day.      Exercise 3x per week (30 min per time)   Not on track    Recommend to exercise for 3 days a week for at least 30 minutes at a time.        Depression Screen    10/28/2022    2:13 PM 06/06/2022    1:15 PM 04/30/2022    4:35 PM 01/31/2022    4:39 PM 10/24/2021    3:00 PM 06/26/2021   10:25 AM 12/01/2020    1:52 PM  PHQ 2/9 Scores  PHQ - 2 Score 0 0 0 0 0 0 0  PHQ- 9 Score  '6  6  4 3    '$ Fall Risk    10/28/2022    2:01 PM 06/06/2022    1:15 PM 01/31/2022    4:39 PM 10/24/2021    3:05 PM 10/21/2021    2:04 PM  Fall Risk   Falls in the past year? 0 0 1 0 0  Number falls in past yr: 0 0 0 0   Injury with Fall? 0 0 0 0   Risk for fall due to : No Fall Risks No Fall Risks  No Fall Risks   Follow up Education provided;Falls prevention discussed Falls evaluation completed Falls evaluation completed Falls evaluation completed     FALL RISK PREVENTION PERTAINING TO THE HOME:  Any stairs in or around the home? Yes  If so, are there any without handrails? Yes  Home free of loose throw rugs in walkways, pet beds, electrical cords, etc? Yes  Adequate lighting in your home to reduce risk of falls? Yes   ASSISTIVE DEVICES UTILIZED TO PREVENT FALLS:  Life alert? No  Use of a cane, walker or w/c? No  Grab bars in the bathroom? No  Shower chair or bench in shower? No  Elevated toilet seat or a handicapped toilet? No   Cognitive  Function:        10/28/2022    2:24 PM 02/29/2020    1:39 PM 12/20/2019    1:58 PM 02/23/2019    2:53 PM 12/18/2017    2:11 PM  6CIT Screen  What Year? 0 points 0 points 0 points 0 points 0 points  What month? 0 points 0 points 0 points 0 points 0 points  What time? 0 points 0 points 0 points 0 points 0 points  Count back from 20 0 points 0 points 0 points 0 points 0 points  Months in reverse 0 points 0 points 0 points 0 points 0 points  Repeat phrase 2 points 0 points 6 points 0 points 0 points  Total Score 2 points 0 points 6 points 0 points 0 points    Immunizations Immunization History  Administered Date(s) Administered   Fluad Quad(high Dose 65+) 06/06/2022   Influenza,inj,Quad PF,6+ Mos 06/15/2015   Influenza-Unspecified 05/13/2017, 06/09/2018, 05/18/2019, 07/28/2020, 07/27/2021   Moderna Covid-19 Vaccine Bivalent Booster 51yr & up 03/01/2021, 07/27/2021   Moderna Sars-Covid-2 Vaccination 10/30/2019, 11/27/2019, 08/11/2020   Pneumococcal Conjugate-13 11/17/2014   Pneumococcal  Polysaccharide-23 08/03/2013   Respiratory Syncytial Virus Vaccine,Recomb Aduvanted(Arexvy) 07/16/2022   Tdap 10/16/2016   Zoster Recombinat (Shingrix) 09/09/2018, 03/09/2019    TDAP status: Up to date  Flu Vaccine status: Up to date  Pneumococcal vaccine status: Up to date  Covid-19 vaccine status: Completed vaccines  Qualifies for Shingles Vaccine? Yes   Zostavax completed Yes   Shingrix Completed?: Yes  Screening Tests Health Maintenance  Topic Date Due   COVID-19 Vaccine (6 - 2023-24 season) 05/03/2022   Medicare Annual Wellness (AWV)  10/29/2023   COLONOSCOPY (Pts 45-36yr Insurance coverage will need to be confirmed)  01/24/2025   DTaP/Tdap/Td (2 - Td or Tdap) 10/16/2026   Pneumonia Vaccine 79 Years old  Completed   INFLUENZA VACCINE  Completed   DEXA SCAN  Completed   Hepatitis C Screening  Completed   Zoster Vaccines- Shingrix  Completed   HPV VACCINES  Aged Out      Health Maintenance  Health Maintenance Due  Topic Date Due   COVID-19 Vaccine (6 - 2023-24 season) 05/03/2022    Colorectal cancer screening: Type of screening: Colonoscopy. Completed yes. Repeat every 5 years pt wants to continue as mom had colon cancer  Mammogram status: Completed yes. Repeat every year  Bone Density status: Completed yes. Results reflect: Bone density results: NORMAL. Repeat every 5 years.Order placed  Lung Cancer Screening: (Low Dose CT Chest recommended if Age 79-80years, 30 pack-year currently smoking OR have quit w/in 15years.) does not qualify.   Lung Cancer Screening Referral: no  Additional Screening:  Hepatitis C Screening: does not qualify; Completed yes  Vision Screening: Recommended annual ophthalmology exams for early detection of glaucoma and other disorders of the eye. Is the patient up to date with their annual eye exam?  Yes  Who is the provider or what is the name of the office in which the patient attends annual [place] exams[place] not established with a provider, would they like to be referred to a provider to establish care? No .   Dental Screening: Recommended annual dental exams for proper oral hygiene  Community Resource Referral / Chronic Care Management: CRR required this visit?  No   CCM required this visit?  No      Plan:     I have personally reviewed and noted the following in the patient's chart:   Medical and social history Use of alcohol, tobacco or illicit drugs  Current medications and supplements including opioid prescriptions. Patient is not currently taking opioid prescriptions. Functional ability and status Nutritional status Physical activity Advanced directives List of other physicians Hospitalizations, surgeries, and ER visits in previous 12 months Vitals Screenings to include cognitive, depression, and falls Referrals and appointments  In addition, I have reviewed and discussed  with patient certain preventive protocols, quality metrics, and best practice recommendations. A written personalized care plan for preventive services as well as general preventive health recommendations were provided to patient.     BMEREDETH MELLEMA LPN   [id number]  Nurse Notes: pt is doing well: no concerns or questions. MMG/Bone Density orders referrals placed for future. Pt relays she would like to continue MMG

## 2022-12-10 ENCOUNTER — Encounter: Payer: Self-pay | Admitting: Family Medicine

## 2022-12-10 ENCOUNTER — Ambulatory Visit (INDEPENDENT_AMBULATORY_CARE_PROVIDER_SITE_OTHER): Payer: PPO | Admitting: Family Medicine

## 2022-12-10 ENCOUNTER — Ambulatory Visit: Payer: Self-pay

## 2022-12-10 VITALS — BP 113/68 | HR 67 | Temp 98.7°F | Ht 62.0 in | Wt 143.2 lb

## 2022-12-10 DIAGNOSIS — J0140 Acute pansinusitis, unspecified: Secondary | ICD-10-CM | POA: Insufficient documentation

## 2022-12-10 MED ORDER — IPRATROPIUM BROMIDE 0.06 % NA SOLN: 2.0000 | Freq: Four times a day (QID) | NASAL | 0 refills | Status: DC

## 2022-12-10 MED ORDER — AMOXICILLIN-POT CLAVULANATE 875-125 MG PO TABS: 1.0000 | ORAL_TABLET | Freq: Two times a day (BID) | ORAL | 0 refills | Status: DC

## 2022-12-10 NOTE — Progress Notes (Signed)
I,J'ya E Hunter,acting as a scribe for Jacky Kindle, FNP.,have documented all relevant documentation on the behalf of Jacky Kindle, FNP,as directed by  Jacky Kindle, FNP while in the presence of Jacky Kindle, FNP.   Established patient visit   Patient: Sarah Todd   DOB: [birth year]   77 y.o. Female  MRN: [id number] Visit Date: 12/10/2022  Today's healthcare provider: Jacky Kindle, FNP  Introduced to nurse practitioner role and practice setting.  All questions answered.  Discussed provider/patient relationship and expectations.  Chief Complaint  Patient presents with   URI   Subjective    HPI  UPPER RESPIRATORY TRACT INFECTION Worst symptom: Fever: no Cough: yes Shortness of breath: no Wheezing: no Chest pain: no Chest tightness: no Chest congestion: no Nasal congestion: yes Runny nose: yes Post nasal drip: no Sneezing: yes Sore throat: yes Swollen glands: no Sinus pressure: no Headache: yes Face pain: yes Toothache: no Ear pain: no bilateral Ear pressure: no bilateral Eyes red/itching:no Eye drainage/crusting: no  Vomiting: no Rash: no Fatigue: yes Sick contacts: no Strep contacts: no  Context: worse Recurrent sinusitis:  n/a Relief with OTC cold/cough medications: yes  Treatments attempted: cold/sinus, mucinex, and cough syrup Onset 9 days ago (12/01/2022)   Medications: Outpatient Medications Prior to Visit  Medication Sig   acetaminophen (TYLENOL) 650 MG CR tablet Take 2,600 mg by mouth daily as needed for pain.   aspirin EC 81 MG tablet Take 1 tablet (81 mg total) by mouth daily. Swallow whole.   b complex vitamins capsule Take 1 capsule by mouth daily.   Cholecalciferol (VITAMIN D) 50 MCG (2000 UT) tablet Take 2,000 Units by mouth daily.   Coenzyme Q10 (COQ-10) 100 MG capsule Take 1 capsule (100 mg total) by mouth daily.   cycloSPORINE (RESTASIS) 0.05 % ophthalmic emulsion Place 1 drop into both eyes 2 (two) times daily.   estradiol  (ESTRACE) 0.5 MG tablet TAKE ONE TABLET EVERY DAY   gabapentin (NEURONTIN) 600 MG tablet Take 600-900 mg by mouth See admin instructions. Take 900 mg by mouth in the morning and 900 mg at night   hydrocortisone 2.5 % cream Apply 1 Application topically 2 (two) times daily as needed (irritation).   losartan (COZAAR) 100 MG tablet TAKE 1 TABLET BY MOUTH DAILY   metoprolol succinate (TOPROL-XL) 25 MG 24 hr tablet TAKE 1 TABLET BY MOUTH DAILY   Multiple Vitamin (MULTIVITAMIN) capsule Take 1 capsule by mouth daily.    Omega-3 Fatty Acids (FISH OIL) 1000 MG CAPS Take 1,000-2,000 mg by mouth See admin instructions. Take 1000 mg in the morning and 2000 mg at night   Probiotic Product (PROBIOTIC PO) Take 1 capsule by mouth daily.   rosuvastatin (CRESTOR) 20 MG tablet TAKE 1 TABLET BY MOUTH DAILY   triamcinolone cream (KENALOG) 0.1 % Apply 1 application topically 2 (two) times daily as needed.   vitamin C (ASCORBIC ACID) 500 MG tablet Take 500 mg by mouth daily.    White Petrolatum-Mineral Oil (GENTEAL TEARS NIGHT-TIME) OINT Apply to eye.   doxycycline (MONODOX) 50 MG capsule Take 50 mg by mouth 2 (two) times daily. (Patient not taking: Reported on 10/28/2022)   omeprazole (PRILOSEC) 20 MG capsule Take 1 capsule (20 mg total) by mouth daily. (Patient not taking: Reported on 10/28/2022)   No facility-administered medications prior to visit.    Review of Systems    Objective    BP 113/68 (BP Location: Left Arm, Patient Position: Sitting,  Cuff Size: Large)   Pulse 67   Temp 98.7 F (37.1 C) (Oral)   Ht 5\' 2"  (1.575 m)   Wt 143 lb 3.2 oz (65 kg)   BMI 26.19 kg/m   Physical Exam Vitals and nursing note reviewed.  Constitutional:      General: She is not in acute distress.    Appearance: Normal appearance. She is overweight. She is not ill-appearing, toxic-appearing or diaphoretic.  HENT:     Head: Normocephalic and atraumatic.     Right Ear: Tympanic membrane, ear canal and external ear normal.      Nose:     Right Sinus: Maxillary sinus tenderness and frontal sinus tenderness present.     Left Sinus: Maxillary sinus tenderness and frontal sinus tenderness present.     Mouth/Throat:     Mouth: Mucous membranes are moist.     Pharynx: Oropharynx is clear. Posterior oropharyngeal erythema present.  Eyes:     Extraocular Movements: Extraocular movements intact.     Pupils: Pupils are equal, round, and reactive to light.  Cardiovascular:     Rate and Rhythm: Normal rate and regular rhythm.     Pulses: Normal pulses.     Heart sounds: Normal heart sounds. No murmur heard.    No friction rub. No gallop.  Pulmonary:     Effort: Pulmonary effort is normal. No respiratory distress.     Breath sounds: Normal breath sounds. No stridor. No wheezing, rhonchi or rales.  Chest:     Chest wall: No tenderness.  Abdominal:     General: Bowel sounds are normal.     Palpations: Abdomen is soft.  Musculoskeletal:        General: No swelling, tenderness, deformity or signs of injury. Normal range of motion.     Right lower leg: No edema.     Left lower leg: No edema.  Skin:    General: Skin is warm and dry.     Capillary Refill: Capillary refill takes less than 2 seconds.     Coloration: Skin is not jaundiced or pale.     Findings: No bruising, erythema, lesion or rash.  Neurological:     General: No focal deficit present.     Mental Status: She is alert and oriented to person, place, and time. Mental status is at baseline.     Cranial Nerves: No cranial nerve deficit.     Sensory: No sensory deficit.     Motor: No weakness.     Coordination: Coordination normal.  Psychiatric:        Mood and Affect: Mood normal.        Behavior: Behavior normal.        Thought Content: Thought content normal.        Judgment: Judgment normal.      No results found for any visits on 12/10/22.  Assessment & Plan     Problem List Items Addressed This Visit       Respiratory   Acute  non-recurrent pansinusitis - Primary    Acute, self limiting; out of window for Flu or COVID testing; no obvious s/s of strep 9 days of s/s without relief from OTC medications Recommend use of ABX and nasal spray Defer use of steroids without asthma or tobacco history Continue supportive care measures RTC as needed      Relevant Medications   amoxicillin-clavulanate (AUGMENTIN) 875-125 MG tablet   ipratropium (ATROVENT) 0.06 % nasal spray   Return if symptoms worsen  or fail to improve.     Leilani Merl, FNP, have reviewed all documentation for this visit. The documentation on 12/15/22 for the exam, diagnosis, procedures, and orders are all accurate and complete.  Jacky Kindle, FNP  [facility] [phone] (phone) [phone] (fax)  [facility]

## 2022-12-10 NOTE — Telephone Encounter (Signed)
  Chief Complaint: Lingering URI - possible fever Symptoms: cough, sinus congestion Frequency: 10 days Pertinent Negatives: Patient denies  Disposition: [] ED /[] Urgent Care (no appt availability in office) / [x] Appointment(In office/virtual)/ []  [facility] [facility]/ [] Home Care/ [] Refused Recommended Disposition /[] [facility]/ []  Follow-up with PCP Additional Notes: PT has had s/s of URI since last Sunday. Pt currently has productive cough , sinus congestion feels hot and sore throat.  Pt has taken OTC medications.  Summary: sore throat and congestion   Pt stated has had a cold since last Sunday, and now it\'s going into the second week. Has been taking many things OTC Mucinex, mucus release, cough syrup, currently experiencing sore throat and congestion, pt declined to schedule an appointment.  Pt seeking clinical advice on what she should do that will help clear things up.     Reason for Disposition  Fever present > 3 days (72 hours)  Answer Assessment - Initial Assessment Questions 1. ONSET: "When did the cough begin?"      10  days 2. SEVERITY: "How bad is the cough today?"      moderate 3. SPUTUM: "Describe the color of your sputum" (none, dry cough; clear, white, yellow, green)     unsure 4. HEMOPTYSIS: "Are you coughing up any blood?" If so ask: "How much?" (flecks, streaks, tablespoons, etc.)      5. DIFFICULTY BREATHING: "Are you having difficulty breathing?" If Yes, ask: "How bad is it?" (e.g., mild, moderate, severe)    - MILD: No SOB at rest, mild SOB with walking, speaks normally in sentences, can lie down, no retractions, pulse < 100.    - MODERATE: SOB at rest, SOB with minimal exertion and prefers to sit, cannot lie down flat, speaks in phrases, mild retractions, audible wheezing, pulse 100-120.    - SEVERE: Very SOB at rest, speaks in single words, struggling to breathe, sitting hunched forward, retractions, pulse > 120      Yes from sinus congestion 6.  FEVER: "Do you have a fever?" If Yes, ask: "What is your temperature, how was it measured, and when did it start?"     Has felt hot from time to time. 7. CARDIAC HISTORY: "Do you have any history of heart disease?" (e.g., heart attack, congestive heart failure)      no 8. LUNG HISTORY: "Do you have any history of lung disease?"  (e.g., pulmonary embolus, asthma, emphysema)     no 10. OTHER SYMPTOMS: "Do you have any other symptoms?" (e.g., runny nose, wheezing, chest pain)       Sore throat, sinus congestion  Protocols used: Cough - Acute Productive-A-AH

## 2022-12-10 NOTE — Patient Instructions (Signed)
Some things that can make you feel better are: - Increased rest - Increasing Fluids - Acetaminophen / ibuprofen as needed for fever/pain.  - Salt water gargling, chloraseptic spray and throat lozenges - OTC pseudoephedrine.  - Mucinex.  - Saline sinus flushes or a neti pot.  - Humidifying the air.  

## 2022-12-15 NOTE — Assessment & Plan Note (Signed)
Acute, self limiting; out of window for Flu or COVID testing; no obvious s/s of strep 9 days of s/s without relief from OTC medications Recommend use of ABX and nasal spray Defer use of steroids without asthma or tobacco history Continue supportive care measures RTC as needed

## 2022-12-16 ENCOUNTER — Ambulatory Visit: Payer: Self-pay | Admitting: *Deleted

## 2022-12-16 ENCOUNTER — Other Ambulatory Visit: Payer: Self-pay | Admitting: Nurse Practitioner

## 2022-12-16 NOTE — Telephone Encounter (Signed)
  Chief Complaint: Taking Amoxicillin for URI sinusitis now having diarrhea 5-6 times a day.  Seen on 12/10/2022. Symptoms: diarrhea 5-6 times a day Frequency: above started about 1/2 a week after starting the Amoxicillin. Pertinent Negatives: Patient denies dizziness but feels weak and drained Disposition: [] ED /[] Urgent Care (no appt availability in office) / [] Appointment(In office/virtual)/ []  [facility]/ [] Home Care/ [] Refused Recommended Disposition /[] [facility]/ [x]  Follow-up with PCP Additional Notes: Seeking direction from Merita Norton what she should do.   Has 3 pills left.   She is still having some sinus congestion but mostly the cough is the big issue.  Message sent to Merita Norton.   Pt agreeable to someone calling her back.

## 2022-12-16 NOTE — Telephone Encounter (Signed)
Message from Cheri Guppy sent at 12/16/2022 10:31 AM EDT  Summary: Diarrhea   Pt was seen last week at the doctor's office and was prescribed amoxicillin-clavulanate (AUGMENTIN) 875-125 MG tablet [[id number]]. Pt says she has been having diarrhea since taking the medication. Pt says the diarrhea is all through the day and sometimes at night and comes without warning. Pt is concerned and wants to know if that is a normal side effect or if it's something else.          Call History   Type Contact Phone/Fax User  12/16/2022 10:28 AM EDT Phone (Incoming) [name] "Sharl Ma" (Self) [phone] Judie Petit) Colon Flattery M   Reason for Disposition  Patient wants to stop the antibiotic  Answer Assessment - Initial Assessment Questions 1. ANTIBIOTIC: "What antibiotic are you taking?" "How many times per day?"        She said Amoxicillin is what she is on.   I'd been on it for 1/2 a week and the diarrhea started.    I'm going 5-6 times a day. 2. ANTIBIOTIC ONSET: "When was the antibiotic started?"     12/10/2022 the Amoxicillin was started. 3. DIARRHEA SEVERITY: "How bad is the diarrhea?" "How many more stools have you had in the past 24 hours than normal?"    - NO DIARRHEA (SCALE 0)   - MILD (SCALE 1-3): Few loose or mushy BMs; increase of 1-3 stools over normal daily number of stools; mild increase in ostomy output.   -  MODERATE (SCALE 4-7): Increase of 4-6 stools daily over normal; moderate increase in ostomy output. * SEVERE (SCALE 8-10; OR 'WORST POSSIBLE'): Increase of 7 or more stools daily over normal; moderate increase in ostomy output; incontinence.     Going 5-6 times a day though the amount is less and less each day. 4. ONSET: "When did the diarrhea begin?"      After I had been on the Amoxicillin 1/2 a week. 5. BM CONSISTENCY: "How loose or watery is the diarrhea?"      Watery.     It started all of a sudden. 6. VOMITING: "Are you also vomiting?" If Yes, ask: "How  many times in the past 24 hours?"      No 7. ABDOMEN PAIN: "Are you having any abdomen pain?" If Yes, ask: "What does it feel like?" (e.g., crampy, dull, intermittent, constant)      Not asked 8. ABDOMEN PAIN SEVERITY: If present, ask: "How bad is the pain?"  (e.g., Scale 1-10; mild, moderate, or severe)   - MILD (1-3): doesn't interfere with normal activities, abdomen soft and not tender to touch    - MODERATE (4-7): interferes with normal activities or awakens from sleep, abdomen tender to touch    - SEVERE (8-10): excruciating pain, doubled over, unable to do any normal activities       Not asked 9. ORAL INTAKE: If vomiting, "Have you been able to drink liquids?" "How much liquids have you had in the past 24 hours?"     Encouraged to drink plenty of fluids to prevent dehydration. 10. HYDRATION: "Any signs of dehydration?" (e.g., dry mouth [not just dry lips], too weak to stand, dizziness, new weight loss) "When did you last urinate?"       Just weak and drained from the diarrhea. 11. EXPOSURE: "Have you traveled to a foreign country recently?" "Have you been exposed to anyone with diarrhea?" "Could you have eaten any food that was spoiled?"  Not asked 12. OTHER SYMPTOMS: "Do you have any other symptoms?" (e.g., fever, blood in stool)       Sinus infection is better but still has the cough with some nasal congestion still. 13. PREGNANCY: "Is there any chance you are pregnant?" "When was your last menstrual period?"       N/A due to age  Protocols used: Diarrhea on Antibiotics-A-AH

## 2022-12-18 NOTE — Telephone Encounter (Signed)
Pt called, advised of message from Garrett, NP. Pt states she has completed abx and isn't having diarrhea as bad today but will try OTC cough medicine. No further assistance needed.

## 2022-12-19 ENCOUNTER — Ambulatory Visit
Admission: RE | Admit: 2022-12-19 | Discharge: 2022-12-19 | Disposition: A | Discharge status: Home / Self Care | Payer: PPO | Source: Ambulatory Visit | Attending: Family Medicine | Admitting: Family Medicine

## 2022-12-19 DIAGNOSIS — E559 Vitamin D deficiency, unspecified: Secondary | ICD-10-CM | POA: Insufficient documentation

## 2022-12-19 DIAGNOSIS — Z13820 Encounter for screening for osteoporosis: Secondary | ICD-10-CM | POA: Diagnosis not present

## 2022-12-19 DIAGNOSIS — Z780 Asymptomatic menopausal state: Secondary | ICD-10-CM | POA: Diagnosis not present

## 2022-12-23 DIAGNOSIS — R070 Pain in throat: Secondary | ICD-10-CM | POA: Diagnosis not present

## 2022-12-23 DIAGNOSIS — R0981 Nasal congestion: Secondary | ICD-10-CM | POA: Diagnosis not present

## 2022-12-23 DIAGNOSIS — J309 Allergic rhinitis, unspecified: Secondary | ICD-10-CM | POA: Diagnosis not present

## 2022-12-23 DIAGNOSIS — J329 Chronic sinusitis, unspecified: Secondary | ICD-10-CM | POA: Diagnosis not present

## 2022-12-23 DIAGNOSIS — J301 Allergic rhinitis due to pollen: Secondary | ICD-10-CM | POA: Diagnosis not present

## 2022-12-30 DIAGNOSIS — R070 Pain in throat: Secondary | ICD-10-CM | POA: Diagnosis not present

## 2022-12-30 DIAGNOSIS — J329 Chronic sinusitis, unspecified: Secondary | ICD-10-CM | POA: Diagnosis not present

## 2022-12-30 DIAGNOSIS — J309 Allergic rhinitis, unspecified: Secondary | ICD-10-CM | POA: Diagnosis not present

## 2022-12-30 DIAGNOSIS — R0981 Nasal congestion: Secondary | ICD-10-CM | POA: Diagnosis not present

## 2023-01-02 ENCOUNTER — Telehealth: Payer: Self-pay

## 2023-01-02 ENCOUNTER — Ambulatory Visit (INDEPENDENT_AMBULATORY_CARE_PROVIDER_SITE_OTHER): Payer: PPO | Admitting: Physician Assistant

## 2023-01-02 VITALS — BP 130/64 | HR 84 | Wt 144.4 lb

## 2023-01-02 DIAGNOSIS — J329 Chronic sinusitis, unspecified: Secondary | ICD-10-CM

## 2023-01-02 MED ORDER — PREDNISONE 20 MG PO TABS: 20.0000 mg | ORAL_TABLET | Freq: Every day | ORAL | 0 refills | Status: DC

## 2023-01-02 NOTE — Progress Notes (Signed)
I,Sha'taria Tyson,acting as a Neurosurgeon for [employer], PA-C.,have documented all relevant documentation on the behalf of Alfredia Ferguson, PA-C,as directed by  Alfredia Ferguson, PA-C while in the presence of Alfredia Ferguson, PA-C.   Established patient visit   Patient: Sarah Todd   DOB: 1946/08/06   77 y.o. Female  MRN: [id number] Visit Date: 01/02/2023  Today's healthcare provider: Alfredia Ferguson, PA-C   Cc. Nasal congestion, cough, persistent.  Subjective    HPI  Patient reports she was seen a few weeks ago (12/10/22) and had a virus that was present since easter. Reports she still has a cough, tenderness in her throat when she swallows and gets worse in the evening to the point that by tie she goes to bed she can't swallow, fullness in her ears, left eye waters. Reports she saw ENT where she found out she was allergic to every thing and one thing was cats and she said she has cats on her bed every night and never had any problems. Advised patient to do allergy injections which she declined. Provide rx for flonase, nasal med sinus rinse, prilosec 20 mg for acid reflux( patient reports she doesn't have it but takes TUMs), zyrtec 10 mg. Scheduled CT on sinuses May 14th.    Medications: Outpatient Medications Prior to Visit  Medication Sig   acetaminophen (TYLENOL) 650 MG CR tablet Take 2,600 mg by mouth daily as needed for pain.   aspirin EC 81 MG tablet Take 1 tablet (81 mg total) by mouth daily. Swallow whole.   b complex vitamins capsule Take 1 capsule by mouth daily.   Cholecalciferol (VITAMIN D) 50 MCG (2000 UT) tablet Take 2,000 Units by mouth daily.   Coenzyme Q10 (COQ-10) 100 MG capsule Take 1 capsule (100 mg total) by mouth daily.   cycloSPORINE (RESTASIS) 0.05 % ophthalmic emulsion Place 1 drop into both eyes 2 (two) times daily.   estradiol (ESTRACE) 0.5 MG tablet TAKE ONE TABLET EVERY DAY   gabapentin (NEURONTIN) 600 MG tablet Take 600-900 mg by mouth See admin  instructions. Take 900 mg by mouth in the morning and 900 mg at night   hydrocortisone 2.5 % cream Apply 1 Application topically 2 (two) times daily as needed (irritation).   ipratropium (ATROVENT) 0.06 % nasal spray Place 2 sprays into both nostrils 4 (four) times daily.   losartan (COZAAR) 100 MG tablet TAKE 1 TABLET BY MOUTH DAILY   metoprolol succinate (TOPROL-XL) 25 MG 24 hr tablet TAKE 1 TABLET BY MOUTH DAILY   Multiple Vitamin (MULTIVITAMIN) capsule Take 1 capsule by mouth daily.    Omega-3 Fatty Acids (FISH OIL) 1000 MG CAPS Take 1,000-2,000 mg by mouth See admin instructions. Take 1000 mg in the morning and 2000 mg at night   omeprazole (PRILOSEC) 20 MG capsule Take 1 capsule (20 mg total) by mouth daily. (Patient not taking: Reported on 10/28/2022)   Probiotic Product (PROBIOTIC PO) Take 1 capsule by mouth daily.   rosuvastatin (CRESTOR) 20 MG tablet TAKE 1 TABLET BY MOUTH DAILY   triamcinolone cream (KENALOG) 0.1 % Apply 1 application topically 2 (two) times daily as needed.   vitamin C (ASCORBIC ACID) 500 MG tablet Take 500 mg by mouth daily.    White Petrolatum-Mineral Oil (GENTEAL TEARS NIGHT-TIME) OINT Apply to eye.   [DISCONTINUED] amoxicillin-clavulanate (AUGMENTIN) 875-125 MG tablet Take 1 tablet by mouth 2 (two) times daily.   No facility-administered medications prior to visit.    Review of Systems  Constitutional:  Negative for fatigue and fever.  HENT:  Positive for congestion and sore throat.   Respiratory:  Positive for cough. Negative for shortness of breath.   Cardiovascular:  Negative for chest pain and leg swelling.  Gastrointestinal:  Negative for abdominal pain.  Neurological:  Negative for dizziness and headaches.     Objective    BP 130/64 (BP Location: Left Arm, Patient Position: Sitting, Cuff Size: Normal)   Pulse 84   Wt 144 lb 6.4 oz (65.5 kg)   SpO2 100%   BMI 26.41 kg/m   Physical Exam Constitutional:      General: She is awake.      Appearance: She is well-developed.  HENT:     Head: Normocephalic.     Mouth/Throat:     Mouth: Mucous membranes are dry.     Pharynx: Posterior oropharyngeal erythema present.  Eyes:     Conjunctiva/sclera: Conjunctivae normal.  Cardiovascular:     Rate and Rhythm: Normal rate and regular rhythm.     Heart sounds: Normal heart sounds.  Pulmonary:     Effort: Pulmonary effort is normal.     Breath sounds: Normal breath sounds.  Skin:    General: Skin is warm.  Neurological:     Mental Status: She is alert and oriented to person, place, and time.  Psychiatric:        Attention and Perception: Attention normal.        Mood and Affect: Mood normal.        Speech: Speech normal.        Behavior: Behavior is cooperative.      No results found for any visits on 01/02/23.  Assessment & Plan     1. Rhinosinusitis Clinically pt appears well, some dryness in her throat Wondering if antihist drying her out causing cough vs PND cough . Rx prednisone 20 mg x 5 days, pt would like to stop her allergy therapy to see if there is a difference.  - predniSONE (DELTASONE) 20 MG tablet; Take 1 tablet (20 mg total) by mouth daily with breakfast.  Dispense: 5 tablet; Refill: 0   Return if symptoms worsen or fail to improve.      I, Alfredia Ferguson, PA-C have reviewed all documentation for this visit. The documentation on  01/03/23   for the exam, diagnosis, procedures, and orders are all accurate and complete. Alfredia Ferguson, PA-C [facility] [address] [place], [place], [address] Office: [phone] Fax: [phone]   [facility]

## 2023-01-02 NOTE — Telephone Encounter (Signed)
Copied from CRM [id number]. Topic: Referral - Request for Referral >> Jan 02, 2023 10:29 AM Franchot Heidelberg wrote: Wants to see another allergy specialist concerning her symptoms. She has been told that she has allergies to animals that she has lived around her entire life. Says she has throat symptoms, sore throat, cough.   No appt with Dr. B until the end of May  Also says her sinuses hurt in her forehead.

## 2023-01-02 NOTE — Telephone Encounter (Signed)
Will see pt today.  

## 2023-01-03 ENCOUNTER — Encounter: Payer: Self-pay | Admitting: Physician Assistant

## 2023-01-13 ENCOUNTER — Other Ambulatory Visit: Payer: Self-pay | Admitting: Nurse Practitioner

## 2023-01-13 DIAGNOSIS — Z6825 Body mass index (BMI) 25.0-25.9, adult: Secondary | ICD-10-CM | POA: Diagnosis not present

## 2023-01-13 DIAGNOSIS — M5416 Radiculopathy, lumbar region: Secondary | ICD-10-CM | POA: Diagnosis not present

## 2023-01-13 DIAGNOSIS — M961 Postlaminectomy syndrome, not elsewhere classified: Secondary | ICD-10-CM | POA: Diagnosis not present

## 2023-01-14 DIAGNOSIS — J329 Chronic sinusitis, unspecified: Secondary | ICD-10-CM | POA: Diagnosis not present

## 2023-01-15 ENCOUNTER — Other Ambulatory Visit: Payer: Self-pay | Admitting: Nurse Practitioner

## 2023-01-20 DIAGNOSIS — M75101 Unspecified rotator cuff tear or rupture of right shoulder, not specified as traumatic: Secondary | ICD-10-CM | POA: Diagnosis not present

## 2023-01-20 DIAGNOSIS — Z9889 Other specified postprocedural states: Secondary | ICD-10-CM | POA: Diagnosis not present

## 2023-01-23 ENCOUNTER — Other Ambulatory Visit: Payer: Self-pay | Admitting: Nurse Practitioner

## 2023-01-23 DIAGNOSIS — J329 Chronic sinusitis, unspecified: Secondary | ICD-10-CM | POA: Diagnosis not present

## 2023-03-03 ENCOUNTER — Other Ambulatory Visit: Payer: Self-pay | Admitting: Family Medicine

## 2023-03-03 DIAGNOSIS — Z1231 Encounter for screening mammogram for malignant neoplasm of breast: Secondary | ICD-10-CM

## 2023-03-04 NOTE — Telephone Encounter (Signed)
Requested Prescriptions  Pending Prescriptions Disp Refills   estradiol (ESTRACE) 0.5 MG tablet [Pharmacy Med Name: ESTRADIOL 0.5 MG TAB] 90 tablet 0    Sig: TAKE ONE TABLET EVERY DAY     OB/GYN:  Estrogens Failed - 03/03/2023  4:50 PM      Failed - Mammogram is up-to-date per Health Maintenance      Passed - Last BP in normal range    BP Readings from Last 1 Encounters:  01/02/23 130/64         Passed - Valid encounter within last 12 months    Recent Outpatient Visits           2 months ago Rhinosinusitis   [facility] Alfredia Ferguson, PA-C   2 months ago Acute non-recurrent pansinusitis   [facility] Jacky Kindle, FNP   9 months ago Essential hypertension   [facility] [place], Marzella Schlein, MD   1 year ago Pain of upper abdomen   [facility] [place], [place], PA-C   1 year ago Encounter for annual physical exam   [facility] [place], Marzella Schlein, MD

## 2023-04-15 ENCOUNTER — Other Ambulatory Visit: Payer: Self-pay | Admitting: Nurse Practitioner

## 2023-04-15 NOTE — Telephone Encounter (Signed)
Please contact pt for future appointment. Pt due for f/u.  

## 2023-04-21 ENCOUNTER — Ambulatory Visit
Admission: RE | Admit: 2023-04-21 | Discharge: 2023-04-21 | Disposition: A | Discharge status: Home / Self Care | Payer: PPO | Source: Ambulatory Visit | Attending: Family Medicine | Admitting: Family Medicine

## 2023-04-21 DIAGNOSIS — Z1231 Encounter for screening mammogram for malignant neoplasm of breast: Secondary | ICD-10-CM | POA: Diagnosis not present

## 2023-05-01 ENCOUNTER — Telehealth: Payer: Self-pay

## 2023-05-01 NOTE — Telephone Encounter (Signed)
Pt given mammogram  results per notes of PCP on 05/01/23. Pt verbalized understanding.

## 2023-05-29 ENCOUNTER — Other Ambulatory Visit: Payer: Self-pay | Admitting: Family Medicine

## 2023-05-30 NOTE — Telephone Encounter (Signed)
Requested Prescriptions  Pending Prescriptions Disp Refills   estradiol (ESTRACE) 0.5 MG tablet [Pharmacy Med Name: ESTRADIOL 0.5 MG TAB] 90 tablet 2    Sig: TAKE ONE TABLET EVERY DAY     OB/GYN:  Estrogens Failed - 05/29/2023  1:51 PM      Failed - Mammogram is up-to-date per Health Maintenance      Passed - Last BP in normal range    BP Readings from Last 1 Encounters:  01/02/23 130/64         Passed - Valid encounter within last 12 months    Recent Outpatient Visits           4 months ago Rhinosinusitis   [facility] Alfredia Ferguson, PA-C   5 months ago Acute non-recurrent pansinusitis   [facility] Jacky Kindle, FNP   11 months ago Essential hypertension   [facility] [employer], Marzella Schlein, MD   1 year ago Pain of upper abdomen   [facility] [place], [place], PA-C   1 year ago Encounter for annual physical exam   [facility] [place], Marzella Schlein, MD

## 2023-06-19 ENCOUNTER — Ambulatory Visit: Payer: PPO | Admitting: Family Medicine

## 2023-06-19 ENCOUNTER — Encounter: Payer: Self-pay | Admitting: Family Medicine

## 2023-06-19 VITALS — BP 145/77 | HR 72 | Temp 97.5°F | Ht 62.0 in | Wt 146.0 lb

## 2023-06-19 DIAGNOSIS — S29011A Strain of muscle and tendon of front wall of thorax, initial encounter: Secondary | ICD-10-CM | POA: Diagnosis not present

## 2023-06-19 DIAGNOSIS — J0110 Acute frontal sinusitis, unspecified: Secondary | ICD-10-CM

## 2023-06-19 MED ORDER — AMOXICILLIN-POT CLAVULANATE 875-125 MG PO TABS: 1.0000 | ORAL_TABLET | Freq: Two times a day (BID) | ORAL | 0 refills | Status: DC

## 2023-06-19 NOTE — Progress Notes (Signed)
Acute Office Visit  Subjective:     Patient ID: Sarah Todd, female    DOB: [birth year], 77 y.o.   MRN: [id number]  Chief Complaint  Patient presents with   Cough    Cough and congestion since last Wednesday. "Coughing up stuff, its yellow."    HPI Discussed the use of AI scribe software for clinical note transcription with the patient, who gave verbal consent to proceed.  History of Present Illness   The patient, with a history of hip replacement and back problems, presents with a prolonged cold that started about eight days ago. The patient has been self-medicating with Robitussin, Mucinex DM, and a CVS equivalent, but the symptoms have not resolved. The patient describes the symptoms as hoarseness, sinus pressure, a cough that produces faint yellow sputum, and a lack of energy. The patient also mentions a pain on the right side, possibly due to coughing. The patient has not taken a COVID-19 test. The patient has been around people at church and Bible study, but no one else has reported being sick. The patient also mentions a planned trip to the beach with friends, which they decided to cancel due to their illness.       ROS per HPI      Objective:    BP (!) 145/77   Pulse 72   Temp (!) 97.5 F (36.4 C) (Oral)   Ht 5\' 2"  (1.575 m)   Wt 146 lb (66.2 kg)   SpO2 99%   BMI 26.70 kg/m    Physical Exam Vitals reviewed.  Constitutional:      General: She is not in acute distress.    Appearance: Normal appearance. She is well-developed. She is not diaphoretic.  HENT:     Head: Normocephalic and atraumatic.     Right Ear: Tympanic membrane, ear canal and external ear normal.     Left Ear: Tympanic membrane, ear canal and external ear normal.     Nose: Congestion present.     Mouth/Throat:     Mouth: Mucous membranes are moist.     Pharynx: Posterior oropharyngeal erythema present. No oropharyngeal exudate.  Eyes:     General: No scleral icterus.     Conjunctiva/sclera: Conjunctivae normal.     Pupils: Pupils are equal, round, and reactive to light.  Cardiovascular:     Rate and Rhythm: Normal rate and regular rhythm.     Heart sounds: Normal heart sounds. No murmur heard. Pulmonary:     Effort: Pulmonary effort is normal. No respiratory distress.     Breath sounds: Normal breath sounds. No wheezing or rales.  Musculoskeletal:     Cervical back: Neck supple.     Right lower leg: No edema.     Left lower leg: No edema.  Lymphadenopathy:     Cervical: No cervical adenopathy.  Skin:    General: Skin is warm and dry.     Findings: No rash.  Neurological:     Mental Status: She is alert.     No results found for any visits on 06/19/23.      Assessment & Plan:   Problem List Items Addressed This Visit   None Visit Diagnoses     Acute non-recurrent frontal sinusitis    -  Primary   Relevant Medications   amoxicillin-clavulanate (AUGMENTIN) 875-125 MG tablet   Muscle strain of chest wall, initial encounter               Upper Respiratory  Infection with possible Sinusitis Symptoms include hoarseness, sinus pressure, cough with faint yellow sputum, and fatigue. Symptoms have been ongoing for 8 days with some improvement then worsening. Overlapping use of Robitussin DM and Mucinex DM noted. -Discontinue overlapping use of Robitussin DM and Mucinex DM. -Continue Mucinex DM twice daily. -Start Augmentin twice daily with food for 1 week. -Use nasal saline as needed for congestion. -If symptoms worsen or do not improve, patient to notify the office.  Musculoskeletal Pain Right-sided pain likely secondary to coughing. -Expected to improve as cough resolves.  General Health Maintenance -Deferred flu shot due to current illness. To consider getting it at a later date when feeling better.        Meds ordered this encounter  Medications   amoxicillin-clavulanate (AUGMENTIN) 875-125 MG tablet    Sig: Take 1 tablet by  mouth 2 (two) times daily for 7 days.    Dispense:  14 tablet    Refill:  0    Return if symptoms worsen or fail to improve.  Shirlee Latch, MD

## 2023-06-24 ENCOUNTER — Ambulatory Visit: Payer: Self-pay

## 2023-06-24 DIAGNOSIS — J0140 Acute pansinusitis, unspecified: Secondary | ICD-10-CM

## 2023-06-24 NOTE — Telephone Encounter (Signed)
Message from Stovall C sent at 06/24/2023  3:33 PM EDT  Summary: rx req / cough and congestion   The patient shares that are continuing to experience cough and congestion  The patient has had symptoms for roughly 2 weeks and shares that they are continuing to experience the discomfort  The patient was previously prescribed amoxicillin-clavulanate (AUGMENTIN) 875-125 MG tablet [[id number]] and would like to take abx again if possible  Please contact when available        Called pt and LM on VM to call back.

## 2023-06-24 NOTE — Telephone Encounter (Signed)
Chief Complaint: Congestion on ABT Symptoms: nasal and chest congestion, productive cough, diarrhea Frequency: constant  Pertinent Negatives: Patient denies fever, nausea, vomiting, headache Disposition: [] ED /[] Urgent Care (no appt availability in office) / [] Appointment(In office/virtual)/ []  [place] [facility]/ [] Home Care/ [] Refused Recommended Disposition /[] [place] [facility]/ [x]  Follow-up with PCP Additional Notes: Patient states she continues to have a productive cough and nasal and chest congestion. Patient states symptoms have improved since starting the antibiotics but the chest congestion is keeping her up at night. Patient started antibiotics on 06/19/23 and has 3 pills left. Care advice given and patient is requesting additional recommendations from PCP on what should be done to get rid of the chest congestion. Advised I would forward message to PCP for additional recommendations at this time.  Summary: rx req / cough and congestion   The patient shares that are continuing to experience cough and congestion  The patient has had symptoms for roughly 2 weeks and shares that they are continuing to experience the discomfort  The patient was previously prescribed amoxicillin-clavulanate (AUGMENTIN) 875-125 MG tablet [[id number]] and would like to take abx again if possible  Please contact when available     Reason for Disposition  [1] Taking antibiotic AND [2] nose still blocked  Answer Assessment - Initial Assessment Questions 1. ANTIBIOTIC: "What antibiotic are you taking?" "How many times a day?"     AUGMENTIN 875-125 MG BID  2. ONSET: "When was the antibiotic started?"     06/19/23 3. PAIN: "How bad is the sinus pain?"   (Scale 1-10; mild, moderate or severe)   - MILD (1-3): doesn't interfere with normal activities    - MODERATE (4-7): interferes with normal activities (e.g., work or school) or awakens from sleep   - SEVERE (8-10): excruciating pain and patient  unable to do any normal activities        Congestion moderate to severe 4. FEVER: "Do you have a fever?" If Yes, ask: "What is it, how was it measured, and when did it start?"      No  5. SYMPTOMS: "Are there any other symptoms you're concerned about?" If Yes, ask: "When did it start?"     Productive cough yellow phlegm, nasal and chest congestion  Protocols used: Sinus Infection on Antibiotic Follow-up Call-A-AH

## 2023-06-25 ENCOUNTER — Telehealth: Payer: Self-pay

## 2023-06-25 MED ORDER — DOXYCYCLINE HYCLATE 100 MG PO TABS: 100.0000 mg | ORAL_TABLET | Freq: Two times a day (BID) | ORAL | 0 refills | Status: DC

## 2023-06-25 NOTE — Telephone Encounter (Signed)
Copied from CRM [id number]. Topic: General - Other >> Jun 25, 2023  2:10 PM Dondra Prader E wrote: Reason for CRM: Pt called following up on request from yesterday.  From Triage Nurse:   Patient states she continues to have a productive cough and nasal and chest congestion. Patient states symptoms have improved since starting the antibiotics but the chest congestion is keeping her up at night. Patient started antibiotics on 06/19/23 and has 3 pills left. Care advice given and patient is requesting additional recommendations from PCP on what should be done to get rid of the chest congestion. Advised I would forward message to PCP for additional recommendations at this time.  Summary: rx req / cough and congestion     The patient shares that are continuing to experience cough and congestion  The patient has had symptoms for roughly 2 weeks and shares that they are continuing to experience the discomfort  The patient was previously prescribed amoxicillin-clavulanate (AUGMENTIN) 875-125 MG tablet [[id number]] and would like to take abx again if possible  Please contact when available

## 2023-06-27 NOTE — Telephone Encounter (Signed)
Detailed message left on identifiable machine belonging to the patient. Advised to call with any questions or concerns.

## 2023-07-14 ENCOUNTER — Other Ambulatory Visit: Payer: Self-pay | Admitting: Internal Medicine

## 2023-07-14 DIAGNOSIS — M961 Postlaminectomy syndrome, not elsewhere classified: Secondary | ICD-10-CM | POA: Diagnosis not present

## 2023-07-14 DIAGNOSIS — M5416 Radiculopathy, lumbar region: Secondary | ICD-10-CM | POA: Diagnosis not present

## 2023-07-14 NOTE — Telephone Encounter (Signed)
Please contact pt for future appointment. Pt overdue for follow up.

## 2023-07-14 NOTE — Telephone Encounter (Signed)
Left patient voicemail to schedule appt.

## 2023-07-16 NOTE — Progress Notes (Signed)
Cardiology Clinic Note   Date: 07/18/2023 ID: Sarah Todd 1946/02/24, MRN [id number]  Primary Cardiologist:  Yvonne Kendall, MD  Patient Profile    Sarah Todd is a 77 y.o. female who presents to the clinic today for routine follow up.     Past medical history significant for: DOE/fatigue. LBBB. Echo 08/29/2021: EF 55 to 60%.  No RWMA.  Mild LVH.  Grade II DD.  Normal RV function.  Mild MR.  Aortic valve sclerosis without stenosis. Nuclear stress test 10/04/2021: No significant ischemia.  No EKG changes concerning for ischemia at peak stress toward recovery.  CT attenuation correction images with mild coronary calcification, aortic atherosclerosis.  Low risk scan. Coronary artery calcification. Hypertension. Hyperlipidemia. Lipid panel 11/21/2021: LDL 41, HDL 66, TG 88, total 125. GERD. Varicose veins. OSA. Not on CPAP.   In summary, patient was first evaluated by Dr. Okey Dupre on 07/18/2021 for fatigue and shortness of breath at the request of Dr. Beryle Flock.  Patient reported progressive fatigue and exertional dyspnea shortly after right hip surgery in April 2022.  She also reported feeling as though her heart was racing especially with lying down.  EKG demonstrated sinus tachycardia with LBBB (new since since late March).  Tracing at that time was notable for inferior and anterior Q waves and there was concern for heart failure/ischemic heart disease.  Patient was started on Toprol.  ED precautions provided.  Echo showed normal LV/RV function, Grade II DD.  Nuclear stress test was low risk study.  Mild coronary calcifications and aortic atherosclerosis was noted and rosuvastatin was increased.     History of Present Illness    Sarah Todd is followed by Dr. Okey Dupre for the above outlined history.  Patient was last seen in the office by Dr. Okey Dupre on 03/06/2022 for routine follow-up.  Aspirin was stopped by PCP secondary to GI discomfort.  She reported continued mild  fatigue with strenuous household activities.  She was instructed to restart aspirin and if abdominal discomfort recurred she could stop it.  No other medication changes were made.   Discussed the use of AI scribe software for clinical note transcription with the patient, who gave verbal consent to proceed.  The patient presents for a routine follow-up. She is doing well. She has had no further fatigue or DOE since recovering from hip surgery.  Patient denies shortness of breath, dyspnea on exertion, lower extremity edema, orthopnea or PND. No chest pain, pressure, or tightness. No palpitations.  She is [facility] She has no cardiac concerns today.        ROS: All other systems reviewed and are otherwise negative except as noted in History of Present Illness.  Studies Reviewed    EKG Interpretation Date/Time:  Friday July 18 2023 11:01:51 EST Ventricular Rate:  71 PR Interval:  192 QRS Duration:  112 QT Interval:  392 QTC Calculation: 425 R Axis:   -24  Text Interpretation: Normal sinus rhythm Minimal voltage criteria for LVH, may be normal variant ( Cornell product ) Inferior infarct (cited on or before 19-Jul-2019) Anterior infarct (cited on or before 01-Jan-2009) ST & T wave abnormality, consider lateral ischemia When compared with ECG of 28-Nov-2020 12:05, QRS duration has increased ST now depressed in Lateral leads Confirmed by Carlos Levering [phone]) on 07/18/2023 11:19:09 AM    Risk Assessment/Calculations        STOP-Bang Score:  Physical Exam    VS:  BP 126/76 (BP Location: Left Arm, Cuff Size: Normal)   Pulse 71   Ht 5\' 2"  (1.575 m)   Wt 145 lb (65.8 kg)   SpO2 95%   BMI 26.52 kg/m  , BMI Body mass index is 26.52 kg/m.  GEN: Well nourished, well developed, in no acute distress. Neck: No JVD or carotid bruits. Cardiac:  RRR. No murmurs. No rubs or gallops.   Respiratory:   Respirations regular and unlabored. Clear to auscultation without rales, wheezing or rhonchi. GI: Soft, nontender, nondistended. Extremities: Radials/DP/PT 2+ and equal bilaterally. No clubbing or cyanosis. No edema.  Skin: Warm and dry, no rash. Neuro: Strength intact.  Assessment & Plan       DOE/fatigue Nuclear stress test February 2023 with no significant ischemia or EKG changes seen.  Patient denies further DOE or fatigue since recovering from hip surgery. [facility].  -No further testing indicated.   LBBB Echo December 2022 showed normal LV/RV function, Grade II DD.  EKG today shows NSR with ST and T wave changes in lateral leads. No LBBB on EKG today. She denies chest pain, pressure or tightness. No shortness of breath or DOE. She is very active doing circuit classes and chair yoga twice a week.  -No further testing indicated at this time.  -Continue Toprol.  Hyperlipidemia/coronary artery calcification LDL March 2023 41, at goal. -Continue aspirin, rosuvastatin.   Hypertension BP today 126/76.  -Continue Toprol and losartan.          Disposition: Return in 1 year or sooner as needed.          Signed, Etta Grandchild. Zayquan Bogard, DNP, NP-C

## 2023-07-16 NOTE — Telephone Encounter (Signed)
Last office visit: 03/06/22 with plan to f/u in 12 months  Next office visit: 07/18/23

## 2023-07-18 ENCOUNTER — Encounter: Payer: Self-pay | Admitting: Student

## 2023-07-18 ENCOUNTER — Ambulatory Visit: Payer: PPO | Attending: Student | Admitting: Student

## 2023-07-18 VITALS — BP 126/76 | HR 71 | Ht 62.0 in | Wt 145.0 lb

## 2023-07-18 DIAGNOSIS — E7800 Pure hypercholesterolemia, unspecified: Secondary | ICD-10-CM

## 2023-07-18 DIAGNOSIS — I447 Left bundle-branch block, unspecified: Secondary | ICD-10-CM

## 2023-07-18 DIAGNOSIS — I10 Essential (primary) hypertension: Secondary | ICD-10-CM

## 2023-07-18 DIAGNOSIS — I2510 Atherosclerotic heart disease of native coronary artery without angina pectoris: Secondary | ICD-10-CM

## 2023-07-18 MED ORDER — LOSARTAN POTASSIUM 100 MG PO TABS: 100.0000 mg | ORAL_TABLET | Freq: Every day | ORAL | 3 refills | Status: DC

## 2023-07-18 NOTE — Patient Instructions (Signed)
Medication Instructions:  No changes *If you need a refill on your cardiac medications before your next appointment, please call your pharmacy*   Lab Work: None ordered If you have labs (blood work) drawn today and your tests are completely normal, you will receive your results only by: MyChart Message (if you have MyChart) OR A paper copy in the mail If you have any lab test that is abnormal or we need to change your treatment, we will call you to review the results.   Testing/Procedures: None ordered   Follow-Up: At [facility], you and your health needs are our priority.  As part of our continuing mission to provide you with exceptional heart care, we have created designated Provider Care Teams.  These Care Teams include your primary Cardiologist (physician) and Advanced Practice Providers (APPs -  Physician Assistants and Nurse Practitioners) who all work together to provide you with the care you need, when you need it.  We recommend signing up for the patient portal called "MyChart".  Sign up information is provided on this After Visit Summary.  MyChart is used to connect with patients for Virtual Visits (Telemedicine).  Patients are able to view lab/test results, encounter notes, upcoming appointments, etc.  Non-urgent messages can be sent to your provider as well.   To learn more about what you can do with MyChart, go to [email or web address].    Your next appointment:   12 month(s)  Provider:   You may see Yvonne Kendall, MD or one of the following Advanced Practice Providers on your designated Care Team:   Carlos Levering, NP

## 2023-07-24 ENCOUNTER — Other Ambulatory Visit: Payer: Self-pay | Admitting: Nurse Practitioner

## 2023-09-10 ENCOUNTER — Other Ambulatory Visit: Payer: Self-pay | Admitting: Internal Medicine

## 2023-11-07 ENCOUNTER — Ambulatory Visit: Payer: Self-pay | Admitting: Family Medicine

## 2023-11-07 NOTE — Telephone Encounter (Signed)
 Copied from CRM [id number]. Topic: Clinical - Medical Advice >> Nov 07, 2023  1:34 PM Patsy Lager T wrote: Reason for CRM: patient called stated she has a phone appt for her AWV but she has a head cold and sinus issues. She is asking for a medication that will clear up the cough and stuffy nose   Chief Complaint: Sinus congestion  Symptoms: Cough, sinus congestion, frontal headache, runny nose, sore throat  Frequency: Constant  Disposition: [] ED /[] Urgent Care (no appt availability in office) / [] Appointment(In office/virtual)/ []  [place] [facility]/ [x] Home Care/ [] Refused Recommended Disposition /[] [place] [facility]/ []  Follow-up with PCP Additional Notes: Patient reports she has been experiencing sinus congestion for the last 3 days. She states that with her congestion she has been experiencing a cough, frontal headache, runny nose, and sore throat. She has not had a recorded fever. Patient requesting medication for her symptoms. I advised that medication usually requires an appointment which she is agreeable with. Appointment scheduled Monday and patient would like medication sent to her pharmacy for the weekend if possible.    Reason for Disposition  [1] Sinus congestion as part of a cold AND [2] present < 10 days    Appointment scheduled due to request for medication  Answer Assessment - Initial Assessment Questions 1. LOCATION: "Where does it hurt?"      Frontal headache  2. ONSET: "When did the sinus pain start?"  (e.g., hours, days)      3 days ago  3. SEVERITY: "How bad is the pain?"   (Scale 1-10; mild, moderate or severe)   - MILD (1-3): doesn't interfere with normal activities    - MODERATE (4-7): interferes with normal activities (e.g., work or school) or awakens from sleep   - SEVERE (8-10): excruciating pain and patient unable to do any normal activities        6/10 4. RECURRENT SYMPTOM: "Have you ever had sinus problems before?" If Yes, ask: "When was the last time?" and  "What happened that time?"      Yes 5. NASAL CONGESTION: "Is the nose blocked?" If Yes, ask: "Can you open it or must you breathe through your mouth?"     No 6. NASAL DISCHARGE: "Do you have discharge from your nose?" If so ask, "What color?"     Clear 7. FEVER: "Do you have a fever?" If Yes, ask: "What is it, how was it measured, and when did it start?"      Has not checked 8. OTHER SYMPTOMS: "Do you have any other symptoms?" (e.g., sore throat, cough, earache, difficulty breathing)     Cough  Protocols used: Sinus Pain or Congestion-A-AH

## 2023-11-07 NOTE — Telephone Encounter (Signed)
 Wrong office. Please route to correct location

## 2023-11-07 NOTE — Telephone Encounter (Signed)
 Copied from CRM [id number]. Topic: Clinical - Medical Advice >> Nov 07, 2023  1:34 PM Patsy Lager T wrote: Reason for CRM: patient called stated she has a phone appt for her AWV but she has a head cold and sinus issues. She is asking for a medication that will clear up the cough and stuffy nose  Called pt - left message on machine requesting call back.

## 2023-11-10 ENCOUNTER — Ambulatory Visit (INDEPENDENT_AMBULATORY_CARE_PROVIDER_SITE_OTHER): Admitting: Family Medicine

## 2023-11-10 ENCOUNTER — Encounter: Payer: Self-pay | Admitting: Family Medicine

## 2023-11-10 VITALS — BP 139/78 | HR 83 | Ht 62.0 in | Wt 147.9 lb

## 2023-11-10 DIAGNOSIS — M62838 Other muscle spasm: Secondary | ICD-10-CM | POA: Diagnosis not present

## 2023-11-10 DIAGNOSIS — J0140 Acute pansinusitis, unspecified: Secondary | ICD-10-CM

## 2023-11-10 MED ORDER — AMOXICILLIN-POT CLAVULANATE 875-125 MG PO TABS: 1.0000 | ORAL_TABLET | Freq: Two times a day (BID) | ORAL | 0 refills | Status: AC

## 2023-11-10 NOTE — Progress Notes (Signed)
 Acute visit   Patient: Sarah Todd   DOB: 12/30/1945   78 y.o. Female  MRN: [id number] PCP: Erasmo Downer, MD   Chief Complaint  Patient presents with   Cough    Cough and sinus pressure, frontal headache, runny nose, sore throat X 6 days. Patient reports moderate pain (interferes with normal activities (e.g., work or school) or awakens from sleep). Reports having the symptoms the week before last and the it went away and came back. She reports taking mucinex and [facility] versions as well and symptoms will not go away. Patient reports no color noticed to mucus/phlegm.    Subjective    Discussed the use of AI scribe software for clinical note transcription with the patient, who gave verbal consent to proceed.  History of Present Illness   The patient, with a history of sinus issues, presents with recurrent sinus symptoms. She reports having similar symptoms a week prior, which resolved with over-the-counter medication. However, the symptoms returned a week later, characterized by headache, sinus pressure, and post-nasal drip leading to a cough. The patient has been taking Mucinex and Tylenol, which provide temporary relief but the symptoms persist. She also reports a new issue of left shoulder pain, particularly when sleeping on that side, which has led to changes in sleeping position and subsequent neck discomfort.        Review of Systems   Objective    BP 139/78 (BP Location: Left Arm, Patient Position: Sitting, Cuff Size: Normal)   Pulse 83   Ht 5\' 2"  (1.575 m)   Wt 147 lb 14.4 oz (67.1 kg)   SpO2 99%   BMI 27.05 kg/m    Physical Exam Vitals reviewed.  Constitutional:      General: She is not in acute distress.    Appearance: Normal appearance. She is well-developed. She is not diaphoretic.  HENT:     Head: Normocephalic and atraumatic.     Right Ear: Tympanic membrane, ear canal and external ear normal.     Left Ear: Tympanic membrane, ear canal and  external ear normal.     Nose: Congestion present.     Comments: Sinus ttp    Mouth/Throat:     Mouth: Mucous membranes are moist.     Pharynx: Oropharynx is clear. No oropharyngeal exudate.  Eyes:     General: No scleral icterus.    Conjunctiva/sclera: Conjunctivae normal.     Pupils: Pupils are equal, round, and reactive to light.  Cardiovascular:     Rate and Rhythm: Normal rate and regular rhythm.     Heart sounds: Normal heart sounds. No murmur heard. Pulmonary:     Effort: Pulmonary effort is normal. No respiratory distress.     Breath sounds: Normal breath sounds. No wheezing or rales.  Musculoskeletal:     Cervical back: Neck supple.     Right lower leg: No edema.     Left lower leg: No edema.  Lymphadenopathy:     Cervical: No cervical adenopathy.  Skin:    General: Skin is warm and dry.  Neurological:     Mental Status: She is alert.       No results found for any visits on 11/10/23.  Assessment & Plan     Problem List Items Addressed This Visit       Respiratory   Acute non-recurrent pansinusitis - Primary   Relevant Medications   amoxicillin-clavulanate (AUGMENTIN) 875-125 MG tablet   Other Visit Diagnoses  Muscle spasm              Bacterial Sinusitis Recurrent sinus symptoms with initial improvement followed by worsening, consistent with bacterial sinusitis. Symptoms include sinus congestion, headache, and drainage into the throat. Examination reveals nasal turbinate swelling and sinus tenderness. Over-the-counter medications have provided partial relief but symptoms persist. Augmentin is chosen for its effectiveness in treating bacterial sinus infections. Potential side effects include stomach upset, which can be mitigated by taking the medication with food and consuming yogurt to maintain gut health. - Prescribe Augmentin 875 mg twice daily for 7 days - Advise use of nasal saline spray for sinus drainage - Instruct to continue  over-the-counter sinus medications if helpful - Advise to take antibiotics with food to prevent stomach upset - Recommend yogurt to maintain gut health during antibiotic treatment - Instruct to complete full course of antibiotics - Advise to contact the clinic if symptoms do not improve  Muscle Tightness in Left Shoulder Muscle tightness and discomfort in the left shoulder blade area, likely due to tension and sleeping position. Examination reveals a knot in the muscle, typical for muscle tightness. Heat and massage are recommended as effective non-pharmacological treatments to alleviate muscle tension. - Recommend massage and use of a heating pad for relief - Advise husband to assist with massage if possible       Meds ordered this encounter  Medications   amoxicillin-clavulanate (AUGMENTIN) 875-125 MG tablet    Sig: Take 1 tablet by mouth 2 (two) times daily for 7 days.    Dispense:  14 tablet    Refill:  0     Return in about 3 months (around 02/10/2024) for CPE.      Shirlee Latch, MD  [facility] [phone] (phone) [phone] (fax)  [facility]

## 2023-11-12 ENCOUNTER — Ambulatory Visit (INDEPENDENT_AMBULATORY_CARE_PROVIDER_SITE_OTHER): Payer: PPO | Admitting: Emergency Medicine

## 2023-11-12 VITALS — Ht 62.0 in | Wt 147.0 lb

## 2023-11-12 DIAGNOSIS — Z0000 Encounter for general adult medical examination without abnormal findings: Secondary | ICD-10-CM

## 2023-11-12 NOTE — Patient Instructions (Addendum)
 Sarah Todd , Thank you for taking time to come for your Medicare Wellness Visit. I appreciate your ongoing commitment to your health goals. Please review the following plan we discussed and let me know if I can assist you in the future.   Referrals/Orders/Follow-Ups/Clinician Recommendations: Keep up the good work!!  This is a list of the screening recommended for you and due dates:  Health Maintenance  Topic Date Due   COVID-19 Vaccine (7 - Moderna risk 2024-25 season) 02/03/2024   Mammogram  04/20/2024   Medicare Annual Wellness Visit  11/11/2024   Colon Cancer Screening  01/24/2025   DTaP/Tdap/Td vaccine (2 - Td or Tdap) 10/16/2026   DEXA scan (bone density measurement)  12/19/2027   Pneumonia Vaccine  Completed   Flu Shot  Completed   Hepatitis C Screening  Completed   Zoster (Shingles) Vaccine  Completed   HPV Vaccine  Aged Out    Advanced directives: (In Chart) A copy of your advanced directives are scanned into your chart should your provider ever need it.  Next Medicare Annual Wellness Visit scheduled for next year: Yes, 11/17/24 @ 1:50pm (phone visit)

## 2023-11-12 NOTE — Progress Notes (Signed)
 Subjective:   Sarah Todd is a 78 y.o. who presents for a Medicare Wellness preventive visit.  Visit Complete: Virtual I connected with  Sarah Todd on 11/12/23 by a audio enabled telemedicine application and verified that I am speaking with the correct person using two identifiers.  Patient Location: Home  Provider Location: Home Office  I discussed the limitations of evaluation and management by telemedicine. The patient expressed understanding and agreed to proceed.  Vital Signs: Because this visit was a virtual/telehealth visit, some criteria may be missing or patient reported. Any vitals not documented were not able to be obtained and vitals that have been documented are patient reported.  VideoDeclined- This patient declined Librarian, academic. Therefore the visit was completed with audio only.  AWV Questionnaire: Yes: Patient Medicare AWV questionnaire was completed by the patient on 11/11/23; I have confirmed that all information answered by patient is correct and no changes since this date.  Cardiac Risk Factors include: advanced age (>56men, >31 women);hypertension;dyslipidemia;Other (see comment), Risk factor comments: OSA (no cpap)     Objective:    Today's Vitals   11/12/23 1352  Weight: 147 lb (66.7 kg)  Height: 5\' 2"  (1.575 m)   Body mass index is 26.89 kg/m.     11/12/2023    2:09 PM 10/28/2022    2:23 PM 10/24/2021    3:03 PM 12/06/2020   12:47 PM 12/06/2020    7:34 AM 11/28/2020   11:01 AM 02/29/2020    1:34 PM  Advanced Directives  Does Patient Have a Medical Advance Directive? Yes Yes Yes Yes Yes Yes Yes  Type of Estate agent of [place];Living will  Living will Living will Living will Living will Healthcare Power of Attorney;Living will  Does patient want to make changes to medical advance directive? No - Patient declined  Yes (Inpatient - patient defers changing a medical advance directive and  declines information at this time) No - Patient declined No - Patient declined No - Patient declined   Copy of Healthcare Power of Attorney in Chart? Yes - validated most recent copy scanned in chart (See row information)      Yes - validated most recent copy scanned in chart (See row information)  Would patient like information on creating a medical advance directive?    No - Patient declined No - Patient declined      Current Medications (verified) Outpatient Encounter Medications as of 11/12/2023  Medication Sig   acetaminophen (TYLENOL) 650 MG CR tablet Take 2,600 mg by mouth daily as needed for pain.   amoxicillin-clavulanate (AUGMENTIN) 875-125 MG tablet Take 1 tablet by mouth 2 (two) times daily for 7 days.   aspirin EC 81 MG tablet Take 1 tablet (81 mg total) by mouth daily. Swallow whole.   Coenzyme Q10 (COQ-10) 100 MG capsule Take 1 capsule (100 mg total) by mouth daily.   cycloSPORINE (RESTASIS) 0.05 % ophthalmic emulsion Place 1 drop into both eyes 2 (two) times daily.   estradiol (ESTRACE) 0.5 MG tablet TAKE ONE TABLET EVERY DAY   gabapentin (NEURONTIN) 600 MG tablet Take 600-900 mg by mouth See admin instructions. Take 900 mg by mouth in the morning and 900 mg at night   hydrocortisone 2.5 % cream Apply 1 Application topically 2 (two) times daily as needed (irritation).   losartan (COZAAR) 100 MG tablet Take 1 tablet (100 mg total) by mouth daily.   metoprolol succinate (TOPROL-XL) 25 MG 24 hr tablet  TAKE 1 TABLET BY MOUTH DAILY   Multiple Vitamin (MULTIVITAMIN) capsule Take 1 capsule by mouth daily.    Omega-3 Fatty Acids (FISH OIL) 1000 MG CAPS Take 1,000-2,000 mg by mouth See admin instructions. Take 1000 mg in the morning and 2000 mg at night   Probiotic Product (PROBIOTIC PO) Take 1 capsule by mouth daily.   rosuvastatin (CRESTOR) 20 MG tablet Take 1 tablet (20 mg total) by mouth daily.   triamcinolone cream (KENALOG) 0.1 % Apply 1 application topically 2 (two) times daily as  needed.   White Petrolatum-Mineral Oil (GENTEAL TEARS NIGHT-TIME) OINT Apply to eye.   No facility-administered encounter medications on file as of 11/12/2023.    Allergies (verified) Lisinopril, Shrimp [shellfish allergy], and Aspercreme [trolamine salicylate]   History: Past Medical History:  Diagnosis Date   Allergy    Arthritis    hands   Diastolic dysfunction    a. 08/2021 Echo: EF 55-60%, no rwma. GrII DD. Nl RV fxn. Mild MR. Ao sclerosis w/o stenosis.   Family history of adverse reaction to anesthesia    sister but not sure of what happened   GERD (gastroesophageal reflux disease)    History of cardiovascular stress test    a. 10/2021 MV: EF 88%. No ischemia/infarct. CT w/ mild Cor Ca2+ and Ao atherosclerosis.  Low risk scan.   Hyperlipidemia    Hypertension    Osteoporosis    Varicose veins    Mild   Past Surgical History:  Procedure Laterality Date   ABDOMINAL HYSTERECTOMY     ANTERIOR CERVICAL DECOMP/DISCECTOMY FUSION N/A 07/21/2019   Procedure: ANTERIOR CERVICAL DECOMPRESSION/DISCECTOMY FUSION CERVICAL THREE- CERVICAL FOUR, CERVICAL FOUR- CERVICAL FIVE, CERVICAL FIVE- CERVICAL SIX;  Surgeon: Shirlean Kelly, MD;  Location: [facility] OR;  Service: Neurosurgery;  Laterality: N/A;  ANTERIOR CERVICAL DECOMPRESSION/DISCECTOMY FUSION CERVICAL 3- CERVICAL 4, CERVICAL 4- CERVICAL 5, CERVICAL 5- CERVICAL 6   BACK SURGERY     cervical and lumbar     COLONOSCOPY N/A 01/05/2015   Procedure: COLONOSCOPY;  Surgeon: Midge Minium, MD;  [facility];  Service: Gastroenterology;  Laterality: N/A;   COLONOSCOPY WITH PROPOFOL N/A 01/25/2020   Procedure: COLONOSCOPY WITH PROPOFOL;  Surgeon: Midge Minium, MD;  Location: [facility] END[facility]COPY;  Service: Endoscopy;  Laterality: N/A;   DILATION AND CURETTAGE OF UTERUS     FRACTURE SURGERY     clavicle right   JOINT REPLACEMENT  12/07/2019   SPINE SURGERY  07/2019   TOOTH EXTRACTION     TOTAL HIP ARTHROPLASTY Right 12/06/2020    Procedure: TOTAL HIP ARTHROPLASTY ANTERIOR APPROACH;  Surgeon: Lyndle Herrlich, MD;  Location: [facility] ORS;  Service: Orthopedics;  Laterality: Right;   TUBAL LIGATION     Family History  Problem Relation Age of Onset   Cancer Mother        colon cancer   Arthritis Mother    Varicose Veins Mother    Alzheimer's disease Father    Heart disease Father    Hypertension Father    Cancer Sister        uterine   Breast cancer Neg Hx    Social History   Socioeconomic History   Marital status: Married    Spouse name: Zykera Abella   Number of children: 3   Years of education: 15   Highest education level: Some college, no degree  Occupational History   Occupation: retired  Tobacco Use   Smoking status: Never   Smokeless tobacco: Never  Advertising account planner  Vaping status: Never Used  Substance and Sexual Activity   Alcohol use: No   Drug use: No   Sexual activity: Yes    Birth control/protection: None  Other Topics Concern   Not on file  Social History Narrative   Not on file   Social Drivers of Health   Financial Resource Strain: Low Risk  (11/12/2023)   Overall Financial Resource Strain (CARDIA)    Difficulty of Paying Living Expenses: Not hard at all  Food Insecurity: No Food Insecurity (11/12/2023)   Hunger Vital Sign    Worried About Running Out of Food in the Last Year: Never true    Ran Out of Food in the Last Year: Never true  Transportation Needs: No Transportation Needs (11/12/2023)   PRAPARE - Administrator, Civil Service (Medical): No    Lack of Transportation (Non-Medical): No  Physical Activity: Sufficiently Active (11/12/2023)   Exercise Vital Sign    Days of Exercise per Week: 2 days    Minutes of Exercise per Session: 120 min  Stress: No Stress Concern Present (11/12/2023)   [employer] of Occupational Health - Occupational Stress Questionnaire    Feeling of Stress : Only a little  Social Connections: Socially Integrated (11/12/2023)    Social Connection and Isolation Panel [NHANES]    Frequency of Communication with Friends and Family: More than three times a week    Frequency of Social Gatherings with Friends and Family: More than three times a week    Attends Religious Services: More than 4 times per year    Active Member of [employer] or Organizations: Yes    Attends Engineer, structural: More than 4 times per year    Marital Status: Married    Tobacco Counseling Counseling given: Not Answered    Clinical Intake:  Pre-visit preparation completed: Yes  Pain : No/denies pain     BMI - recorded: 26.89 Nutritional Status: BMI 25 -29 Overweight Nutritional Risks: None Diabetes: No  How often do you need to have someone help you when you read instructions, pamphlets, or other written materials from your doctor or pharmacy?: 1 - Never  Interpreter Needed?: No  Information entered by :: Tora Kindred, CMA   Activities of Daily Living     11/12/2023    1:58 PM 11/11/2023   12:48 PM  In your present state of health, do you have any difficulty performing the following activities:  Hearing? 0 0  Vision? 1 1  Comment dry eyes   Difficulty concentrating or making decisions? 0 1  Walking or climbing stairs? 0 0  Dressing or bathing? 0 0  Doing errands, shopping? 0 0  Preparing Food and eating ? N N  Using the Toilet? N N  In the past six months, have you accidently leaked urine? Malvin Johns  Comment wears depends when she goes out and a pad a night   Do you have problems with loss of bowel control? N N  Managing your Medications? N N  Managing your Finances? N N  Housekeeping or managing your Housekeeping? N N    Patient Care Team: Erasmo Downer, MD as PCP - General (Family Medicine) End, Cristal Deer, MD as PCP - Cardiology (Cardiology) Eli Phillips, OD as Consulting Physician (Optometry) Linus Salmons, MD (Otolaryngology) Donalee Citrin, MD as Consulting Physician (Neurosurgery) Midge Minium,  MD as Consulting Physician (Gastroenterology) Juanell Fairly, MD as Referring Physician (Orthopedic Surgery) Lonell Face, MD as Consulting Physician (Neurology) Galen Manila,  MD as Referring Physician (Ophthalmology)  Indicate any recent Medical Services you may have received from other than [facility] providers in the past year (date may be approximate).     Assessment:   This is a routine wellness examination for Sarah Todd.  Hearing/Vision screen Hearing Screening - Comments:: Denies hearing loss Vision Screening - Comments:: Gets eye exams, Dr. Galen Manila [facility]   Goals Addressed             This Visit's Progress    Patient Stated       Maintain current health and activity level       Depression Screen     11/12/2023    2:06 PM 11/10/2023    2:05 PM 06/19/2023    1:09 PM 01/02/2023    3:19 PM 12/10/2022    1:21 PM 10/28/2022    2:13 PM 06/06/2022    1:15 PM  PHQ 2/9 Scores  PHQ - 2 Score 0 0 2 0 0 0 0  PHQ- 9 Score 0  3 2 3  6     Fall Risk     11/12/2023    2:10 PM 11/11/2023   12:48 PM 11/10/2023    2:06 PM 06/19/2023    1:09 PM 01/02/2023    3:18 PM  Fall Risk   Falls in the past year? 0 0 0 0 0  Number falls in past yr: 0  0 0 0  Injury with Fall? 0  0 0 0  Risk for fall due to : No Fall Risks  No Fall Risks No Fall Risks No Fall Risks  Follow up Falls prevention discussed;Falls evaluation completed  Falls evaluation completed Falls evaluation completed Falls evaluation completed    MEDICARE RISK AT HOME:  Medicare Risk at Home Any stairs in or around the home?: Yes If so, are there any without handrails?: No Home free of loose throw rugs in walkways, pet beds, electrical cords, etc?: Yes Adequate lighting in your home to reduce risk of falls?: Yes Life alert?: No Use of a cane, walker or w/c?: No Grab bars in the bathroom?: No Shower chair or bench in shower?: No Elevated toilet seat or a handicapped toilet?: No  TIMED UP AND GO:  Was  the test performed?  No  Cognitive Function: 6CIT completed        11/12/2023    2:11 PM 10/28/2022    2:24 PM 02/29/2020    1:39 PM 12/20/2019    1:58 PM 02/23/2019    2:53 PM  6CIT Screen  What Year? 0 points 0 points 0 points 0 points 0 points  What month? 0 points 0 points 0 points 0 points 0 points  What time? 0 points 0 points 0 points 0 points 0 points  Count back from 20 0 points 0 points 0 points 0 points 0 points  Months in reverse 2 points 0 points 0 points 0 points 0 points  Repeat phrase 0 points 2 points 0 points 6 points 0 points  Total Score 2 points 2 points 0 points 6 points 0 points    Immunizations Immunization History  Administered Date(s) Administered   Fluad Quad(high Dose 65+) 06/06/2022   Influenza,inj,Quad PF,6+ Mos 06/15/2015   Influenza-Unspecified 05/13/2017, 06/09/2018, 05/18/2019, 07/28/2020, 07/27/2021   Moderna Covid-19 Fall Seasonal Vaccine 45yrs & older 08/05/2023   Moderna Covid-19 Vaccine Bivalent Booster 32yrs & up 03/01/2021, 07/27/2021   Moderna Sars-Covid-2 Vaccination 10/30/2019, 11/27/2019, 08/11/2020   Pneumococcal Conjugate-13 11/17/2014   Pneumococcal  Polysaccharide-23 08/03/2013   Respiratory Syncytial Virus Vaccine,Recomb Aduvanted(Arexvy) 07/16/2022   Tdap 10/16/2016   Zoster Recombinant(Shingrix) 09/09/2018, 03/09/2019    Screening Tests Health Maintenance  Topic Date Due   INFLUENZA VACCINE  12/01/2023 (Originally 04/03/2023)   COVID-19 Vaccine (7 - Moderna risk 2024-25 season) 02/03/2024   MAMMOGRAM  04/20/2024   Medicare Annual Wellness (AWV)  11/11/2024   Colonoscopy  01/24/2025   DTaP/Tdap/Td (2 - Td or Tdap) 10/16/2026   Pneumonia Vaccine 59+ Years old  Completed   DEXA SCAN  Completed   Hepatitis C Screening  Completed   Zoster Vaccines- Shingrix  Completed   HPV VACCINES  Aged Out    Health Maintenance  There are no preventive care reminders to display for this patient. Health Maintenance Items Addressed: See  Nurse Notes  Additional Screening:  Vision Screening: Recommended annual ophthalmology exams for early detection of glaucoma and other disorders of the eye.  Dental Screening: Recommended annual dental exams for proper oral hygiene  Community Resource Referral / Chronic Care Management: CRR required this visit?  No   CCM required this visit?  No     Plan:     I have personally reviewed and noted the following in the patient's chart:   Medical and social history Use of alcohol, tobacco or illicit drugs  Current medications and supplements including opioid prescriptions. Patient is not currently taking opioid prescriptions. Functional ability and status Nutritional status Physical activity Advanced directives List of other physicians Hospitalizations, surgeries, and ER visits in previous 12 months Vitals Screenings to include cognitive, depression, and falls Referrals and appointments  In addition, I have reviewed and discussed with patient certain preventive protocols, quality metrics, and best practice recommendations. A written personalized care plan for preventive services as well as general preventive health recommendations were provided to patient.     Tora Kindred, CMA   11/12/2023   After Visit Summary: (MyChart) Due to this being a telephonic visit, the after visit summary with patients personalized plan was offered to patient via MyChart   Notes:  6 CIT - 2

## 2023-11-17 ENCOUNTER — Telehealth: Payer: Self-pay | Admitting: Family Medicine

## 2023-11-17 NOTE — Telephone Encounter (Signed)
 Copied from CRM [id number]. Topic: Clinical - Medical Advice >> Nov 17, 2023 11:00 AM Hillary B wrote: Reason for CRM: The patient would like a call back to review some general concerns with appointments she has had recenttly. She states that she did not feel her sleep test was accurate because it was done at home and she woke up many times to use the restroom. She was referred to a heart doctor and a pulmonary doctor, which her surgeon did not agree she should have went there. End result was the patient receiving a cpap machine that she returned because she did not feel she needed it and it did not help her sleep. She wants to make sure that what took place will not effect her eligibility with her medicare. I did suggest to her to contact her medicare plan directly as well as reviewing these concerns with her PCP office. She can be reached at [phone].

## 2023-11-19 NOTE — Telephone Encounter (Signed)
 Pt advised provider will be out of office and will address her concerns upon returning to the office she verbalized understanding and okay with waiting.

## 2023-11-22 ENCOUNTER — Other Ambulatory Visit: Payer: Self-pay | Admitting: Family Medicine

## 2023-11-24 NOTE — Telephone Encounter (Signed)
 Requested Prescriptions  Refused Prescriptions Disp Refills   estradiol (ESTRACE) 0.5 MG tablet [Pharmacy Med Name: ESTRADIOL 0.5 MG TAB] 90 tablet 2    Sig: TAKE ONE TABLET EVERY DAY     OB/GYN:  Estrogens Passed - 11/24/2023  2:35 PM      Passed - Mammogram is up-to-date per Health Maintenance      Passed - Last BP in normal range    BP Readings from Last 1 Encounters:  11/10/23 139/78         Passed - Valid encounter within last 12 months    Recent Outpatient Visits           5 months ago Acute non-recurrent frontal sinusitis   [facility] [place], Marzella Schlein, MD   10 months ago Rhinosinusitis   [facility] Alfredia Ferguson, PA-C   11 months ago Acute non-recurrent pansinusitis   [facility] Jacky Kindle, FNP   1 year ago Essential hypertension   [place] [facility] [place], Marzella Schlein, MD   1 year ago Pain of upper abdomen   [facility] [place], [place], PA-C       Future Appointments             In 2 months [place], Marzella Schlein, MD [facility], [facility]

## 2023-11-26 DIAGNOSIS — H43813 Vitreous degeneration, bilateral: Secondary | ICD-10-CM | POA: Diagnosis not present

## 2023-11-26 DIAGNOSIS — H2513 Age-related nuclear cataract, bilateral: Secondary | ICD-10-CM | POA: Diagnosis not present

## 2023-12-29 ENCOUNTER — Encounter: Payer: Self-pay | Admitting: Family Medicine

## 2023-12-29 ENCOUNTER — Ambulatory Visit: Admitting: Family Medicine

## 2023-12-29 ENCOUNTER — Ambulatory Visit: Payer: Self-pay

## 2023-12-29 VITALS — BP 114/67 | HR 82 | Temp 97.4°F | Resp 16 | Wt 148.0 lb

## 2023-12-29 DIAGNOSIS — J329 Chronic sinusitis, unspecified: Secondary | ICD-10-CM | POA: Diagnosis not present

## 2023-12-29 DIAGNOSIS — J3089 Other allergic rhinitis: Secondary | ICD-10-CM | POA: Diagnosis not present

## 2023-12-29 MED ORDER — AMOXICILLIN-POT CLAVULANATE 875-125 MG PO TABS
1.0000 | ORAL_TABLET | Freq: Two times a day (BID) | ORAL | 0 refills | Status: AC
Start: 2023-12-29 — End: 2024-01-05

## 2023-12-29 NOTE — Telephone Encounter (Signed)
  Chief Complaint: Cough-Productive  Symptoms: Cough, Hoarseness, Congestion, Headache  Frequency: Started Friday  Pertinent Negatives: Patient denies chest pain, shortness of breath,  Disposition: [] ED /[] Urgent Care (no appt availability in office) / [x] Appointment(In office/virtual)/ []  [place] Virtual [facility]/ [] Home Care/ [] Refused Recommended Disposition /[] [place] [facility]/ []  Follow-up with PCP  Additional Notes: MB is being triaged for what she feels like is a head cold. The patient is requesting an antibiotic be called in if possible or to be seen in office today if possible. The patient is without chest pain, dizziness, or dyspnea. Describes sputum as a pale yellow. In office appointment made for today.   Reason for Disposition  [1] Continuous (nonstop) coughing interferes with work or school AND [2] no improvement using cough treatment per Care Advice  Answer Assessment - Initial Assessment Questions 1. ONSET: "When did the cough begin?"      Since Friday  2. SEVERITY: "How bad is the cough today?"      Comes and goes  3. SPUTUM: "Describe the color of your sputum" (none, dry cough; clear, white, yellow, green)     Pale Yellow  4. HEMOPTYSIS: "Are you coughing up any blood?" If so ask: "How much?" (flecks, streaks, tablespoons, etc.)     None  5. DIFFICULTY BREATHING: "Are you having difficulty breathing?" If Yes, ask: "How bad is it?" (e.g., mild, moderate, severe)    - MILD: No SOB at rest, mild SOB with walking, speaks normally in sentences, can lie down, no retractions, pulse < 100.    - MODERATE: SOB at rest, SOB with minimal exertion and prefers to sit, cannot lie down flat, speaks in phrases, mild retractions, audible wheezing, pulse 100-120.    - SEVERE: Very SOB at rest, speaks in single words, struggling to breathe, sitting hunched forward, retractions, pulse > 120      None  6. FEVER: "Do you have a fever?" If Yes, ask: "What is your temperature, how was  it measured, and when did it start?"     No  7. CARDIAC HISTORY: "Do you have any history of heart disease?" (e.g., heart attack, congestive heart failure)      No  8. LUNG HISTORY: "Do you have any history of lung disease?"  (e.g., pulmonary embolus, asthma, emphysema)     No  9. PE RISK FACTORS: "Do you have a history of blood clots?" (or: recent major surgery, recent prolonged travel, bedridden)     No  10. OTHER SYMPTOMS: "Do you have any other symptoms?" (e.g., runny nose, wheezing, chest pain)       Headache, Congestion, Hoarseness  11. PREGNANCY: "Is there any chance you are pregnant?" "When was your last menstrual period?"       No and No  12. TRAVEL: "Have you traveled out of the country in the last month?" (e.g., travel history, exposures)       No  Protocols used: Cough - Acute Productive-A-AH

## 2023-12-29 NOTE — Progress Notes (Signed)
 Established patient visit   Patient: Sarah Todd   DOB: [birth year]   78 y.o. Female  MRN: [id number] Visit Date: 12/29/2023  Today's healthcare provider: Jeralene Mom, MD   Chief Complaint  Patient presents with   URI    Symptoms: cough, congestion,sinus pressure, wheezing Frequency: x Friday Tried: Allergy, Mucinex-cough and sore throat. Flonase saline nasal spray   Subjective    Discussed the use of AI scribe software for clinical note transcription with the patient, who gave verbal consent to proceed.  History of Present Illness   Sarah Faudree "Malachi Screws" is an 78 year old female who presents with congestion, cough, and wheezing.  She has been experiencing significant congestion, coughing, and wheezing for approximately three days, initially attributing these symptoms to pollen allergies. She describes a 'click, click' or whistling sound when breathing at night.  She has been using allergy medications prescribed by an ENT, including allergy pills, saline nasal spray, and Flonase, but these have not provided significant relief. Her husband purchased Mucinex for cough and sore throat, which seems to help break up the congestion.  No fever, chills, or sweats. Nasal discharge is faint yellow, but not severe. She recalls a similar episode about six weeks ago, which started in the same manner.  She has a history of sinus infections, with the most recent one occurring a couple of weeks ago. She was previously prescribed Augmentin , which was effective. She has also tried Flonase and nasal chloroquine, but they did not seem effective, possibly due to being old.       Medications: Outpatient Medications Prior to Visit  Medication Sig   acetaminophen  (TYLENOL ) 650 MG CR tablet Take 2,600 mg by mouth daily as needed for pain.   aspirin  EC 81 MG tablet Take 1 tablet (81 mg total) by mouth daily. Swallow whole.   Coenzyme Q10 (COQ-10) 100 MG capsule Take 1 capsule (100  mg total) by mouth daily.   cycloSPORINE (RESTASIS) 0.05 % ophthalmic emulsion Place 1 drop into both eyes 2 (two) times daily.   estradiol  (ESTRACE ) 0.5 MG tablet TAKE ONE TABLET EVERY DAY   FIBER PO Take by mouth.   gabapentin  (NEURONTIN ) 600 MG tablet Take 600-900 mg by mouth See admin instructions. Take 900 mg by mouth in the morning and 900 mg at night   hydrocortisone  2.5 % cream Apply 1 Application topically 2 (two) times daily as needed (irritation).   losartan  (COZAAR ) 100 MG tablet Take 1 tablet (100 mg total) by mouth daily.   metoprolol  succinate (TOPROL -XL) 25 MG 24 hr tablet TAKE 1 TABLET BY MOUTH DAILY   Multiple Vitamin (MULTIVITAMIN) capsule Take 1 capsule by mouth daily.    Omega-3 Fatty Acids (FISH OIL) 1000 MG CAPS Take 1,000-2,000 mg by mouth See admin instructions. Take 1000 mg in the morning and 2000 mg at night   Probiotic Product (PROBIOTIC PO) Take 1 capsule by mouth daily.   rosuvastatin  (CRESTOR ) 20 MG tablet Take 1 tablet (20 mg total) by mouth daily.   triamcinolone  cream (KENALOG ) 0.1 % Apply 1 application topically 2 (two) times daily as needed.   White Petrolatum-Mineral Oil (GENTEAL TEARS NIGHT-TIME) OINT Apply to eye.   No facility-administered medications prior to visit.   Review of Systems     Objective    BP 114/67 (BP Location: Left Arm, Patient Position: Sitting, Cuff Size: Normal)   Pulse 82   Temp (!) 97.4 F (36.3 C) (Oral)   Resp 16  Wt 148 lb (67.1 kg)   SpO2 96%   BMI 27.07 kg/m   Physical Exam   General Appearance:    Well developed, well nourished female, alert, cooperative, in no acute distress  HENT:   bilateral TM normal without fluid or infection, neck without nodes, throat normal without erythema or exudate, frontal sinuses tender, and nasal mucosa pale and congested  Eyes:    PERRL, conjunctiva/corneas clear, EOM's intact       Lungs:     Clear to auscultation bilaterally, respirations unlabored  Heart:    Normal heart rate.  Normal rhythm. No murmurs, rubs, or gallops.    Neurologic:   Awake, alert, oriented x 3. No apparent focal neurological           defect.       Assessment & Plan       Acute sinusitis Acute sinusitis with congestion, cough, and faint yellow sputum, likely exacerbated by high pollen count. Previous CT scan ruled out chronic sinus issues. Prior infection responded to Augmentin . - Prescribed Augmentin . - Continue allergy medications.  Allergic rhinitis Chronic allergic rhinitis exacerbated by pollen. Previous treatment with Flonase and saline nasal spray was ineffective. Blood tests indicated multiple allergies. She declined allergy shots. - Continue allergy medications, including nasal sprays. - Consider updating allergy medications if outdated.         Jeralene Mom, MD  [facility] [phone] (phone) [phone] (fax)  [facility]

## 2023-12-29 NOTE — Telephone Encounter (Signed)
 Seen in office by Dr. Shann Darnel

## 2024-01-05 ENCOUNTER — Other Ambulatory Visit: Payer: Self-pay | Admitting: Family Medicine

## 2024-01-05 DIAGNOSIS — J329 Chronic sinusitis, unspecified: Secondary | ICD-10-CM

## 2024-01-06 ENCOUNTER — Ambulatory Visit: Payer: Self-pay

## 2024-01-06 ENCOUNTER — Other Ambulatory Visit: Payer: Self-pay

## 2024-01-06 DIAGNOSIS — J3089 Other allergic rhinitis: Secondary | ICD-10-CM

## 2024-01-06 MED ORDER — DOXYCYCLINE HYCLATE 100 MG PO TABS
100.0000 mg | ORAL_TABLET | Freq: Two times a day (BID) | ORAL | 0 refills | Status: DC
Start: 2024-01-06 — End: 2024-01-06

## 2024-01-06 MED ORDER — DOXYCYCLINE HYCLATE 100 MG PO TABS
100.0000 mg | ORAL_TABLET | Freq: Two times a day (BID) | ORAL | 0 refills | Status: DC
Start: 2024-01-06 — End: 2024-02-10

## 2024-01-06 NOTE — Telephone Encounter (Signed)
 VM left. Ok for E2C2 to advise if call is returned

## 2024-01-06 NOTE — Telephone Encounter (Signed)
  Chief Complaint: Patient states she is still having symptoms of the sinus infection-and would like another Rx for antibiotic. Symptoms: cough, congestion, sinus pressure, diarrhea Frequency: OV 12/29/23 Pertinent Negatives: Patient denies fever Disposition: [] ED /[] Urgent Care (no appt availability in office) / [] Appointment(In office/virtual)/ []  [facility]/ [] Home Care/ [x] Refused Recommended Disposition /[] [facility]/ []  Follow-up with PCP Additional Notes: Patient declines another appointment- she believes she needs another Rx of antiobiotic. Did discuss diarrhea she is having and hydration, electrolytes and fatigue. Patient declines appointment - unless PCP states she has to comes in- she is just requested extended treatment.

## 2024-01-06 NOTE — Addendum Note (Signed)
 Addended by: D[place] End on: 01/06/2024 04:40 PM   Modules accepted: Orders

## 2024-01-06 NOTE — Telephone Encounter (Signed)
  1st attempt, called and LVM for patient to return call for nurse triage.  Copied From CRM [id number]. Reason for Triage: persistent cough and diarrhea over a week Patient called to follow up on the existing amoxicillin  refill request, she reports  persistent cough and diarrhea over a week request is pending provider authorization. Please assess if further clinical evaluation is or expedited  review is needed. Patient callback [phone]

## 2024-01-06 NOTE — Telephone Encounter (Signed)
 Pt aware of PCP's recommendations, verbalized understanding and agrees to plan.

## 2024-01-06 NOTE — Telephone Encounter (Signed)
 Ok to switch to doxycycline  100mg  bid x7d #14 r0

## 2024-01-06 NOTE — Telephone Encounter (Signed)
 Reason for Disposition . [1] Taking antibiotic > 7 days AND [2] nasal discharge not improved  Answer Assessment - Initial Assessment Questions 1. ANTIBIOTIC: "What antibiotic are you taking?" "How many times a day?"    Amoxicillin -Pot Clavulanate 875-125 MG 1 tablet Oral 2 times daily 2. ONSET: "When was the antibiotic started?"     12/29/23 3. PAIN: "How bad is the sinus pain?"   (Scale 1-10; mild, moderate or severe)   - MILD (1-3): doesn't interfere with normal activities    - MODERATE (4-7): interferes with normal activities (e.g., work or school) or awakens from sleep   - SEVERE (8-10): excruciating pain and patient unable to do any normal activities        Pressure-mild/moderate 4. FEVER: "Do you have a fever?" If Yes, ask: "What is it, how was it measured, and when did it start?"      Not checked 5. SYMPTOMS: "Are there any other symptoms you're concerned about?" If Yes, ask: "When did it start?"     Patient is still having cough, wheezing with sleep, drainage, aches and pains  Protocols used: Sinus Infection on Antibiotic Follow-up Call-A-AH

## 2024-01-12 DIAGNOSIS — M5416 Radiculopathy, lumbar region: Secondary | ICD-10-CM | POA: Diagnosis not present

## 2024-01-12 DIAGNOSIS — M961 Postlaminectomy syndrome, not elsewhere classified: Secondary | ICD-10-CM | POA: Diagnosis not present

## 2024-02-10 ENCOUNTER — Encounter: Payer: Self-pay | Admitting: Family Medicine

## 2024-02-10 ENCOUNTER — Ambulatory Visit (INDEPENDENT_AMBULATORY_CARE_PROVIDER_SITE_OTHER): Admitting: Family Medicine

## 2024-02-10 VITALS — BP 125/74 | HR 73 | Ht 62.0 in | Wt 147.5 lb

## 2024-02-10 DIAGNOSIS — G4733 Obstructive sleep apnea (adult) (pediatric): Secondary | ICD-10-CM

## 2024-02-10 DIAGNOSIS — Z1231 Encounter for screening mammogram for malignant neoplasm of breast: Secondary | ICD-10-CM

## 2024-02-10 DIAGNOSIS — Z0000 Encounter for general adult medical examination without abnormal findings: Secondary | ICD-10-CM | POA: Diagnosis not present

## 2024-02-10 DIAGNOSIS — I10 Essential (primary) hypertension: Secondary | ICD-10-CM

## 2024-02-10 DIAGNOSIS — R739 Hyperglycemia, unspecified: Secondary | ICD-10-CM | POA: Diagnosis not present

## 2024-02-10 DIAGNOSIS — K5909 Other constipation: Secondary | ICD-10-CM | POA: Diagnosis not present

## 2024-02-10 DIAGNOSIS — E782 Mixed hyperlipidemia: Secondary | ICD-10-CM | POA: Diagnosis not present

## 2024-02-10 DIAGNOSIS — I700 Atherosclerosis of aorta: Secondary | ICD-10-CM | POA: Diagnosis not present

## 2024-02-10 NOTE — Progress Notes (Unsigned)
 Complete physical exam   Patient: Sarah Todd   DOB: 07/29/1946   78 y.o. Female  MRN: [id number] Visit Date: 02/10/2024  Today's healthcare provider: Aden Agreste, MD   Chief Complaint  Patient presents with  . Annual Exam    Diet -  General well balanced Exercise -  two times weekly for 90 minutes at the [employer] [facility] - well Sleeping - well with help of tylenol  pm nightly Concerns - none    Subjective    Sarah Todd is a 78 y.o. female who presents today for a complete physical exam.   Discussed the use of AI scribe software for clinical note transcription with the patient, who gave verbal consent to proceed.  History of Present Illness            Last depression screening scores    12/29/2023    2:05 PM 11/12/2023    2:06 PM 11/10/2023    2:05 PM  PHQ 2/9 Scores  PHQ - 2 Score 0 0 0  PHQ- 9 Score 3 0    Last fall risk screening    12/29/2023    2:05 PM  Fall Risk   Falls in the past year? 0  Number falls in past yr: 0  Injury with Fall? 0  Risk for fall due to : No Fall Risks    {VISON DENTAL STD PSA (Optional):27386}  {History (Optional):23778}  Medications: Outpatient Medications Prior to Visit  Medication Sig  . acetaminophen  (TYLENOL ) 650 MG CR tablet Take 2,600 mg by mouth daily as needed for pain.  . aspirin  EC 81 MG tablet Take 1 tablet (81 mg total) by mouth daily. Swallow whole.  . cycloSPORINE (RESTASIS) 0.05 % ophthalmic emulsion Place 1 drop into both eyes 2 (two) times daily.  . diphenhydramine -acetaminophen  (TYLENOL  PM) 25-500 MG TABS tablet Take 1 tablet by mouth at bedtime.  . estradiol  (ESTRACE ) 0.5 MG tablet TAKE ONE TABLET EVERY DAY  . gabapentin  (NEURONTIN ) 600 MG tablet Take 600-900 mg by mouth See admin instructions. Take 900 mg by mouth in the morning and 900 mg at night  . hydrocortisone  2.5 % cream Apply 1 Application topically 2 (two) times daily as needed (irritation).  . losartan  (COZAAR ) 100 MG tablet  Take 1 tablet (100 mg total) by mouth daily.  . metoprolol  succinate (TOPROL -XL) 25 MG 24 hr tablet TAKE 1 TABLET BY MOUTH DAILY  . Multiple Vitamin (MULTIVITAMIN) capsule Take 1 capsule by mouth daily.   . Omega-3 Fatty Acids (FISH OIL) 1000 MG CAPS Take 1,000-2,000 mg by mouth See admin instructions. Take 1000 mg in the morning and 2000 mg at night  . Probiotic Product (PROBIOTIC PO) Take 1 capsule by mouth daily.  . rosuvastatin  (CRESTOR ) 20 MG tablet Take 1 tablet (20 mg total) by mouth daily.  . triamcinolone  cream (KENALOG ) 0.1 % Apply 1 application topically 2 (two) times daily as needed.  Sarah Todd White Petrolatum-Mineral Oil (GENTEAL TEARS NIGHT-TIME) OINT Apply to eye.  . Coenzyme Q10 (COQ-10) 100 MG capsule Take 1 capsule (100 mg total) by mouth daily.  . doxycycline  (VIBRA -TABS) 100 MG tablet Take 1 tablet (100 mg total) by mouth 2 (two) times daily.  Sarah Todd FIBER PO Take by mouth.   No facility-administered medications prior to visit.    Review of Systems {Insert previous labs (optional):23779} {See past labs  Heme  Chem  Endocrine  Serology  Results Review (optional):1}  Objective    BP 125/74 (BP Location:  Left Arm, Patient Position: Sitting, Cuff Size: Normal)   Pulse 73   Ht 5\' 2"  (1.575 m)   Wt 147 lb 8 oz (66.9 kg)   SpO2 98%   BMI 26.98 kg/m  {Insert last BP/Wt (optional):23777}{See vitals history (optional):1}  Physical Exam Vitals reviewed.  Constitutional:      General: She is not in acute distress.    Appearance: Normal appearance. She is well-developed. She is not diaphoretic.  HENT:     Head: Normocephalic and atraumatic.     Right Ear: External ear normal.     Left Ear: External ear normal.     Nose: Nose normal.     Mouth/Throat:     Mouth: Mucous membranes are moist.     Pharynx: Oropharynx is clear.  Eyes:     General: No scleral icterus.    Conjunctiva/sclera: Conjunctivae normal.     Pupils: Pupils are equal, round, and reactive to light.  Neck:      Thyroid : No thyromegaly.  Cardiovascular:     Rate and Rhythm: Normal rate and regular rhythm.     Heart sounds: Normal heart sounds. No murmur heard. Pulmonary:     Effort: Pulmonary effort is normal. No respiratory distress.     Breath sounds: Normal breath sounds. No wheezing or rales.  Abdominal:     General: There is no distension.     Palpations: Abdomen is soft.     Tenderness: There is no abdominal tenderness.  Musculoskeletal:        General: No deformity.     Cervical back: Neck supple.     Right lower leg: No edema.     Left lower leg: No edema.  Lymphadenopathy:     Cervical: No cervical adenopathy.  Skin:    General: Skin is warm and dry.     Findings: No rash.  Neurological:     Mental Status: She is alert and oriented to person, place, and time. Mental status is at baseline.     Gait: Gait normal.  Psychiatric:        Mood and Affect: Mood normal.        Behavior: Behavior normal.        Thought Content: Thought content normal.     No results found for any visits on 02/10/24.  Assessment & Plan    Routine Health Maintenance and Physical Exam  Exercise Activities and Dietary recommendations  Goals     . care coordination activities     Care Coordination Interventions: Case Management Program offered and discussed SDOH screening completed Confirmed patient's next scheduled visit with her PCP 06/06/22 Annual Wellness Visit completed on 10/24/21 Patient declined having any nursing or community resource needs at this time but will discuss program with her provider on 06/06/22     . Patient Stated     Maintain current health and activity level        Immunization History  Administered Date(s) Administered  . Fluad Quad(high Dose 65+) 06/06/2022  . Influenza,inj,Quad PF,6+ Mos 06/15/2015  . Influenza-Unspecified 05/13/2017, 06/09/2018, 05/18/2019, 07/28/2020, 07/27/2021, 08/05/2023  . Moderna Covid-19 Fall Seasonal Vaccine 82yrs & older 08/05/2023   . Moderna Covid-19 Vaccine Bivalent Booster 23yrs & up 03/01/2021, 07/27/2021  . Moderna Sars-Covid-2 Vaccination 10/30/2019, 11/27/2019, 08/11/2020  . Pneumococcal Conjugate-13 11/17/2014  . Pneumococcal Polysaccharide-23 08/03/2013  . Respiratory Syncytial Virus Vaccine,Recomb Aduvanted(Arexvy) 07/16/2022  . Tdap 10/16/2016  . Zoster Recombinant(Shingrix) 09/09/2018, 03/09/2019    Health Maintenance  Topic Date Due  .  COVID-19 Vaccine (7 - Moderna risk 2024-25 season) 02/03/2024  . INFLUENZA VACCINE  04/02/2024  . MAMMOGRAM  04/20/2024  . Medicare Annual Wellness (AWV)  11/11/2024  . Colonoscopy  01/24/2025  . DTaP/Tdap/Td (2 - Td or Tdap) 10/16/2026  . DEXA SCAN  12/19/2027  . Pneumonia Vaccine 81+ Years old  Completed  . Zoster Vaccines- Shingrix  Completed  . HPV VACCINES  Aged Out  . Meningococcal B Vaccine  Aged Out  . Hepatitis C Screening  Discontinued    Discussed health benefits of physical activity, and encouraged her to engage in regular exercise appropriate for her age and condition.  Problem List Items Addressed This Visit   None               No follow-ups on file.     Aden Agreste, MD  [facility] [phone] (phone) [phone] (fax)  [facility]

## 2024-02-10 NOTE — Assessment & Plan Note (Signed)
Was diagnosed with moderate OSA by Pulm Was unable to tolerate CPAP with full face mask - she states they would not give her a nasal pillows mask at [place] Denies any symptoms other than chronic nocturia x2-3 May consider in lab sleep study in the future

## 2024-02-11 ENCOUNTER — Ambulatory Visit: Payer: Self-pay | Admitting: Family Medicine

## 2024-02-11 LAB — COMPREHENSIVE METABOLIC PANEL WITH GFR
ALT: 20 IU/L (ref 0–32)
AST: 29 IU/L (ref 0–40)
Albumin: 4.7 g/dL (ref 3.8–4.8)
Alkaline Phosphatase: 45 IU/L (ref 44–121)
BUN/Creatinine Ratio: 13 (ref 12–28)
BUN: 9 mg/dL (ref 8–27)
Bilirubin Total: 0.4 mg/dL (ref 0.0–1.2)
CO2: 20 mmol/L (ref 20–29)
Calcium: 9.9 mg/dL (ref 8.7–10.3)
Chloride: 98 mmol/L (ref 96–106)
Creatinine, Ser: 0.71 mg/dL (ref 0.57–1.00)
Globulin, Total: 2.6 g/dL (ref 1.5–4.5)
Glucose: 91 mg/dL (ref 70–99)
Potassium: 4.6 mmol/L (ref 3.5–5.2)
Sodium: 136 mmol/L (ref 134–144)
Total Protein: 7.3 g/dL (ref 6.0–8.5)
eGFR: 86 mL/min/{1.73_m2} (ref >59)

## 2024-02-11 LAB — LIPID PANEL WITH LDL/HDL RATIO
Cholesterol, Total: 151 mg/dL (ref 100–199)
HDL: 68 mg/dL (ref >39)
LDL Chol Calc (NIH): 55 mg/dL (ref 0–99)
LDL/HDL Ratio: 0.8 ratio (ref 0.0–3.2)
Triglycerides: 168 mg/dL — ABNORMAL HIGH (ref 0–149)
VLDL Cholesterol Cal: 28 mg/dL (ref 5–40)

## 2024-02-11 LAB — HEMOGLOBIN A1C
Est. average glucose Bld gHb Est-mCnc: 117 mg/dL
Hgb A1c MFr Bld: 5.7 % — ABNORMAL HIGH (ref 4.8–5.6)

## 2024-02-11 NOTE — Assessment & Plan Note (Signed)
 Blood pressure is well-controlled with losartan  100 mg daily and metoprolol  25 mg daily. Blood pressure readings are excellent. - Continue losartan  100 mg daily. - Continue metoprolol  25 mg daily. - Schedule follow-up in 6 months for blood pressure monitoring.

## 2024-02-11 NOTE — Assessment & Plan Note (Signed)
 Chronic constipation managed with Dulcolax and prunes. Fiber supplements were ineffective. Family history of colon cancer, with mother diagnosed in her 27s. Discussed colonoscopy risks and benefits after age 80, including increased procedural risks and decreased benefits.  - Consider gastroenterology referral next year for colonoscopy discussion due to family history of colon cancer.

## 2024-02-11 NOTE — Assessment & Plan Note (Signed)
 Continue statin.

## 2024-02-11 NOTE — Assessment & Plan Note (Signed)
 Cholesterol levels are managed with rosuvastatin  20 mg daily. No reported side effects from statin therapy. - Continue rosuvastatin  20 mg daily.

## 2024-02-12 ENCOUNTER — Ambulatory Visit: Payer: Self-pay

## 2024-02-12 NOTE — Telephone Encounter (Signed)
  FYI Only or Action Required?: FYI only for provider  Patient was last seen in primary care on 02/10/2024 by Mazie Speed, MD. Called Nurse Triage reporting Results. Symptoms began today. Interventions attempted: Nothing. Symptoms are: stable.  Triage Disposition: No disposition on file.  Patient/caregiver understands and will follow disposition?:   Copied from CRM [id number]. Topic: Clinical - Lab/Test Results >> Feb 12, 2024  3:43 PM Zipporah Him wrote: Reason for CRM: Go over lab results

## 2024-02-17 ENCOUNTER — Other Ambulatory Visit: Payer: Self-pay | Admitting: Family Medicine

## 2024-02-19 NOTE — Telephone Encounter (Signed)
 Requested Prescriptions  Pending Prescriptions Disp Refills   estradiol  (ESTRACE ) 0.5 MG tablet [Pharmacy Med Name: ESTRADIOL  0.5 MG TAB] 90 tablet 2    Sig: TAKE ONE TABLET EVERY DAY     OB/GYN:  Estrogens Passed - 02/19/2024  2:37 PM      Passed - Mammogram is up-to-date per Health Maintenance      Passed - Last BP in normal range    BP Readings from Last 1 Encounters:  02/10/24 125/74         Passed - Valid encounter within last 12 months    Recent Outpatient Visits           1 week ago Encounter for annual physical exam   [facility] [place], Stan Eans, MD   1 month ago Rhinosinusitis   [facility] Lamon Pillow, MD   3 months ago Acute non-recurrent pansinusitis   [facility] [place], Stan Eans, MD       Future Appointments             In 11 months Bacigalupo, Stan Eans, MD [facility]

## 2024-04-19 ENCOUNTER — Other Ambulatory Visit: Payer: Self-pay | Admitting: Nurse Practitioner

## 2024-04-21 ENCOUNTER — Ambulatory Visit
Admission: RE | Admit: 2024-04-21 | Discharge: 2024-04-21 | Disposition: A | Discharge status: Home / Self Care | Source: Ambulatory Visit | Attending: Family Medicine | Admitting: Family Medicine

## 2024-04-21 DIAGNOSIS — Z1231 Encounter for screening mammogram for malignant neoplasm of breast: Secondary | ICD-10-CM | POA: Insufficient documentation

## 2024-05-17 ENCOUNTER — Ambulatory Visit: Payer: Self-pay | Admitting: Family Medicine

## 2024-05-17 ENCOUNTER — Other Ambulatory Visit: Payer: Self-pay | Admitting: Family Medicine

## 2024-05-17 MED ORDER — HYDROCORTISONE 2.5 % EX CREA: 1.0000 | TOPICAL_CREAM | Freq: Two times a day (BID) | CUTANEOUS | 2 refills | Status: AC | PRN

## 2024-05-17 NOTE — Telephone Encounter (Signed)
 FYI Only or Action Required?: Action required by provider: medication refill request.  Patient was last seen in primary care on 02/10/2024 by Myrla Jon HERO, MD.  Called Nurse Triage reporting Vaginal Itching.  Symptoms began n/a.  Interventions attempted: Other: n/a.  Symptoms are: n/a.  Triage Disposition: Information or Advice Only Call  Patient/caregiver understands and will follow disposition?: Yes   Reason for Disposition  Health information question, no triage required and triager able to answer question  Answer Assessment - Initial Assessment Questions Patient denying any symptoms, likes to have medication on hand in case. Pharmacy advised patient to call PCP for refill. [facility] on file.   1. REASON FOR CALL: What is the main reason for your call? or How can I best help you?     Looking for a refill of Hydrocortisone  2.5% cream  2. SYMPTOMS : Do you have any symptoms?      No  Protocols used: Information Only Call - No Triage-A-AH    This RN made the 1st attempt to contact patient. No answer, left voicemail with callback number.       Copied from CRM [id number]. Topic: Clinical - Medical Advice >> May 17, 2024 11:45 AM Tiffany B wrote: Reason for CRM: Patient requesting hydrocortisone  2.5 % cream, patient was experiencing vaginal itching and now it out. Patient would like to keep cream on hand and was advised by the pharmacy to contact PCP and request a rx.   [facility] - [place], [place] - [address]  Phone: [phone] Fax: [phone]

## 2024-05-17 NOTE — Telephone Encounter (Signed)
 This RN made the 1st attempt to contact patient. No answer, left voicemail with callback number.       Copied from CRM [id number]. Topic: Clinical - Medical Advice >> May 17, 2024 11:45 AM Tiffany B wrote: Reason for CRM: Patient requesting hydrocortisone  2.5 % cream, patient was experiencing vaginal itching and now it out. Patient would like to keep cream on hand and was advised by the pharmacy to contact PCP and request a rx.   [facility] - [place], [place] - [address]  Phone: [phone] Fax: [phone]

## 2024-05-31 DIAGNOSIS — Z85828 Personal history of other malignant neoplasm of skin: Secondary | ICD-10-CM | POA: Diagnosis not present

## 2024-05-31 DIAGNOSIS — D0439 Carcinoma in situ of skin of other parts of face: Secondary | ICD-10-CM | POA: Diagnosis not present

## 2024-05-31 DIAGNOSIS — L820 Inflamed seborrheic keratosis: Secondary | ICD-10-CM | POA: Diagnosis not present

## 2024-05-31 DIAGNOSIS — D485 Neoplasm of uncertain behavior of skin: Secondary | ICD-10-CM | POA: Diagnosis not present

## 2024-05-31 DIAGNOSIS — L821 Other seborrheic keratosis: Secondary | ICD-10-CM | POA: Diagnosis not present

## 2024-05-31 DIAGNOSIS — Z08 Encounter for follow-up examination after completed treatment for malignant neoplasm: Secondary | ICD-10-CM | POA: Diagnosis not present

## 2024-06-08 ENCOUNTER — Other Ambulatory Visit: Payer: Self-pay | Admitting: Internal Medicine

## 2024-07-12 DIAGNOSIS — M961 Postlaminectomy syndrome, not elsewhere classified: Secondary | ICD-10-CM | POA: Diagnosis not present

## 2024-07-12 DIAGNOSIS — M5416 Radiculopathy, lumbar region: Secondary | ICD-10-CM | POA: Diagnosis not present

## 2024-07-19 ENCOUNTER — Ambulatory Visit: Admitting: Family Medicine

## 2024-07-19 ENCOUNTER — Encounter: Payer: Self-pay | Admitting: Family Medicine

## 2024-07-19 VITALS — BP 132/84 | HR 71 | Resp 14 | Ht 62.0 in | Wt 150.3 lb

## 2024-07-19 DIAGNOSIS — K5909 Other constipation: Secondary | ICD-10-CM

## 2024-07-19 DIAGNOSIS — E782 Mixed hyperlipidemia: Secondary | ICD-10-CM

## 2024-07-19 DIAGNOSIS — R7303 Prediabetes: Secondary | ICD-10-CM | POA: Diagnosis not present

## 2024-07-19 DIAGNOSIS — Z23 Encounter for immunization: Secondary | ICD-10-CM | POA: Diagnosis not present

## 2024-07-19 DIAGNOSIS — I10 Essential (primary) hypertension: Secondary | ICD-10-CM

## 2024-07-19 NOTE — Assessment & Plan Note (Signed)
 Cholesterol levels are well managed with current medication. - Continue rosuvastatin  20 mg daily

## 2024-07-19 NOTE — Assessment & Plan Note (Signed)
 Managed with fiber supplementation. Improvement noted with [place] fiber gummies. - Continue [place] fiber gummies

## 2024-07-19 NOTE — Assessment & Plan Note (Signed)
 A1c is slightly elevated at 5.7%, indicating prediabetes. No immediate intervention required as it is just above the normal range. Emphasis on lifestyle modifications to prevent progression to diabetes. - Rechecked A1c with current labs - Encouraged reduction of sugar intake

## 2024-07-19 NOTE — Assessment & Plan Note (Signed)
 Blood pressure is well controlled with current medication regimen. - Continue losartan  100 mg daily - Continue metoprolol  XL 25 mg daily

## 2024-07-19 NOTE — Progress Notes (Signed)
 Established patient visit   Patient: Sarah Todd   DOB: [birth year]   78 y.o. Female  MRN: [id number] Visit Date: 07/19/2024  Today's healthcare provider: Jon Eva, MD   Chief Complaint  Patient presents with   Medical Management of Chronic Issues    6 month f/u    Subjective    HPI HPI     Medical Management of Chronic Issues    Additional comments: 6 month f/u       Last edited by Wilfred Hargis RAMAN, CMA on 07/19/2024  9:47 AM.       Discussed the use of AI scribe software for clinical note transcription with the patient, who gave verbal consent to proceed.  History of Present Illness   Sarah Todd is an 78 year old female with hypertension and hyperlipidemia who presents for a six-month chronic disease follow-up.  She is on losartan  100 mg daily and metoprolol  XL 25 mg daily for hypertension. For hyperlipidemia, she takes rosuvastatin  20 mg daily. Her recent lab results show an A1c of 5.7%, indicating prediabetes. She acknowledges the need to monitor her sugar intake.  She experiences constipation and has added a [place] gummy fiber supplement to her regimen two weeks ago, with occasional use of a laxative as needed.  She has a past reaction to Aspercreme, attributed to heat from an Ace bandage, and has no allergies to the flu shot.         Medications: Outpatient Medications Prior to Visit  Medication Sig   acetaminophen  (TYLENOL ) 650 MG CR tablet Take 2,600 mg by mouth daily as needed for pain.   aspirin  EC 81 MG tablet Take 1 tablet (81 mg total) by mouth daily. Swallow whole.   cycloSPORINE (RESTASIS) 0.05 % ophthalmic emulsion Place 1 drop into both eyes 2 (two) times daily.   estradiol  (ESTRACE ) 0.5 MG tablet TAKE ONE TABLET EVERY DAY   FIBER GUMMIES PO Take by mouth in the morning, at noon, in the evening, and at bedtime.   gabapentin  (NEURONTIN ) 600 MG tablet Take 600-900 mg by mouth See admin instructions. Take 900 mg  by mouth in the morning and 900 mg at night   hydrocortisone  2.5 % cream Apply 1 Application topically 2 (two) times daily as needed (irritation).   losartan  (COZAAR ) 100 MG tablet Take 1 tablet (100 mg total) by mouth daily.   metoprolol  succinate (TOPROL -XL) 25 MG 24 hr tablet TAKE 1 TABLET BY MOUTH DAILY   Multiple Vitamin (MULTIVITAMIN) capsule Take 1 capsule by mouth daily.    Omega-3 Fatty Acids (FISH OIL) 1000 MG CAPS Take 1,000-2,000 mg by mouth See admin instructions. Take 1000 mg in the morning and 2000 mg at night   rosuvastatin  (CRESTOR ) 20 MG tablet Take 1 tablet (20 mg total) by mouth daily.   triamcinolone  cream (KENALOG ) 0.1 % Apply 1 application topically 2 (two) times daily as needed.   White Petrolatum-Mineral Oil (GENTEAL TEARS NIGHT-TIME) OINT Apply to eye.   [DISCONTINUED] diphenhydramine -acetaminophen  (TYLENOL  PM) 25-500 MG TABS tablet Take 1 tablet by mouth at bedtime.   [DISCONTINUED] Probiotic Product (PROBIOTIC PO) Take 1 capsule by mouth daily.   No facility-administered medications prior to visit.    Review of Systems     Objective    BP 132/84   Pulse 71   Resp 14   Ht 5' 2 (1.575 m)   Wt 150 lb 4.8 oz (68.2 kg)   BMI 27.49 kg/m  Physical Exam Vitals reviewed.  Constitutional:      General: She is not in acute distress.    Appearance: Normal appearance. She is well-developed. She is not diaphoretic.  HENT:     Head: Normocephalic and atraumatic.  Eyes:     General: No scleral icterus.    Conjunctiva/sclera: Conjunctivae normal.  Neck:     Thyroid : No thyromegaly.  Cardiovascular:     Rate and Rhythm: Normal rate and regular rhythm.     Heart sounds: Normal heart sounds. No murmur heard. Pulmonary:     Effort: Pulmonary effort is normal. No respiratory distress.     Breath sounds: Normal breath sounds. No wheezing, rhonchi or rales.  Musculoskeletal:     Cervical back: Neck supple.     Right lower leg: No edema.     Left lower leg: No  edema.  Lymphadenopathy:     Cervical: No cervical adenopathy.  Skin:    General: Skin is warm and dry.     Findings: No rash.  Neurological:     Mental Status: She is alert and oriented to person, place, and time. Mental status is at baseline.  Psychiatric:        Mood and Affect: Mood normal.        Behavior: Behavior normal.      No results found for any visits on 07/19/24.  Assessment & Plan     Problem List Items Addressed This Visit       Cardiovascular and Mediastinum   Essential hypertension - Primary   Blood pressure is well controlled with current medication regimen. - Continue losartan  100 mg daily - Continue metoprolol  XL 25 mg daily      Relevant Orders   Comprehensive metabolic panel with GFR     Other   Mixed hyperlipidemia (Chronic)   Cholesterol levels are well managed with current medication. - Continue rosuvastatin  20 mg daily      Relevant Orders   Comprehensive metabolic panel with GFR   Lipid panel   Constipation   Managed with fiber supplementation. Improvement noted with [place] fiber gummies. - Continue Phillips fiber gummies      Prediabetes   A1c is slightly elevated at 5.7%, indicating prediabetes. No immediate intervention required as it is just above the normal range. Emphasis on lifestyle modifications to prevent progression to diabetes. - Rechecked A1c with current labs - Encouraged reduction of sugar intake      Relevant Orders   Hemoglobin A1c        General Health Maintenance Discussion about flu vaccination and routine lab work. - Administered flu shot - Ordered routine labs including cholesterol, kidney, and liver function tests       Return in about 6 months (around 01/16/2025) for CPE.       Jon Eva, MD  [facility] [phone] (phone) [phone] (fax)  [facility]

## 2024-07-20 ENCOUNTER — Ambulatory Visit: Payer: Self-pay | Admitting: Family Medicine

## 2024-07-20 LAB — LIPID PANEL
Chol/HDL Ratio: 1.9 ratio (ref 0.0–4.4)
Cholesterol, Total: 153 mg/dL (ref 100–199)
HDL: 81 mg/dL (ref >39)
LDL Chol Calc (NIH): 48 mg/dL (ref 0–99)
Triglycerides: 147 mg/dL (ref 0–149)
VLDL Cholesterol Cal: 24 mg/dL (ref 5–40)

## 2024-07-20 LAB — COMPREHENSIVE METABOLIC PANEL WITH GFR
ALT: 22 IU/L (ref 0–32)
AST: 25 IU/L (ref 0–40)
Albumin: 4.8 g/dL (ref 3.8–4.8)
Alkaline Phosphatase: 50 IU/L (ref 49–135)
BUN/Creatinine Ratio: 20 (ref 12–28)
BUN: 13 mg/dL (ref 8–27)
Bilirubin Total: 0.3 mg/dL (ref 0.0–1.2)
CO2: 25 mmol/L (ref 20–29)
Calcium: 10.6 mg/dL — ABNORMAL HIGH (ref 8.7–10.3)
Chloride: 97 mmol/L (ref 96–106)
Creatinine, Ser: 0.64 mg/dL (ref 0.57–1.00)
Globulin, Total: 2.3 g/dL (ref 1.5–4.5)
Glucose: 91 mg/dL (ref 70–99)
Potassium: 4.8 mmol/L (ref 3.5–5.2)
Sodium: 136 mmol/L (ref 134–144)
Total Protein: 7.1 g/dL (ref 6.0–8.5)
eGFR: 89 mL/min/1.73 (ref >59)

## 2024-07-20 LAB — HEMOGLOBIN A1C
Est. average glucose Bld gHb Est-mCnc: 117 mg/dL
Hgb A1c MFr Bld: 5.7 % — ABNORMAL HIGH (ref 4.8–5.6)

## 2024-07-23 ENCOUNTER — Telehealth: Payer: Self-pay | Admitting: Family Medicine

## 2024-07-23 NOTE — Telephone Encounter (Signed)
 Copied from CRM [id number]. Topic: General - Call Back - No Documentation >> Jul 23, 2024 11:52 AM Avram MATSU wrote: Reason for CRM: patient stated she had her labs read to her but need more clarification, please advise [phone]

## 2024-07-23 NOTE — Telephone Encounter (Signed)
 LVM requesting patient return call.

## 2024-07-26 NOTE — Telephone Encounter (Signed)
 Copied from CRM [id number]. Topic: Clinical - Lab/Test Results >> Jul 26, 2024 11:25 AM Avram MATSU wrote: Reason for CRM: patient would like a call back from a nurse regarding her blood work. Please advise [phone] (M)

## 2024-07-28 NOTE — Progress Notes (Signed)
 Cardiology Clinic Note   Date: 08/03/2024 ID: Lavell, Ridings 07-Feb-1946, MRN [id number]  Primary Cardiologist:  Lonni Hanson, MD  Chief Complaint   Sarah Todd is a 78 y.o. female who presents to the clinic today for routine follow up.   Patient Profile   Sarah Todd is followed by Dr. Hanson for the history outlined below.      Past medical history significant for: DOE/fatigue. LBBB. Echo 08/29/2021: EF 55 to 60%.  No RWMA.  Mild LVH.  Grade II DD.  Normal RV function.  Mild MR.  Aortic valve sclerosis without stenosis. Nuclear stress test 10/04/2021: No significant ischemia.  No EKG changes concerning for ischemia at peak stress toward recovery.  CT attenuation correction images with mild coronary calcification, aortic atherosclerosis.  Low risk scan. Coronary artery calcification. Hypertension. Hyperlipidemia. Lipid panel 07/19/2024: LDL 48, HDL 81, TG 147, total 153. GERD. Varicose veins. OSA. Not on CPAP.   In summary, patient was first evaluated by Dr. Hanson on 07/18/2021 for fatigue and shortness of breath at the request of Dr. Myrla.  Patient reported progressive fatigue and exertional dyspnea shortly after right hip surgery in April 2022.  She also reported feeling as though her heart was racing especially with lying down.  EKG demonstrated sinus tachycardia with LBBB (new since since late March).  Tracing at that time was notable for inferior and anterior Q waves and there was concern for heart failure/ischemic heart disease.  Patient was started on Toprol .  ED precautions provided.  Echo showed normal LV/RV function, Grade II DD.  Nuclear stress test was low risk study.  Mild coronary calcifications and aortic atherosclerosis was noted and rosuvastatin  was increased.   Patient was seen on 03/06/2022 for routine follow-up. Aspirin  was stopped by PCP secondary to GI discomfort. She reported continued mild fatigue with strenuous household activities.  She was instructed to restart aspirin  and if abdominal discomfort recurred she could stop it. No other medication changes were made.   Patient was last seen in the office by me on 07/18/2023 for routine follow-up.  She was doing well at that time with no cardiac complaints.  [facility]     History of Present Illness    Today, patient is doing well. Patient denies shortness of breath, dyspnea on exertion, lower extremity edema, orthopnea or PND. No chest pain, pressure, or tightness. No palpitations. She was recently told she is prediabetic and should follow a low carb diet. We discussed strategies for decreasing carbs. [facility]. She reports grouping home activities with rest breaks with good tolerance.      ROS: All other systems reviewed and are otherwise negative except as noted in History of Present Illness.  EKGs/Labs Reviewed    EKG Interpretation Date/Time:  Tuesday August 03 2024 15:04:18 EST Ventricular Rate:  82 PR Interval:  174 QRS Duration:  110 QT Interval:  378 QTC Calculation: 441 R Axis:   -10  Text Interpretation: Normal sinus rhythm Anterior infarct (cited on or before 01-Jan-2009) Marked ST abnormality, possible lateral subendocardial injury When compared with ECG of 18-Jul-2023 11:01, No significant change was found Confirmed by Loistine Sober [phone]) on 08/03/2024 3:16:11 PM   07/19/2024: ALT 22; AST 25; BUN 13; Creatinine, Ser 0.64; Potassium 4.8; Sodium 136     Physical Exam    VS:  BP 124/78 (BP Location: Left  Arm, Patient Position: Sitting, Cuff Size: Normal)   Pulse 82   Ht 5' 2 (1.575 m)   Wt 151 lb (68.5 kg)   SpO2 97%   BMI 27.62 kg/m  , BMI Body mass index is 27.62 kg/m.  GEN: Well nourished, well developed, in no acute distress. Neck: No JVD or carotid bruits. Cardiac:  RRR.  No murmur. No rubs or gallops.   Respiratory:   Respirations regular and unlabored. Clear to auscultation without rales, wheezing or rhonchi. GI: Soft, nontender, nondistended. Extremities: Radials/DP/PT 2+ and equal bilaterally. No clubbing or cyanosis. No edema   Skin: Warm and dry, no rash. Neuro: Strength intact.  Assessment & Plan   LBBB Echo December 2022 showed normal LV/RV function, Grade II DD.  Nuclear stress test February 2023 with no significant ischemia or EKG changes seen.  Patient denies chest pain, pressure or tightness. [facility] as well as performing home tasks with good tolerance.  -No further testing indicated at this time.  -Continue Toprol .   Hyperlipidemia/coronary artery calcification LDL 48 November 2025, at goal. -Continue aspirin , rosuvastatin .    Hypertension BP today 124/78. No headaches or dizziness reported.   -Continue Toprol  and losartan .  Disposition: Return in 1 year or sooner as needed.          Signed, Barnie HERO. Briea Mcenery, DNP, NP-C

## 2024-07-30 ENCOUNTER — Other Ambulatory Visit: Payer: Self-pay | Admitting: Student

## 2024-07-30 DIAGNOSIS — I10 Essential (primary) hypertension: Secondary | ICD-10-CM

## 2024-08-02 ENCOUNTER — Telehealth: Payer: Self-pay

## 2024-08-02 NOTE — Telephone Encounter (Signed)
 Copied from CRM [id number]. Topic: Clinical - Lab/Test Results >> Jul 26, 2024 11:25 AM Avram MATSU wrote: Reason for CRM: patient would like a call back from a nurse regarding her blood work. Please advise [phone] (M) >> Aug 02, 2024 11:49 AM Shanda MATSU wrote: Patient calling back with additional questions about lab results, is req a call back.

## 2024-08-02 NOTE — Telephone Encounter (Signed)
 LVMTCB. OK for e2c2 to clarify what questions patient has in regards to her labs as she viewed them online Seen by patient Sarah Todd on 08/02/2024 11:34 AM

## 2024-08-03 ENCOUNTER — Ambulatory Visit: Payer: Self-pay

## 2024-08-03 ENCOUNTER — Encounter: Payer: Self-pay | Admitting: Student

## 2024-08-03 ENCOUNTER — Ambulatory Visit: Attending: Student | Admitting: Student

## 2024-08-03 VITALS — BP 124/78 | HR 82 | Ht 62.0 in | Wt 151.0 lb

## 2024-08-03 DIAGNOSIS — I447 Left bundle-branch block, unspecified: Secondary | ICD-10-CM | POA: Diagnosis not present

## 2024-08-03 DIAGNOSIS — I2510 Atherosclerotic heart disease of native coronary artery without angina pectoris: Secondary | ICD-10-CM

## 2024-08-03 DIAGNOSIS — I10 Essential (primary) hypertension: Secondary | ICD-10-CM | POA: Diagnosis not present

## 2024-08-03 DIAGNOSIS — E785 Hyperlipidemia, unspecified: Secondary | ICD-10-CM

## 2024-08-03 NOTE — Telephone Encounter (Signed)
 LMTCB-Ok for E2C2 nurse to give provider's message to patient.

## 2024-08-03 NOTE — Telephone Encounter (Signed)
  FYI Only or Action Required?: Action required by provider: clinical question for provider.  Patient was last seen in primary care on 07/19/2024 by Myrla Jon HERO, MD.  Called Nurse Triage reporting Labs Only.    Triage Disposition: Call PCP Within 24 Hours  Patient/caregiver understands and will follow disposition?: Yes          Copied from CRM #[id number]. Topic: Clinical - Lab/Test Results >> Aug 03, 2024 10:24 AM Tinnie BROCKS wrote: Reason for CRM: Pt has further questions on lab results and would like a call back at #[phone]. Reason for Disposition  [1] Caller requests to speak ONLY to PCP AND [2] NON-URGENT question  Answer Assessment - Initial Assessment Questions 1. REASON FOR CALL or QUESTION: What is your reason for calling today? or How can I best    Patient called in with questions regarding 11/17 A1c labs. She wants information on a low carb diet. Nutritional information provided on low carb diet. She wants the provider to know that she is no longer taking an additional calcium  supplement. But wants to know if she should continue taking her multi-vitamin which has calcium ? Please advise.  Protocols used: PCP Call - No Triage-A-AH

## 2024-08-03 NOTE — Telephone Encounter (Signed)
 Please see new encounter

## 2024-08-03 NOTE — Patient Instructions (Signed)
 Medication Instructions:   Your physician recommends that you continue on your current medications as directed. Please refer to the Current Medication list given to you today.    *If you need a refill on your cardiac medications before your next appointment, please call your pharmacy*  Lab Work:  None ordered at this time   If you have labs (blood work) drawn today and your tests are completely normal, you will receive your results only by:  MyChart Message (if you have MyChart) OR  A paper copy in the mail If you have any lab test that is abnormal or we need to change your treatment, we will call you to review the results.  Testing/Procedures:  None ordered at this time   Referrals:  None ordered at this time   Follow-Up:  At [facility], you and your health needs are our priority.  As part of our continuing mission to provide you with exceptional heart care, our providers are all part of one team.  This team includes your primary Cardiologist (physician) and Advanced Practice Providers or APPs (Physician Assistants and Nurse Practitioners) who all work together to provide you with the care you need, when you need it.  Your next appointment:   1 year(s)  Provider:    Barnie Hila, NP    We recommend signing up for the patient portal called MyChart.  Sign up information is provided on this After Visit Summary.  MyChart is used to connect with patients for Virtual Visits (Telemedicine).  Patients are able to view lab/test results, encounter notes, upcoming appointments, etc.  Non-urgent messages can be sent to your provider as well.   To learn more about what you can do with MyChart, go to [email or web address].

## 2024-08-03 NOTE — Telephone Encounter (Signed)
 Yes - hold MVI with Ca as well before it is rechecked.

## 2024-08-08 ENCOUNTER — Other Ambulatory Visit: Payer: Self-pay

## 2024-08-08 ENCOUNTER — Emergency Department

## 2024-08-08 ENCOUNTER — Encounter: Payer: Self-pay | Admitting: Emergency Medicine

## 2024-08-08 ENCOUNTER — Emergency Department
Admission: EM | Admit: 2024-08-08 | Discharge: 2024-08-08 | Disposition: A | Discharge status: Home / Self Care | Attending: Emergency Medicine | Admitting: Emergency Medicine

## 2024-08-08 DIAGNOSIS — I10 Essential (primary) hypertension: Secondary | ICD-10-CM | POA: Diagnosis not present

## 2024-08-08 DIAGNOSIS — M7989 Other specified soft tissue disorders: Secondary | ICD-10-CM | POA: Diagnosis not present

## 2024-08-08 DIAGNOSIS — S9032XA Contusion of left foot, initial encounter: Secondary | ICD-10-CM | POA: Diagnosis not present

## 2024-08-08 DIAGNOSIS — S93402A Sprain of unspecified ligament of left ankle, initial encounter: Secondary | ICD-10-CM

## 2024-08-08 DIAGNOSIS — S8012XA Contusion of left lower leg, initial encounter: Secondary | ICD-10-CM

## 2024-08-08 MED ORDER — TRAMADOL HCL 50 MG PO TABS: 50.0000 mg | ORAL_TABLET | Freq: Four times a day (QID) | ORAL | 0 refills | Status: AC | PRN

## 2024-08-08 MED ORDER — OXYCODONE HCL 5 MG PO TABS
5.0000 mg | ORAL_TABLET | Freq: Once | ORAL | Status: AC
  Administered 2024-08-08: 5 mg via ORAL
  Filled 2024-08-08: qty 1

## 2024-08-08 NOTE — Discharge Instructions (Signed)
 Rest, ice, and elevate your left leg often throughout the day.  If your symptoms are not improving over the week, please follow-up with your [facility] care provider.

## 2024-08-08 NOTE — ED Provider Notes (Signed)
 [facility] Provider Note    Event Date/Time   First MD Initiated Contact with Patient 08/08/24 2112     (approximate)   History   Leg Injury   HPI  Sarah Todd is a 78 y.o. female with history of hypertension, hyperlipidemia, bursitis of the left hip and as listed in EMR presents to the emergency department for treatment and evaluation of pain in the left lower leg and ankle after slipping this morning.  She was walking down her porch steps that had [place] on then and slipped.  She did not hit her head or lose consciousness and she is not on any anticoagulants.  She did not have any pain initially but throughout the day left lower extremity has been more painful and now it is difficult to bear weight.  She has started to notice bruising to the lower leg as well.  She denies knee pain or hip pain.     Physical Exam    Vitals:   08/08/24 2054  BP: 131/72  Pulse: 88  Resp: 16  Temp: 97.9 F (36.6 C)  SpO2: 98%    General: Awake, no distress. Oriented x 3. CV:  Good peripheral perfusion.  Resp:  Normal effort.  Abd:  No distention.  Other:  Contusion noted over the pretibial left lower extremity and dorsal aspect of the left foot.  DP and PT pulses are 2+.  She has normal motor and sensation of the lower extremity.  No deformity noted.  Mild edema over the lateral aspect of the left ankle.  No open wounds   ED Results / Procedures / Treatments   Labs (all labs ordered are listed, but only abnormal results are displayed)  Labs Reviewed - No data to display   EKG  Not indicated   RADIOLOGY  Image and radiology report reviewed and interpreted by me. Radiology report consistent with the same.  Image of the left tibia/fibula, left foot, and left ankle are all negative for acute concerns.  PROCEDURES:  Critical Care performed: No  Procedures   MEDICATIONS ORDERED IN ED:  Medications  oxyCODONE  (Oxy IR/ROXICODONE ) immediate  release tablet 5 mg (5 mg Oral Given 08/08/24 2138)     IMPRESSION / MDM / ASSESSMENT AND PLAN / ED COURSE   I have reviewed the triage note and vital signs. Vital signs are stable   Differential diagnosis includes, but is not limited to, tib-fib fracture, malleolus fracture, midfoot fracture  Patient's presentation is most consistent with acute illness / injury with system symptoms.  78 year old well appearing and active female presenting to the emergency department after she slipped and fell while walking down her steps this morning.  See HPI for further details.  On exam, she does have contusions over the pretibial surface of the left lower extremity and left foot.  Motor and sensory function is intact.  No obvious deformities.  Imaging of the left tibia/fibula, left foot, and left ankle demonstrate no acute bony abnormality.  I did consider getting images of the head and cervical spine however the injury occurred over 12 hours ago and she has had no symptoms of concern in regards to head injury.  Results discussed with the patient.  Plan will be to apply cam walking boot and have her follow-up with her primary care provider if she is not improving over the week.  ER return precautions discussed.      FINAL CLINICAL IMPRESSION(S) / ED DIAGNOSES   Final diagnoses:  Contusion of multiple sites of left lower extremity, initial encounter  Sprain of left ankle, unspecified ligament, initial encounter     Rx / DC Orders   ED Discharge Orders          Ordered    traMADol  (ULTRAM ) 50 MG tablet  Every 6 hours PRN        08/08/24 2145             Note:  This document was prepared using Dragon voice recognition software and may include unintentional dictation errors.   Herlinda Kirk NOVAK, FNP 08/08/24 2146    Viviann Pastor, MD 08/08/24 [phone]

## 2024-08-08 NOTE — ED Triage Notes (Signed)
 Pt presents with left lower leg and ankle pain/swelling after a slip and fall earlier today, pt takes 81mg  aspirin , denies LOC and hitting head

## 2024-08-12 ENCOUNTER — Ambulatory Visit: Admitting: Family Medicine

## 2024-11-17 ENCOUNTER — Ambulatory Visit

## 2025-02-10 ENCOUNTER — Encounter: Admitting: Family Medicine
